# Patient Record
Sex: Female | Born: 1935
Health system: Southern US, Community
[De-identification: ages and names within clinical notes are randomized; demographics above are authoritative.]

## PROBLEM LIST (undated history)

## (undated) DIAGNOSIS — Z86010 Personal history of colonic polyps: Secondary | ICD-10-CM

## (undated) DIAGNOSIS — R7989 Other specified abnormal findings of blood chemistry: Secondary | ICD-10-CM

## (undated) DIAGNOSIS — B029 Zoster without complications: Secondary | ICD-10-CM

## (undated) DIAGNOSIS — Z972 Presence of dental prosthetic device (complete) (partial): Secondary | ICD-10-CM

## (undated) DIAGNOSIS — I2510 Atherosclerotic heart disease of native coronary artery without angina pectoris: Secondary | ICD-10-CM

## (undated) DIAGNOSIS — K219 Gastro-esophageal reflux disease without esophagitis: Secondary | ICD-10-CM

## (undated) DIAGNOSIS — K5790 Diverticulosis of intestine, part unspecified, without perforation or abscess without bleeding: Secondary | ICD-10-CM

## (undated) DIAGNOSIS — I219 Acute myocardial infarction, unspecified: Secondary | ICD-10-CM

## (undated) DIAGNOSIS — I4891 Unspecified atrial fibrillation: Secondary | ICD-10-CM

## (undated) DIAGNOSIS — I10 Essential (primary) hypertension: Secondary | ICD-10-CM

## (undated) DIAGNOSIS — I5032 Chronic diastolic (congestive) heart failure: Secondary | ICD-10-CM

## (undated) DIAGNOSIS — M1990 Unspecified osteoarthritis, unspecified site: Secondary | ICD-10-CM

## (undated) DIAGNOSIS — R945 Abnormal results of liver function studies: Secondary | ICD-10-CM

## (undated) DIAGNOSIS — E119 Type 2 diabetes mellitus without complications: Secondary | ICD-10-CM

## (undated) DIAGNOSIS — E785 Hyperlipidemia, unspecified: Secondary | ICD-10-CM

## (undated) HISTORY — DX: Zoster without complications: B02.9

## (undated) HISTORY — DX: Chronic diastolic (congestive) heart failure: I50.32

## (undated) HISTORY — DX: Personal history of colonic polyps: Z86.010

## (undated) HISTORY — DX: Type 2 diabetes mellitus without complications: E11.9

## (undated) HISTORY — DX: Diverticulosis of intestine, part unspecified, without perforation or abscess without bleeding: K57.90

## (undated) HISTORY — DX: Hypercalcemia: E83.52

## (undated) HISTORY — DX: Essential (primary) hypertension: I10

## (undated) HISTORY — DX: Hyperlipidemia, unspecified: E78.5

## (undated) HISTORY — DX: Abnormal results of liver function studies: R94.5

## (undated) HISTORY — PX: VAGINAL HYSTERECTOMY: SUR661

## (undated) HISTORY — DX: Other specified abnormal findings of blood chemistry: R79.89

## (undated) HISTORY — DX: Atherosclerotic heart disease of native coronary artery without angina pectoris: I25.10

---

## 1979-05-20 HISTORY — PX: SHOULDER OPEN ROTATOR CUFF REPAIR: SHX2407

## 1987-09-19 DIAGNOSIS — I219 Acute myocardial infarction, unspecified: Secondary | ICD-10-CM

## 1987-09-19 HISTORY — PX: CORONARY ANGIOPLASTY: SHX604

## 1987-09-19 HISTORY — DX: Acute myocardial infarction, unspecified: I21.9

## 2001-07-15 ENCOUNTER — Other Ambulatory Visit: Admission: RE | Admit: 2001-07-15 | Discharge: 2001-07-15 | Payer: Self-pay | Admitting: Family Medicine

## 2002-03-18 DIAGNOSIS — M810 Age-related osteoporosis without current pathological fracture: Secondary | ICD-10-CM

## 2004-04-06 ENCOUNTER — Ambulatory Visit (HOSPITAL_COMMUNITY): Admission: RE | Admit: 2004-04-06 | Discharge: 2004-04-06 | Payer: Self-pay | Admitting: *Deleted

## 2004-05-11 ENCOUNTER — Other Ambulatory Visit: Payer: Self-pay

## 2004-07-25 ENCOUNTER — Ambulatory Visit: Payer: Self-pay | Admitting: Family Medicine

## 2004-08-25 ENCOUNTER — Other Ambulatory Visit: Admission: RE | Admit: 2004-08-25 | Discharge: 2004-08-25 | Payer: Self-pay | Admitting: Family Medicine

## 2004-08-25 ENCOUNTER — Ambulatory Visit: Payer: Self-pay | Admitting: Family Medicine

## 2004-10-14 ENCOUNTER — Ambulatory Visit: Payer: Self-pay | Admitting: Family Medicine

## 2004-10-26 ENCOUNTER — Ambulatory Visit: Payer: Self-pay | Admitting: Family Medicine

## 2004-11-16 LAB — HM DEXA SCAN

## 2005-02-01 ENCOUNTER — Ambulatory Visit: Payer: Self-pay | Admitting: Family Medicine

## 2005-05-10 ENCOUNTER — Ambulatory Visit: Payer: Self-pay | Admitting: Family Medicine

## 2005-08-28 ENCOUNTER — Ambulatory Visit: Payer: Self-pay | Admitting: Family Medicine

## 2005-09-12 ENCOUNTER — Ambulatory Visit: Payer: Self-pay | Admitting: Family Medicine

## 2005-10-27 ENCOUNTER — Ambulatory Visit: Payer: Self-pay | Admitting: Family Medicine

## 2005-11-02 ENCOUNTER — Ambulatory Visit: Payer: Self-pay | Admitting: Family Medicine

## 2006-05-01 ENCOUNTER — Ambulatory Visit: Payer: Self-pay | Admitting: Family Medicine

## 2006-07-11 ENCOUNTER — Ambulatory Visit: Payer: Self-pay | Admitting: Family Medicine

## 2006-08-18 ENCOUNTER — Encounter: Payer: Self-pay | Admitting: Family Medicine

## 2006-08-18 LAB — CONVERTED CEMR LAB

## 2006-08-29 ENCOUNTER — Ambulatory Visit: Payer: Self-pay | Admitting: Family Medicine

## 2006-09-21 ENCOUNTER — Ambulatory Visit: Payer: Self-pay | Admitting: Family Medicine

## 2006-10-19 ENCOUNTER — Encounter: Payer: Self-pay | Admitting: Family Medicine

## 2006-10-19 LAB — CONVERTED CEMR LAB: Microalbumin U total vol: NORMAL mg/L

## 2006-11-02 ENCOUNTER — Ambulatory Visit: Payer: Self-pay | Admitting: Family Medicine

## 2006-11-02 LAB — CONVERTED CEMR LAB
ALT: 28 units/L (ref 0–40)
AST: 31 units/L (ref 0–37)
Basophils Absolute: 0.1 10*3/uL (ref 0.0–0.1)
CO2: 33 meq/L — ABNORMAL HIGH (ref 19–32)
Calcium: 9.9 mg/dL (ref 8.4–10.5)
Cholesterol: 156 mg/dL (ref 0–200)
Eosinophils Absolute: 0.2 10*3/uL (ref 0.0–0.6)
Eosinophils Relative: 3.9 % (ref 0.0–5.0)
GFR calc Af Amer: 153 mL/min
Glucose, Bld: 139 mg/dL — ABNORMAL HIGH (ref 70–99)
HCT: 42.1 % (ref 36.0–46.0)
Lymphocytes Relative: 33.3 % (ref 12.0–46.0)
MCHC: 34.6 g/dL (ref 30.0–36.0)
MCV: 89.8 fL (ref 78.0–100.0)
Neutro Abs: 2.8 10*3/uL (ref 1.4–7.7)
Neutrophils Relative %: 51 % (ref 43.0–77.0)
Phosphorus: 3.5 mg/dL (ref 2.3–4.6)
Platelets: 328 10*3/uL (ref 150–400)
Potassium: 4.1 meq/L (ref 3.5–5.1)
RBC: 4.69 M/uL (ref 3.87–5.11)
TSH: 3.43 microintl units/mL (ref 0.35–5.50)
Total CHOL/HDL Ratio: 3.6
Triglycerides: 192 mg/dL — ABNORMAL HIGH (ref 0–149)
Vit D, 1,25-Dihydroxy: 35 (ref 20–57)
WBC: 5.6 10*3/uL (ref 4.5–10.5)

## 2006-11-06 ENCOUNTER — Ambulatory Visit: Payer: Self-pay | Admitting: Family Medicine

## 2006-12-10 ENCOUNTER — Encounter: Payer: Self-pay | Admitting: Family Medicine

## 2006-12-10 DIAGNOSIS — I10 Essential (primary) hypertension: Secondary | ICD-10-CM

## 2006-12-10 DIAGNOSIS — E7800 Pure hypercholesterolemia, unspecified: Secondary | ICD-10-CM

## 2006-12-11 ENCOUNTER — Ambulatory Visit: Payer: Self-pay | Admitting: Family Medicine

## 2007-01-21 ENCOUNTER — Ambulatory Visit: Payer: Self-pay | Admitting: Family Medicine

## 2007-01-22 LAB — CONVERTED CEMR LAB: Hgb A1c MFr Bld: 6.5 % — ABNORMAL HIGH (ref 4.6–6.0)

## 2007-03-05 ENCOUNTER — Encounter: Payer: Self-pay | Admitting: Family Medicine

## 2007-06-10 ENCOUNTER — Encounter: Payer: Self-pay | Admitting: Family Medicine

## 2007-07-26 ENCOUNTER — Ambulatory Visit: Payer: Self-pay | Admitting: Family Medicine

## 2007-07-29 LAB — CONVERTED CEMR LAB
ALT: 14 units/L (ref 0–35)
AST: 18 units/L (ref 0–37)
Albumin: 4.3 g/dL (ref 3.5–5.2)
BUN: 12 mg/dL (ref 6–23)
Calcium: 10 mg/dL (ref 8.4–10.5)
Chloride: 100 meq/L (ref 96–112)
Creatinine, Ser: 0.5 mg/dL (ref 0.4–1.2)
GFR calc Af Amer: 153 mL/min
GFR calc non Af Amer: 127 mL/min
Hgb A1c MFr Bld: 6.3 % — ABNORMAL HIGH (ref 4.6–6.0)
Sodium: 142 meq/L (ref 135–145)
VLDL: 24 mg/dL (ref 0–40)

## 2007-09-02 ENCOUNTER — Ambulatory Visit: Payer: Self-pay | Admitting: Family Medicine

## 2007-09-04 ENCOUNTER — Telehealth: Payer: Self-pay | Admitting: Family Medicine

## 2007-09-20 ENCOUNTER — Telehealth: Payer: Self-pay | Admitting: Family Medicine

## 2007-10-03 ENCOUNTER — Encounter: Payer: Self-pay | Admitting: Family Medicine

## 2007-10-18 ENCOUNTER — Encounter (INDEPENDENT_AMBULATORY_CARE_PROVIDER_SITE_OTHER): Payer: Self-pay | Admitting: *Deleted

## 2007-10-18 ENCOUNTER — Encounter: Payer: Self-pay | Admitting: Family Medicine

## 2007-10-22 ENCOUNTER — Encounter (INDEPENDENT_AMBULATORY_CARE_PROVIDER_SITE_OTHER): Payer: Self-pay | Admitting: *Deleted

## 2007-10-23 ENCOUNTER — Encounter (INDEPENDENT_AMBULATORY_CARE_PROVIDER_SITE_OTHER): Payer: Self-pay | Admitting: *Deleted

## 2007-11-25 ENCOUNTER — Telehealth: Payer: Self-pay | Admitting: Family Medicine

## 2007-12-11 ENCOUNTER — Encounter: Payer: Self-pay | Admitting: Family Medicine

## 2008-03-02 ENCOUNTER — Ambulatory Visit: Payer: Self-pay | Admitting: Family Medicine

## 2008-03-03 LAB — CONVERTED CEMR LAB
AST: 19 units/L (ref 0–37)
Creatinine,U: 19.5 mg/dL
Microalb Creat Ratio: 56.4 mg/g — ABNORMAL HIGH (ref 0.0–30.0)
Microalb, Ur: 1.1 mg/dL (ref 0.0–1.9)
VLDL: 20 mg/dL (ref 0–40)

## 2008-04-15 ENCOUNTER — Emergency Department: Payer: Self-pay | Admitting: Emergency Medicine

## 2008-04-15 ENCOUNTER — Telehealth: Payer: Self-pay | Admitting: Family Medicine

## 2008-04-16 ENCOUNTER — Telehealth: Payer: Self-pay | Admitting: Family Medicine

## 2008-04-18 ENCOUNTER — Emergency Department: Payer: Self-pay | Admitting: Emergency Medicine

## 2008-04-22 ENCOUNTER — Emergency Department: Payer: Self-pay | Admitting: Emergency Medicine

## 2008-04-29 ENCOUNTER — Emergency Department: Payer: Self-pay | Admitting: Emergency Medicine

## 2008-05-13 ENCOUNTER — Emergency Department: Payer: Self-pay | Admitting: Emergency Medicine

## 2008-08-26 ENCOUNTER — Ambulatory Visit: Payer: Self-pay | Admitting: Family Medicine

## 2008-08-27 LAB — CONVERTED CEMR LAB
ALT: 15 units/L (ref 0–35)
Alkaline Phosphatase: 36 units/L — ABNORMAL LOW (ref 39–117)
BUN: 14 mg/dL (ref 6–23)
Bilirubin, Direct: 0.1 mg/dL (ref 0.0–0.3)
Calcium: 10.2 mg/dL (ref 8.4–10.5)
Creatinine, Ser: 0.5 mg/dL (ref 0.4–1.2)
Eosinophils Relative: 3.1 % (ref 0.0–5.0)
GFR calc Af Amer: 153 mL/min
HCT: 40.6 % (ref 36.0–46.0)
HDL: 43 mg/dL (ref >39.0)
Monocytes Relative: 10.1 % (ref 3.0–12.0)
Neutrophils Relative %: 50.9 % (ref 43.0–77.0)
Phosphorus: 4.2 mg/dL (ref 2.3–4.6)
Platelets: 289 10*3/uL (ref 150–400)
RBC: 4.49 M/uL (ref 3.87–5.11)
Total CHOL/HDL Ratio: 3.3
Total Protein: 8.2 g/dL (ref 6.0–8.3)
VLDL: 27 mg/dL (ref 0–40)
WBC: 5.6 10*3/uL (ref 4.5–10.5)

## 2008-09-02 ENCOUNTER — Ambulatory Visit: Payer: Self-pay | Admitting: Family Medicine

## 2008-09-02 DIAGNOSIS — Z860100 Personal history of colon polyps, unspecified: Secondary | ICD-10-CM | POA: Insufficient documentation

## 2008-09-18 DIAGNOSIS — Z860101 Personal history of adenomatous and serrated colon polyps: Secondary | ICD-10-CM

## 2008-09-18 DIAGNOSIS — Z86010 Personal history of colonic polyps: Secondary | ICD-10-CM

## 2008-09-18 HISTORY — DX: Personal history of adenomatous and serrated colon polyps: Z86.0101

## 2008-09-18 HISTORY — DX: Personal history of colonic polyps: Z86.010

## 2008-10-09 ENCOUNTER — Encounter: Payer: Self-pay | Admitting: Family Medicine

## 2008-10-15 ENCOUNTER — Encounter (INDEPENDENT_AMBULATORY_CARE_PROVIDER_SITE_OTHER): Payer: Self-pay | Admitting: *Deleted

## 2008-10-20 ENCOUNTER — Telehealth: Payer: Self-pay | Admitting: Family Medicine

## 2008-10-21 ENCOUNTER — Encounter: Payer: Self-pay | Admitting: Family Medicine

## 2008-10-25 ENCOUNTER — Encounter: Payer: Self-pay | Admitting: Family Medicine

## 2008-11-12 ENCOUNTER — Ambulatory Visit: Payer: Self-pay | Admitting: Family Medicine

## 2008-11-12 DIAGNOSIS — K5732 Diverticulitis of large intestine without perforation or abscess without bleeding: Secondary | ICD-10-CM | POA: Insufficient documentation

## 2008-11-12 LAB — CONVERTED CEMR LAB
Bilirubin Urine: NEGATIVE
Blood in Urine, dipstick: NEGATIVE
Glucose, Urine, Semiquant: NEGATIVE
Protein, U semiquant: 30
pH: 6

## 2008-11-16 ENCOUNTER — Ambulatory Visit: Payer: Self-pay | Admitting: Family Medicine

## 2008-11-17 LAB — CONVERTED CEMR LAB
AST: 31 units/L (ref 0–37)
Alkaline Phosphatase: 43 units/L (ref 39–117)
BUN: 10 mg/dL (ref 6–23)
Basophils Absolute: 0 10*3/uL (ref 0.0–0.1)
Bilirubin, Direct: 0.1 mg/dL (ref 0.0–0.3)
Chloride: 99 meq/L (ref 96–112)
Eosinophils Absolute: 0.1 10*3/uL (ref 0.0–0.7)
Eosinophils Relative: 0.6 % (ref 0.0–5.0)
GFR calc non Af Amer: 73 mL/min
MCV: 89.2 fL (ref 78.0–100.0)
Neutrophils Relative %: 77.6 % — ABNORMAL HIGH (ref 43.0–77.0)
Platelets: 342 10*3/uL (ref 150–400)
Potassium: 4.1 meq/L (ref 3.5–5.1)
Sodium: 140 meq/L (ref 135–145)
Total Bilirubin: 0.9 mg/dL (ref 0.3–1.2)
WBC: 9.9 10*3/uL (ref 4.5–10.5)

## 2008-11-18 ENCOUNTER — Ambulatory Visit: Payer: Self-pay | Admitting: Internal Medicine

## 2008-11-18 ENCOUNTER — Encounter (INDEPENDENT_AMBULATORY_CARE_PROVIDER_SITE_OTHER): Payer: Self-pay | Admitting: *Deleted

## 2008-11-19 ENCOUNTER — Telehealth: Payer: Self-pay | Admitting: Family Medicine

## 2008-11-20 ENCOUNTER — Telehealth: Payer: Self-pay | Admitting: Family Medicine

## 2008-11-24 ENCOUNTER — Encounter: Payer: Self-pay | Admitting: Family Medicine

## 2008-11-25 ENCOUNTER — Ambulatory Visit: Payer: Self-pay | Admitting: Family Medicine

## 2008-11-26 LAB — FECAL OCCULT BLOOD, GUAIAC: Fecal Occult Blood: NEGATIVE

## 2008-11-27 ENCOUNTER — Ambulatory Visit: Payer: Self-pay | Admitting: Family Medicine

## 2008-12-02 ENCOUNTER — Encounter: Payer: Self-pay | Admitting: Family Medicine

## 2009-02-26 ENCOUNTER — Ambulatory Visit: Payer: Self-pay | Admitting: Family Medicine

## 2009-03-01 LAB — CONVERTED CEMR LAB
ALT: 13 units/L (ref 0–35)
Alkaline Phosphatase: 32 units/L — ABNORMAL LOW (ref 39–117)
Bilirubin, Direct: 0.1 mg/dL (ref 0.0–0.3)
CO2: 30 meq/L (ref 19–32)
Calcium: 9.8 mg/dL (ref 8.4–10.5)
Chloride: 106 meq/L (ref 96–112)
Potassium: 3.7 meq/L (ref 3.5–5.1)
Sodium: 142 meq/L (ref 135–145)
Total Bilirubin: 0.8 mg/dL (ref 0.3–1.2)
Total Protein: 7.3 g/dL (ref 6.0–8.3)

## 2009-03-02 ENCOUNTER — Ambulatory Visit: Payer: Self-pay | Admitting: Family Medicine

## 2009-03-02 DIAGNOSIS — E559 Vitamin D deficiency, unspecified: Secondary | ICD-10-CM

## 2009-03-29 ENCOUNTER — Encounter: Payer: Self-pay | Admitting: Family Medicine

## 2009-04-01 ENCOUNTER — Telehealth: Payer: Self-pay | Admitting: Family Medicine

## 2009-05-19 ENCOUNTER — Ambulatory Visit: Payer: Self-pay | Admitting: Family Medicine

## 2009-05-20 ENCOUNTER — Encounter: Payer: Self-pay | Admitting: Family Medicine

## 2009-05-31 ENCOUNTER — Ambulatory Visit: Payer: Self-pay | Admitting: Unknown Physician Specialty

## 2009-05-31 ENCOUNTER — Encounter: Payer: Self-pay | Admitting: Family Medicine

## 2009-05-31 LAB — HM COLONOSCOPY

## 2009-07-28 ENCOUNTER — Encounter: Payer: Self-pay | Admitting: Family Medicine

## 2009-08-17 ENCOUNTER — Telehealth: Payer: Self-pay | Admitting: Family Medicine

## 2009-09-01 ENCOUNTER — Ambulatory Visit: Payer: Self-pay | Admitting: Family Medicine

## 2009-09-02 LAB — CONVERTED CEMR LAB
ALT: 11 units/L (ref 0–35)
AST: 19 units/L (ref 0–37)
Albumin: 4.2 g/dL (ref 3.5–5.2)
Basophils Absolute: 0 10*3/uL (ref 0.0–0.1)
Creatinine, Ser: 0.7 mg/dL (ref 0.4–1.2)
Eosinophils Absolute: 0.2 10*3/uL (ref 0.0–0.7)
Glucose, Bld: 112 mg/dL — ABNORMAL HIGH (ref 70–99)
HCT: 38.7 % (ref 36.0–46.0)
HDL: 47.6 mg/dL (ref >39.00)
LDL Cholesterol: 72 mg/dL (ref 0–99)
Lymphs Abs: 1.6 10*3/uL (ref 0.7–4.0)
MCHC: 33.2 g/dL (ref 30.0–36.0)
Monocytes Relative: 10.3 % (ref 3.0–12.0)
Neutro Abs: 3.9 10*3/uL (ref 1.4–7.7)
Phosphorus: 3.7 mg/dL (ref 2.3–4.6)
Platelets: 305 10*3/uL (ref 150.0–400.0)
Potassium: 4 meq/L (ref 3.5–5.1)
RDW: 12.9 % (ref 11.5–14.6)
Sodium: 139 meq/L (ref 135–145)
TSH: 3.25 microintl units/mL (ref 0.35–5.50)
VLDL: 22 mg/dL (ref 0.0–40.0)

## 2009-09-06 ENCOUNTER — Ambulatory Visit: Payer: Self-pay | Admitting: Family Medicine

## 2009-09-06 DIAGNOSIS — T6191XA Toxic effect of unspecified seafood, accidental (unintentional), initial encounter: Secondary | ICD-10-CM

## 2009-09-06 DIAGNOSIS — T61781A Other shellfish poisoning, accidental (unintentional), initial encounter: Secondary | ICD-10-CM | POA: Insufficient documentation

## 2009-09-13 LAB — CONVERTED CEMR LAB: Microalb, Ur: 0.4 mg/dL (ref 0.0–1.9)

## 2009-09-24 ENCOUNTER — Encounter: Payer: Self-pay | Admitting: Family Medicine

## 2009-10-06 ENCOUNTER — Encounter: Payer: Self-pay | Admitting: Family Medicine

## 2009-10-11 ENCOUNTER — Encounter: Payer: Self-pay | Admitting: Family Medicine

## 2009-10-15 ENCOUNTER — Telehealth: Payer: Self-pay | Admitting: Family Medicine

## 2009-10-18 ENCOUNTER — Encounter (INDEPENDENT_AMBULATORY_CARE_PROVIDER_SITE_OTHER): Payer: Self-pay | Admitting: *Deleted

## 2010-03-08 ENCOUNTER — Ambulatory Visit: Payer: Self-pay | Admitting: Family Medicine

## 2010-03-09 LAB — CONVERTED CEMR LAB
AST: 18 units/L (ref 0–37)
BUN: 10 mg/dL (ref 6–23)
Basophils Relative: 0.7 % (ref 0.0–3.0)
Cholesterol: 140 mg/dL (ref 0–200)
Creatinine, Ser: 0.4 mg/dL (ref 0.4–1.2)
Direct LDL: 77.2 mg/dL
Eosinophils Absolute: 0.2 10*3/uL (ref 0.0–0.7)
Eosinophils Relative: 3.3 % (ref 0.0–5.0)
GFR calc non Af Amer: 149.57 mL/min (ref >60)
Glucose, Bld: 141 mg/dL — ABNORMAL HIGH (ref 70–99)
HDL: 42.8 mg/dL (ref >39.00)
Hemoglobin: 12.5 g/dL (ref 12.0–15.0)
MCHC: 33.3 g/dL (ref 30.0–36.0)
MCV: 91 fL (ref 78.0–100.0)
Monocytes Absolute: 0.5 10*3/uL (ref 0.1–1.0)
Neutro Abs: 3.2 10*3/uL (ref 1.4–7.7)
Neutrophils Relative %: 55.9 % (ref 43.0–77.0)
Phosphorus: 4.2 mg/dL (ref 2.3–4.6)
RBC: 4.11 M/uL (ref 3.87–5.11)
Sodium: 143 meq/L (ref 135–145)
Total Bilirubin: 0.5 mg/dL (ref 0.3–1.2)
Total CHOL/HDL Ratio: 3
Triglycerides: 204 mg/dL — ABNORMAL HIGH (ref 0.0–149.0)
VLDL: 40.8 mg/dL — ABNORMAL HIGH (ref 0.0–40.0)
WBC: 5.7 10*3/uL (ref 4.5–10.5)

## 2010-03-16 ENCOUNTER — Telehealth: Payer: Self-pay | Admitting: Family Medicine

## 2010-03-16 ENCOUNTER — Ambulatory Visit: Payer: Self-pay | Admitting: Family Medicine

## 2010-03-16 DIAGNOSIS — R609 Edema, unspecified: Secondary | ICD-10-CM | POA: Insufficient documentation

## 2010-03-22 ENCOUNTER — Encounter: Payer: Self-pay | Admitting: Family Medicine

## 2010-04-05 ENCOUNTER — Ambulatory Visit: Payer: Self-pay | Admitting: Family Medicine

## 2010-06-06 ENCOUNTER — Encounter (INDEPENDENT_AMBULATORY_CARE_PROVIDER_SITE_OTHER): Payer: Self-pay | Admitting: *Deleted

## 2010-06-06 ENCOUNTER — Ambulatory Visit: Payer: Self-pay | Admitting: Family Medicine

## 2010-06-10 ENCOUNTER — Encounter (INDEPENDENT_AMBULATORY_CARE_PROVIDER_SITE_OTHER): Payer: Self-pay | Admitting: *Deleted

## 2010-06-10 ENCOUNTER — Ambulatory Visit: Payer: Self-pay | Admitting: Gastroenterology

## 2010-06-13 LAB — CONVERTED CEMR LAB
Alkaline Phosphatase: 41 units/L (ref 39–117)
CO2: 24 meq/L (ref 19–32)
Creatinine, Ser: 0.6 mg/dL (ref 0.4–1.2)
Eosinophils Relative: 3 % (ref 0.0–5.0)
GFR calc non Af Amer: 105.83 mL/min (ref >60)
Glucose, Bld: 179 mg/dL — ABNORMAL HIGH (ref 70–99)
HCT: 36.8 % (ref 36.0–46.0)
Hemoglobin: 12.4 g/dL (ref 12.0–15.0)
Lymphs Abs: 1.5 10*3/uL (ref 0.7–4.0)
MCV: 89.5 fL (ref 78.0–100.0)
Monocytes Absolute: 0.6 10*3/uL (ref 0.1–1.0)
Monocytes Relative: 9.2 % (ref 3.0–12.0)
Neutro Abs: 4.5 10*3/uL (ref 1.4–7.7)
RDW: 14.4 % (ref 11.5–14.6)
Sed Rate: 18 mm/hr (ref 0–22)
Total Bilirubin: 0.3 mg/dL (ref 0.3–1.2)
WBC: 7 10*3/uL (ref 4.5–10.5)

## 2010-06-22 ENCOUNTER — Ambulatory Visit: Payer: Self-pay | Admitting: Gastroenterology

## 2010-07-07 ENCOUNTER — Ambulatory Visit: Payer: Self-pay | Admitting: Family Medicine

## 2010-08-18 LAB — HM DIABETES EYE EXAM: HM Diabetic Eye Exam: NORMAL

## 2010-08-23 ENCOUNTER — Telehealth: Payer: Self-pay | Admitting: Family Medicine

## 2010-09-13 ENCOUNTER — Ambulatory Visit: Payer: Self-pay | Admitting: Family Medicine

## 2010-09-21 LAB — CONVERTED CEMR LAB
Creatinine,U: 20.6 mg/dL
Microalb Creat Ratio: 9.2 mg/g
Microalb, Ur: 1.9 mg/dL

## 2010-09-23 ENCOUNTER — Telehealth: Payer: Self-pay | Admitting: Family Medicine

## 2010-09-26 ENCOUNTER — Other Ambulatory Visit: Payer: Self-pay | Admitting: Family Medicine

## 2010-09-26 ENCOUNTER — Encounter: Payer: Self-pay | Admitting: Family Medicine

## 2010-09-26 ENCOUNTER — Ambulatory Visit
Admission: RE | Admit: 2010-09-26 | Discharge: 2010-09-26 | Payer: Self-pay | Source: Home / Self Care | Attending: Family Medicine | Admitting: Family Medicine

## 2010-09-26 DIAGNOSIS — D518 Other vitamin B12 deficiency anemias: Secondary | ICD-10-CM | POA: Insufficient documentation

## 2010-09-26 LAB — B12 AND FOLATE PANEL
Folate: 9.5 ng/mL
Vitamin B-12: 49 pg/mL — ABNORMAL LOW (ref 211–911)

## 2010-09-26 LAB — LIPID PANEL
Cholesterol: 139 mg/dL (ref 0–200)
HDL: 43.4 mg/dL (ref >39.00)
LDL Cholesterol: 75 mg/dL (ref 0–99)
Total CHOL/HDL Ratio: 3
Triglycerides: 101 mg/dL (ref 0.0–149.0)
VLDL: 20.2 mg/dL (ref 0.0–40.0)

## 2010-09-26 LAB — RENAL FUNCTION PANEL
Albumin: 4.2 g/dL (ref 3.5–5.2)
BUN: 11 mg/dL (ref 6–23)
CO2: 28 mEq/L (ref 19–32)
Calcium: 10 mg/dL (ref 8.4–10.5)
Chloride: 103 mEq/L (ref 96–112)
Creatinine, Ser: 0.7 mg/dL (ref 0.4–1.2)
GFR: 92.72 mL/min (ref >60.00)
Glucose, Bld: 116 mg/dL — ABNORMAL HIGH (ref 70–99)
Phosphorus: 3.5 mg/dL (ref 2.3–4.6)
Potassium: 4.3 mEq/L (ref 3.5–5.1)
Sodium: 142 mEq/L (ref 135–145)

## 2010-09-26 LAB — HEMOGLOBIN A1C: Hgb A1c MFr Bld: 7 % — ABNORMAL HIGH (ref 4.6–6.5)

## 2010-09-26 LAB — AST: AST: 18 U/L (ref 0–37)

## 2010-09-26 LAB — ALT: ALT: 12 U/L (ref 0–35)

## 2010-09-27 ENCOUNTER — Ambulatory Visit
Admission: RE | Admit: 2010-09-27 | Discharge: 2010-09-27 | Payer: Self-pay | Source: Home / Self Care | Attending: Family Medicine | Admitting: Family Medicine

## 2010-10-05 ENCOUNTER — Ambulatory Visit
Admission: RE | Admit: 2010-10-05 | Discharge: 2010-10-05 | Payer: Self-pay | Source: Home / Self Care | Attending: Family Medicine | Admitting: Family Medicine

## 2010-10-13 ENCOUNTER — Ambulatory Visit
Admission: RE | Admit: 2010-10-13 | Discharge: 2010-10-13 | Payer: Self-pay | Source: Home / Self Care | Attending: Family Medicine | Admitting: Family Medicine

## 2010-10-18 NOTE — Assessment & Plan Note (Signed)
Summary: 8:45 Lower abdominal pain /lsf   Vital Signs:  Patient Profile:   77 Years Old Female Height:     61.25 inches (157.48 cm) Weight:      105.13 pounds Temp:     98.6 degrees F oral Pulse rate:   76 / minute Pulse rhythm:   regular BP sitting:   102 / 82  (left arm) Cuff size:   regular  Vitals Entered By: Delilah Shan (November 12, 2008 8:28 AM)                 Chief Complaint:  Lower abdominal pain.  History of Present Illness: Lower abdominal pain 2 days ago, starts on left side and radiates to right. Feels like sharp cramping pain. No change with movement or eating. Pain improves at night.  Some diarrhea, but not much different than ususal. Occ nausea. No blood in stool seen.  No fever. No dysuria, no change infrequency.   No OTC meds. No new medications.  No similar past issues like this.     Current Allergies (reviewed today): ! * ISOSORBIDE ! PROCARDIA ! * NOVICAINE ! FOSAMAX  Past Medical History:    Reviewed history from 09/02/2008 and no changes required:       DM2        hypercalcemia       OP       hyperlipidemia       HTN       CAD        past- zoster  Past Surgical History:    Reviewed history from 12/10/2006 and no changes required:       Hysterectomy, ovaries intact       R shoulder shoulder       Exercise Stress Cardiolite NML EF 62% 5/00       Dexa - OP of spine 7/03, slight improvement 3/06       Renal Artery Stenossi, right 6/05       CT angiogram 6/05       colonoscopy - polyp 7/05, diverticulosis - recheck in 3 years       Angiogram - kidneys bilateral 40-50% stenosis 7/05       CP myoview stress NML EF 80% 8/05       Myoview - NML 2007   Social History:    Reviewed history from 09/02/2008 and no changes required:       Former Smoker       no alcohol at all       is very active     Review of Systems      See HPI   Physical Exam  General:     Well-developed,well-nourished,in no acute distress;  alert,appropriate and cooperative throughout examination Mouth:     Oral mucosa and oropharynx without lesions or exudates.  MMM. Lungs:     Normal respiratory effort, chest expands symmetrically. Lungs are clear to auscultation, no crackles or wheezes. Heart:     Normal rate and regular rhythm. S1 and S2 normal without gallop, murmur, click, rub or other extra sounds. Abdomen:     ttp central and left LLQ, mild guarding and rebound, no inguinal hernia, no hepatomegaly, and no splenomegaly.   No CVA tenderness    Impression & Recommendations:  Problem # 1:  DIVERTICULITIS, COLON (ICD-562.11) Doubt urinary tract infection because no urinary symptoms ... urine contaminated.  She was told she had diverticuli on colonoscopy  in 2005. Most consistent with mild diverticulitis. Treat  with cipro and flagyl. Close foollow up given age. If not improving will consider labs and possible CT of abdomen.  Orders: UA Dipstick W/ Micro (manual) ([id number])   Complete Medication List: 1)  Norvasc 5 Mg Tabs (Amlodipine besylate) .... One by mouth at bedtime 2)  Catapres-tts-2 0.2 Mg/24hr Ptwk (Clonidine hcl) .... One patch as directed once weekly 3)  Zocor 20 Mg Tabs (Simvastatin) .Marland Kitchen.. 1 by mouth once daily 4)  Normodyne  .Marland KitchenMarland Kitchen. 1 by mouth once daily 5)  Aspirin 325 Mg Tabs (Aspirin) .Marland Kitchen.. 1 by mouth once daily 6)  Calcium Carbonate 600 Mg Tabs (Calcium carbonate) .... 2 by mouth once daily 7)  Metformin Hcl 1000 Mg Tabs (Metformin hcl) .Marland Kitchen.. 1 by mouth two times a day 8)  Capozide 25-15 Mg Tabs (Captopril-hydrochlorothiazide) .Marland Kitchen.. 1 by mouth three times a day 9)  Miacalcin 200 Unit/ml Soln (Calcitonin (salmon)) .Marland Kitchen.. 1 spray in one nostril daily (switch nostrils every other day) 10)  Ciprofloxacin Hcl 750 Mg Tabs (Ciprofloxacin hcl) .Marland Kitchen.. 1 tab by mouth two times a day x 7 days 11)  Metronidazole 500 Mg Tabs (Metronidazole) .Marland Kitchen.. 1 tab by mouth every 6 hours x 7 days   Patient Instructions: 1)  Please  schedule a follow-up appointment next Monday.  2)  Call if fever, worsening pain.    Prescriptions: METRONIDAZOLE 500 MG TABS (METRONIDAZOLE) 1 tab by mouth every 6 hours x 7 days  #28 x 0   Entered and Authorized by:   Kerby Nora MD   Signed by:   Kerby Nora MD on 11/12/2008   Method used:   Electronically to        [facility] (retail)       [address]       [place], [place]  [address]       Ph: [phone]       Fax: [phone]   RxID:   [id number] CIPROFLOXACIN HCL 750 MG TABS (CIPROFLOXACIN HCL) 1 tab by mouth two times a day x 7 days  #14 x 0   Entered and Authorized by:   Kerby Nora MD   Signed by:   Kerby Nora MD on 11/12/2008   Method used:   Electronically to        [facility] (retail)       [address]       [place], [place]  [address]       Ph: [phone]       Fax: [phone]   RxID:   [id number]   Current Allergies (reviewed today): ! * ISOSORBIDE ! PROCARDIA ! * NOVICAINE ! FOSAMAX Current Medications (including changes made in today's visit):  NORVASC 5 MG TABS (AMLODIPINE BESYLATE) one by mouth at bedtime CATAPRES-TTS-2 0.2 MG/24HR PTWK (CLONIDINE HCL) one patch as directed once weekly ZOCOR 20 MG TABS (SIMVASTATIN) 1 by mouth once daily * NORMODYNE 1 by mouth once daily ASPIRIN 325 MG TABS (ASPIRIN) 1 by mouth once daily CALCIUM CARBONATE 600 MG TABS (CALCIUM CARBONATE) 2 by mouth once daily METFORMIN HCL 1000 MG TABS (METFORMIN HCL) 1 by mouth two times a day CAPOZIDE 25-15 MG  TABS (CAPTOPRIL-HYDROCHLOROTHIAZIDE) 1 by mouth three times a day MIACALCIN 200 UNIT/ML SOLN (CALCITONIN (SALMON)) 1 spray in one nostril daily (switch nostrils every other day) CIPROFLOXACIN HCL 750 MG TABS (CIPROFLOXACIN HCL) 1 tab by mouth two  times a day x 7 days METRONIDAZOLE 500 MG TABS (METRONIDAZOLE) 1 tab by mouth every 6 hours x 7 days   Laboratory Results   Urine Tests  Date/Time  Received: November 12, 2008 8:53 AM  Date/Time Reported: November 12, 2008 8:53 AM   Routine Urinalysis   Color: yellow Appearance: Hazy Glucose: negative   (Normal Range: Negative) Bilirubin: negative   (Normal Range: Negative) Ketone: negative   (Normal Range: Negative) Spec. Gravity: 1.015   (Normal Range: 1.003-1.035) Blood: negative   (Normal Range: Negative) pH: 6.0   (Normal Range: 5.0-8.0) Protein: 30   (Normal Range: Negative) Urobilinogen: 0.2   (Normal Range: 0-1) Nitrite: negative   (Normal Range: Negative) Leukocyte Esterace: large   (Normal Range: Negative)  Urine Microscopic WBC/HPF: tntc RBC/HPF: 6-10 Epithelial/HPF: many    Comments: Contaminated sample

## 2010-10-18 NOTE — Letter (Signed)
Summary: [facility]   Imported By: Lanelle Bal 10/16/2009 08:17:09  _____________________________________________________________________  External Attachment:    Type:   Image     Comment:   External Document  Appended Document: [facility]    Clinical Lists Changes  Observations: Added new observation of DMEYEEXAMNXT: 10/2010 (10/17/2009 21:02) Added new observation of DMEYEEXMRES: normal (10/06/2009 21:02) Added new observation of EYE EXAM BY: Dr Dellie Burns (10/06/2009 21:02) Added new observation of DIAB EYE EX: normal (10/06/2009 21:02)        Diabetes Management Exam:    Eye Exam:       Eye Exam done elsewhere          Date: 10/06/2009          Results: normal          Done by: Dr Dellie Burns

## 2010-10-18 NOTE — Progress Notes (Signed)
Summary: ? Pneumovax  Phone Note Call from Patient   Caller: Daughter, Lugene Call For: Judith Part MD Summary of Call: I know Mom was in for an OV this a.m.  I meant to ask if she needs a Pneumovax update.  If she does, I will give it to her and add it to this visit if it's okay. Initial call taken by: Delilah Shan CMA Duncan Dull),  March 16, 2010 11:06 AM  Follow-up for Phone Call        she had her last pneumovax at 77 years old -- so she does not need any more of them (after 58 you never need another )  I will update you if guidelines change Follow-up by: Judith Part MD,  March 16, 2010 11:16 AM  Additional Follow-up for Phone Call Additional follow up Details #1::        I'm so sorry, I thought it had been longer than that and I didn't know it was no longer needed after 65.  Lugene Fuquay CMA Duncan Dull)  March 16, 2010 11:31 AM   no problem-- MT  Additional Follow-up by: Judith Part MD,  March 16, 2010 11:35 AM

## 2010-10-18 NOTE — Letter (Signed)
Summary: New Patient letter  [facility]  [address] [place], [place] [address]   Phone: [phone]  Fax: [phone]       06/06/2010 MRN: [id number]  [facility] [address] [place], [place]  [address]  Dear Bianca Mueller,  Welcome to the Gastroenterology Division at [facility].    You are scheduled to see Dr.  Christella Hartigan on 06-10-10 at 2:30p.m. on the 3rd floor at [facility], [address]  We ask that you try to arrive at our office 15 minutes prior to your appointment time to allow for check-in.  We would like you to complete the enclosed self-administered evaluation form prior to your visit and bring it with you on the day of your appointment.  We will review it with you.  Also, please bring a complete list of all your medications or, if you prefer, bring the medication bottles and we will list them.  Please bring your insurance card so that we may make a copy of it.  If your insurance requires a referral to see a specialist, please bring your referral form from your primary care physician.  Co-payments are due at the time of your visit and may be paid by cash, check or credit card.     Your office visit will consist of a consult with your physician (includes a physical exam), any laboratory testing he/she may order, scheduling of any necessary diagnostic testing (e.g. x-ray, ultrasound, CT-scan), and scheduling of a procedure (e.g. Endoscopy, Colonoscopy) if required.  Please allow enough time on your schedule to allow for any/all of these possibilities.    If you cannot keep your appointment, please call [phone] to cancel or reschedule prior to your appointment date.  This allows [place] the opportunity to schedule an appointment for another patient in need of care.  If you do not cancel or reschedule by 5 p.m. the business day prior to your appointment date, you will be charged a $50.00 late cancellation/no-show fee.    Thank you for choosing  [facility] for your medical needs.  We appreciate the opportunity to care for you.  Please visit [place] at our website  to learn more about our practice.                     Sincerely,                                                             The Gastroenterology Division

## 2010-10-18 NOTE — Miscellaneous (Signed)
Summary: rx  Clinical Lists Changes  Medications: Changed medication from NEXIUM 40 MG CPDR (ESOMEPRAZOLE MAGNESIUM) Take 1 capsule by mouth once a day to NEXIUM 40 MG CPDR (ESOMEPRAZOLE MAGNESIUM) Take 1 capsule by mouth once a day - Signed Rx of NEXIUM 40 MG CPDR (ESOMEPRAZOLE MAGNESIUM) Take 1 capsule by mouth once a day;  #30 x 11;  Signed;  Entered by: Rachael Fee MD;  Authorized by: Rachael Fee MD;  Method used: Electronically to [facility] [address] [place], [place], [place]  [address], Ph: [phone], Fax: [phone]    Prescriptions: NEXIUM 40 MG CPDR (ESOMEPRAZOLE MAGNESIUM) Take 1 capsule by mouth once a day  #30 x 11   Entered and Authorized by:   Rachael Fee MD   Signed by:   Rachael Fee MD on 06/22/2010   Method used:   Electronically to        [facility] (retail)       [address]       [place], [place]  [address]       Ph: [phone]       Fax: [phone]   RxID:   [id number]

## 2010-10-18 NOTE — Progress Notes (Signed)
Summary: Nitrostat  Phone Note From Pharmacy   Caller: [facility] Call For: Dr. Milinda Antis  Summary of Call: (Lugene's Mom)  Patient and her husband have kept a prescription of Nitrostat available because both have had heart issues in the past and have been advised to keep it available.   They keep only one Rx. because neither of them have had to use it.  I suppose the patient has neglected to report this on her meds list.  It has been prescribed under the patient's husband's name in the past.  It is time for it to be renewed and the patient is asking that it be prescribed to her this time because her husband is in the "donut hole".  [facility] [address] Initial call taken by: Delilah Shan CMA Duncan Dull),  August 23, 2010 10:49 AM  Follow-up for Phone Call        px written on EMR for call in  Follow-up by: Judith Part MD,  August 23, 2010 11:05 AM    New/Updated Medications: NITROSTAT 0.4 MG SUBL (NITROGLYCERIN) 1 under tounge as needed chest pain  if not improved seek medical attention at ER Prescriptions: NITROSTAT 0.4 MG SUBL (NITROGLYCERIN) 1 under tounge as needed chest pain  if not improved seek medical attention at ER  #15 x 0   Entered by:   Delilah Shan CMA (AAMA)   Authorized by:   Judith Part MD   Signed by:   Delilah Shan CMA (AAMA) on 08/23/2010   Method used:   [facility]ronically to        [facility] [address])       [address]       [place], [place]  [address]       Ph: [phone]       Fax: [phone]   RxID:   [id number]

## 2010-10-18 NOTE — Assessment & Plan Note (Signed)
History of Present Illness Visit Type: consult  Primary GI MD: Rob Bunting MD Primary Provider: Roxy Manns, MD Requesting Provider: Roxy Manns, MD Chief Complaint: vomiting  History of Present Illness:     very pleasant 77 year old woman who is here with her daughter today.  she  has had vomiting for the past six months.  Occured about once a month, but now every 2 weeks.  No associated pains.  That days food will come out, no blood.  Feels better after one emesis.   Usually will be after meal, no particular foods.  She does not eat much meat. Has no taste for foods anymore.    She has may have lost a "little" weight  in past 11month.  She does not get pyrosis.  She takes zantac nightly for 6 months.  Never had an EGD.  She takes an ASA once a daily.  No other NSAIDs.           Current Medications (verified): 1)  Norvasc 5 Mg Tabs (Amlodipine Besylate) .... One By Mouth At Bedtime 2)  Zocor 20 Mg Tabs (Simvastatin) .Marland Kitchen.. 1 By Mouth Once Daily 3)  Aspirin 325 Mg Tabs (Aspirin) .Marland Kitchen.. 1 By Mouth Once Daily 4)  Calcium Carbonate 600 Mg Tabs (Calcium Carbonate) .... 2 By Mouth Once Daily 5)  Metformin Hcl 1000 Mg Tabs (Metformin Hcl) .Marland Kitchen.. 1 By Mouth Two Times A Day 6)  Capozide 25-15 Mg  Tabs (Captopril-Hydrochlorothiazide) .Marland Kitchen.. 1 By Mouth Three Times A Day 7)  Miacalcin 200 Unit/ml Soln (Calcitonin (Salmon)) .Marland Kitchen.. 1 Spray in One Nostril Daily (Switch Nostrils Every Other Day) 8)  Vitamin D 50,000 Iu .Marland Kitchen.. 1 By Mouth Q Week For 12 Weeks 9)  Labetalol Hcl 100 Mg Tabs (Labetalol Hcl) .... Take 1 By Mouth Once Daily 10)  Clonidine Hcl 0.2 Mg Tabs (Clonidine Hcl) .... 1/2 Tab By Mouth Every Am, 1/2 Tab By Mouth At Lunch and 1 Tab By Mouth Every Pm.  Allergies (verified): 1)  ! * Isosorbide 2)  ! Procardia 3)  ! * Novicaine 4)  ! Fosamax 5)  ! Flagyl 6)  ! * Shellfish  Past History:  Past Medical History: DM2  hypercalcemia OP hyperlipidemia HTN CAD  past-  zoster diverticulosis  adenomatous colon polyps, last colonoscopy 2010, Dr. Mechele Collin, no need for further colonoscopies given age  cardiol- Fath  Past Surgical History: Hysterectomy, ovaries intact R shoulder shoulder Exercise Stress Cardiolite NML EF 62% 5/00 Dexa - OP of spine 7/03, slight improvement 3/06 Renal Artery Stenosisi, right 6/05 CT angiogram 6/05 Angiogram - kidneys bilateral 40-50% stenosis 7/05 CP myoview stress NML EF 80% 8/05 Myoview - NML 2007 9/10 colonoscopy- polyps   Family History: parents and brother CAD  Social History: Former Smoker no alcohol at all is very active  does not drink caffeine  Review of Systems       Pertinent positive and negative review of systems were noted in the above HPI and GI specific review of systems.  All other review of systems was otherwise negative.   Vital Signs:  Patient profile:   77 year old female Height:      62 inches Weight:      100 pounds BMI:     18.36 Pulse rate:   64 / minute Pulse rhythm:   regular BP sitting:   110 / 68  (left arm) Cuff size:   regular  Vitals Entered By: Ok Anis CMA (June 10, 2010 2:15 PM)  Physical Exam  Additional Exam:  Constitutional: generally well appearing Psychiatric: alert and oriented times 3 Eyes: extraocular movements intact Mouth: oropharynx moist, no lesions Neck: supple, no lymphadenopathy Cardiovascular: heart regular rate and rythm Lungs: CTA bilaterally Abdomen: soft, non-tender, non-distended, no obvious ascites, no peritoneal signs, normal bowel sounds Extremities: no lower extremity edema bilaterally Skin: no lesions on visible extremities    Impression & Recommendations:  Problem # 1:  Intermittent vomiting she has intermittent vomiting without any other associated symptoms. She does have some nausea prior to her vomiting and no pain. Perhaps she has gastritis, significant esophagitis, peptic ulcer disease. We will proceed with EGD at her  soonest convenience. I have given her samples of a proton pump inhibitor, told her to substitute this for her H2 blocker for now. She will get basic set of labs including a CBC, complete metabolic profile, sedimentation rate.    Other Orders: TLB-CBC Platelet - w/Differential (85025-CBCD) TLB-CMP (Comprehensive Metabolic Pnl) (80053-COMP) TLB-Sedimentation Rate (ESR) (85652-ESR)  Patient Instructions: 1)  You will be scheduled to have an upper endoscopy. 2)  You will get lab test(s) done today (cbc, cmet, esr). 3)  Samples of PPI (nexium), take one pill, once daily.  This is in place of your zantac. 4)  Would proceed with [place] if the above workup is negative. 5)  A copy of this information will be sent to Dr. Milinda Antis. 6)  The medication list was reviewed and reconciled.  All changed / newly prescribed medications were explained.  A complete medication list was provided to the patient / caregiver.   Appended Document: Orders Update Clinical Lists Changes   Medications: Added new medication of NEXIUM 40 MG CPDR (ESOMEPRAZOLE MAGNESIUM) Take 1 capsule by mouth once a day Orders: Added new Test order of EGD (EGD) - Signed

## 2010-10-18 NOTE — Progress Notes (Signed)
Summary: form for diabetic supplies  Phone Note From Pharmacy   Caller: [facility] Summary of Call: Form for diabetic supplies is on your shelf Initial call taken by: Lowella Petties CMA,  October 15, 2009 11:43 AM  Follow-up for Phone Call        form done and in nurse in box  Follow-up by: Judith Part MD,  October 18, 2009 7:55 AM  Additional Follow-up for Phone Call Additional follow up Details #1::        Form faxed. Additional Follow-up by: Lowella Petties CMA,  October 18, 2009 8:17 AM

## 2010-10-18 NOTE — Consult Note (Signed)
Summary: [facility]  [facility]   Imported By: Lanelle Bal 05/02/2010 09:39:10  _____________________________________________________________________  External Attachment:    Type:   Image     Comment:   External Document

## 2010-10-18 NOTE — Consult Note (Signed)
Summary: [facility]  [facility]   Imported By: Lanelle Bal 10/14/2009 13:14:16  _____________________________________________________________________  External Attachment:    Type:   Image     Comment:   External Document

## 2010-10-18 NOTE — Assessment & Plan Note (Signed)
Summary: Zostavax Salley Scarlet  Nurse Visit   Allergies: 1)  ! * Isosorbide 2)  ! Procardia 3)  ! * Novicaine 4)  ! Fosamax 5)  ! Flagyl 6)  ! * Shellfish  Immunizations Administered:  Zostavax # 1:    Vaccine Type: Zostavax    Site: Left arm    Mfr: Merck    Dose: 0.5 ml    Route: [employer]    Given by: Delilah Shan CMA (AAMA)    Exp. Date: 04/13/2011    Lot #: [id number]    VIS given: 06/30/05 given April 05, 2010.  Orders Added: 1)  Zoster (Shingles) Vaccine Live [[id number]] 2)  Admin 1st Vaccine [phone]

## 2010-10-18 NOTE — Letter (Signed)
Summary: Diabetic Instructions  [facility]  [address] [place], [place] [address]   Phone: [phone]  Fax: [phone]    Bianca Mueller Nov 23, 1925 MRN: [id number]   _x_   ORAL DIABETIC MEDICATION INSTRUCTIONS  The day before your procedure:   Take your diabetic pill as you do normally  The day of your procedure:   Do not take your diabetic pill    We will check your blood sugar levels during the admission process and again in Recovery before discharging you home  ________________________________________________________________________

## 2010-10-18 NOTE — Letter (Signed)
Summary: EGD Instructions  [facility]  [address] [place], [place] [address]   Phone: [phone]  Fax: [phone]       Bianca Mueller    01-Feb-1926    MRN: [id number]       Procedure Day [name], 06/22/10     Arrival Time: 1:30 PM     Procedure Time: 2:30 PM     Location of Procedure:                    _X_ [facility] (4th Floor)  PREPARATION FOR ENDOSCOPY   On WEDNESDAY, 06/22/10 THE DAY OF THE PROCEDURE:  1.   No solid foods, milk or milk products are allowed after midnight the night before your procedure.  2.   Do not drink anything colored red or purple.  Avoid juices with pulp.  No orange juice.  3.  You may drink clear liquids until 12:30 PM, which is 2 hours before your procedure.                                                                                                CLEAR LIQUIDS INCLUDE: Water Jello Ice Popsicles Tea (sugar ok, no milk/cream) Powdered fruit flavored drinks Coffee (sugar ok, no milk/cream) Gatorade Juice: apple, white grape, white cranberry  Lemonade Clear bullion, consomm, broth Carbonated beverages (any kind) Strained chicken noodle soup Hard Candy   MEDICATION INSTRUCTIONS  Unless otherwise instructed, you should take regular prescription medications with a small sip of water as early as possible the morning of your procedure.  Diabetic patients - see separate instructions.                 OTHER INSTRUCTIONS  You will need a responsible adult at least 75 years of age to accompany you and drive you home.   This person must remain in the waiting room during your procedure.  Wear loose fitting clothing that is easily removed.  Leave jewelry and other valuables at home.  However, you may wish to bring a book to read or an iPod/MP3 player to listen to music as you wait for your procedure to start.  Remove all body piercing jewelry and leave at home.  Total time from sign-in  until discharge is approximately 2-3 hours.  You should go home directly after your procedure and rest.  You can resume normal activities the day after your procedure.  The day of your procedure you should not:   Drive   Make legal decisions   Operate machinery   Drink alcohol   Return to work  You will receive specific instructions about eating, activities and medications before you leave.    The above instructions have been reviewed and explained to me by   Francee Piccolo, CMA (AAMA)     I fully understand and can verbalize these instructions _____________________________ Date 06/10/10

## 2010-10-18 NOTE — Procedures (Signed)
Summary: Upper Endoscopy  Patient: Bianca Mueller Note: All result statuses are Final unless otherwise noted.  Tests: (1) Upper Endoscopy (EGD)   EGD Upper Endoscopy       DONE     [facility]     [address]     [place], [place]  [address]           ENDOSCOPY PROCEDURE REPORT           PATIENT:  Bianca Mueller, Bianca Mueller  MR#:  [id number]     BIRTHDATE:  01-14-1926, 83 yrs. old  GENDER:  female     ENDOSCOPIST:  Rachael Fee, MD     Referred by:  Marne A. Milinda Antis, M.D.     PROCEDURE DATE:  06/22/2010     PROCEDURE:  EGD with biopsy     ASA CLASS:  Class II     INDICATIONS:  intermittent nausea, vomiting     MEDICATIONS:   Fentanyl 25 mcg IV, Versed 4 mg IV     TOPICAL ANESTHETIC:  Exactacain Spray           DESCRIPTION OF PROCEDURE:   After the risks benefits and     alternatives of the procedure were thoroughly explained, informed     consent was obtained.  The [facility] [id number] endoscope was     introduced through the mouth and advanced to the second portion of     the duodenum, without limitations.  The instrument was slowly     withdrawn as the mucosa was fully examined.     <<PROCEDUREIMAGES>>           A hiatal hernia was found. This was 3-4cm (see image7 and image4).     There was mild, non-specific gastritis. Biopsies were taken to     check for H. pylori (see image6).  Otherwise the examination was     normal (see image2 and image3).    Retroflexed views revealed no     abnormalities.    The scope was then withdrawn from the patient     and the procedure completed.           COMPLICATIONS:  None           ENDOSCOPIC IMPRESSION:     1) Small hiatal hernia     2) Mild gastritis, biopsied to check for H. pylori     3) Otherwise normal examination           RECOMMENDATIONS:     Continue once daily PPI (nexium) since it seems to be helping, a     new prescription was called in.     If biopsies show H. pylori, she will be started on appropriate  antibiotics.           ______________________________     Rachael Fee, MD           n.     eSIGNED:   Rachael Fee at 06/22/2010 02:54 PM           Armond Hang, [id number]  Note: An exclamation mark (!) indicates a result that was not dispersed into the flowsheet. Document Creation Date: 06/22/2010 2:54 PM _______________________________________________________________________  (1) Order result status: Final Collection or observation date-time: 06/22/2010 14:45 Requested date-time:  Receipt date-time:  Reported date-time:  Referring Physician:   Ordering Physician: Rob Bunting [phone]) Specimen Source:  Source: Launa Grill Order Number: [id number] Lab site:

## 2010-10-18 NOTE — Miscellaneous (Signed)
Summary: mammogram results  Clinical Lists Changes  Observations: Added new observation of MAMMO DUE: 10/2010 (10/18/2009 9:18) Added new observation of MAMMOGRAM: normal (10/18/2009 9:18)      Preventive Care Screening  Mammogram:    Date:  10/18/2009    Next Due:  10/2010    Results:  normal

## 2010-10-18 NOTE — Assessment & Plan Note (Signed)
Summary: Vomiting episodes becoming more frequent.  2.  ? Sinus infx. ...   Vital Signs:  Patient profile:   77 year old female Height:      62 inches Weight:      101.25 pounds BMI:     18.59 Temp:     98.2 degrees F oral Pulse rate:   76 / minute Pulse rhythm:   regular BP sitting:   116 / 70  (left arm) Cuff size:   regular  Vitals Entered By: Lewanda Rife LPN (June 06, 2010 8:13 AM) CC: vomiting episodes about once a week for a month and ?sinus infection, sinus drainage and face hurts around eyes.   Chief Complaint:  vomiting episodes about once a week for a month and ?sinus infection and sinus drainage and face hurts around eyes.Marland Kitchen  History of Present Illness: having trouble with vomiting somewhat frequently - no certain foods  food does sometimes hit her stomach and comes right back up and she gets hot before it comes up  happening 1 time per week  in past gets nauseated a lot - ? gastroparesis  no abdominal pain or diarrhea  this has been happening for close to a year  has not seen GI about this   is having a lot of post nasal drip  pain in her face - worse at night  does spit out a bit of white mucous  face hurts more above her eyes  some congestion no fever no sore throat  no fever now   no no meds no chest pain   Allergies: 1)  ! * Isosorbide 2)  ! Procardia 3)  ! * Novicaine 4)  ! Fosamax 5)  ! Flagyl 6)  ! * Shellfish  Past History:  Past Medical History: Last updated: 06/09/2009 DM2  hypercalcemia OP hyperlipidemia HTN CAD  past- zoster diverticulosis   [facility]- [facility]  Past Surgical History: Last updated: 06/09/2009 Hysterectomy, ovaries intact R shoulder shoulder Exercise Stress Cardiolite NML EF 62% 5/00 Dexa - OP of spine 7/03, slight improvement 3/06 Renal Artery Stenosisi, right 6/05 CT angiogram 6/05 colonoscopy - polyp 7/05, diverticulosis - recheck in 3 years Angiogram - kidneys bilateral 40-50% stenosis  7/05 CP myoview stress NML EF 80% 8/05 Myoview - NML 2007 9/10 colonoscopy- polyps  Family History: Last updated: 09/02/2007 parents and brother CAD  Social History: Last updated: 09/02/2008 Former Smoker no alcohol at all is very active   Risk Factors: Smoking Status: quit (01/21/2007)  Review of Systems General:  Denies chills, fatigue, fever, and malaise. Eyes:  Denies blurring, discharge, and eye irritation. CV:  Denies chest pain or discomfort, lightheadness, and palpitations. Resp:  Denies cough and wheezing. GI:  Complains of vomiting; denies abdominal pain, change in bowel habits, indigestion, loss of appetite, and nausea. GU:  Denies discharge and nocturia. MS:  Denies joint pain, joint redness, and joint swelling. Derm:  Denies itching, lesion(s), poor wound healing, and rash. Neuro:  Denies numbness and tingling. Endo:  Denies cold intolerance, excessive thirst, excessive urination, and heat intolerance. Heme:  Denies abnormal bruising and bleeding.  Physical Exam  General:  Well-developed,well-nourished,in no acute distress; alert,appropriate and cooperative throughout examination Head:  tender R maxillary sinus  normocephalic, atraumatic, and no abnormalities observed.   Eyes:  vision grossly intact, pupils equal, pupils round, and pupils reactive to light.  no conjunctival pallor, injection or icterus  Ears:  R ear normal and L ear normal.   Nose:  nares  are injected and congested bilaterally  Mouth:  pharynx pink and moist, no erythema, and no exudates.   Neck:  No deformities, masses, or tenderness noted. Lungs:  Normal respiratory effort, chest expands symmetrically. Lungs are clear to auscultation, no crackles or wheezes. Heart:  Normal rate and regular rhythm. S1 and S2 normal without gallop, murmur, click, rub or other extra sounds. Abdomen:  Bowel sounds positive,abdomen soft and non-tender without masses, organomegaly or hernias noted. Msk:  No  deformity or scoliosis noted of thoracic or lumbar spine.   Pulses:  R and L posterior tibial pulses are full and equal bilaterally  Extremities:  trace pedal edema feet have spider veins/ some hyperemia  Skin:  Intact without suspicious lesions or rashes Inguinal Nodes:  No significant adenopathy Psych:  normal affect, talkative and pleasant   Diabetes Management Exam:    Foot Exam (with socks and/or shoes not present):       Sensory-Pinprick/Light touch:          Left medial foot (L-4): normal          Left dorsal foot (L-5): normal          Left lateral foot (S-1): normal          Right medial foot (L-4): normal          Right dorsal foot (L-5): normal          Right lateral foot (S-1): normal       Sensory-Monofilament:          Left foot: normal          Right foot: normal       Inspection:          Left foot: normal          Right foot: normal       Nails:          Left foot: normal          Right foot: normal   Impression & Recommendations:  Problem # 1:  PERSISTENT VOMITING (ICD-536.2) Assessment New intermittent (once weekly ) immediate vomiting after swallowing food occ persistant nausea  no pain pt is DM- ? gastroparesis or poss esoph stricture  wt is stable ref to GI Orders: Gastroenterology Referral (GI) Prescription Created Electronically [phone])  Problem # 2:  SINUSITIS - ACUTE-NOS (ICD-461.9) Assessment: New  facial pain with congestion and post nasal drip 2 wk (pt has no hx of allergies per her) augmentin as directed  antihistamine prn Her updated medication list for this problem includes:    Augmentin 875-125 Mg Tabs (Amoxicillin-pot clavulanate) .Marland Kitchen... 1 by mouth two times a day for 10 days for sinus infection  Orders: Prescription Created Electronically [phone])  Complete Medication List: 1)  Norvasc 5 Mg Tabs (Amlodipine besylate) .... One by mouth at bedtime 2)  Zocor 20 Mg Tabs (Simvastatin) .Marland Kitchen.. 1 by mouth once daily 3)  Aspirin 325 Mg  Tabs (Aspirin) .Marland Kitchen.. 1 by mouth once daily 4)  Calcium Carbonate 600 Mg Tabs (Calcium carbonate) .... 2 by mouth once daily 5)  Metformin Hcl 1000 Mg Tabs (Metformin hcl) .Marland Kitchen.. 1 by mouth two times a day 6)  Capozide 25-15 Mg Tabs (Captopril-hydrochlorothiazide) .Marland Kitchen.. 1 by mouth three times a day 7)  Miacalcin 200 Unit/ml Soln (Calcitonin (salmon)) .Marland Kitchen.. 1 spray in one nostril daily (switch nostrils every other day) 8)  Vitamin D 50,000 Iu  .Marland Kitchen.. 1 by mouth q week for 12 weeks 9)  Labetalol Hcl  100 Mg Tabs (Labetalol hcl) .... Take 1 by mouth once daily 10)  Clonidine Hcl 0.2 Mg Tabs (Clonidine hcl) .... 1/2 tab by mouth every am, 1/2 tab by mouth at lunch and 1 tab by mouth every pm. 11)  Augmentin 875-125 Mg Tabs (Amoxicillin-pot clavulanate) .Marland Kitchen.. 1 by mouth two times a day for 10 days for sinus infection  Patient Instructions: 1)  eat slowly in small amounts  2)  we will refer you to GI at check out  3)  take the augmentin for sinus infection 4)  you can try over the counter claritin or zyrtec for the post nasal drip as needed 5)  let me know if not improved next week  Prescriptions: AUGMENTIN 875-125 MG TABS (AMOXICILLIN-POT CLAVULANATE) 1 by mouth two times a day for 10 days for sinus infection  #20 x 0   Entered and Authorized by:   Judith Part MD   Signed by:   Judith Part MD on 06/06/2010   Method used:   Electronically to        [facility] (retail)       [address]       [place], [place]  [address]       Ph: [phone]       Fax: [phone]   RxID:   [id number]   Current Allergies (reviewed today): ! * ISOSORBIDE ! PROCARDIA ! * NOVICAINE ! FOSAMAX ! FLAGYL ! * SHELLFISH

## 2010-10-18 NOTE — Assessment & Plan Note (Signed)
Summary: F/U AFTER LABS / LFW R/S 03/10/10   Vital Signs:  Patient profile:   77 year old female Height:      62 inches Weight:      100.75 pounds BMI:     18.49 Temp:     98.1 degrees F oral Pulse rate:   76 / minute Pulse rhythm:   regular BP sitting:   126 / 72  (left arm) Cuff size:   regular  Vitals Entered By: Lewanda Rife LPN (March 16, 2010 8:55 AM) CC: six month f/u after labs   History of Present Illness: here for f/u of DM and lipidsand HTN   feet are swelling worse than usual this summer  better with norvasc at night  swelling does not hurt  worse at the end of the day  does not want another drug  does try to elevate feet when she can  does not have supp hose   wt is down 1 lb  bp great 126/72- has been well controlled   lipids good with LDL stable at 77   sugar 141 sugars are wonderful at home -- gets 109-127  opthy up to date  mam done in jan   colon polyp in09- not due yet for cs   Td 06 ptc 06 shingles - has never had vaccine and not interested   dexa 09  Allergies: 1)  ! * Isosorbide 2)  ! Procardia 3)  ! * Novicaine 4)  ! Fosamax 5)  ! Flagyl 6)  ! * Shellfish  Past History:  Past Medical History: Last updated: 06/09/2009 DM2  hypercalcemia OP hyperlipidemia HTN CAD  past- zoster diverticulosis   [facility]- [facility]  Past Surgical History: Last updated: 06/09/2009 Hysterectomy, ovaries intact R shoulder shoulder Exercise Stress Cardiolite NML EF 62% 5/00 Dexa - OP of spine 7/03, slight improvement 3/06 Renal Artery Stenosisi, right 6/05 CT angiogram 6/05 colonoscopy - polyp 7/05, diverticulosis - recheck in 3 years Angiogram - kidneys bilateral 40-50% stenosis 7/05 CP myoview stress NML EF 80% 8/05 Myoview - NML 2007 9/10 colonoscopy- polyps  Family History: Last updated: 09/02/2007 parents and brother CAD  Social History: Last updated: 09/02/2008 Former Smoker no alcohol at all is very active    Risk Factors: Smoking Status: quit (01/21/2007)  Review of Systems General:  Denies fatigue, loss of appetite, and malaise. Eyes:  Denies blurring and eye irritation. CV:  Complains of swelling of feet; denies chest pain or discomfort, palpitations, and shortness of breath with exertion. Resp:  Denies cough, shortness of breath, and wheezing. GI:  Denies abdominal pain, change in bowel habits, and indigestion. GU:  Denies abnormal vaginal bleeding, discharge, and dysuria. MS:  Complains of stiffness; denies muscle aches and muscle weakness. Derm:  Denies lesion(s), poor wound healing, and rash. Neuro:  Denies numbness and tingling. Psych:  Denies anxiety and depression. Endo:  Denies excessive thirst and excessive urination. Heme:  Denies abnormal bruising and bleeding.  Physical Exam  General:  Well-developed,well-nourished,in no acute distress; alert,appropriate and cooperative throughout examination Head:  normocephalic, atraumatic, and no abnormalities observed.   Eyes:  vision grossly intact, pupils equal, pupils round, and pupils reactive to light.  no conjunctival pallor, injection or icterus  Mouth:  pharynx pink and moist.   Neck:  supple with full rom and no masses or thyromegally, no JVD or carotid bruit  Lungs:  Normal respiratory effort, chest expands symmetrically. Lungs are clear to auscultation, no crackles or wheezes. Heart:  Normal rate and regular rhythm. S1 and S2 normal without gallop, murmur, click, rub or other extra sounds. Abdomen:  Bowel sounds positive,abdomen soft and non-tender without masses, organomegaly or hernias noted. no renal bruits  Msk:  No deformity or scoliosis noted of thoracic or lumbar spine.  petite frame Pulses:  R and L posterior tibial pulses are full and equal bilaterally  Extremities:  trace pedal edema feet have spider veins/ some hyperemia  Neurologic:  sensation intact to light touch, gait normal, and DTRs symmetrical and normal.    Skin:  Intact without suspicious lesions or rashes Cervical Nodes:  No lymphadenopathy noted Inguinal Nodes:  No significant adenopathy Psych:  normal affect, talkative and pleasant   Diabetes Management Exam:    Foot Exam (with socks and/or shoes not present):       Sensory-Pinprick/Light touch:          Left medial foot (L-4): normal          Left dorsal foot (L-5): normal          Left lateral foot (S-1): normal          Right medial foot (L-4): normal          Right dorsal foot (L-5): normal          Right lateral foot (S-1): normal       Sensory-Monofilament:          Left foot: normal          Right foot: normal       Inspection:          Left foot: normal          Right foot: normal       Nails:          Left foot: normal          Right foot: normal   Impression & Recommendations:  Problem # 1:  DIABETES MELLITUS, TYPE II, CONTROLLED WITH DIET (ICD-250.00) Assessment Unchanged  well controlled with stable home sugars/ wt and diet  plan AIc with next lab and f/u up to date opthy good foot care  Her updated medication list for this problem includes:    Aspirin 325 Mg Tabs (Aspirin) .Marland Kitchen... 1 by mouth once daily    Metformin Hcl 1000 Mg Tabs (Metformin hcl) .Marland Kitchen... 1 by mouth two times a day    Capozide 25-15 Mg Tabs (Captopril-hydrochlorothiazide) .Marland Kitchen... 1 by mouth three times a day  Labs Reviewed: Creat: 0.4 (03/08/2010)   Microalbumin: Normal (10/19/2006)  Last Eye Exam: normal (10/06/2009) Reviewed HgBA1c results: 6.4 (09/01/2009)  6.4 (02/26/2009)  Orders: Prescription Created Electronically [id number])  Problem # 2:  OSTEOPOROSIS (ICD-733.00) Assessment: Unchanged check dexa when able  continue ca and D check D level at next labs continue miacalcin not candidate for miacalcin Her updated medication list for this problem includes:    Calcium Carbonate 600 Mg Tabs (Calcium carbonate) .Marland Kitchen... 2 by mouth once daily    Miacalcin 200 Unit/ml Soln (Calcitonin  (salmon)) .Marland Kitchen... 1 spray in one nostril daily (switch nostrils every other day)  Orders: Radiology Referral (Radiology) Prescription Created Electronically [phone])  Problem # 3:  HYPERCHOLESTEROLEMIA (ICD-272.0) Assessment: Unchanged  continues to be well controlled rev low sat fat diet renewed zocor  lab and f/u in 6 mo  Her updated medication list for this problem includes:    Zocor 20 Mg Tabs (Simvastatin) .Marland Kitchen... 1 by mouth once daily  Labs Reviewed: SGOT: 18 (03/08/2010)  SGPT: 13 (03/08/2010)   HDL:42.80 (03/08/2010), 47.60 (09/01/2009)  LDL:72 (09/01/2009), 71 (08/26/2008)  Chol:140 (03/08/2010), 142 (09/01/2009)  Trig:204.0 (03/08/2010), 110.0 (09/01/2009)  Orders: Prescription Created Electronically [phone])  Problem # 4:  HYPERTENSION (ICD-401.9) Assessment: Improved  very well controlled no change in med  rev low sodium diet  The following medications were removed from the medication list:    Clonidine Hcl 0.1 Mg Tabs (Clonidine hcl) .Marland Kitchen... 1/2 by mouth qam , and 1/2 by mouth at lunch and 1 by mouth q pm Her updated medication list for this problem includes:    Norvasc 5 Mg Tabs (Amlodipine besylate) ..... One by mouth at bedtime    Capozide 25-15 Mg Tabs (Captopril-hydrochlorothiazide) .Marland Kitchen... 1 by mouth three times a day    Labetalol Hcl 100 Mg Tabs (Labetalol hcl) .Marland Kitchen... Take 1 by mouth once daily    Clonidine Hcl 0.2 Mg Tabs (Clonidine hcl) .Marland Kitchen... 1/2 tab by mouth every am, 1/2 tab by mouth at lunch and 1 tab by mouth every pm.  BP today: 126/72 Prior BP: 110/70 (09/06/2009)  Labs Reviewed: K+: 4.6 (03/08/2010) Creat: : 0.4 (03/08/2010)   Chol: 140 (03/08/2010)   HDL: 42.80 (03/08/2010)   LDL: 72 (09/01/2009)   TG: 204.0 (03/08/2010)  Orders: Prescription Created Electronically [phone])  Problem # 5:  PEDAL EDEMA (ICD-782.3) Assessment: New  suspect from combination of venous insuff and meds  no cardiac red flags  is not bothersome enough for hose or diuretic  at this time- but will keep me updated  adv to drink water/ elevate feet and avoid sodium Her updated medication list for this problem includes:    Capozide 25-15 Mg Tabs (Captopril-hydrochlorothiazide) .Marland Kitchen... 1 by mouth three times a day  Orders: Prescription Created Electronically [phone])  Complete Medication List: 1)  Norvasc 5 Mg Tabs (Amlodipine besylate) .... One by mouth at bedtime 2)  Zocor 20 Mg Tabs (Simvastatin) .Marland Kitchen.. 1 by mouth once daily 3)  Aspirin 325 Mg Tabs (Aspirin) .Marland Kitchen.. 1 by mouth once daily 4)  Calcium Carbonate 600 Mg Tabs (Calcium carbonate) .... 2 by mouth once daily 5)  Metformin Hcl 1000 Mg Tabs (Metformin hcl) .Marland Kitchen.. 1 by mouth two times a day 6)  Capozide 25-15 Mg Tabs (Captopril-hydrochlorothiazide) .Marland Kitchen.. 1 by mouth three times a day 7)  Miacalcin 200 Unit/ml Soln (Calcitonin (salmon)) .Marland Kitchen.. 1 spray in one nostril daily (switch nostrils every other day) 8)  Vitamin D 50,000 Iu  .Marland Kitchen.. 1 by mouth q week for 12 weeks 9)  Labetalol Hcl 100 Mg Tabs (Labetalol hcl) .... Take 1 by mouth once daily 10)  Clonidine Hcl 0.2 Mg Tabs (Clonidine hcl) .... 1/2 tab by mouth every am, 1/2 tab by mouth at lunch and 1 tab by mouth every pm.  Patient Instructions: 1)  if leg/ foot swelling worsens- let me know  2)  we could consider support hose or extra diuretic medication  3)  no change in medicines 4)  schedule fasting lab and f/u in 6 months -- lipid/ast/alt/ renal / AIC / micraoalb. vit D level 250.0, 272 , 733.0 5)  we will schedule bone density test at check out  Prescriptions: ZOCOR 20 MG TABS (SIMVASTATIN) 1 by mouth once daily  #30 x 11   Entered and Authorized by:   Judith Part MD   Signed by:   Judith Part MD on 03/16/2010   Method used:   Electronically to        [facility] (retail)       [address]       [place], [place]  [address]       Ph: [phone]       Fax: [phone]   RxID:   [id number]   Current Allergies  (reviewed today): ! * ISOSORBIDE ! PROCARDIA ! * NOVICAINE ! FOSAMAX ! FLAGYL ! * SHELLFISH

## 2010-10-18 NOTE — Assessment & Plan Note (Signed)
Summary: Flu shot Bianca Mueller  Nurse Visit   Allergies: 1)  ! * Isosorbide 2)  ! Procardia 3)  ! * Novicaine 4)  ! Fosamax 5)  ! Flagyl 6)  ! * Shellfish  Orders Added: 1)  Flu Vaccine 80yrs + MEDICARE PATIENTS [Q2039] 2)  Administration Flu vaccine - MCR [G0008]    Flu Vaccine Consent Questions     Do you have a history of severe allergic reactions to this vaccine? no    Any prior history of allergic reactions to egg and/or gelatin? no    Do you have a sensitivity to the preservative Thimersol? no    Do you have a past history of Guillan-Barre Syndrome? no    Do you currently have an acute febrile illness? no    Have you ever had a severe reaction to latex? no    Vaccine information given and explained to patient? yes    Are you currently pregnant? no    Lot Number:[id number]   Exp Date:03/18/2011   Site Given  Left Deltoid IM1

## 2010-10-19 ENCOUNTER — Ambulatory Visit: Payer: Self-pay

## 2010-10-20 ENCOUNTER — Ambulatory Visit (INDEPENDENT_AMBULATORY_CARE_PROVIDER_SITE_OTHER): Payer: PRIVATE HEALTH INSURANCE | Admitting: Family Medicine

## 2010-10-20 ENCOUNTER — Other Ambulatory Visit: Payer: Self-pay | Admitting: Family Medicine

## 2010-10-20 ENCOUNTER — Encounter: Payer: Self-pay | Admitting: Family Medicine

## 2010-10-20 ENCOUNTER — Ambulatory Visit: Admit: 2010-10-20 | Payer: Self-pay | Admitting: Family Medicine

## 2010-10-20 DIAGNOSIS — D518 Other vitamin B12 deficiency anemias: Secondary | ICD-10-CM

## 2010-10-20 DIAGNOSIS — S90129A Contusion of unspecified lesser toe(s) without damage to nail, initial encounter: Secondary | ICD-10-CM

## 2010-10-20 NOTE — Assessment & Plan Note (Signed)
Summary: B-12 Salley Scarlet  Nurse Visit   Allergies: 1)  ! * Isosorbide 2)  ! Procardia 3)  ! * Novicaine 4)  ! Fosamax 5)  ! Flagyl 6)  ! * Shellfish  Medication Administration  Injection # 1:    Medication: Vit B12 1000 mcg    Diagnosis: ANEMIA, VITAMIN B12 DEFICIENCY (ICD-281.1)    Route: IM    Site: L deltoid    Exp Date: 04/17/2012    Lot #: [id number]    Mfr: [employer] Regent    Patient tolerated injection without complications    Given by: Delilah Shan CMA (AAMA) (September 27, 2010 2:00 PM)  Orders Added: 1)  Admin of Therapeutic Inj  intramuscular or subcutaneous [96372] 2)  Vit B12 1000 mcg [J3420]   Medication Administration  Injection # 1:    Medication: Vit B12 1000 mcg    Diagnosis: ANEMIA, VITAMIN B12 DEFICIENCY (ICD-281.1)    Route: IM    Site: L deltoid    Exp Date: 04/17/2012    Lot #: [id number]    Mfr: [employer] Regent    Patient tolerated injection without complications    Given by: Delilah Shan CMA (AAMA) (September 27, 2010 2:00 PM)  Orders Added: 1)  Admin of Therapeutic Inj  intramuscular or subcutaneous [96372] 2)  Vit B12 1000 mcg [J3420]

## 2010-10-20 NOTE — Assessment & Plan Note (Signed)
Summary: Vitamin B-12 Salley Scarlet  Nurse Visit   Allergies: 1)  ! * Isosorbide 2)  ! Procardia 3)  ! * Novicaine 4)  ! Fosamax 5)  ! Flagyl 6)  ! * Shellfish  Medication Administration  Injection # 1:    Medication: Vit B12 1000 mcg    Diagnosis: ANEMIA, VITAMIN B12 DEFICIENCY (ICD-281.1)    Route: IM    Site: R deltoid    Exp Date: 04/17/2012    Lot #: [id number]    Mfr: [employer] Regent    Patient tolerated injection without complications    Given by: Delilah Shan CMA (AAMA) (October 06, 2010 10:33 AM)  Orders Added: 1)  Admin of Therapeutic Inj  intramuscular or subcutaneous [96372] 2)  Vit B12 1000 mcg [J3420]   Medication Administration  Injection # 1:    Medication: Vit B12 1000 mcg    Diagnosis: ANEMIA, VITAMIN B12 DEFICIENCY (ICD-281.1)    Route: IM    Site: R deltoid    Exp Date: 04/17/2012    Lot #: [id number]    Mfr: [employer] Regent    Patient tolerated injection without complications    Given by: Delilah Shan CMA (AAMA) (October 06, 2010 10:33 AM)  Orders Added: 1)  Admin of Therapeutic Inj  intramuscular or subcutaneous [96372] 2)  Vit B12 1000 mcg [J3420]

## 2010-10-20 NOTE — Assessment & Plan Note (Signed)
Summary: Vit. B-12 Bianca Mueller  Nurse Visit   Allergies: 1)  ! * Isosorbide 2)  ! Procardia 3)  ! * Novicaine 4)  ! Fosamax 5)  ! Flagyl 6)  ! * Shellfish  Medication Administration  Injection # 1:    Medication: Vit B12 1000 mcg    Diagnosis: ANEMIA, VITAMIN B12 DEFICIENCY (ICD-281.1)    Route: IM    Site: L deltoid    Exp Date: 04/17/2012    Lot #: [id number]    Mfr: American Regent    Patient tolerated injection without complications    Given by: Delilah Shan CMA (AAMA) (October 13, 2010 11:19 AM)  Orders Added: 1)  Admin of Therapeutic Inj  intramuscular or subcutaneous [96372] 2)  Vit B12 1000 mcg [J3420]   Medication Administration  Injection # 1:    Medication: Vit B12 1000 mcg    Diagnosis: ANEMIA, VITAMIN B12 DEFICIENCY (ICD-281.1)    Route: IM    Site: L deltoid    Exp Date: 04/17/2012    Lot #: [id number]    Mfr: American Regent    Patient tolerated injection without complications    Given by: Delilah Shan CMA (AAMA) (October 13, 2010 11:19 AM)  Orders Added: 1)  Admin of Therapeutic Inj  intramuscular or subcutaneous [96372] 2)  Vit B12 1000 mcg [J3420]

## 2010-10-20 NOTE — Progress Notes (Signed)
Summary: ? Vit B-12 level  Phone Note Call from Patient Call back at Home Phone [phone]   Caller: Daughter, Lugene Call For: Judith Part MD Summary of Call: I will be attempting to bring in Mom's blood on Monday for her routine labs.  (I tried last week with no luck, that's why you have a microalbumin and nothing else).  For some time now, Mom has complained with not being able to taste well and smelling a bad odor that no one else smells.  She says this takes her appetite sometimes.  At the risk of over-stepping my boundaries (and I apologize if I am) could we  check a Vit B-12 level?  Dr. Dayton Martes had a patient when I was working for her that said he had totally lost his sense of taste and appetite and was losing weight and they finally found out that his B-12 was extremely low and after some weekly injections and then monthly injections, he is cured. I would love to think it is this easy.   I understand if you want to see her first and she will be making a CPX appt soon.   Initial call taken by: Delilah Shan CMA Duncan Dull),  September 23, 2010 4:24 PM  Follow-up for Phone Call        no problem-- please add B12 and folate as well -- good idea  we will discuss further when I see her  Follow-up by: Judith Part MD,  September 23, 2010 5:10 PM  Additional Follow-up for Phone Call Additional follow up Details #1::        Thanks.  Added tests. Additional Follow-up by: Delilah Shan CMA (AAMA),  September 23, 2010 5:17 PM

## 2010-10-26 NOTE — Assessment & Plan Note (Signed)
Summary: 5 wk follow up   Vital Signs:  Patient profile:   77 year old female Height:      62 inches Weight:      104 pounds BMI:     19.09 Temp:     97.6 degrees F oral Pulse rate:   76 / minute Pulse rhythm:   regular BP sitting:   114 / 60  (left arm) Cuff size:   regular  Vitals Entered By: Lewanda Rife LPN (October 20, 2010 9:11 AM) CC: 5 wk f/u after labs   History of Present Illness: here for f/u of low B12 and also to look at toe  has a little hole in nail - not painful  no trauma   wt is up 4 lb  114/60  was feeling tired checked B12 was 49- extremely low D ok at 40  has had once B12 shot weekly for 4 weeks   returns for PE later in the month   diet is not restrictive  no alcohol  no major stomach problems except heartburn  is on nexium  unable to taste very well  does not like meat  eats some green vegetables      some fatigue- has to rest at times  not feeling horrible or sick  some cramps in her feet and toes   Allergies: 1)  ! * Isosorbide 2)  ! Procardia 3)  ! * Novicaine 4)  ! Fosamax 5)  ! Flagyl 6)  ! * Shellfish  Past History:  Past Medical History: Last updated: 06/10/2010 DM2  hypercalcemia OP hyperlipidemia HTN CAD  past- zoster diverticulosis  adenomatous colon polyps, last colonoscopy 2010, Dr. Mechele Collin, no need for further colonoscopies given age  cardiol- Fath  Past Surgical History: Last updated: 06/10/2010 Hysterectomy, ovaries intact R shoulder shoulder Exercise Stress Cardiolite NML EF 62% 5/00 Dexa - OP of spine 7/03, slight improvement 3/06 Renal Artery Stenosisi, right 6/05 CT angiogram 6/05 Angiogram - kidneys bilateral 40-50% stenosis 7/05 CP myoview stress NML EF 80% 8/05 Myoview - NML 2007 9/10 colonoscopy- polyps   Family History: Last updated: 06/10/2010 parents and brother CAD  Social History: Last updated: 06/10/2010 Former Smoker no alcohol at all is very active  does not drink  caffeine  Risk Factors: Smoking Status: quit (01/21/2007)  Review of Systems General:  Complains of fatigue; denies loss of appetite and malaise. Eyes:  Denies blurring and eye irritation. CV:  Denies chest pain or discomfort, lightheadness, and palpitations. Resp:  Denies cough, shortness of breath, and wheezing. GI:  Denies abdominal pain, change in bowel habits, indigestion, and nausea. GU:  Denies urinary frequency. MS:  Complains of cramps; denies muscle aches and muscle weakness. Derm:  Denies itching, lesion(s), poor wound healing, and rash. Neuro:  Denies numbness, tingling, and tremors. Psych:  Denies anxiety and depression. Endo:  Denies cold intolerance, excessive thirst, excessive urination, and heat intolerance. Heme:  Denies abnormal bruising and bleeding.  Physical Exam  General:  Well-developed,well-nourished,in no acute distress; alert,appropriate and cooperative throughout examination Head:  normocephalic, atraumatic, and no abnormalities observed.   Eyes:  vision grossly intact, pupils equal, pupils round, and pupils reactive to light.   Mouth:  pharynx pink and moist.   Neck:  supple with full rom and no masses or thyromegally, no JVD or carotid bruit  Lungs:  Normal respiratory effort, chest expands symmetrically. Lungs are clear to auscultation, no crackles or wheezes. Heart:  Normal rate and regular rhythm. S1 and  S2 normal without gallop, murmur, click, rub or other extra sounds. Abdomen:  Bowel sounds positive,abdomen soft and non-tender without masses, organomegaly or hernias noted. Msk:  No deformity or scoliosis noted of thoracic or lumbar spine.   Pulses:  R and L posterior tibial pulses are full and equal bilaterally  Extremities:  trace pedal edema feet have spider veins/ some hyperemia  Neurologic:  sensation intact to pinprick, gait normal, and DTRs symmetrical and normal.  no tremor  Skin:  L med first toenail has 2-3 mm area of nail loss/  erosion no nevus or discoloration seen  does app to be growing out  Cervical Nodes:  No lymphadenopathy noted Psych:  normal affect, talkative and pleasant   Diabetes Management Exam:    Foot Exam (with socks and/or shoes not present):       Sensory-Pinprick/Light touch:          Left medial foot (L-4): normal          Left dorsal foot (L-5): normal          Left lateral foot (S-1): normal          Right medial foot (L-4): normal          Right dorsal foot (L-5): normal          Right lateral foot (S-1): normal       Sensory-Monofilament:          Left foot: normal          Right foot: normal       Inspection:          Left foot: normal          Right foot: normal       Nails:          Left foot: normal          Right foot: normal   Impression & Recommendations:  Problem # 1:  ANEMIA, VITAMIN B12 DEFICIENCY (ICD-281.1) Assessment Improved here for f/u - not feeling much clinical change after 4 shots but overall was not feeling too bad to begin with  I explained imp of B12 to muscle and nerve function and need to get level up lab today- and then adv re: shots and oral replacement  suspect there is a degree of malabsorbtion due to PPI use and heartburn issues in past update if new symptoms f/u end of month as planned Orders: Venipuncture (16109) TLB-B12, Serum-Total ONLY (60454-U98) Admin of Therapeutic Inj  intramuscular or subcutaneous (11914) Vit B12 1000 mcg (J3420)  Problem # 2:  CONTUSION OF TOE (ICD-924.3) Assessment: New pt has an area of eroded nail focally on med L foot  this looks more traumatic than anything - but pt does not remember for sure  onchomycosis is possible also - will look at it again at end of month appt  Complete Medication List: 1)  Norvasc 5 Mg Tabs (Amlodipine besylate) .... One by mouth at bedtime 2)  Zocor 20 Mg Tabs (Simvastatin) .Marland Kitchen.. 1 by mouth once daily 3)  Aspirin 325 Mg Tabs (Aspirin) .Marland Kitchen.. 1 by mouth once daily 4)  Calcium  Carbonate 600 Mg Tabs (Calcium carbonate) .... 2 by mouth once daily 5)  Metformin Hcl 1000 Mg Tabs (Metformin hcl) .Marland Kitchen.. 1 by mouth two times a day 6)  Capozide 25-15 Mg Tabs (Captopril-hydrochlorothiazide) .Marland Kitchen.. 1 by mouth three times a day 7)  Miacalcin 200 Unit/ml Soln (Calcitonin (salmon)) .Marland Kitchen.. 1 spray in one nostril daily (switch  nostrils every other day) 8)  Vitamin D 50,000 Iu  .Marland Kitchen.. 1 by mouth q week for 12 weeks 9)  Labetalol Hcl 100 Mg Tabs (Labetalol hcl) .... Take 1 by mouth once daily 10)  Clonidine Hcl 0.2 Mg Tabs (Clonidine hcl) .... 1/2 tab by mouth every am, 1/2 tab by mouth at lunch and 1 tab by mouth every pm. 11)  Nexium 40 Mg Cpdr (Esomeprazole magnesium) .... Take 1 capsule by mouth once a day 12)  Nitrostat 0.4 Mg Subl (Nitroglycerin) .Marland Kitchen.. 1 under tounge as needed chest pain  if not improved seek medical attention at er  Patient Instructions: 1)  checking B12 today 2)  we will keep an eye on the toenail - check it next time 3)  if it hurts- let me know  4)  wear supportive shoes    Medication Administration  Injection # 1:    Medication: Vit B12 1000 mcg    Diagnosis: ANEMIA, VITAMIN B12 DEFICIENCY (ICD-281.1)    Route: IM    Site: R deltoid    Exp Date: 04/17/2012    Lot #: [id number]    Mfr: American Regent    Patient tolerated injection without complications    Given by: Delilah Shan CMA (AAMA) (January 31,2012)  Orders Added: 1)  Venipuncture [16109] 2)  TLB-B12, Serum-Total ONLY [82607-B12] 3)  Admin of Therapeutic Inj  intramuscular or subcutaneous [96372] 4)  Vit B12 1000 mcg [J3420] 5)  Est. Patient Level III [60454]    Current Allergies (reviewed today): ! * ISOSORBIDE ! PROCARDIA ! * NOVICAINE ! FOSAMAX ! FLAGYL ! * SHELLFISH   Medication Administration  Injection # 1:    Medication: Vit B12 1000 mcg    Diagnosis: ANEMIA, VITAMIN B12 DEFICIENCY (ICD-281.1)    Route: IM    Site: R deltoid    Exp Date: 04/17/2012    Lot #: [id number]    Mfr:  American Regent    Patient tolerated injection without complications    Given by: Delilah Shan CMA (AAMA) (January 31,2012)  Orders Added: 1)  Venipuncture [09811] 2)  TLB-B12, Serum-Total ONLY [82607-B12] 3)  Admin of Therapeutic Inj  intramuscular or subcutaneous [96372] 4)  Vit B12 1000 mcg [J3420] 5)  Est. Patient Level III [91478]

## 2010-11-10 ENCOUNTER — Encounter: Payer: Self-pay | Admitting: Family Medicine

## 2010-11-14 ENCOUNTER — Encounter: Payer: Self-pay | Admitting: Family Medicine

## 2010-11-14 ENCOUNTER — Encounter: Payer: PRIVATE HEALTH INSURANCE | Admitting: Family Medicine

## 2010-11-14 ENCOUNTER — Encounter (INDEPENDENT_AMBULATORY_CARE_PROVIDER_SITE_OTHER): Payer: PRIVATE HEALTH INSURANCE | Admitting: Family Medicine

## 2010-11-14 DIAGNOSIS — D518 Other vitamin B12 deficiency anemias: Secondary | ICD-10-CM

## 2010-11-14 DIAGNOSIS — I10 Essential (primary) hypertension: Secondary | ICD-10-CM

## 2010-11-14 DIAGNOSIS — E7800 Pure hypercholesterolemia, unspecified: Secondary | ICD-10-CM

## 2010-11-14 DIAGNOSIS — E119 Type 2 diabetes mellitus without complications: Secondary | ICD-10-CM

## 2010-11-14 DIAGNOSIS — Z1231 Encounter for screening mammogram for malignant neoplasm of breast: Secondary | ICD-10-CM

## 2010-11-14 DIAGNOSIS — M810 Age-related osteoporosis without current pathological fracture: Secondary | ICD-10-CM

## 2010-11-14 DIAGNOSIS — E559 Vitamin D deficiency, unspecified: Secondary | ICD-10-CM

## 2010-11-24 NOTE — Assessment & Plan Note (Signed)
Summary: MEDICARE CPE-GAVE FORM TO DAUGHTER TO FOR TO PT   Vital Signs:  Patient profile:   77 year old female Height:      62 inches Weight:      107.25 pounds BMI:     19.69 Temp:     98 degrees F oral Pulse rate:   80 / minute Pulse rhythm:   regular BP sitting:   110 / 68  (left arm) Cuff size:   regular  Vitals Entered By: Lewanda Rife LPN (November 14, 2010 11:05 AM) CC: checkup   History of Present Illness: here for check up of chronic med problems incl DM2 and lipids and OP and B12 def   and also to review health mt list  is feeling very good in general   wt is up 3 lb with bmi of 19  110/68- great HTN control   DM a bit worse withAIC of 7.2 -- too much bread and sweets  could not eat well for a while  doing much better now - is getting back to normal now  is walking for exercise -- and can do this most days opthy-- up to date form dec  lately better -- in ams is usually one teens   lipids very well controlled Last Lipid ProfileCholesterol: 139 (09/26/2010 9:51:53 AM)HDL:  43.40 (09/26/2010 9:51:53 AM)LDL:  75 (09/26/2010 9:51:53 AM)Triglycerides:  Last Liver profileSGOT:  18 (09/26/2010 9:51:53 AM)SPGT:  12 (09/26/2010 9:51:53 AM)T. Bili:  0.3 (06/10/2010 2:32:24 PM)Alk Phos:  41 (06/10/2010 2:32:24 PM)  sees Dr Lady Gary  B12 level much better with 4 shots  now on otc  shots monthly-- Lugene does that at home   colon polyo 2010- told she did not need f/u no problems   partial hyst in past - no cancer pap 07- no symptoms or problems   mam nl 1/11-- wants [place] to set that up self exam- no lumps   dexa 09 ca and D D level 40 and stable  no broken bones   imms up to date Td06 flu 10/11 ptx 06 zoster 2011    Allergies: 1)  ! * Isosorbide 2)  ! Procardia 3)  ! * Novicaine 4)  ! Fosamax 5)  ! Flagyl 6)  ! * Shellfish  Past History:  Past Medical History: Last updated: 06/10/2010 DM2  hypercalcemia OP hyperlipidemia HTN CAD  past-  zoster diverticulosis  adenomatous colon polyps, last colonoscopy 2010, Dr. Mechele Collin, no need for further colonoscopies given age  cardiol- Fath  Past Surgical History: Last updated: 06/10/2010 Hysterectomy, ovaries intact R shoulder shoulder Exercise Stress Cardiolite NML EF 62% 5/00 Dexa - OP of spine 7/03, slight improvement 3/06 Renal Artery Stenosisi, right 6/05 CT angiogram 6/05 Angiogram - kidneys bilateral 40-50% stenosis 7/05 CP myoview stress NML EF 80% 8/05 Myoview - NML 2007 9/10 colonoscopy- polyps   Family History: Last updated: 06/10/2010 parents and brother CAD  Social History: Last updated: 06/10/2010 Former Smoker no alcohol at all is very active  does not drink caffeine  Risk Factors: Smoking Status: quit (01/21/2007)  Review of Systems General:  Denies fatigue, fever, loss of appetite, and malaise. Eyes:  Denies blurring and eye irritation. CV:  Denies chest pain or discomfort, lightheadness, palpitations, and shortness of breath with exertion. Resp:  Denies cough, shortness of breath, and wheezing. GI:  Denies abdominal pain, change in bowel habits, indigestion, and nausea. GU:  Denies dysuria and urinary frequency. MS:  Complains of stiffness; denies muscle aches and cramps.  Derm:  Denies itching, lesion(s), poor wound healing, and rash. Neuro:  Denies numbness and tingling. Psych:  mood is good . Endo:  Denies cold intolerance, excessive thirst, excessive urination, and heat intolerance. Heme:  Denies abnormal bruising and bleeding.  Physical Exam  General:  slim and well appearing elderly female  Head:  normocephalic, atraumatic, and no abnormalities observed.   Eyes:  vision grossly intact, pupils equal, pupils round, and pupils reactive to light.  no conjunctival pallor, injection or icterus  Ears:  R ear normal and L ear normal.   Nose:  no nasal discharge.   Mouth:  pharynx pink and moist.   Neck:  supple with full rom and no masses or  thyromegally, no JVD or carotid bruit  Chest Wall:  No deformities, masses, or tenderness noted. Breasts:  No mass, nodules, thickening, tenderness, bulging, retraction, inflamation, nipple discharge or skin changes noted.   Lungs:  Normal respiratory effort, chest expands symmetrically. Lungs are clear to auscultation, no crackles or wheezes. Heart:  Normal rate and regular rhythm. S1 and S2 normal without gallop, murmur, click, rub or other extra sounds. Abdomen:  Bowel sounds positive,abdomen soft and non-tender without masses, organomegaly or hernias noted. no renal bruits  Msk:  No deformity or scoliosis noted of thoracic or lumbar spine.  some changes of OA Pulses:  R and L posterior tibial pulses are full and equal bilaterally  Extremities:  trace pedal edema feet have spider veins/ some hyperemia  Neurologic:  sensation intact to pinprick, gait normal, and DTRs symmetrical and normal.  no tremor  Skin:  area of evulsion on L first tonail has grown out enough to see a difference no rash no other nail changes  Cervical Nodes:  No lymphadenopathy noted Axillary Nodes:  No palpable lymphadenopathy Inguinal Nodes:  No significant adenopathy Psych:  normal affect, talkative and pleasant   Diabetes Management Exam:    Foot Exam (with socks and/or shoes not present):       Sensory-Pinprick/Light touch:          Left medial foot (L-4): normal          Left dorsal foot (L-5): normal          Left lateral foot (S-1): normal          Right medial foot (L-4): normal          Right dorsal foot (L-5): normal          Right lateral foot (S-1): normal       Sensory-Monofilament:          Left foot: normal          Right foot: normal       Inspection:          Left foot: normal          Right foot: normal       Nails:          Left foot: normal          Right foot: normal    Eye Exam:       Eye Exam done elsewhere          Date: 08/18/2010          Results: normal          Done by: Dr  Dingeldein   Impression & Recommendations:  Problem # 1:  ANEMIA, VITAMIN B12 DEFICIENCY (ICD-281.1) Assessment Improved much improved- rev labs with pt  will continue shots  every month for now  The following medications were removed from the medication list:    Vitamin B-12 250 Mcg Tabs (Cyanocobalamin) .Marland Kitchen... 1 by mouth once daily Her updated medication list for this problem includes:    Vitamin B-12 1000 Mcg Tabs (Cyanocobalamin) .Marland Kitchen... Take 1 tablet by mouth once a day    Cyanocobalamin 1000 Mcg/ml Soln (Cyanocobalamin) .Marland Kitchen... 1 shot monthly  Problem # 2:  UNSPECIFIED VITAMIN D DEFICIENCY (ICD-268.9) Assessment: Unchanged is stable on current otc vit D  continue that and ca   Problem # 3:  DIABETES MELLITUS, TYPE II, CONTROLLED WITH DIET (ICD-250.00) Assessment: Deteriorated  now on metformin  for brief period had to eat bland starchy foods and sugar went up  now back to nl good am sugars utd opthy adv to check some pm sugars 2 h pp  lab andf/u in 3 months  Her updated medication list for this problem includes:    Aspirin 325 Mg Tabs (Aspirin) .Marland Kitchen... 1 by mouth once daily    Metformin Hcl 1000 Mg Tabs (Metformin hcl) .Marland Kitchen... 1 by mouth two times a day    Capozide 25-15 Mg Tabs (Captopril-hydrochlorothiazide) .Marland Kitchen... 1 by mouth three times a day  Labs Reviewed: Creat: 0.7 (09/26/2010)   Microalbumin: Normal (10/19/2006)  Last Eye Exam: normal (08/18/2010) Reviewed HgBA1c results: 7.0 (09/26/2010)  6.4 (09/01/2009)  Problem # 4:  OSTEOPOROSIS (ICD-733.00) Assessment: Unchanged do not have dexa report from last year - sent for  disc exercise and ca andD D level ok  no fractures petite frame The following medications were removed from the medication list:    Calcium Carbonate 600 Mg Tabs (Calcium carbonate) .Marland Kitchen... 2 by mouth once daily Her updated medication list for this problem includes:    Miacalcin 200 Unit/ml Soln (Calcitonin (salmon)) .Marland Kitchen... 1 spray in one nostril  daily (switch nostrils every other day)    Calcium Carbonate-vitamin D 600-400 Mg-unit Tabs (Calcium carbonate-vitamin d) .Marland Kitchen... Take 1 tablet by mouth once a day    Vitamin D 1000 Unit Tabs (Cholecalciferol) .Marland Kitchen... Take 1 tablet by mouth once a day  Problem # 5:  HYPERCHOLESTEROLEMIA (ICD-272.0) Assessment: Unchanged  in very good control with simvastatin and diet rev low sat fat diet rev last labs  Her updated medication list for this problem includes:    Zocor 20 Mg Tabs (Simvastatin) .Marland Kitchen... 1 by mouth once daily  Labs Reviewed: SGOT: 18 (09/26/2010)   SGPT: 12 (09/26/2010)   HDL:43.40 (09/26/2010), 42.80 (03/08/2010)  LDL:75 (09/26/2010), 72 (09/01/2009)  Chol:139 (09/26/2010), 140 (03/08/2010)  Trig:101.0 (09/26/2010), 204.0 (03/08/2010)  Problem # 6:  HYPERTENSION (ICD-401.9) Assessment: Unchanged  good control - no change in meds comanaged by cardiol Her updated medication list for this problem includes:    Norvasc 5 Mg Tabs (Amlodipine besylate) ..... One by mouth at bedtime    Capozide 25-15 Mg Tabs (Captopril-hydrochlorothiazide) .Marland Kitchen... 1 by mouth three times a day    Labetalol Hcl 100 Mg Tabs (Labetalol hcl) .Marland Kitchen... Take 1 by mouth once daily    Clonidine Hcl 0.2 Mg Tabs (Clonidine hcl) .Marland Kitchen... 1/2 tab by mouth every am, 1/2 tab by mouth at lunch and 1 tab by mouth every pm.  BP today: 110/68 Prior BP: 114/60 (10/20/2010)  Labs Reviewed: K+: 4.3 (09/26/2010) Creat: : 0.7 (09/26/2010)   Chol: 139 (09/26/2010)   HDL: 43.40 (09/26/2010)   LDL: 75 (09/26/2010)   TG: 101.0 (09/26/2010)  Problem # 7:  OTHER SCREENING MAMMOGRAM (ICD-V76.12) Assessment: Comment Only annual mammogram scheduled  adv pt to continue regular self breast exams non remarkable breast exam today  Orders: Radiology Referral (Radiology)  Complete Medication List: 1)  Norvasc 5 Mg Tabs (Amlodipine besylate) .... One by mouth at bedtime 2)  Zocor 20 Mg Tabs (Simvastatin) .Marland Kitchen.. 1 by mouth once daily 3)   Aspirin 325 Mg Tabs (Aspirin) .Marland Kitchen.. 1 by mouth once daily 4)  Metformin Hcl 1000 Mg Tabs (Metformin hcl) .Marland Kitchen.. 1 by mouth two times a day 5)  Capozide 25-15 Mg Tabs (Captopril-hydrochlorothiazide) .Marland Kitchen.. 1 by mouth three times a day 6)  Miacalcin 200 Unit/ml Soln (Calcitonin (salmon)) .Marland Kitchen.. 1 spray in one nostril daily (switch nostrils every other day) 7)  Labetalol Hcl 100 Mg Tabs (Labetalol hcl) .... Take 1 by mouth once daily 8)  Clonidine Hcl 0.2 Mg Tabs (Clonidine hcl) .... 1/2 tab by mouth every am, 1/2 tab by mouth at lunch and 1 tab by mouth every pm. 9)  Nexium 40 Mg Cpdr (Esomeprazole magnesium) .... Take 1 capsule by mouth once a day 10)  Nitrostat 0.4 Mg Subl (Nitroglycerin) .Marland Kitchen.. 1 under tounge as needed chest pain  if not improved seek medical attention at er 11)  Calcium Carbonate-vitamin D 600-400 Mg-unit Tabs (Calcium carbonate-vitamin d) .... Take 1 tablet by mouth once a day 12)  Vitamin B-12 1000 Mcg Tabs (Cyanocobalamin) .... Take 1 tablet by mouth once a day 13)  Vitamin D 1000 Unit Tabs (Cholecalciferol) .... Take 1 tablet by mouth once a day 14)  Cyanocobalamin 1000 Mcg/ml Soln (Cyanocobalamin) .Marland Kitchen.. 1 shot monthly  Patient Instructions: 1)  please send for last Dexa from  imaging- should have been last year  2)  check sugar 2-3 days in afternoon just to keep track (optimally 2 hours after meal )- goal is 140 or below  3)  we will schedule mammogram at check out  4)  continue calcium and vitamin D 5)  keep working on diabetic diet  6)  schedule labs for [facility] and glucose (non fasting) in 3 months and then follow up    Orders Added: 1)  Radiology Referral [Radiology] 2)  Est. Patient Level IV [16109]    Current Allergies (reviewed today): ! * ISOSORBIDE ! PROCARDIA ! * NOVICAINE ! FOSAMAX ! FLAGYL ! * SHELLFISH

## 2010-12-01 LAB — GLUCOSE, CAPILLARY
Glucose-Capillary: 113 mg/dL — ABNORMAL HIGH (ref 70–99)
Glucose-Capillary: 116 mg/dL — ABNORMAL HIGH (ref 70–99)

## 2010-12-05 ENCOUNTER — Encounter: Payer: Self-pay | Admitting: Family Medicine

## 2010-12-05 DIAGNOSIS — D518 Other vitamin B12 deficiency anemias: Secondary | ICD-10-CM

## 2010-12-09 ENCOUNTER — Ambulatory Visit: Payer: Self-pay | Admitting: Family Medicine

## 2010-12-09 LAB — HM MAMMOGRAPHY

## 2010-12-14 ENCOUNTER — Ambulatory Visit: Payer: Self-pay | Admitting: Family Medicine

## 2010-12-15 NOTE — Miscellaneous (Signed)
Summary: Vitamin B-12  Clinical Lists Changes  Orders: Added new Service order of Vit B12 1000 mcg (J3420) - Signed        Medication Administration  Injection # 1:    Medication: Vit B12 1000 mcg    Diagnosis: ANEMIA, VITAMIN B12 DEFICIENCY (ICD-281.1)    Route: IM    Site: L deltoid    Exp Date: 08/17/2012    Lot #: [id number]    Mfr: American Regent    Patient tolerated injection without complications    Given by: Delilah Shan CMA (AAMA) (November 27, 2010 8:53 AM)  Orders Added: 1)  Vit B12 1000 mcg [J3420]

## 2010-12-29 ENCOUNTER — Telehealth: Payer: Self-pay

## 2010-12-29 NOTE — Telephone Encounter (Signed)
Patient notified as instructed by telephone. Add'l views of mammogram shows faint calcifications which is reassuring but radiologist wants reck right breast mammogram in 6 months. Pt would like for Dr Milinda Antis to scheduled the 6 month f/u. [facility] is closed but will call in AM for appt.

## 2010-12-30 ENCOUNTER — Telehealth: Payer: Self-pay

## 2010-12-30 NOTE — Telephone Encounter (Signed)
Scheduled 6 mth f/u mammo of rt breast at [facility] spoke with Olegario Messier. Appt is scheduled for 06/19/11 at 11:00am. Pt notified as instructed. Report sent for scanning.

## 2010-12-30 NOTE — Telephone Encounter (Signed)
Pt already had add'l views of rt breast on 12/14/10. Pt notified by phone 12/14/10 results showed faint calcifications and radiologist request  6 mth f/u mammo of rt breast. I scheduled for 06/19/11 at 11:00am at [facility] and pt notified of appt by phone.

## 2011-01-02 ENCOUNTER — Other Ambulatory Visit: Payer: Self-pay | Admitting: Family Medicine

## 2011-01-02 DIAGNOSIS — R928 Other abnormal and inconclusive findings on diagnostic imaging of breast: Secondary | ICD-10-CM | POA: Insufficient documentation

## 2011-01-05 ENCOUNTER — Other Ambulatory Visit: Payer: Self-pay | Admitting: *Deleted

## 2011-01-05 ENCOUNTER — Encounter: Payer: Self-pay | Admitting: Family Medicine

## 2011-01-05 MED ORDER — ONETOUCH ULTRASOFT LANCETS MISC: Status: DC

## 2011-01-05 MED ORDER — GLUCOSE BLOOD VI STRP: ORAL_STRIP | Status: DC

## 2011-01-06 NOTE — Telephone Encounter (Signed)
Error in electronic transmission so I called [facility] [phone] spoke with Elnita Maxwell and gave rx.verbally.

## 2011-01-09 ENCOUNTER — Encounter: Payer: Self-pay | Admitting: Family Medicine

## 2011-01-11 NOTE — Telephone Encounter (Signed)
Lugene, Yes I called in both rx. Thank you,Rena

## 2011-01-26 ENCOUNTER — Other Ambulatory Visit: Payer: Self-pay | Admitting: *Deleted

## 2011-01-26 MED ORDER — CALCITONIN (SALMON) 200 UNIT/ACT NA SOLN: NASAL | Status: DC

## 2011-02-02 ENCOUNTER — Other Ambulatory Visit: Payer: Self-pay | Admitting: Family Medicine

## 2011-02-03 NOTE — Op Note (Signed)
NAME:  Bianca, Mueller                         ACCOUNT NO.:  [id number]   MEDICAL RECORD NO.:  [id number]                   PATIENT TYPE:  OIB   LOCATION:  2899                                 FACILITY:  [facility]   PHYSICIAN:  Balinda Quails, M.D.                 DATE OF BIRTH:  30-Mar-1926   DATE OF PROCEDURE:  04/06/2004  DATE OF DISCHARGE:                                 OPERATIVE REPORT   DIAGNOSIS:  Possible renovascular hypertension.   PROCEDURES:  1. Abdominal aortogram.  2. Bilateral selective renal arteriogram.  3. Bilateral renal artery aorta pressure gradient measurement.   PHYSICIAN:  Balinda Quails, M.D.   ACCESS:  Right femoral artery 5 French sheath.   CONTRAST:  50 mL Visipaque.   COMPLICATIONS:  None apparent.   CLINICAL NOTE:  Bianca Mueller is a 77 year old female referred for  evaluation of poorly-controlled hypertension.  She recently underwent a  workup for hypercalcemia and underwent Captopril renal study, which revealed  evidence of possible renal artery stenosis.  CT angiogram revealed evidence  of left renal artery stenosis and ostial calcification of the right renal  artery origin.  She was brought to the catheterization lab at this time for  diagnostic arteriography and possible percutaneous intervention.   PROCEDURE NOTE:  The patient was brought to the catheterization lab in  stable condition.  Placed in the supine position.  Administered 25 mcg of  fentanyl and 1 mg of Versed intravenously.  Skin and subcutaneous tissue in  the right groin instilled with 1% Xylocaine.  A needle was used to engage  the right common femoral artery.  A 0.035 J-wire passed through the needle  into the midabdominal aorta.  The needle removed, a 5 French sheath advanced  over the dilator.  The dilator removed and the sheath flushed with heparin-  saline solution.   A standard pigtail catheter was advanced over the guidewire to the  midabdominal aorta.  Standard AP  midabdominal aortogram obtained.  There  were single renal arteries bilaterally.  The proximal right renal artery did  reveal ostial calcification.  The left renal artery origin was patent, just  beyond its origin there was an irregular plaque estimated to be 40-50%  stenotic.  The infrarenal aorta revealed mild to moderate plaque disease but  was widely patent.  The common iliac arteries bilaterally revealed normal  flow.  The hypogastric and external iliac arteries were also normal in  caliber.   A catheter exchange then made over a J-wire for a Cobra 2 catheter.  This  was engaged easily into the origin of the left renal artery.  With hand  injection, left renal arteriography was obtained.  This again verified  widely patent proximal left renal artery.  There was an irregular stenosis  just beyond the origin of the left renal artery estimated to be 40-50%.  A  0.014 stabilizer guidewire was  then advanced across the stenosis.  A 4  French straight end-hole catheter was then advanced over the guidewire.  A  pullback pressure gradient was then measured across the left renal artery  lesion.  This revealed a maximum gradient of 10 mmHg.  This was not felt to  be significant.  Therefore, an intervention was not undertaken.   Attention was then placed on the right renal artery origin.  There was  calcified plaque at the ostium of the right renal artery.  The C2 catheter  was advanced across the origin of the right renal artery.  Right renal  artery carried out with hand injection.  This did reveal a plaque at the  origin of the right renal artery with what appeared to be approximately a  50% stenosis.  A 0.014 stabilizer guidewire was advanced through the  catheter and advanced across the right renal artery origin.  A 4 French  sheath, end-hole catheter advanced over the guidewire and a pressure  gradient measured across the right renal artery origin.  This revealed a  pressure gradient of  10-15 mmHg.  This was not felt to be significant.   This completed the diagnostic procedure.  The renal artery lesions which  were identified were not felt to be significant enough to require  percutaneous intervention.  The guidewire was reinserted, the catheter  removed.  Right femoral sheath removed.  No apparent complication.  Total  contrast 50 mL Visipaque.   FINAL IMPRESSION:  1. Bilateral 40-50% renal artery stenosis.  Pressure gradient measurements     not felt to be significant to account for renovascular hypertension.  No     percutaneous intervention undertaken.                                               Balinda Quails, M.D.    PGH/MEDQ  D:  04/06/2004  T:  04/06/2004  Job:  [id number]   cc:   [facility]   [facility] A. Milinda Antis, M.D. [facility]   Alan Mulder, M.D.  [address]  [place]  [place] [address]  Fax: [phone]

## 2011-02-04 ENCOUNTER — Encounter: Payer: Self-pay | Admitting: Family Medicine

## 2011-02-06 ENCOUNTER — Other Ambulatory Visit: Payer: PRIVATE HEALTH INSURANCE

## 2011-02-07 ENCOUNTER — Other Ambulatory Visit (INDEPENDENT_AMBULATORY_CARE_PROVIDER_SITE_OTHER): Payer: Medicare Other | Admitting: Family Medicine

## 2011-02-07 DIAGNOSIS — R7309 Other abnormal glucose: Secondary | ICD-10-CM

## 2011-02-07 LAB — GLUCOSE, RANDOM: Glucose, Bld: 123 mg/dL — ABNORMAL HIGH (ref 70–99)

## 2011-02-14 ENCOUNTER — Ambulatory Visit (INDEPENDENT_AMBULATORY_CARE_PROVIDER_SITE_OTHER): Payer: Medicare Other | Admitting: Family Medicine

## 2011-02-14 ENCOUNTER — Encounter: Payer: Self-pay | Admitting: Family Medicine

## 2011-02-14 DIAGNOSIS — E119 Type 2 diabetes mellitus without complications: Secondary | ICD-10-CM | POA: Insufficient documentation

## 2011-02-14 DIAGNOSIS — R002 Palpitations: Secondary | ICD-10-CM

## 2011-02-14 DIAGNOSIS — E7800 Pure hypercholesterolemia, unspecified: Secondary | ICD-10-CM

## 2011-02-14 DIAGNOSIS — I10 Essential (primary) hypertension: Secondary | ICD-10-CM

## 2011-02-14 NOTE — Progress Notes (Signed)
Subjective:    Patient ID: Bianca Mueller, female    DOB: 1926/07/17, 77 y.o.   MRN: [id number]  HPI Here for f/u of DM  Last visit a1c up to 7.0-- had been eating too many starchy foods for a while Promised to change her diet  Wt is up 4 lb today Improved a1c of 6.9 today On metformin Blood sugars are labile -- up and down a bit  115-120 in the am average  140s in the afternoons  Feels like she is getting back to normal with diet and trying to stay active  Feeling good overall  opthy ok in December  Next follow up in June   Last week had quick episode of palpitations   Had just picked some peas and sat down to shell them  No chest pain or dizziness or other symptoms  It was so brief- did not tell her heart doctor (Dr Lady Gary )   Past Medical History  Diagnosis Date  . Diabetes mellitus     type II  . Osteoporosis   . Hyperlipidemia   . Hypertension   . Hypercalcemia   . CAD (coronary artery disease)   . Zoster     past  . Diverticulosis   . Hx of adenomatous colonic polyps 2010    last colonscopy 2010,Dr Mechele Collin - no need for further  colonoscopy given age    History   Social History  . Marital Status: Married    Spouse Name: N/A    Number of Children: N/A  . Years of Education: N/A   Occupational History  . Not on file.   Social History Main Topics  . Smoking status: Former Games developer  . Smokeless tobacco: Not on file  . Alcohol Use: No  . Drug Use:   . Sexually Active:    Other Topics Concern  . Not on file   Social History Narrative  . No narrative on file      Review of Systems Review of Systems  Constitutional: Negative for fever, appetite change, fatigue and unexpected weight change.  Eyes: Negative for pain and visual disturbance.  Respiratory: Negative for cough and shortness of breath.   Cardiovascular: Negative for cp or sob - no further palpitations Gastrointestinal: Negative for nausea, diarrhea and constipation.  Genitourinary:  Negative for urgency and frequency.  Skin: Negative for pallor or rash .  Neurological: Negative for weakness, light-headedness, numbness and headaches.  Hematological: Negative for adenopathy. Does not bruise/bleed easily.  Psychiatric/Behavioral: Negative for dysphoric mood. The patient is not nervous/anxious.          Objective:   Physical Exam  Constitutional: She appears well-developed and well-nourished. No distress.       Slim and well appearing   HENT:  Head: Normocephalic and atraumatic.  Mouth/Throat: Oropharynx is clear and moist.  Eyes: Conjunctivae and EOM are normal. Pupils are equal, round, and reactive to light.  Neck: Normal range of motion. Neck supple. No JVD present. No thyromegaly present.  Cardiovascular: Normal rate, regular rhythm, normal heart sounds and intact distal pulses.   Pulmonary/Chest: Effort normal and breath sounds normal. No respiratory distress. She has no wheezes.  Abdominal: Soft. Bowel sounds are normal. There is no tenderness.  Musculoskeletal: She exhibits no edema and no tenderness.       Some hyperemia of feet and ankles without edema Warm to the touch  Lymphadenopathy:    She has no cervical adenopathy.  Skin: Skin is warm and dry. No  rash noted. No pallor.  Psychiatric: She has a normal mood and affect.          Assessment & Plan:

## 2011-02-14 NOTE — Patient Instructions (Addendum)
Sugar control is better  Keep working on diet No change in medicines  Schedule labs and then follow up in about 3 months  Make sure to let Dr Lady Gary know about your episode of palpitations - especially if it occurs again

## 2011-02-14 NOTE — Assessment & Plan Note (Signed)
a1c is now down to 6.9- is at goal Rev low glycemic diet opthy is up to date and will have another exam in June  Adv to stay active- which she does Will check labs in 3 months and then f/u (incl lipid and renal as well)

## 2011-02-16 DIAGNOSIS — R002 Palpitations: Secondary | ICD-10-CM | POA: Insufficient documentation

## 2011-02-16 NOTE — Assessment & Plan Note (Signed)
Very brief episode after quick change in position without other symptoms Adv pt to let her cardiologist know Also to call/ seek attention if it happens again

## 2011-03-06 ENCOUNTER — Other Ambulatory Visit: Payer: Self-pay | Admitting: Family Medicine

## 2011-03-31 ENCOUNTER — Other Ambulatory Visit: Payer: Self-pay | Admitting: Family Medicine

## 2011-03-31 NOTE — Telephone Encounter (Signed)
[facility] [address] electronically request refill Simvastatin 20mg  #30 with 3 refills. Pt already has appt with Dr Milinda Antis 05/17/11.

## 2011-04-27 ENCOUNTER — Other Ambulatory Visit: Payer: Self-pay | Admitting: Family Medicine

## 2011-04-27 NOTE — Telephone Encounter (Signed)
[facility] [address] electronically request refill for Zocor 20mg . Refilled electronically on 03/31/11 #30 x3 refills to [facility] [address].

## 2011-05-12 ENCOUNTER — Other Ambulatory Visit (INDEPENDENT_AMBULATORY_CARE_PROVIDER_SITE_OTHER): Payer: Medicare Other | Admitting: Family Medicine

## 2011-05-12 DIAGNOSIS — E785 Hyperlipidemia, unspecified: Secondary | ICD-10-CM

## 2011-05-12 DIAGNOSIS — I10 Essential (primary) hypertension: Secondary | ICD-10-CM

## 2011-05-12 DIAGNOSIS — E119 Type 2 diabetes mellitus without complications: Secondary | ICD-10-CM

## 2011-05-12 DIAGNOSIS — E7800 Pure hypercholesterolemia, unspecified: Secondary | ICD-10-CM

## 2011-05-12 LAB — RENAL FUNCTION PANEL
Albumin: 4.1 g/dL (ref 3.5–5.2)
BUN: 17 mg/dL (ref 6–23)
Calcium: 9.3 mg/dL (ref 8.4–10.5)
Chloride: 101 mEq/L (ref 96–112)
Phosphorus: 3.8 mg/dL (ref 2.3–4.6)

## 2011-05-12 LAB — LIPID PANEL
Cholesterol: 125 mg/dL (ref 0–200)
Triglycerides: 140 mg/dL (ref 0.0–149.0)

## 2011-05-12 LAB — ALT: ALT: 12 U/L (ref 0–35)

## 2011-05-12 LAB — HEMOGLOBIN A1C: Hgb A1c MFr Bld: 7.2 % — ABNORMAL HIGH (ref 4.6–6.5)

## 2011-05-17 ENCOUNTER — Ambulatory Visit (INDEPENDENT_AMBULATORY_CARE_PROVIDER_SITE_OTHER): Payer: Medicare Other | Admitting: Family Medicine

## 2011-05-17 ENCOUNTER — Encounter: Payer: Self-pay | Admitting: Family Medicine

## 2011-05-17 ENCOUNTER — Telehealth: Payer: Self-pay | Admitting: *Deleted

## 2011-05-17 DIAGNOSIS — R627 Adult failure to thrive: Secondary | ICD-10-CM

## 2011-05-17 DIAGNOSIS — E119 Type 2 diabetes mellitus without complications: Secondary | ICD-10-CM

## 2011-05-17 NOTE — Assessment & Plan Note (Signed)
a1c is up because pt is eating fruit and mostly carbs  Disc protien intake - small freq meals Trial off metformin due to - ? Side eff of appetite problems  If this is the cause will need to investigate other DM treatments

## 2011-05-17 NOTE — Progress Notes (Signed)
Subjective:    Patient ID: Bianca Mueller, female    DOB: [birth year], 77 y.o.   MRN: [id number]  HPI Here for decrease in appetite and wt loss  Wt is down 10 lb bmi is 18  Started loosing appetite - when she started getting her B12 shots - over a year ago  Used to have vomiting and no longer does that  Does not really get hungry at all  Meat and some other foods turn her off - the smell  Eats a lot of fruit , pb crackers , not as much vegetables In terms of protien - PB and pinto beans , some cheese and crackers   She really thinks metformin is making her loose her appetite  Gets diarrhea about once per week  Stomach rolls a lot   Gets full after a few bites  No pain  No blood in stool   Does not think she is depressed Sleeps ok  Overall mood is good and she is not a worrier at all  Patient Active Problem List  Diagnoses  . UNSPECIFIED VITAMIN D DEFICIENCY  . HYPERCHOLESTEROLEMIA  . HYPERTENSION  . DIVERTICULITIS, COLON  . OSTEOPOROSIS  . PEDAL EDEMA  . SHELLFISH ALLERGY  . COLONIC POLYPS, HX OF  . ANEMIA, VITAMIN B12 DEFICIENCY  . Abnormal mammogram  . DM2 (diabetes mellitus, type 2)  . Palpitations  . Adult failure to thrive   Past Medical History  Diagnosis Date  . Diabetes mellitus     type II  . Osteoporosis   . Hyperlipidemia   . Hypertension   . Hypercalcemia   . CAD (coronary artery disease)   . Zoster     past  . Diverticulosis   . Hx of adenomatous colonic polyps 2010    last colonscopy 2010,Dr Mechele Collin - no need for further  colonoscopy given age   Past Surgical History  Procedure Date  . Abdominal hysterectomy     ovaries intact  . Shoulder surgery     right   History  Substance Use Topics  . Smoking status: Former Games developer  . Smokeless tobacco: Not on file  . Alcohol Use: No   Family History  Problem Relation Age of Onset  . Heart disease Mother     CAD  . Heart disease Father     CAD  . Heart disease Brother     CAD    Allergies  Allergen Reactions  . Alendronate Sodium   . Isosorbide   . Metronidazole     REACTION: sick/ GI  . Nifedipine    Current Outpatient Prescriptions on File Prior to Visit  Medication Sig Dispense Refill  . amLODipine (NORVASC) 5 MG tablet Take 5 mg by mouth at bedtime.        Marland Kitchen aspirin 325 MG tablet Take 325 mg by mouth daily.        . calcitonin, salmon, (MIACALCIN) 200 UNIT/ACT nasal spray Use one spray in one nostril daily (switch nostrils every other day)  3.7 mL  3  . Calcium Carbonate-Vit D-Min 600-400 MG-UNIT TABS Take 1 tablet by mouth daily.        . captopril-hydrochlorothiazide (CAPOZIDE) 25-15 MG per tablet Take 1 tablet by mouth 3 (three) times daily.        . cholecalciferol (VITAMIN D) 1000 UNITS tablet Take 1,000 Units by mouth daily.        . cloNIDine (CATAPRES) 0.2 MG tablet 1/2 tablet by mouth every AM, 1/2 tablet  by mouth at lunch and 1 tablet by mouth every PM.       . esomeprazole (NEXIUM) 40 MG capsule Take 40 mg by mouth daily before breakfast.        . glucose blood (ONE TOUCH ULTRA TEST) test strip Use as instructed.  Test blood sugar once daily or as needed.  250.00  100 each  4  . labetalol (NORMODYNE) 100 MG tablet TAKE ONE TABLET BY MOUTH EVERY DAY  30 tablet  2  . Lancets (ONETOUCH ULTRASOFT) lancets Use as instructed.  Check blood sugar once daily or as needed.  250.00  100 each  3  . metFORMIN (GLUCOPHAGE) 1000 MG tablet Take 1,000 mg by mouth 2 (two) times daily with a meal.        . simvastatin (ZOCOR) 20 MG tablet TAKE ONE TABLET BY MOUTH EVERY DAY  30 tablet  3  . vitamin B-12 (CYANOCOBALAMIN) 1000 MCG tablet Take 1,000 mcg by mouth daily.        . cyanocobalamin (,VITAMIN B-12,) 1000 MCG/ML injection Inject 1,000 mcg into the muscle every 30 (thirty) days.        . nitroGLYCERIN (NITROSTAT) 0.4 MG SL tablet Place 0.4 mg under the tongue every 5 (five) minutes as needed. For chest pain and if not improved seek medical attention in ER.               a1c up to 7.2 this check    Review of Systems Review of Systems  Constitutional: Negative for fever, appetite change, fatigue and unexpected weight change.  Eyes: Negative for pain and visual disturbance.  Respiratory: Negative for cough and shortness of breath.   Cardiovascular: Negative.for cp or palpitations    Gastrointestinal: Negative for nausea, diarrhea and constipation.  Genitourinary: Negative for urgency and frequency.  Skin: Negative for pallor. or rash  Neurological: Negative for weakness, light-headedness, numbness and headaches.  Hematological: Negative for adenopathy. Does not bruise/bleed easily.  Psychiatric/Behavioral: Negative for dysphoric mood. The patient is not nervous/anxious.          Objective:   Physical Exam  Constitutional: She appears well-developed and well-nourished.       Slim and well appearing elderly female   HENT:  Head: Normocephalic and atraumatic.  Mouth/Throat: Oropharynx is clear and moist.  Eyes: Conjunctivae and EOM are normal. Pupils are equal, round, and reactive to light. No scleral icterus.  Neck: Normal range of motion. Neck supple. No JVD present. Carotid bruit is not present. No thyromegaly present.  Cardiovascular: Normal rate, regular rhythm, normal heart sounds and intact distal pulses.   Pulmonary/Chest: Effort normal and breath sounds normal. No respiratory distress. She has no wheezes.  Abdominal: Soft. Bowel sounds are normal. She exhibits no distension, no abdominal bruit and no mass. There is no tenderness. There is no guarding.  Musculoskeletal: Normal range of motion. She exhibits no edema and no tenderness.  Lymphadenopathy:    She has no cervical adenopathy.  Neurological: She is alert. She has normal reflexes. No cranial nerve deficit. Coordination normal.       No tremor   Skin: Skin is warm and dry. No rash noted. No erythema. No pallor.  Psychiatric: She has a normal mood and affect.           Assessment & Plan:

## 2011-05-17 NOTE — Patient Instructions (Signed)
Stop the metformin for 2-3 weeks - watch sugars - they will be high  I want you to get more protien -- with every meal  Dairy/ peanut butter / eggs / meat if you want it / nuts / tofu or soy based products/ and beans  Aim  For 5-6 small meals per day  glucerna (over the counter) is a good diabetic meal supplement if you are not hungry  Follow up with me in 3 weeks

## 2011-05-18 NOTE — Telephone Encounter (Signed)
Mom has signed for me to have access to her medical records.  We are concerned about her weight loss and her A1C.  She seemed to be doing better when she got the B12 injections but then when she finished them and went onto oral B12, she seems not to be doing as well with eating.  Any advice on that?  I know she is convinced it is her Metformin but she has been on Metformin for a long time.

## 2011-05-18 NOTE — Telephone Encounter (Signed)
She told me she thought the B12 (shots or oral supplementation ) took away her appetite in general- so talk to her about that -- and I am happy to go back to shots if she wants to  She was also quite certain that her metformin took away her appetite (which I have never seen happen before - but wanted to test it out) She 100% denied the idea of depression or anxiety - which is re assuring It may also just be age related  If the metformin experiment does not work out we may need to consider some medication to inc her appetite-  Keep me posted

## 2011-05-26 ENCOUNTER — Other Ambulatory Visit: Payer: Self-pay | Admitting: Family Medicine

## 2011-05-29 NOTE — Telephone Encounter (Signed)
[facility] [address] electronically request refill Labetalol 100 mg. #30 x 3.

## 2011-06-07 ENCOUNTER — Ambulatory Visit (INDEPENDENT_AMBULATORY_CARE_PROVIDER_SITE_OTHER): Payer: Medicare Other | Admitting: Family Medicine

## 2011-06-07 ENCOUNTER — Encounter: Payer: Self-pay | Admitting: Family Medicine

## 2011-06-07 DIAGNOSIS — R627 Adult failure to thrive: Secondary | ICD-10-CM

## 2011-06-07 DIAGNOSIS — D518 Other vitamin B12 deficiency anemias: Secondary | ICD-10-CM

## 2011-06-07 DIAGNOSIS — Z23 Encounter for immunization: Secondary | ICD-10-CM

## 2011-06-07 DIAGNOSIS — E119 Type 2 diabetes mellitus without complications: Secondary | ICD-10-CM

## 2011-06-07 MED ORDER — NITROGLYCERIN 0.4 MG SL SUBL: 0.4000 mg | SUBLINGUAL_TABLET | SUBLINGUAL | Status: DC | PRN

## 2011-06-07 MED ORDER — GLIPIZIDE ER 5 MG PO TB24: 5.0000 mg | ORAL_TABLET | Freq: Every day | ORAL | Status: DC

## 2011-06-07 NOTE — Patient Instructions (Signed)
Stay off metformin since you feel better off of it  I'm glad your appetite is back  Flu shot today For diabetes start glipizide xl 5 mg in am with breakfast Do not skip meals as this medicine can cause low sugar Check sugar twice daily -- in am fasting and then again 2 hours after afternoon meal  Schedule labs in 2 months and then follow up   Continue oral B12 supplement  Continue better diet with protein

## 2011-06-07 NOTE — Progress Notes (Signed)
Subjective:    Patient ID: Bianca Mueller, female    DOB: 1925-11-28, 77 y.o.   MRN: [id number]  HPI Here for f/u of DM and adult failure to thrive (wt loss) and B12 def Feels good   pts wt is stable today- that is re assuring  Last visit thought a1c was up (7.2) due to eating mostly carbs Disc diet with small frequent meals with protien (she is doing that well with nuts and dried beans , some dairy and occasional meats - a few hamburgers and hot dogs) , also drinks boost  Pt was susp that metformin decreased her appetite so held it  Since stopping metformin -- appetite is much better   Sugars are high -- 188 highest , this am 160s  In am usually 150s- 160s  About the same when she checks it in the afternoons   Has to have a tooth pulled Monday  May have infx - she is on abx   Needs update on her nitroglycerin   Wants flu shot today  Patient Active Problem List  Diagnoses  . UNSPECIFIED VITAMIN D DEFICIENCY  . HYPERCHOLESTEROLEMIA  . HYPERTENSION  . DIVERTICULITIS, COLON  . OSTEOPOROSIS  . PEDAL EDEMA  . SHELLFISH ALLERGY  . COLONIC POLYPS, HX OF  . ANEMIA, VITAMIN B12 DEFICIENCY  . Abnormal mammogram  . DM2 (diabetes mellitus, type 2)  . Palpitations  . Adult failure to thrive   Past Medical History  Diagnosis Date  . Diabetes mellitus     type II  . Osteoporosis   . Hyperlipidemia   . Hypertension   . Hypercalcemia   . CAD (coronary artery disease)   . Zoster     past  . Diverticulosis   . Hx of adenomatous colonic polyps 2010    last colonscopy 2010,Dr Mechele Collin - no need for further  colonoscopy given age   Past Surgical History  Procedure Date  . Abdominal hysterectomy     ovaries intact  . Shoulder surgery     right   History  Substance Use Topics  . Smoking status: Former Games developer  . Smokeless tobacco: Not on file  . Alcohol Use: No   Family History  Problem Relation Age of Onset  . Heart disease Mother     CAD  . Heart disease Father    CAD  . Heart disease Brother     CAD   Allergies  Allergen Reactions  . Alendronate Sodium   . Isosorbide   . Metformin And Related     Decreases appetite resulting in weight loss  . Metronidazole     REACTION: sick/ GI  . Nifedipine    Current Outpatient Prescriptions on File Prior to Visit  Medication Sig Dispense Refill  . amLODipine (NORVASC) 5 MG tablet Take 5 mg by mouth at bedtime.        Marland Kitchen aspirin 325 MG tablet Take 325 mg by mouth daily.        . calcitonin, salmon, (MIACALCIN) 200 UNIT/ACT nasal spray Use one spray in one nostril daily (switch nostrils every other day)  3.7 mL  3  . Calcium Carbonate-Vit D-Min 600-400 MG-UNIT TABS Take 1 tablet by mouth daily.        . captopril-hydrochlorothiazide (CAPOZIDE) 25-15 MG per tablet Take 1 tablet by mouth 3 (three) times daily.        . cholecalciferol (VITAMIN D) 1000 UNITS tablet Take 1,000 Units by mouth daily.        Marland Kitchen  cloNIDine (CATAPRES) 0.2 MG tablet 1/2 tablet by mouth every AM, 1/2 tablet by mouth at lunch and 1 tablet by mouth every PM.       . esomeprazole (NEXIUM) 40 MG capsule Take 40 mg by mouth daily before breakfast.        . glucose blood (ONE TOUCH ULTRA TEST) test strip Use as instructed.  Test blood sugar once daily or as needed.  250.00  100 each  4  . labetalol (NORMODYNE) 100 MG tablet TAKE ONE TABLET BY MOUTH EVERY DAY  30 tablet  3  . Lancets (ONETOUCH ULTRASOFT) lancets Use as instructed.  Check blood sugar once daily or as needed.  250.00  100 each  3  . simvastatin (ZOCOR) 20 MG tablet TAKE ONE TABLET BY MOUTH EVERY DAY  30 tablet  3  . vitamin B-12 (CYANOCOBALAMIN) 1000 MCG tablet Take 1,000 mcg by mouth daily.        . cyanocobalamin (,VITAMIN B-12,) 1000 MCG/ML injection Inject 1,000 mcg into the muscle every 30 (thirty) days.               Also daughter is concerned that she may do better with B12 shots than oral supplementation Pt does not feel any different  She does not want to return to  shots at the current time       Review of Systems Review of Systems  Constitutional: Negative for fever,, fatigue and unexpected weight change. pos for improved appetite  Eyes: Negative for pain and visual disturbance.  Respiratory: Negative for cough and shortness of breath.   Cardiovascular: Negative for cp or palpitations    Gastrointestinal: Negative for nausea, diarrhea and constipation.  Genitourinary: Negative for urgency and frequency.  Skin: Negative for pallor or rash   Neurological: Negative for weakness, light-headedness, numbness and headaches.  Hematological: Negative for adenopathy. Does not bruise/bleed easily.  Psychiatric/Behavioral: Negative for dysphoric mood. The patient is not nervous/anxious.          Objective:   Physical Exam  Constitutional: She appears well-developed and well-nourished. No distress.  HENT:  Head: Normocephalic and atraumatic.  Mouth/Throat: Oropharynx is clear and moist.  Eyes: Conjunctivae and EOM are normal. Pupils are equal, round, and reactive to light. No scleral icterus.  Neck: Normal range of motion. Neck supple. No JVD present. No thyromegaly present.  Cardiovascular: Normal rate, regular rhythm, normal heart sounds and intact distal pulses.   Pulmonary/Chest: Effort normal and breath sounds normal. No respiratory distress. She has no wheezes.  Abdominal: Soft. Bowel sounds are normal. She exhibits no distension and no mass. There is no tenderness.  Musculoskeletal: She exhibits no edema.  Lymphadenopathy:    She has no cervical adenopathy.  Neurological: She is alert. She has normal reflexes. No cranial nerve deficit. Coordination normal.       No tremor   Skin: Skin is warm and dry. No rash noted. No erythema. No pallor.  Psychiatric: She has a normal mood and affect.       Cheerful today          Assessment & Plan:

## 2011-06-07 NOTE — Assessment & Plan Note (Signed)
Per pt appetite is much improved and eating much better off metformin  She thinks this was the cause of reduced appetite Will follow closely Expect some wt gain- is stable now F/u 2 mo

## 2011-06-07 NOTE — Assessment & Plan Note (Signed)
Pt states she feels good with oral suppl and does not want to go back to shots unless needed Will check b12 level with lab in 2 mo and disc at f/u

## 2011-06-07 NOTE — Assessment & Plan Note (Signed)
Sugars up off metformin as expected Made change to glucotrol xl 5 since she is intol of metformin Diet is better with some protein now  Check sugar bid a1c and f/u 2 mo  Disc s/s hypoglycemia

## 2011-06-19 ENCOUNTER — Encounter: Payer: Self-pay | Admitting: Family Medicine

## 2011-06-19 ENCOUNTER — Ambulatory Visit: Payer: Self-pay | Admitting: Family Medicine

## 2011-06-30 ENCOUNTER — Other Ambulatory Visit: Payer: Self-pay | Admitting: Gastroenterology

## 2011-07-11 ENCOUNTER — Encounter: Payer: Self-pay | Admitting: *Deleted

## 2011-07-25 ENCOUNTER — Other Ambulatory Visit: Payer: Self-pay | Admitting: Family Medicine

## 2011-07-30 ENCOUNTER — Other Ambulatory Visit: Payer: Self-pay | Admitting: Family Medicine

## 2011-07-31 ENCOUNTER — Other Ambulatory Visit: Payer: Medicare Other

## 2011-07-31 NOTE — Telephone Encounter (Signed)
[facility] [address] request refill Zocor 20 mg #30 x1. Pt already scheduled for appt 08/07/11.

## 2011-08-01 ENCOUNTER — Other Ambulatory Visit: Payer: Medicare Other

## 2011-08-02 ENCOUNTER — Other Ambulatory Visit (INDEPENDENT_AMBULATORY_CARE_PROVIDER_SITE_OTHER): Payer: Medicare Other

## 2011-08-02 DIAGNOSIS — D518 Other vitamin B12 deficiency anemias: Secondary | ICD-10-CM

## 2011-08-02 DIAGNOSIS — E119 Type 2 diabetes mellitus without complications: Secondary | ICD-10-CM

## 2011-08-02 LAB — VITAMIN B12: Vitamin B-12: >1500 pg/mL — ABNORMAL HIGH (ref 211–911)

## 2011-08-07 ENCOUNTER — Ambulatory Visit (INDEPENDENT_AMBULATORY_CARE_PROVIDER_SITE_OTHER): Payer: Medicare Other | Admitting: Family Medicine

## 2011-08-07 ENCOUNTER — Encounter: Payer: Self-pay | Admitting: Family Medicine

## 2011-08-07 VITALS — BP 120/70 | HR 80 | Temp 97.8°F | Ht 62.0 in | Wt 105.2 lb

## 2011-08-07 DIAGNOSIS — D518 Other vitamin B12 deficiency anemias: Secondary | ICD-10-CM

## 2011-08-07 DIAGNOSIS — E119 Type 2 diabetes mellitus without complications: Secondary | ICD-10-CM

## 2011-08-07 DIAGNOSIS — E559 Vitamin D deficiency, unspecified: Secondary | ICD-10-CM

## 2011-08-07 DIAGNOSIS — J209 Acute bronchitis, unspecified: Secondary | ICD-10-CM | POA: Insufficient documentation

## 2011-08-07 DIAGNOSIS — I10 Essential (primary) hypertension: Secondary | ICD-10-CM

## 2011-08-07 DIAGNOSIS — R627 Adult failure to thrive: Secondary | ICD-10-CM

## 2011-08-07 MED ORDER — AZITHROMYCIN 250 MG PO TABS: ORAL_TABLET | ORAL | Status: AC

## 2011-08-07 NOTE — Assessment & Plan Note (Signed)
bp in fair control at this time  No changes needed  Disc lifstyle change with low sodium diet and exercise  F/u 6 mo 

## 2011-08-07 NOTE — Assessment & Plan Note (Signed)
Stable with glucotrol xl low dose -  Lab Results  Component Value Date   HGBA1C 7.1* 08/02/2011    Pt checks bid Will continue healthy diet and no change in med Lab and f/u 6 mo

## 2011-08-07 NOTE — Progress Notes (Signed)
Subjective:    Patient ID: Bianca Mueller, female    DOB: January 11, 1926, 77 y.o.   MRN: [id number]  HPI Here for f/u of failure to thrive/ DM an dB12 def  Also has uri symptoms for 3 weeks  [place]- has had symptoms for 3 weeks  Is having a wet sounding cough- occ phlegm-- is colored green at times  No wheeze or fever  Nose is congested No ear or throat pain  No facial pain   DM - now on glucotrol xl  Metformin caused wt loss Wt is up 4 lb-- great! a1c is 7.1- fairly stable No problems with glucotrol - no low sugars  Checks sugar am around 111-113 and then after lunch is 89 or above ( checks bid)  Is happy she gained some wt -- but does not to get "too big" Is eating a healthy diet and feels good  Drinks glucerna and snack bar if needed   Vit B 12 level is fine - over 1500 Does not need shots - has not had any   Patient Active Problem List  Diagnoses  . UNSPECIFIED VITAMIN D DEFICIENCY  . HYPERCHOLESTEROLEMIA  . HYPERTENSION  . DIVERTICULITIS, COLON  . OSTEOPOROSIS  . PEDAL EDEMA  . SHELLFISH ALLERGY  . COLONIC POLYPS, HX OF  . ANEMIA, VITAMIN B12 DEFICIENCY  . Abnormal mammogram  . DM2 (diabetes mellitus, type 2)  . Palpitations  . Adult failure to thrive  . Bronchitis, acute   Past Medical History  Diagnosis Date  . Diabetes mellitus     type II  . Osteoporosis   . Hyperlipidemia   . Hypertension   . Hypercalcemia   . CAD (coronary artery disease)   . Zoster     past  . Diverticulosis   . Hx of adenomatous colonic polyps 2010    last colonscopy 2010,Dr Mechele Collin - no need for further  colonoscopy given age   Past Surgical History  Procedure Date  . Abdominal hysterectomy     ovaries intact  . Shoulder surgery     right   History  Substance Use Topics  . Smoking status: Former Games developer  . Smokeless tobacco: Not on file  . Alcohol Use: No   Family History  Problem Relation Age of Onset  . Heart disease Mother     CAD  . Heart disease Father     CAD   . Heart disease Brother     CAD   Allergies  Allergen Reactions  . Alendronate Sodium   . Isosorbide   . Metformin And Related     Decreases appetite resulting in weight loss  . Metronidazole     REACTION: sick/ GI  . Nifedipine    Current Outpatient Prescriptions on File Prior to Visit  Medication Sig Dispense Refill  . amLODipine (NORVASC) 5 MG tablet Take 5 mg by mouth at bedtime.        Marland Kitchen aspirin 325 MG tablet Take 325 mg by mouth daily.        . calcitonin, salmon, (MIACALCIN) 200 UNIT/ACT nasal spray Use one spray in one nostril daily (switch nostrils every other day)  3.7 mL  3  . Calcium Carbonate-Vit D-Min 600-400 MG-UNIT TABS Take 1 tablet by mouth daily.        . captopril-hydrochlorothiazide (CAPOZIDE) 25-15 MG per tablet TAKE ONE TABLET BY MOUTH THREE TIMES DAILY  90 tablet  3  . cholecalciferol (VITAMIN D) 1000 UNITS tablet Take 1,000 Units by mouth  daily.        . cloNIDine (CATAPRES) 0.2 MG tablet 1/2 tablet by mouth every AM, 1/2 tablet by mouth at lunch and 1 tablet by mouth every PM.       . glipiZIDE (GLUCOTROL XL) 5 MG 24 hr tablet Take 1 tablet (5 mg total) by mouth daily.  30 tablet  11  . glucose blood (ONE TOUCH ULTRA TEST) test strip Use as instructed.  Test blood sugar once daily or as needed.  250.00  100 each  4  . labetalol (NORMODYNE) 100 MG tablet TAKE ONE TABLET BY MOUTH EVERY DAY  30 tablet  3  . Lancets (ONETOUCH ULTRASOFT) lancets Use as instructed.  Check blood sugar once daily or as needed.  250.00  100 each  3  . NEXIUM 40 MG capsule TAKE ONE CAPSULE BY MOUTH EVERY DAY  30 each  11  . simvastatin (ZOCOR) 20 MG tablet TAKE ONE TABLET BY MOUTH EVERY DAY  30 tablet  1  . vitamin B-12 (CYANOCOBALAMIN) 1000 MCG tablet Take 1,000 mcg by mouth daily.        . cyanocobalamin (,VITAMIN B-12,) 1000 MCG/ML injection Inject 1,000 mcg into the muscle every 30 (thirty) days.        . nitroGLYCERIN (NITROSTAT) 0.4 MG SL tablet Place 1 tablet (0.4 mg total)  under the tongue every 5 (five) minutes as needed. For chest pain and if not improved seek medical attention in ER.  15 tablet  0         Review of Systems     Objective:   Physical Exam  Constitutional: She appears well-developed and well-nourished. No distress.       Slim elderly female  HENT:  Head: Normocephalic and atraumatic.  Right Ear: External ear normal.  Left Ear: External ear normal.  Mouth/Throat: Oropharynx is clear and moist.       Nares are injected and congested  Some clear post nasal drip  No sinus tenderness  Eyes: Conjunctivae and EOM are normal. Pupils are equal, round, and reactive to light. No scleral icterus.  Neck: Normal range of motion. Neck supple. Normal carotid pulses and no JVD present. No thyromegaly present.  Cardiovascular: Normal rate, regular rhythm, normal heart sounds and intact distal pulses.  Exam reveals no gallop.   Pulmonary/Chest: Effort normal and breath sounds normal. No respiratory distress. She has no wheezes. She exhibits no tenderness.       Generally harsh bs- a few scattered rhonchi without rales or wheezes  Cough sounds junky    Abdominal: Soft. Bowel sounds are normal. She exhibits no distension and no abdominal bruit. There is no tenderness.  Musculoskeletal: Normal range of motion. She exhibits no edema and no tenderness.  Lymphadenopathy:    She has no cervical adenopathy.  Neurological: She is alert. She has normal reflexes. No cranial nerve deficit. She exhibits normal muscle tone. Coordination normal.  Skin: Skin is warm and dry. No rash noted. No erythema. No pallor.  Psychiatric: She has a normal mood and affect.          Assessment & Plan:

## 2011-08-07 NOTE — Assessment & Plan Note (Signed)
Much improved after stopping metformin Gained another 4 lb Eating well and supplements when needed Will continue to follow

## 2011-08-07 NOTE — Assessment & Plan Note (Signed)
For 3 weeks  Given duration of symptoms- cover with zithromax sympt care disc- see inst

## 2011-08-07 NOTE — Patient Instructions (Addendum)
Take zithromax pack for bronchitis and let me know if not improving in a week or so  Drink lots of fluids  mucinex is good for congestion over the counter Keep up healthy diet and good exercise  No change in medicines  Schedule fasting labs then follow up in 6 months

## 2011-08-07 NOTE — Assessment & Plan Note (Signed)
On otc tx Will check with 6 mo labs

## 2011-08-07 NOTE — Assessment & Plan Note (Signed)
Level is fine with oral suppl Will continue to follow No symptoms

## 2011-08-11 ENCOUNTER — Telehealth: Payer: Self-pay | Admitting: *Deleted

## 2011-08-11 NOTE — Telephone Encounter (Signed)
I realize that Mom is on the Z-Pak and it stays in her system for several more days but her cough is not getting any better and it is wearing her out.  I have some Tessalon Perles that I was given when I last had bronchitis.  Can she take those or can she have something for the cough?  She says she doesn't feel that bad but the cough is zapping her energy.  Karin Golden, [place].

## 2011-08-11 NOTE — Telephone Encounter (Signed)
The tessalon pearls are fine - remind her not to bite down on them, (swallow them whole)  Let me know if that is not helpful

## 2011-08-11 NOTE — Telephone Encounter (Signed)
Patient advised.

## 2011-09-20 LAB — HM DIABETES EYE EXAM

## 2011-09-24 ENCOUNTER — Other Ambulatory Visit: Payer: Self-pay | Admitting: Family Medicine

## 2011-09-26 NOTE — Telephone Encounter (Signed)
[facility] [address] request refill labetalol 100 mg #30 x 11 and Norvasc 5 mg #30 x 11.

## 2011-10-21 ENCOUNTER — Other Ambulatory Visit: Payer: Self-pay | Admitting: Family Medicine

## 2011-11-02 ENCOUNTER — Other Ambulatory Visit: Payer: Self-pay | Admitting: *Deleted

## 2011-11-02 MED ORDER — GLUCOSE BLOOD VI STRP: ORAL_STRIP | Status: DC

## 2011-11-23 ENCOUNTER — Other Ambulatory Visit: Payer: Self-pay | Admitting: Family Medicine

## 2012-02-02 ENCOUNTER — Other Ambulatory Visit (INDEPENDENT_AMBULATORY_CARE_PROVIDER_SITE_OTHER): Payer: Medicare Other

## 2012-02-02 DIAGNOSIS — E119 Type 2 diabetes mellitus without complications: Secondary | ICD-10-CM

## 2012-02-02 DIAGNOSIS — D518 Other vitamin B12 deficiency anemias: Secondary | ICD-10-CM

## 2012-02-02 DIAGNOSIS — I10 Essential (primary) hypertension: Secondary | ICD-10-CM

## 2012-02-02 DIAGNOSIS — E559 Vitamin D deficiency, unspecified: Secondary | ICD-10-CM

## 2012-02-02 LAB — COMPREHENSIVE METABOLIC PANEL
Albumin: 4 g/dL (ref 3.5–5.2)
Alkaline Phosphatase: 63 U/L (ref 39–117)
Calcium: 9.7 mg/dL (ref 8.4–10.5)
Chloride: 107 mEq/L (ref 96–112)
Glucose, Bld: 124 mg/dL — ABNORMAL HIGH (ref 70–99)
Potassium: 3.8 mEq/L (ref 3.5–5.1)
Sodium: 142 mEq/L (ref 135–145)
Total Protein: 7.4 g/dL (ref 6.0–8.3)

## 2012-02-02 LAB — LIPID PANEL
Cholesterol: 162 mg/dL (ref 0–200)
LDL Cholesterol: 80 mg/dL (ref 0–99)
Total CHOL/HDL Ratio: 3

## 2012-02-09 ENCOUNTER — Ambulatory Visit (INDEPENDENT_AMBULATORY_CARE_PROVIDER_SITE_OTHER): Payer: Medicare Other | Admitting: Family Medicine

## 2012-02-09 ENCOUNTER — Encounter: Payer: Self-pay | Admitting: Family Medicine

## 2012-02-09 VITALS — BP 120/64 | HR 68 | Temp 98.0°F | Ht 62.0 in | Wt 114.0 lb

## 2012-02-09 DIAGNOSIS — R627 Adult failure to thrive: Secondary | ICD-10-CM

## 2012-02-09 DIAGNOSIS — I10 Essential (primary) hypertension: Secondary | ICD-10-CM

## 2012-02-09 DIAGNOSIS — D518 Other vitamin B12 deficiency anemias: Secondary | ICD-10-CM

## 2012-02-09 DIAGNOSIS — E119 Type 2 diabetes mellitus without complications: Secondary | ICD-10-CM

## 2012-02-09 DIAGNOSIS — E7800 Pure hypercholesterolemia, unspecified: Secondary | ICD-10-CM

## 2012-02-09 NOTE — Assessment & Plan Note (Signed)
bp in fair control at this time  No changes needed  Disc lifstyle change with low sodium diet and exercise  Labs reviewed  

## 2012-02-09 NOTE — Progress Notes (Signed)
Subjective:    Patient ID: Bianca Mueller, female    DOB: Jun 29, 1926, 78 y.o.   MRN: [id number]  HPI Here for f/u of chronic conditions  Feels fine  Is glad to see weight is up  Is eating well   bp is 120/64 No cp or palpitations or headaches or edema  No side effects to medicines   BP Readings from Last 3 Encounters:  02/09/12 120/64  08/07/11 120/70  06/07/11 146/74     Wt is up 9 lb with bmi 20- no longer underweight That is great  Failure to thrive - imp off metformin   Diabetes Home sugar results- are just up a bit - usually runs about 140 - no low sugars but did have one day when she felt shaky  DM diet - overall very good  Exercise - getting more - working outdoors  [employer] at all  A1C last 7.3 - up a bit  No problems with medications on glipizide (off metformin) Renal protection-on ace  Last eye exam -last one was in the winter - and was ok , no retinop  Hyperlipidemia  On zocor and diet  Lab Results  Component Value Date   CHOL 162 02/02/2012   HDL 47.70 02/02/2012   LDLCALC 80 02/02/2012   LDLDIRECT 77.2 03/08/2010   TRIG 173.0* 02/02/2012   CHOLHDL 3 02/02/2012     B12 def Fine with oral supp Lab Results  Component Value Date   VITAMINB12 >1500* 02/02/2012      Patient Active Problem List  Diagnoses  . UNSPECIFIED VITAMIN D DEFICIENCY  . HYPERCHOLESTEROLEMIA  . HYPERTENSION  . DIVERTICULITIS, COLON  . OSTEOPOROSIS  . PEDAL EDEMA  . SHELLFISH ALLERGY  . COLONIC POLYPS, HX OF  . ANEMIA, VITAMIN B12 DEFICIENCY  . Abnormal mammogram  . DM2 (diabetes mellitus, type 2)  . Palpitations  . Adult failure to thrive  . Bronchitis, acute   Past Medical History  Diagnosis Date  . Diabetes mellitus     type II  . Osteoporosis   . Hyperlipidemia   . Hypertension   . Hypercalcemia   . CAD (coronary artery disease)   . Zoster     past  . Diverticulosis   . Hx of adenomatous colonic polyps 2010    last colonscopy 2010,Dr Mechele Collin - no need  for further  colonoscopy given age   Past Surgical History  Procedure Date  . Abdominal hysterectomy     ovaries intact  . Shoulder surgery     right   History  Substance Use Topics  . Smoking status: Former Games developer  . Smokeless tobacco: Not on file  . Alcohol Use: No   Family History  Problem Relation Age of Onset  . Heart disease Mother     CAD  . Heart disease Father     CAD  . Heart disease Brother     CAD   Allergies  Allergen Reactions  . Alendronate Sodium   . Isosorbide   . Metformin And Related     Decreases appetite resulting in weight loss  . Metronidazole     REACTION: sick/ GI  . Nifedipine    Current Outpatient Prescriptions on File Prior to Visit  Medication Sig Dispense Refill  . amLODipine (NORVASC) 5 MG tablet TAKE ONE TABLET BY MOUTH AT BEDTIME  30 tablet  11  . aspirin 325 MG tablet Take 325 mg by mouth daily.        . calcitonin, salmon, (  MIACALCIN) 200 UNIT/ACT nasal spray Use one spray in one nostril daily (switch nostrils every other day)  3.7 mL  3  . Calcium Carbonate-Vit D-Min 600-400 MG-UNIT TABS Take 1 tablet by mouth daily.        . captopril-hydrochlorothiazide (CAPOZIDE) 25-15 MG per tablet TAKE ONE TABLET BY MOUTH THREE TIMES DAILY  90 tablet  3  . cholecalciferol (VITAMIN D) 1000 UNITS tablet Take 1,000 Units by mouth daily.        . cloNIDine (CATAPRES) 0.2 MG tablet 1/2 tablet by mouth every AM, 1/2 tablet by mouth at lunch and 1 tablet by mouth every PM.       . cyanocobalamin (,VITAMIN B-12,) 1000 MCG/ML injection Inject 1,000 mcg into the muscle every 30 (thirty) days.        Marland Kitchen glipiZIDE (GLUCOTROL XL) 5 MG 24 hr tablet Take 1 tablet (5 mg total) by mouth daily.  30 tablet  11  . glucose blood (ONE TOUCH ULTRA TEST) test strip Test blood sugar once or twice daily.   Dx: 250.00  100 each  12  . labetalol (NORMODYNE) 100 MG tablet TAKE ONE TABLET BY MOUTH EVERY DAY  30 tablet  11  . Lancets (ONETOUCH ULTRASOFT) lancets Use as  instructed.  Check blood sugar once daily or as needed.  250.00  100 each  3  . NEXIUM 40 MG capsule TAKE ONE CAPSULE BY MOUTH EVERY DAY  30 each  11  . nitroGLYCERIN (NITROSTAT) 0.4 MG SL tablet Place 1 tablet (0.4 mg total) under the tongue every 5 (five) minutes as needed. For chest pain and if not improved seek medical attention in ER.  15 tablet  0  . simvastatin (ZOCOR) 20 MG tablet TAKE ONE TABLET BY MOUTH EVERY DAY  30 tablet  3  . vitamin B-12 (CYANOCOBALAMIN) 1000 MCG tablet Take 1,000 mcg by mouth daily.           Review of Systems Review of Systems  Constitutional: Negative for fever, appetite change, fatigue and unexpected weight change.  Eyes: Negative for pain and visual disturbance.  Respiratory: Negative for cough and shortness of breath.   Cardiovascular: Negative for cp or palpitations   pos for edema in ankles/ worse in L - in the summer/mild  Gastrointestinal: Negative for nausea, diarrhea and constipation.  Genitourinary: Negative for urgency and frequency.  Skin: Negative for pallor or rash   Neurological: Negative for weakness, light-headedness, numbness and headaches.  Hematological: Negative for adenopathy. Does not bruise/bleed easily.  Psychiatric/Behavioral: Negative for dysphoric mood. The patient is not nervous/anxious.         Objective:   Physical Exam  Constitutional: She appears well-developed and well-nourished. No distress.       Slim and well appearing elderly female   HENT:  Head: Normocephalic and atraumatic.  Mouth/Throat: Oropharynx is clear and moist.  Eyes: Conjunctivae and EOM are normal. Pupils are equal, round, and reactive to light. No scleral icterus.  Neck: Normal range of motion. Neck supple. No JVD present. Carotid bruit is not present. No thyromegaly present.  Cardiovascular: Normal rate, regular rhythm and normal heart sounds.  Exam reveals no gallop.   Pulmonary/Chest: Effort normal and breath sounds normal. No respiratory  distress. She has no wheezes.  Abdominal: Soft. Bowel sounds are normal. She exhibits no distension and no mass. There is no tenderness.  Musculoskeletal: She exhibits no edema.  Lymphadenopathy:    She has no cervical adenopathy.  Neurological: She is alert.  She has normal reflexes. No cranial nerve deficit. She exhibits normal muscle tone. Coordination normal.  Skin: Skin is warm and dry. No rash noted. No erythema. No pallor.  Psychiatric: She has a normal mood and affect.          Assessment & Plan:

## 2012-02-09 NOTE — Patient Instructions (Signed)
Your medical problems are stable  Keep active  Don't skip meals  Weight is better  Follow up in 6 months for annual exam with labs prior

## 2012-02-09 NOTE — Assessment & Plan Note (Signed)
Lipids well controlled with zocor and diet Disc goals for lipids and reasons to control them Rev labs with pt Rev low sat fat diet in detail  F/u 6 mo

## 2012-02-09 NOTE — Assessment & Plan Note (Signed)
No longer an issue with oral supplementation Rev levels Feels good

## 2012-02-09 NOTE — Assessment & Plan Note (Signed)
This is in fair control with glipizide alone  Disc s/s low sugar to watch for  a1c 7.3-will allow it to stay there to avoid low sugar Feels much better off metformin Disc diet  utd opthy

## 2012-02-11 ENCOUNTER — Other Ambulatory Visit: Payer: Self-pay | Admitting: Family Medicine

## 2012-03-19 ENCOUNTER — Other Ambulatory Visit: Payer: Self-pay | Admitting: Family Medicine

## 2012-03-19 NOTE — Telephone Encounter (Signed)
[facility] [address] request captopril HCTZ # 90 x 6.

## 2012-05-16 ENCOUNTER — Other Ambulatory Visit: Payer: Self-pay | Admitting: Family Medicine

## 2012-05-17 ENCOUNTER — Telehealth: Payer: Self-pay | Admitting: *Deleted

## 2012-05-17 DIAGNOSIS — Z1231 Encounter for screening mammogram for malignant neoplasm of breast: Secondary | ICD-10-CM | POA: Insufficient documentation

## 2012-05-17 MED ORDER — GLIPIZIDE ER 5 MG PO TB24: 5.0000 mg | ORAL_TABLET | Freq: Every day | ORAL | Status: DC

## 2012-05-17 NOTE — Telephone Encounter (Signed)
I did mam ref  Will refill electronically

## 2012-05-17 NOTE — Addendum Note (Signed)
Addended by: Roxy Manns A on: 05/17/2012 01:48 PM   Modules accepted: Orders

## 2012-05-17 NOTE — Telephone Encounter (Signed)
Request for Glipizide 5 mg 24 hr #30 11R. Sent to [facility]

## 2012-05-17 NOTE — Telephone Encounter (Signed)
Patient says she has received a letter from the [facility] in [place] saying that it is time to schedule her mammogram.  Please send an order to [facility].

## 2012-06-14 ENCOUNTER — Ambulatory Visit (INDEPENDENT_AMBULATORY_CARE_PROVIDER_SITE_OTHER): Payer: Medicare Other | Admitting: *Deleted

## 2012-06-14 DIAGNOSIS — Z23 Encounter for immunization: Secondary | ICD-10-CM

## 2012-06-24 ENCOUNTER — Ambulatory Visit: Payer: Self-pay | Admitting: Family Medicine

## 2012-06-27 ENCOUNTER — Encounter: Payer: Self-pay | Admitting: Family Medicine

## 2012-07-19 ENCOUNTER — Other Ambulatory Visit: Payer: Self-pay | Admitting: Gastroenterology

## 2012-07-19 ENCOUNTER — Other Ambulatory Visit: Payer: Self-pay | Admitting: Family Medicine

## 2012-08-01 ENCOUNTER — Other Ambulatory Visit: Payer: Self-pay | Admitting: *Deleted

## 2012-08-01 NOTE — Telephone Encounter (Signed)
Opened in error

## 2012-08-04 ENCOUNTER — Telehealth: Payer: Self-pay | Admitting: Family Medicine

## 2012-08-04 DIAGNOSIS — M810 Age-related osteoporosis without current pathological fracture: Secondary | ICD-10-CM

## 2012-08-04 DIAGNOSIS — E119 Type 2 diabetes mellitus without complications: Secondary | ICD-10-CM

## 2012-08-04 DIAGNOSIS — E559 Vitamin D deficiency, unspecified: Secondary | ICD-10-CM

## 2012-08-04 DIAGNOSIS — I10 Essential (primary) hypertension: Secondary | ICD-10-CM

## 2012-08-04 DIAGNOSIS — E7800 Pure hypercholesterolemia, unspecified: Secondary | ICD-10-CM

## 2012-08-04 DIAGNOSIS — D518 Other vitamin B12 deficiency anemias: Secondary | ICD-10-CM

## 2012-08-04 NOTE — Telephone Encounter (Signed)
Message copied by Judy Pimple on Sun Aug 04, 2012  5:15 PM ------      Message from: Baldomero Lamy      Created: Thu Jul 25, 2012  1:56 PM      Regarding: Cpx labsMon 08/05/12       Please order  future cpx labs for pt's upcomming lab appt.      Thanks      Rodney Booze

## 2012-08-05 ENCOUNTER — Other Ambulatory Visit (INDEPENDENT_AMBULATORY_CARE_PROVIDER_SITE_OTHER): Payer: Medicare Other

## 2012-08-05 DIAGNOSIS — M810 Age-related osteoporosis without current pathological fracture: Secondary | ICD-10-CM

## 2012-08-05 DIAGNOSIS — E559 Vitamin D deficiency, unspecified: Secondary | ICD-10-CM

## 2012-08-05 DIAGNOSIS — I10 Essential (primary) hypertension: Secondary | ICD-10-CM

## 2012-08-05 DIAGNOSIS — E119 Type 2 diabetes mellitus without complications: Secondary | ICD-10-CM

## 2012-08-05 DIAGNOSIS — D518 Other vitamin B12 deficiency anemias: Secondary | ICD-10-CM

## 2012-08-05 DIAGNOSIS — E7800 Pure hypercholesterolemia, unspecified: Secondary | ICD-10-CM

## 2012-08-05 LAB — LIPID PANEL
Total CHOL/HDL Ratio: 4
Triglycerides: 282 mg/dL — ABNORMAL HIGH (ref 0.0–149.0)

## 2012-08-05 LAB — COMPREHENSIVE METABOLIC PANEL
Albumin: 4.3 g/dL (ref 3.5–5.2)
Alkaline Phosphatase: 63 U/L (ref 39–117)
CO2: 27 mEq/L (ref 19–32)
GFR: 82.02 mL/min (ref >60.00)
Glucose, Bld: 166 mg/dL — ABNORMAL HIGH (ref 70–99)
Potassium: 4.2 mEq/L (ref 3.5–5.1)
Sodium: 139 mEq/L (ref 135–145)
Total Protein: 7.7 g/dL (ref 6.0–8.3)

## 2012-08-05 LAB — CBC WITH DIFFERENTIAL/PLATELET
Eosinophils Relative: 3.1 % (ref 0.0–5.0)
Lymphocytes Relative: 31 % (ref 12.0–46.0)
Monocytes Relative: 10.2 % (ref 3.0–12.0)
Neutrophils Relative %: 54.8 % (ref 43.0–77.0)
Platelets: 296 10*3/uL (ref 150.0–400.0)
WBC: 5.7 10*3/uL (ref 4.5–10.5)

## 2012-08-05 LAB — VITAMIN B12: Vitamin B-12: 691 pg/mL (ref 211–911)

## 2012-08-05 LAB — LDL CHOLESTEROL, DIRECT: Direct LDL: 92.3 mg/dL

## 2012-08-05 LAB — TSH: TSH: 3.22 u[IU]/mL (ref 0.35–5.50)

## 2012-08-05 LAB — HEMOGLOBIN A1C: Hgb A1c MFr Bld: 7.8 % — ABNORMAL HIGH (ref 4.6–6.5)

## 2012-08-12 ENCOUNTER — Encounter: Payer: Self-pay | Admitting: Family Medicine

## 2012-08-12 ENCOUNTER — Ambulatory Visit (INDEPENDENT_AMBULATORY_CARE_PROVIDER_SITE_OTHER): Payer: Medicare Other | Admitting: Family Medicine

## 2012-08-12 VITALS — BP 160/90 | HR 74 | Temp 98.3°F | Ht 61.75 in | Wt 113.5 lb

## 2012-08-12 DIAGNOSIS — E7800 Pure hypercholesterolemia, unspecified: Secondary | ICD-10-CM

## 2012-08-12 DIAGNOSIS — E119 Type 2 diabetes mellitus without complications: Secondary | ICD-10-CM

## 2012-08-12 DIAGNOSIS — E559 Vitamin D deficiency, unspecified: Secondary | ICD-10-CM

## 2012-08-12 DIAGNOSIS — M810 Age-related osteoporosis without current pathological fracture: Secondary | ICD-10-CM

## 2012-08-12 DIAGNOSIS — I10 Essential (primary) hypertension: Secondary | ICD-10-CM

## 2012-08-12 DIAGNOSIS — D518 Other vitamin B12 deficiency anemias: Secondary | ICD-10-CM

## 2012-08-12 MED ORDER — GLIPIZIDE ER 10 MG PO TB24: 10.0000 mg | ORAL_TABLET | Freq: Every day | ORAL | Status: DC

## 2012-08-12 NOTE — Progress Notes (Signed)
Subjective:    Patient ID: Bianca Mueller, female    DOB: 1926/06/16, 78 y.o.   MRN: [id number]  HPI Here for check up of chronic medical conditions and to review health mt list   Feels good  Nothing new going on    Wt is stable with bmi of 20  No longer loosing wt -- appetite is very good  Had colonosc 9/10- with 10 year recall (likely none due to age)-had hyperplastic polyps  mammo 10/13- was ok  Self exam -no lumps   dexa 6/11- was last one  She declines further dexas OP- on miacalcin  Ca and D  D level is 31 Falls-none whatsoever and she says her balance is good  fx- no fragility fractures   DM Lab Results  Component Value Date   HGBA1C 7.8* 08/05/2012   sugar was better on metformin - but it made her sick and loose weight  Is open to inc her glipizide to 10mg  No low sugars -and have never had any low sugars    bp is up today -on first check No cp or palpitations or headaches or edema  No side effects to medicines  BP Readings from Last 3 Encounters:  08/12/12 160/90  02/09/12 120/64  08/07/11 120/70     Hyperlipidemia -on simvastatin and diet Lab Results  Component Value Date   CHOL 177 08/05/2012   CHOL 162 02/02/2012   CHOL 125 05/12/2011   Lab Results  Component Value Date   HDL 39.40 08/05/2012   HDL 47.70 02/02/2012   HDL 40.98 05/12/2011   Lab Results  Component Value Date   LDLCALC 80 02/02/2012   LDLCALC 50 05/12/2011   LDLCALC 75 09/26/2010   Lab Results  Component Value Date   TRIG 282.0* 08/05/2012   TRIG 173.0* 02/02/2012   TRIG 140.0 05/12/2011   Lab Results  Component Value Date   CHOLHDL 4 08/05/2012   CHOLHDL 3 02/02/2012   CHOLHDL 3 05/12/2011   Lab Results  Component Value Date   LDLDIRECT 92.3 08/05/2012   LDLDIRECT 77.2 03/08/2010   overall pretty stable Does not eat fatty foods   Hx of B12 def Lab Results  Component Value Date   VITAMINB12 691 08/05/2012   good   Patient Active Problem List  Diagnosis  .  UNSPECIFIED VITAMIN D DEFICIENCY  . HYPERCHOLESTEROLEMIA  . HYPERTENSION  . DIVERTICULITIS, COLON  . OSTEOPOROSIS  . PEDAL EDEMA  . SHELLFISH ALLERGY  . COLONIC POLYPS, HX OF  . ANEMIA, VITAMIN B12 DEFICIENCY  . Abnormal mammogram  . DM2 (diabetes mellitus, type 2)  . Palpitations  . Adult failure to thrive  . Bronchitis, acute  . Other screening mammogram   Past Medical History  Diagnosis Date  . Diabetes mellitus     type II  . Osteoporosis   . Hyperlipidemia   . Hypertension   . Hypercalcemia   . CAD (coronary artery disease)   . Zoster     past  . Diverticulosis   . Hx of adenomatous colonic polyps 2010    last colonscopy 2010,Dr Mechele Collin - no need for further  colonoscopy given age   Past Surgical History  Procedure Date  . Abdominal hysterectomy     ovaries intact  . Shoulder surgery     right   History  Substance Use Topics  . Smoking status: Former Games developer  . Smokeless tobacco: Not on file  . Alcohol Use: No   Family History  Problem  Relation Age of Onset  . Heart disease Mother     CAD  . Heart disease Father     CAD  . Heart disease Brother     CAD   Allergies  Allergen Reactions  . Alendronate Sodium   . Isosorbide   . Metformin And Related     Decreases appetite resulting in weight loss  . Metronidazole     REACTION: sick/ GI  . Nifedipine    Current Outpatient Prescriptions on File Prior to Visit  Medication Sig Dispense Refill  . amLODipine (NORVASC) 5 MG tablet TAKE ONE TABLET BY MOUTH AT BEDTIME  30 tablet  11  . aspirin 325 MG tablet Take 325 mg by mouth daily.        . calcitonin, salmon, (MIACALCIN/FORTICAL) 200 UNIT/ACT nasal spray USE ONE SPRAY IN ONE NOSTRIL EVERY DAY *SWITCH NOSTRILS EVERY OTHER DAY*  4 mL  0  . Calcium Carbonate-Vit D-Min 600-400 MG-UNIT TABS Take 1 tablet by mouth daily.        . captopril-hydrochlorothiazide (CAPOZIDE) 25-15 MG per tablet TAKE ONE TABLET BY MOUTH THREE TIMES DAILY  90 tablet  6  .  cholecalciferol (VITAMIN D) 1000 UNITS tablet Take 1,000 Units by mouth daily.        . cloNIDine (CATAPRES) 0.2 MG tablet 1/2 tablet by mouth every AM, 1/2 tablet by mouth at lunch and 1 tablet by mouth every PM.       . glipiZIDE (GLUCOTROL XL) 5 MG 24 hr tablet Take 1 tablet (5 mg total) by mouth daily.  30 tablet  11  . glucose blood (ONE TOUCH ULTRA TEST) test strip Test blood sugar once or twice daily.   Dx: 250.00  100 each  12  . labetalol (NORMODYNE) 100 MG tablet TAKE ONE TABLET BY MOUTH EVERY DAY  30 tablet  11  . Lancets (ONETOUCH ULTRASOFT) lancets Use as instructed.  Check blood sugar once daily or as needed.  250.00  100 each  3  . NEXIUM 40 MG capsule TAKE ONE CAPSULE BY MOUTH EVERY DAY  30 capsule  11  . nitroGLYCERIN (NITROSTAT) 0.4 MG SL tablet Place 1 tablet (0.4 mg total) under the tongue every 5 (five) minutes as needed. For chest pain and if not improved seek medical attention in ER.  15 tablet  0  . simvastatin (ZOCOR) 20 MG tablet TAKE ONE TABLET BY MOUTH EVERY DAY  30 tablet  11  . vitamin B-12 (CYANOCOBALAMIN) 1000 MCG tablet Take 1,000 mcg by mouth daily.             Review of Systems Review of Systems  Constitutional: Negative for fever, appetite change, fatigue and unexpected weight change.  Eyes: Negative for pain and visual disturbance.  Respiratory: Negative for cough and shortness of breath.   Cardiovascular: Negative for cp or palpitations    Gastrointestinal: Negative for nausea, diarrhea and constipation.  Genitourinary: Negative for urgency and frequency.  Skin: Negative for pallor or rash   Neurological: Negative for weakness, light-headedness, numbness and headaches.  Hematological: Negative for adenopathy. Does not bruise/bleed easily.  Psychiatric/Behavioral: Negative for dysphoric mood. The patient is not nervous/anxious.         Objective:   Physical Exam  Constitutional: She appears well-developed and well-nourished. No distress.  HENT:    Head: Normocephalic and atraumatic.  Right Ear: External ear normal.  Left Ear: External ear normal.  Nose: Nose normal.  Mouth/Throat: Oropharynx is clear and moist.  Eyes:  Conjunctivae normal and EOM are normal. Pupils are equal, round, and reactive to light. Right eye exhibits no discharge. Left eye exhibits no discharge. No scleral icterus.  Neck: Normal range of motion. Neck supple. No JVD present. Carotid bruit is not present. No thyromegaly present.  Cardiovascular: Normal rate, regular rhythm, normal heart sounds and intact distal pulses.  Exam reveals no gallop.   Pulmonary/Chest: Effort normal and breath sounds normal. No respiratory distress. She has no wheezes.  Abdominal: Soft. Bowel sounds are normal. She exhibits no distension, no abdominal bruit and no mass. There is no tenderness.  Genitourinary: No breast swelling, tenderness, discharge or bleeding.       Breast exam: No mass, nodules, thickening, tenderness, bulging, retraction, inflamation, nipple discharge or skin changes noted.  No axillary or clavicular LA.  Chaperoned exam.    Musculoskeletal: She exhibits no edema and no tenderness.  Lymphadenopathy:    She has no cervical adenopathy.  Neurological: She is alert. She has normal reflexes. No cranial nerve deficit. She exhibits normal muscle tone. Coordination normal.  Skin: Skin is warm and dry. No rash noted. No erythema. No pallor.  Psychiatric: She has a normal mood and affect.          Assessment & Plan:

## 2012-08-12 NOTE — Assessment & Plan Note (Signed)
Better on 2nd check today bp in fair control at this time  No changes needed  Disc lifstyle change with low sodium diet and exercise   

## 2012-08-12 NOTE — Assessment & Plan Note (Signed)
Level is tx with current oral supplementation

## 2012-08-12 NOTE — Assessment & Plan Note (Signed)
Since pt cannot take metformin - sugars continue to rise Will inc glipizide cautiously to 10 mg XL daily- watching closely for hyperglycemia Diet is good Wt is stable

## 2012-08-12 NOTE — Assessment & Plan Note (Signed)
Pt is on miacalcin No falls or fragility fx D level is stable  She declines further dexa  Last one 6/11

## 2012-08-12 NOTE — Assessment & Plan Note (Signed)
This is stable on zocor and diet Disc goals for lipids and reasons to control them Rev labs with pt Rev low sat fat diet in detail

## 2012-08-12 NOTE — Assessment & Plan Note (Signed)
D level is in 30s on current supplementation Disc imp to bone and overall health

## 2012-08-12 NOTE — Patient Instructions (Signed)
Keep taking good care of youself Increase your glipizide 5 mg XL to 10 mg once daily  I sent new px to your pharmacy  Watch glucose readings carefully- if low (below 70)- let me know  Do not skip meals  Don't forget your calcium and vitamin D  No other medicine changes  Follow up in 3 months for diabetes - bring your sugar readings with you

## 2012-08-29 LAB — HM DIABETES EYE EXAM

## 2012-09-06 ENCOUNTER — Encounter: Payer: Self-pay | Admitting: Family Medicine

## 2012-09-17 ENCOUNTER — Other Ambulatory Visit: Payer: Self-pay | Admitting: Family Medicine

## 2012-10-09 ENCOUNTER — Other Ambulatory Visit: Payer: Self-pay | Admitting: Family Medicine

## 2012-10-10 ENCOUNTER — Other Ambulatory Visit: Payer: Self-pay | Admitting: Family Medicine

## 2012-11-12 ENCOUNTER — Encounter: Payer: Self-pay | Admitting: Family Medicine

## 2012-11-12 ENCOUNTER — Ambulatory Visit (INDEPENDENT_AMBULATORY_CARE_PROVIDER_SITE_OTHER): Payer: Medicare Other | Admitting: Family Medicine

## 2012-11-12 VITALS — BP 140/82 | HR 80 | Temp 98.9°F | Ht 61.75 in | Wt 114.5 lb

## 2012-11-12 DIAGNOSIS — I10 Essential (primary) hypertension: Secondary | ICD-10-CM

## 2012-11-12 DIAGNOSIS — E119 Type 2 diabetes mellitus without complications: Secondary | ICD-10-CM

## 2012-11-12 LAB — HEMOGLOBIN A1C: Hgb A1c MFr Bld: 8 % — ABNORMAL HIGH (ref 4.6–6.5)

## 2012-11-12 NOTE — Assessment & Plan Note (Signed)
Lab Results  Component Value Date   HGBA1C 7.8* 08/05/2012   went up on glipizide xl to 10  (intol of metformin) and no hypglycemia  Home sugars better  a1c today and advise- would like it closer to or below 7 Rev low glycemic diet

## 2012-11-12 NOTE — Progress Notes (Signed)
Subjective:    Patient ID: Bianca Mueller, female    DOB: 1925-11-23, 79 y.o.   MRN: [id number]  HPI Here for f/u chronic health problems  Diabetes Home sugar results - improved some with inc glipizide xl ams 130s avg, and before lunch 120s-150, after lunch 130s , before dinner sometimes higher- a few in 200s Wt is stable  DM diet - is very good about that  Exercise -not as much in the winter , tries to be active in the house  Symptoms-no hypoglycemia  A1C last  Lab Results  Component Value Date   HGBA1C 7.8* 08/05/2012  did inc her glipizide to 10 mg last time (intol of metformin)  No problems with medications  Renal protection- ace Last eye exam - is up to date   bp is stable today  No cp or palpitations or headaches or edema  No side effects to medicines  BP Readings from Last 3 Encounters:  11/12/12 140/82  08/12/12 160/90  02/09/12 120/64     Patient Active Problem List  Diagnosis  . UNSPECIFIED VITAMIN D DEFICIENCY  . HYPERCHOLESTEROLEMIA  . HYPERTENSION  . DIVERTICULITIS, COLON  . OSTEOPOROSIS  . PEDAL EDEMA  . SHELLFISH ALLERGY  . COLONIC POLYPS, HX OF  . ANEMIA, VITAMIN B12 DEFICIENCY  . DM2 (diabetes mellitus, type 2)  . Palpitations  . Adult failure to thrive  . Other screening mammogram   Past Medical History  Diagnosis Date  . Diabetes mellitus     type II  . Osteoporosis   . Hyperlipidemia   . Hypertension   . Hypercalcemia   . CAD (coronary artery disease)   . Zoster     past  . Diverticulosis   . Hx of adenomatous colonic polyps 2010    last colonscopy 2010,Dr Mechele Collin - no need for further  colonoscopy given age   Past Surgical History  Procedure Laterality Date  . Abdominal hysterectomy      ovaries intact  . Shoulder surgery      right   History  Substance Use Topics  . Smoking status: Former Games developer  . Smokeless tobacco: Not on file  . Alcohol Use: No   Family History  Problem Relation Age of Onset  . Heart disease  Mother     CAD  . Heart disease Father     CAD  . Heart disease Brother     CAD   Allergies  Allergen Reactions  . Alendronate Sodium   . Isosorbide   . Metformin And Related     Decreases appetite resulting in weight loss  . Metronidazole     REACTION: sick/ GI  . Nifedipine    Current Outpatient Prescriptions on File Prior to Visit  Medication Sig Dispense Refill  . amLODipine (NORVASC) 5 MG tablet TAKE ONE TABLET BY MOUTH AT BEDTIME  30 tablet  11  . aspirin 325 MG tablet Take 325 mg by mouth daily.        . calcitonin, salmon, (MIACALCIN/FORTICAL) 200 UNIT/ACT nasal spray USE ONE SPRAY IN ONE NOSTRIL EVERY DAY *SWITCH NOSTRILS EVERY OTHER DAY*  4 mL  0  . Calcium Carbonate-Vit D-Min 600-400 MG-UNIT TABS Take 1 tablet by mouth daily.        . captopril-hydrochlorothiazide (CAPOZIDE) 25-15 MG per tablet TAKE ONE TABLET BY MOUTH THREE TIMES DAILY  90 tablet  11  . cholecalciferol (VITAMIN D) 1000 UNITS tablet Take 1,000 Units by mouth daily.        Marland Kitchen  cloNIDine (CATAPRES) 0.2 MG tablet 1/2 tablet by mouth every AM, 1/2 tablet by mouth at lunch and 1 tablet by mouth every PM.       . glipiZIDE (GLUCOTROL XL) 10 MG 24 hr tablet Take 1 tablet (10 mg total) by mouth daily.  30 tablet  11  . glucose blood (ONE TOUCH ULTRA TEST) test strip Test blood sugar once or twice daily.   Dx: 250.00  100 each  12  . labetalol (NORMODYNE) 100 MG tablet TAKE ONE TABLET BY MOUTH EVERY DAY  30 tablet  11  . Lancets (ONETOUCH ULTRASOFT) lancets Use as instructed.  Check blood sugar once daily or as needed.  250.00  100 each  3  . NEXIUM 40 MG capsule TAKE ONE CAPSULE BY MOUTH EVERY DAY  30 capsule  11  . nitroGLYCERIN (NITROSTAT) 0.4 MG SL tablet Place 1 tablet (0.4 mg total) under the tongue every 5 (five) minutes as needed. For chest pain and if not improved seek medical attention in ER.  15 tablet  0  . simvastatin (ZOCOR) 20 MG tablet TAKE ONE TABLET BY MOUTH EVERY DAY  30 tablet  11  . vitamin B-12  (CYANOCOBALAMIN) 1000 MCG tablet Take 1,000 mcg by mouth daily.         No current facility-administered medications on file prior to visit.        Review of Systems Review of Systems  Constitutional: Negative for fever, appetite change, fatigue and unexpected weight change.  Eyes: Negative for pain and visual disturbance.  Respiratory: Negative for cough and shortness of breath.   Cardiovascular: Negative for cp or palpitations    Gastrointestinal: Negative for nausea, diarrhea and constipation.  Genitourinary: Negative for urgency and frequency.  Skin: Negative for pallor or rash   Neurological: Negative for weakness, light-headedness, numbness and headaches.  Hematological: Negative for adenopathy. Does not bruise/bleed easily.  Psychiatric/Behavioral: Negative for dysphoric mood. The patient is not nervous/anxious.         Objective:   Physical Exam  Constitutional: She appears well-developed and well-nourished. No distress.  HENT:  Head: Normocephalic and atraumatic.  Mouth/Throat: Oropharynx is clear and moist.  Eyes: Conjunctivae and EOM are normal. Pupils are equal, round, and reactive to light. No scleral icterus.  Neck: Normal range of motion. Neck supple. No JVD present. Carotid bruit is not present. No thyromegaly present.  Cardiovascular: Normal rate, normal heart sounds and intact distal pulses.  Exam reveals no gallop.   Pulmonary/Chest: Effort normal and breath sounds normal. No respiratory distress. She has no wheezes.  Abdominal: Soft. Bowel sounds are normal. She exhibits no distension, no abdominal bruit and no mass. There is no tenderness.  Musculoskeletal: She exhibits no edema.  Lymphadenopathy:    She has no cervical adenopathy.  Neurological: She is alert. She has normal reflexes. No cranial nerve deficit. She exhibits normal muscle tone. Coordination normal.  Skin: Skin is warm and dry. No rash noted.  Psychiatric: She has a normal mood and affect.           Assessment & Plan:

## 2012-11-12 NOTE — Assessment & Plan Note (Signed)
bp in fair control at this time  No changes needed  Disc lifstyle change with low sodium diet and exercise   

## 2012-11-12 NOTE — Patient Instructions (Addendum)
Checking a1c today- and I hope it is improved  It sounds like home sugars are improved  Blood pressure is good No change in medicines  Will update you with a result and plan

## 2012-11-14 ENCOUNTER — Telehealth: Payer: Self-pay | Admitting: Family Medicine

## 2012-11-14 MED ORDER — SITAGLIPTIN PHOSPHATE 100 MG PO TABS: 100.0000 mg | ORAL_TABLET | Freq: Every day | ORAL | Status: DC

## 2012-11-14 NOTE — Telephone Encounter (Signed)
If she can afford that - then please call/ send in the [place]  If not -please let me know  Update if any side effects and keep watching sugar levels  Follow up in 3 mo with labs prior for a1c

## 2012-11-14 NOTE — Telephone Encounter (Signed)
Message copied by Judy Pimple on Thu Nov 14, 2012  3:59 PM ------      Message from: Patience Musca      Created: Thu Nov 14, 2012  1:41 PM       Pt called back insurance will cover Januvia and Lantus at $ 30 copay for 30 day supply. ------

## 2012-11-15 ENCOUNTER — Other Ambulatory Visit: Payer: Self-pay | Admitting: Family Medicine

## 2012-11-15 MED ORDER — SITAGLIPTIN PHOSPHATE 100 MG PO TABS: 100.0000 mg | ORAL_TABLET | Freq: Every day | ORAL | Status: DC

## 2012-11-15 NOTE — Telephone Encounter (Signed)
Pt ok with taking [place], Rx sent to pharmacy advise to update [place] if has any side eff, pt is getting her daughter to scheduled f/u with labs prior in 3 months

## 2012-11-18 ENCOUNTER — Telehealth: Payer: Self-pay | Admitting: *Deleted

## 2012-11-18 MED ORDER — AZITHROMYCIN 250 MG PO TABS: ORAL_TABLET | ORAL | Status: DC

## 2012-11-18 NOTE — Telephone Encounter (Signed)
Pt notified of Dr. Royden Purl comments and Rx sent into pharmacy

## 2012-11-18 NOTE — Telephone Encounter (Signed)
1.   Patient states she mentioned the trouble she was having with coughing when she was in for her follow up visit last week.  It was felt that it was probably allergy symptoms.  She says she wasn't given anything for it and it now has progressed to green discharge from her nose and coughing up green phlegm.  The cough is bothersome at night as well as during the day.  Can she at least take some OTC Allegra or Claritin?  She is concerned about infection with the green phlegm.  2.  Does she take the Januvia along with the Glipizide or in place of Glipizide?

## 2012-11-18 NOTE — Telephone Encounter (Signed)
Take [place] along with the glipizide  Either claritin or allegra are fine mucinex is ok for congestion  Please call in zpak px and f/u if no improvement

## 2012-11-19 ENCOUNTER — Other Ambulatory Visit: Payer: Self-pay | Admitting: *Deleted

## 2012-11-19 MED ORDER — GLUCOSE BLOOD VI STRP: ORAL_STRIP | Status: DC

## 2013-01-11 ENCOUNTER — Other Ambulatory Visit: Payer: Self-pay | Admitting: Family Medicine

## 2013-02-03 ENCOUNTER — Telehealth: Payer: Self-pay | Admitting: Family Medicine

## 2013-02-03 DIAGNOSIS — I10 Essential (primary) hypertension: Secondary | ICD-10-CM

## 2013-02-03 DIAGNOSIS — E7800 Pure hypercholesterolemia, unspecified: Secondary | ICD-10-CM

## 2013-02-03 DIAGNOSIS — E119 Type 2 diabetes mellitus without complications: Secondary | ICD-10-CM

## 2013-02-03 NOTE — Telephone Encounter (Signed)
Message copied by Judy Pimple on Mon Feb 03, 2013  9:24 PM ------      Message from: Alvina Chou      Created: Fri Jan 24, 2013 11:04 AM      Regarding: Lab orders for Tuesday, 5.20.14       3 month F/U ------

## 2013-02-07 ENCOUNTER — Other Ambulatory Visit (INDEPENDENT_AMBULATORY_CARE_PROVIDER_SITE_OTHER): Payer: Medicare Other

## 2013-02-07 DIAGNOSIS — E7800 Pure hypercholesterolemia, unspecified: Secondary | ICD-10-CM

## 2013-02-07 DIAGNOSIS — E119 Type 2 diabetes mellitus without complications: Secondary | ICD-10-CM

## 2013-02-07 LAB — LIPID PANEL
LDL Cholesterol: 80 mg/dL (ref 0–99)
Total CHOL/HDL Ratio: 4

## 2013-02-11 ENCOUNTER — Telehealth: Payer: Self-pay | Admitting: Radiology

## 2013-02-11 NOTE — Telephone Encounter (Signed)
Not able to add CMP per [facility] Lab.

## 2013-02-12 ENCOUNTER — Ambulatory Visit: Payer: Medicare Other | Admitting: Family Medicine

## 2013-02-14 ENCOUNTER — Other Ambulatory Visit: Payer: Self-pay | Admitting: Family Medicine

## 2013-02-17 ENCOUNTER — Encounter: Payer: Self-pay | Admitting: Family Medicine

## 2013-02-17 ENCOUNTER — Ambulatory Visit (INDEPENDENT_AMBULATORY_CARE_PROVIDER_SITE_OTHER): Payer: Medicare Other | Admitting: Family Medicine

## 2013-02-17 VITALS — BP 132/68 | HR 74 | Temp 98.2°F | Ht 61.75 in | Wt 113.5 lb

## 2013-02-17 DIAGNOSIS — I10 Essential (primary) hypertension: Secondary | ICD-10-CM

## 2013-02-17 DIAGNOSIS — E7800 Pure hypercholesterolemia, unspecified: Secondary | ICD-10-CM

## 2013-02-17 DIAGNOSIS — E119 Type 2 diabetes mellitus without complications: Secondary | ICD-10-CM

## 2013-02-17 NOTE — Assessment & Plan Note (Signed)
Much imp with addn of januvia-tol well Lab Results  Component Value Date   HGBA1C 6.7* 02/07/2013   diet good  utd on imms/ opthy and foot exam F/u 6 mo

## 2013-02-17 NOTE — Assessment & Plan Note (Signed)
bp is stable today  No cp or palpitations or headaches or edema  No side effects to medicines  BP Readings from Last 3 Encounters:  02/17/13 132/68  11/12/12 140/82  08/12/12 160/90     No change of medicines

## 2013-02-17 NOTE — Patient Instructions (Addendum)
Keep up the good work  No change in medicines  Follow up in 6 months for annual exam with labs prior

## 2013-02-17 NOTE — Assessment & Plan Note (Signed)
Lipids stable Disc goals for lipids and reasons to control them Rev labs with pt Rev low sat fat diet in detail

## 2013-02-17 NOTE — Progress Notes (Signed)
Subjective:    Patient ID: Bianca Mueller, female    DOB: Dec 03, 1925, 79 y.o.   MRN: [id number]  HPI Here for f/u of chronic medical problems   Wt is down 1 lb with bmi of 20  Diabetes Home sugar results - they are good/ only one low sugar - thinks she had not eaten enough that day- is better now  DM diet -is eating well overall and wt is stable  Exercise - stays active  Symptoms A1C last  Lab Results  Component Value Date   HGBA1C 6.7* 02/07/2013  this is down from over 8!  No problems with medications -now on januvia and no side effects  Renal protection on ace Last eye exam was 12/13 ok   bp is stable today  No cp or palpitations or headaches or edema  No side effects to medicines  BP Readings from Last 3 Encounters:  02/17/13 132/68  11/12/12 140/82  08/12/12 160/90      Hyperlipidemia Lab Results  Component Value Date   CHOL 157 02/07/2013   HDL 42.70 02/07/2013   LDLCALC 80 02/07/2013   LDLDIRECT 92.3 08/05/2012   TRIG 173.0* 02/07/2013   CHOLHDL 4 02/07/2013   this is overall stable Pt watches diet most of the time- but does cheat occas with fatty foods No side eff from medicines   Patient Active Problem List   Diagnosis Date Noted  . Other screening mammogram 05/17/2012  . Adult failure to thrive 05/17/2011  . Palpitations 02/16/2011  . DM2 (diabetes mellitus, type 2) 02/14/2011  . ANEMIA, VITAMIN B12 DEFICIENCY 09/26/2010  . PEDAL EDEMA 03/16/2010  . SHELLFISH ALLERGY 09/06/2009  . UNSPECIFIED VITAMIN D DEFICIENCY 03/02/2009  . DIVERTICULITIS, COLON 11/12/2008  . COLONIC POLYPS, HX OF 09/02/2008  . HYPERCHOLESTEROLEMIA 12/10/2006  . HYPERTENSION 12/10/2006  . OSTEOPOROSIS 03/18/2002   Past Medical History  Diagnosis Date  . Diabetes mellitus     type II  . Osteoporosis   . Hyperlipidemia   . Hypertension   . Hypercalcemia   . CAD (coronary artery disease)   . Zoster     past  . Diverticulosis   . Hx of adenomatous colonic polyps 2010   last colonscopy 2010,Dr Mechele Collin - no need for further  colonoscopy given age   Past Surgical History  Procedure Laterality Date  . Abdominal hysterectomy      ovaries intact  . Shoulder surgery      right   History  Substance Use Topics  . Smoking status: Former Games developer  . Smokeless tobacco: Not on file  . Alcohol Use: No   Family History  Problem Relation Age of Onset  . Heart disease Mother     CAD  . Heart disease Father     CAD  . Heart disease Brother     CAD   Allergies  Allergen Reactions  . Alendronate Sodium   . Isosorbide   . Metformin And Related     Decreases appetite resulting in weight loss  . Metronidazole     REACTION: sick/ GI  . Nifedipine    Current Outpatient Prescriptions on File Prior to Visit  Medication Sig Dispense Refill  . amLODipine (NORVASC) 5 MG tablet TAKE ONE TABLET BY MOUTH AT BEDTIME  30 tablet  11  . aspirin 325 MG tablet Take 325 mg by mouth daily.        . calcitonin, salmon, (MIACALCIN/FORTICAL) 200 UNIT/ACT nasal spray USE ONE SPRAY IN ALTERNATING NOSTRILS EVERY DAY  14.8 mL  0  . Calcium Carbonate-Vit D-Min 600-400 MG-UNIT TABS Take 1 tablet by mouth daily.        . captopril-hydrochlorothiazide (CAPOZIDE) 25-15 MG per tablet TAKE ONE TABLET BY MOUTH THREE TIMES DAILY  90 tablet  11  . cholecalciferol (VITAMIN D) 1000 UNITS tablet Take 1,000 Units by mouth daily.        . cloNIDine (CATAPRES) 0.2 MG tablet 1/2 tablet by mouth every AM, 1/2 tablet by mouth at lunch and 1 tablet by mouth every PM.       . glipiZIDE (GLUCOTROL XL) 10 MG 24 hr tablet Take 1 tablet (10 mg total) by mouth daily.  30 tablet  11  . glucose blood (ONE TOUCH ULTRA TEST) test strip USE ONE STRIP TO CHECK BLOOD SUGAR ONCE DAILY AND PRN FOR DM 250.0  100 each  1  . labetalol (NORMODYNE) 100 MG tablet TAKE ONE TABLET BY MOUTH EVERY DAY  30 tablet  11  . Lancets (ONETOUCH ULTRASOFT) lancets Use as instructed.  Check blood sugar once daily or as needed.  250.00   100 each  3  . NEXIUM 40 MG capsule TAKE ONE CAPSULE BY MOUTH EVERY DAY  30 capsule  11  . nitroGLYCERIN (NITROSTAT) 0.4 MG SL tablet Place 1 tablet (0.4 mg total) under the tongue every 5 (five) minutes as needed. For chest pain and if not improved seek medical attention in ER.  15 tablet  0  . simvastatin (ZOCOR) 20 MG tablet TAKE ONE TABLET BY MOUTH EVERY DAY  30 tablet  11  . sitaGLIPtin (JANUVIA) 100 MG tablet Take 1 tablet (100 mg total) by mouth daily.  30 tablet  11  . vitamin B-12 (CYANOCOBALAMIN) 1000 MCG tablet Take 1,000 mcg by mouth daily.         No current facility-administered medications on file prior to visit.     Review of Systems Review of Systems  Constitutional: Negative for fever, appetite change, fatigue and unexpected weight change.  Eyes: Negative for pain and visual disturbance.  Respiratory: Negative for cough and shortness of breath.   Cardiovascular: Negative for cp or palpitations    Gastrointestinal: Negative for nausea, diarrhea and constipation.  Genitourinary: Negative for urgency and frequency.  Skin: Negative for pallor or rash   Neurological: Negative for weakness, light-headedness, numbness and headaches.  Hematological: Negative for adenopathy. Does not bruise/bleed easily.  Psychiatric/Behavioral: Negative for dysphoric mood. The patient is not nervous/anxious.         Objective:   Physical Exam  Constitutional: She appears well-developed and well-nourished. No distress.  Slim and well appearing   HENT:  Head: Normocephalic and atraumatic.  Right Ear: External ear normal.  Left Ear: External ear normal.  Nose: Nose normal.  Mouth/Throat: Oropharynx is clear and moist. No oropharyngeal exudate.  Eyes: Conjunctivae and EOM are normal. Pupils are equal, round, and reactive to light. Right eye exhibits no discharge. Left eye exhibits no discharge. No scleral icterus.  Neck: Normal range of motion. Neck supple. No JVD present. Carotid bruit is  not present. No thyromegaly present.  Cardiovascular: Normal rate, regular rhythm, normal heart sounds and intact distal pulses.  Exam reveals no gallop.   Pulmonary/Chest: Effort normal and breath sounds normal. No respiratory distress. She has no wheezes.  Abdominal: Soft. Bowel sounds are normal. She exhibits no distension and no abdominal bruit. There is no tenderness.  Musculoskeletal: She exhibits no edema.  Lymphadenopathy:    She has no  cervical adenopathy.  Neurological: She is alert. She has normal reflexes. No cranial nerve deficit. She exhibits normal muscle tone. Coordination normal.  Skin: Skin is warm and dry. No rash noted. No erythema. No pallor.  Psychiatric: She has a normal mood and affect.          Assessment & Plan:

## 2013-03-14 ENCOUNTER — Ambulatory Visit: Payer: Medicare Other | Admitting: Internal Medicine

## 2013-03-14 ENCOUNTER — Encounter: Payer: Self-pay | Admitting: Family Medicine

## 2013-03-14 ENCOUNTER — Ambulatory Visit (INDEPENDENT_AMBULATORY_CARE_PROVIDER_SITE_OTHER): Payer: Medicare Other | Admitting: Family Medicine

## 2013-03-14 ENCOUNTER — Ambulatory Visit: Payer: Medicare Other | Admitting: Family Medicine

## 2013-03-14 VITALS — BP 142/76 | HR 84 | Temp 98.0°F | Wt 111.0 lb

## 2013-03-14 DIAGNOSIS — L989 Disorder of the skin and subcutaneous tissue, unspecified: Secondary | ICD-10-CM

## 2013-03-14 MED ORDER — CEPHALEXIN 500 MG PO CAPS: 500.0000 mg | ORAL_CAPSULE | Freq: Three times a day (TID) | ORAL | Status: DC

## 2013-03-14 NOTE — Patient Instructions (Addendum)
Keep it covered with neosporin and use the antibiotics if it gets redder.  Take care.

## 2013-03-14 NOTE — Progress Notes (Signed)
Sore on L leg. Recent onset- macule had peeled off, became itchy, scratched it, skin broken with scratching.  Since then it got redder. Using neosporin and peroxide.  Glucose controlled. No fevers.    Meds, vitals, and allergies reviewed.   ROS: See HPI.  Otherwise, noncontributory.  nad ncat 1 cm area with thin scab adherent on L shin, not ttp, no fluctuance and no drainage.  Minimal surround pink tissue and this appears to be from healing and not redness seen with a typical skin infection

## 2013-03-16 DIAGNOSIS — S81819A Laceration without foreign body, unspecified lower leg, initial encounter: Secondary | ICD-10-CM | POA: Insufficient documentation

## 2013-03-16 NOTE — Assessment & Plan Note (Signed)
Should resolve with routine topical care.  If spreading erythema then start keflex.  She'll update [place] prn.  She agrees.  D/w pt's daughter, who also agrees.  Okay for outpatient fu.

## 2013-03-27 ENCOUNTER — Other Ambulatory Visit: Payer: Self-pay

## 2013-05-12 ENCOUNTER — Other Ambulatory Visit: Payer: Self-pay | Admitting: Family Medicine

## 2013-06-11 ENCOUNTER — Other Ambulatory Visit: Payer: Self-pay | Admitting: *Deleted

## 2013-06-11 MED ORDER — GLUCOSE BLOOD VI STRP: ORAL_STRIP | Status: DC

## 2013-06-18 ENCOUNTER — Ambulatory Visit (INDEPENDENT_AMBULATORY_CARE_PROVIDER_SITE_OTHER): Payer: Medicare Other

## 2013-06-18 DIAGNOSIS — Z23 Encounter for immunization: Secondary | ICD-10-CM

## 2013-06-19 DIAGNOSIS — Z23 Encounter for immunization: Secondary | ICD-10-CM

## 2013-06-25 ENCOUNTER — Ambulatory Visit: Payer: Self-pay | Admitting: Family Medicine

## 2013-06-25 ENCOUNTER — Encounter: Payer: Self-pay | Admitting: Family Medicine

## 2013-07-03 ENCOUNTER — Other Ambulatory Visit: Payer: Self-pay | Admitting: *Deleted

## 2013-07-03 MED ORDER — NITROGLYCERIN 0.4 MG SL SUBL: 0.4000 mg | SUBLINGUAL_TABLET | SUBLINGUAL | Status: DC | PRN

## 2013-07-03 NOTE — Telephone Encounter (Signed)
Patient's Nitrostat is out of date.  Wants refill to keep it up to date in case needed.  Rx printed and placed in your in box for signature, then fax to [place], [employer], [employer].

## 2013-07-04 NOTE — Telephone Encounter (Signed)
Rx faxed

## 2013-07-04 NOTE — Telephone Encounter (Signed)
Done and in IN box, thanks  

## 2013-07-11 ENCOUNTER — Ambulatory Visit: Payer: Self-pay | Admitting: Family Medicine

## 2013-07-14 ENCOUNTER — Encounter: Payer: Self-pay | Admitting: Family Medicine

## 2013-07-15 ENCOUNTER — Encounter: Payer: Self-pay | Admitting: *Deleted

## 2013-08-03 ENCOUNTER — Other Ambulatory Visit: Payer: Self-pay | Admitting: Family Medicine

## 2013-08-11 ENCOUNTER — Telehealth: Payer: Self-pay | Admitting: Family Medicine

## 2013-08-11 DIAGNOSIS — M810 Age-related osteoporosis without current pathological fracture: Secondary | ICD-10-CM

## 2013-08-11 DIAGNOSIS — I10 Essential (primary) hypertension: Secondary | ICD-10-CM

## 2013-08-11 DIAGNOSIS — D518 Other vitamin B12 deficiency anemias: Secondary | ICD-10-CM

## 2013-08-11 DIAGNOSIS — E119 Type 2 diabetes mellitus without complications: Secondary | ICD-10-CM

## 2013-08-11 DIAGNOSIS — E7800 Pure hypercholesterolemia, unspecified: Secondary | ICD-10-CM

## 2013-08-11 DIAGNOSIS — E559 Vitamin D deficiency, unspecified: Secondary | ICD-10-CM

## 2013-08-11 NOTE — Telephone Encounter (Signed)
Message copied by Judy Pimple on Mon Aug 11, 2013  9:38 PM ------      Message from: Alvina Chou      Created: Fri Aug 08, 2013  9:42 AM      Regarding: Lab orders for Tuesday, 11.25.14       Patient is scheduled for CPX labs, please order future labs, Thanks , Terri       ------

## 2013-08-12 ENCOUNTER — Other Ambulatory Visit: Payer: Medicare Other

## 2013-08-18 ENCOUNTER — Other Ambulatory Visit (INDEPENDENT_AMBULATORY_CARE_PROVIDER_SITE_OTHER): Payer: Medicare Other

## 2013-08-18 DIAGNOSIS — M810 Age-related osteoporosis without current pathological fracture: Secondary | ICD-10-CM

## 2013-08-18 DIAGNOSIS — E559 Vitamin D deficiency, unspecified: Secondary | ICD-10-CM

## 2013-08-18 DIAGNOSIS — D518 Other vitamin B12 deficiency anemias: Secondary | ICD-10-CM

## 2013-08-18 DIAGNOSIS — E7800 Pure hypercholesterolemia, unspecified: Secondary | ICD-10-CM

## 2013-08-18 DIAGNOSIS — I10 Essential (primary) hypertension: Secondary | ICD-10-CM

## 2013-08-18 DIAGNOSIS — E119 Type 2 diabetes mellitus without complications: Secondary | ICD-10-CM

## 2013-08-18 LAB — CBC WITH DIFFERENTIAL/PLATELET
Basophils Absolute: 0.1 10*3/uL (ref 0.0–0.1)
Eosinophils Absolute: 0.2 10*3/uL (ref 0.0–0.7)
Eosinophils Relative: 3.8 % (ref 0.0–5.0)
HCT: 40.8 % (ref 36.0–46.0)
Hemoglobin: 13.4 g/dL (ref 12.0–15.0)
Lymphs Abs: 1.8 10*3/uL (ref 0.7–4.0)
MCHC: 32.9 g/dL (ref 30.0–36.0)
Monocytes Relative: 10.5 % (ref 3.0–12.0)
Neutro Abs: 3.3 10*3/uL (ref 1.4–7.7)
Platelets: 258 10*3/uL (ref 150.0–400.0)
RDW: 15.2 % — ABNORMAL HIGH (ref 11.5–14.6)

## 2013-08-18 LAB — LIPID PANEL
Cholesterol: 178 mg/dL (ref 0–200)
Total CHOL/HDL Ratio: 5
Triglycerides: 301 mg/dL — ABNORMAL HIGH (ref 0.0–149.0)
VLDL: 60.2 mg/dL — ABNORMAL HIGH (ref 0.0–40.0)

## 2013-08-18 LAB — COMPREHENSIVE METABOLIC PANEL
AST: 16 U/L (ref 0–37)
Albumin: 4.1 g/dL (ref 3.5–5.2)
Alkaline Phosphatase: 49 U/L (ref 39–117)
BUN: 20 mg/dL (ref 6–23)
Potassium: 4.3 mEq/L (ref 3.5–5.1)
Sodium: 139 mEq/L (ref 135–145)
Total Protein: 7.7 g/dL (ref 6.0–8.3)

## 2013-08-18 LAB — VITAMIN B12: Vitamin B-12: 262 pg/mL (ref 211–911)

## 2013-08-19 ENCOUNTER — Ambulatory Visit (INDEPENDENT_AMBULATORY_CARE_PROVIDER_SITE_OTHER): Payer: Medicare Other | Admitting: Family Medicine

## 2013-08-19 ENCOUNTER — Encounter: Payer: Self-pay | Admitting: Family Medicine

## 2013-08-19 VITALS — BP 130/68 | HR 67 | Temp 97.6°F | Ht 61.25 in | Wt 113.0 lb

## 2013-08-19 DIAGNOSIS — E7800 Pure hypercholesterolemia, unspecified: Secondary | ICD-10-CM

## 2013-08-19 DIAGNOSIS — D518 Other vitamin B12 deficiency anemias: Secondary | ICD-10-CM

## 2013-08-19 DIAGNOSIS — M810 Age-related osteoporosis without current pathological fracture: Secondary | ICD-10-CM

## 2013-08-19 DIAGNOSIS — E559 Vitamin D deficiency, unspecified: Secondary | ICD-10-CM

## 2013-08-19 DIAGNOSIS — E119 Type 2 diabetes mellitus without complications: Secondary | ICD-10-CM

## 2013-08-19 DIAGNOSIS — Z0000 Encounter for general adult medical examination without abnormal findings: Secondary | ICD-10-CM | POA: Insufficient documentation

## 2013-08-19 DIAGNOSIS — I10 Essential (primary) hypertension: Secondary | ICD-10-CM

## 2013-08-19 LAB — VITAMIN D 25 HYDROXY (VIT D DEFICIENCY, FRACTURES): Vit D, 25-Hydroxy: 31 ng/mL (ref 30–89)

## 2013-08-19 MED ORDER — CYANOCOBALAMIN 1000 MCG/ML IJ SOLN
1000.0000 ug | Freq: Once | INTRAMUSCULAR | Status: AC
  Administered 2013-08-19: 1000 ug via INTRAMUSCULAR

## 2013-08-19 NOTE — Patient Instructions (Signed)
B12 level is on the low side  Keep taking your daily B12  B12 shot today Get another one in 3 months Follow up with me in 6 months with labs prior Add another 1000 iu of vitamin D daily  Try fish oil 1000 mg once daily if tolerated to raise good cholesterol

## 2013-08-19 NOTE — Progress Notes (Signed)
Pre-visit discussion using our clinic review tool. No additional management support is needed unless otherwise documented below in the visit note.  

## 2013-08-19 NOTE — Assessment & Plan Note (Signed)
dexa 6/11 Declines further dexa Disc need for calcium/ vitamin D/ wt bearing exercise and bone density test every 2 y to monitor Disc safety/ fracture risk in detail   Will add another 1000 iu D daily No falls or fx

## 2013-08-19 NOTE — Assessment & Plan Note (Signed)
BP: 130/68 mmHg  bp is stable today  No cp or palpitations or headaches or edema  No side effects to medicines  BP Readings from Last 3 Encounters:  08/19/13 130/68  03/14/13 142/76  02/17/13 132/68   bp in fair control at this time  No changes needed Disc lifstyle change with low sodium diet and exercise

## 2013-08-19 NOTE — Assessment & Plan Note (Signed)
Reviewed health habits including diet and exercise and skin cancer prevention Reviewed appropriate screening tests for age  Also reviewed health mt list, fam hx and immunization status , as well as social and family history   See HPI Wellness labs rev  

## 2013-08-19 NOTE — Assessment & Plan Note (Signed)
Lab Results  Component Value Date   VITAMINB12 262 08/18/2013   inst to continue daily B12 oral  B12 inj today Another in 3 mo F/u 6 mo and check level then

## 2013-08-19 NOTE — Progress Notes (Signed)
Subjective:    Patient ID: Bianca Mueller, female    DOB: [birth year], 79 y.o.   MRN: [id number]  HPI I have personally reviewed the Medicare Annual Wellness questionnaire and have noted 1. The patient's medical and social history 2. Their use of alcohol, tobacco or illicit drugs 3. Their current medications and supplements 4. The patient's functional ability including ADL's, fall risks, home safety risks and hearing or visual             impairment. 5. Diet and physical activities 6. Evidence for depression or mood disorders  The patients weight, height, BMI have been recorded in the chart and visual acuity is per eye clinic.  I have made referrals, counseling and provided education to the patient based review of the above and I have provided the pt with a written personalized care plan for preventive services.  Feels good  Wt is up 2 lb - doing very well     See scanned forms.  Routine anticipatory guidance given to patient.  See health maintenance. Flu 10/14 Shingles 7/11 vaccine  PNA  12/06 vaccine  Tetanus 12/06  Colonoscopy 9/10 -- told she did not need anymore screening at her age  Breast cancer screening- mammo was 10/14 was normal Self exam -no lumps or changes  Advance directive- has a living will set up  Cognitive function addressed- see scanned forms- and if abnormal then additional documentation follows.  No problems at all with memory   PMH and SH reviewed  Meds, vitals, and allergies reviewed.   ROS: See HPI.  Otherwise negative.    Declines further dexa - [facility]  No fractures  D level is 31 - takes ca plus D and then 1000 iu D extra   B12 level fallen 262 -she takes orally    DM Lab Results  Component Value Date   HGBA1C 6.6* 08/18/2013   she does watch diet and stays very active utd on her eye exams     Chemistry      Component Value Date/Time   NA 139 08/18/2013 0823   K 4.3 08/18/2013 0823   CL 103 08/18/2013 0823   CO2 28 08/18/2013 0823   BUN 20 08/18/2013 0823   CREATININE 0.7 08/18/2013 0823      Component Value Date/Time   CALCIUM 9.7 08/18/2013 0823   ALKPHOS 49 08/18/2013 0823   AST 16 08/18/2013 0823   ALT 18 08/18/2013 0823   BILITOT 0.4 08/18/2013 0823     glucose 131 Lab Results  Component Value Date   WBC 6.0 08/18/2013   HGB 13.4 08/18/2013   HCT 40.8 08/18/2013   MCV 85.7 08/18/2013   PLT 258.0 08/18/2013    Lab Results  Component Value Date   TSH 3.76 08/18/2013     Hyperlipidemia  Simvastatin and diet Lab Results  Component Value Date   CHOL 178 08/18/2013   CHOL 157 02/07/2013   CHOL 177 08/05/2012   Lab Results  Component Value Date   HDL 37.90* 08/18/2013   HDL 42.70 02/07/2013   HDL 40.98 08/05/2012   Lab Results  Component Value Date   LDLCALC 80 02/07/2013   LDLCALC 80 02/02/2012   LDLCALC 50 05/12/2011   Lab Results  Component Value Date   TRIG 301.0* 08/18/2013   TRIG 173.0* 02/07/2013   TRIG 282.0* 08/05/2012   Lab Results  Component Value Date   CHOLHDL 5 08/18/2013   CHOLHDL 4 02/07/2013   CHOLHDL 4 08/05/2012  Lab Results  Component Value Date   LDLDIRECT 95.1 08/18/2013   LDLDIRECT 92.3 08/05/2012   LDLDIRECT 77.2 03/08/2010   HDL went down -overall stable otherwise  Trig tend to fluctuate   bp is stable today  No cp or palpitations or headaches or edema  No side effects to medicines  BP Readings from Last 3 Encounters:  08/19/13 130/68  03/14/13 142/76  02/17/13 132/68     Patient Active Problem List   Diagnosis Date Noted  . Encounter for Medicare annual wellness exam 08/19/2013  . Skin lesion 03/16/2013  . Other screening mammogram 05/17/2012  . Adult failure to thrive 05/17/2011  . Palpitations 02/16/2011  . DM2 (diabetes mellitus, type 2) 02/14/2011  . ANEMIA, VITAMIN B12 DEFICIENCY 09/26/2010  . PEDAL EDEMA 03/16/2010  . SHELLFISH ALLERGY 09/06/2009  . UNSPECIFIED VITAMIN D DEFICIENCY 03/02/2009  . DIVERTICULITIS, COLON 11/12/2008  . COLONIC POLYPS, HX OF  09/02/2008  . HYPERCHOLESTEROLEMIA 12/10/2006  . HYPERTENSION 12/10/2006  . OSTEOPOROSIS 03/18/2002   Past Medical History  Diagnosis Date  . Diabetes mellitus     type II  . Osteoporosis   . Hyperlipidemia   . Hypertension   . Hypercalcemia   . CAD (coronary artery disease)   . Zoster     past  . Diverticulosis   . Hx of adenomatous colonic polyps 2010    last colonscopy 2010,Dr Mechele Collin - no need for further  colonoscopy given age   Past Surgical History  Procedure Laterality Date  . Abdominal hysterectomy      ovaries intact  . Shoulder surgery      right   History  Substance Use Topics  . Smoking status: Former Games developer  . Smokeless tobacco: Not on file  . Alcohol Use: No   Family History  Problem Relation Age of Onset  . Heart disease Mother     CAD  . Heart disease Father     CAD  . Heart disease Brother     CAD   Allergies  Allergen Reactions  . Alendronate Sodium   . Isosorbide   . Metformin And Related     Decreases appetite resulting in weight loss  . Metronidazole     REACTION: sick/ GI  . Nifedipine    Current Outpatient Prescriptions on File Prior to Visit  Medication Sig Dispense Refill  . amLODipine (NORVASC) 5 MG tablet TAKE ONE TABLET BY MOUTH AT BEDTIME  30 tablet  11  . aspirin 325 MG tablet Take 325 mg by mouth daily.        . calcitonin, salmon, (MIACALCIN/FORTICAL) 200 UNIT/ACT nasal spray USE ONE SPRAY ONCE DAILY IN ONE NOSTRIL, ALTERNATING NOSTRILS EACH DAY  14.8 mL  0  . Calcium Carbonate-Vit D-Min 600-400 MG-UNIT TABS Take 1 tablet by mouth daily.        . captopril-hydrochlorothiazide (CAPOZIDE) 25-15 MG per tablet TAKE ONE TABLET BY MOUTH THREE TIMES DAILY  90 tablet  11  . cholecalciferol (VITAMIN D) 1000 UNITS tablet Take 1,000 Units by mouth daily.        . cloNIDine (CATAPRES) 0.2 MG tablet 1/2 tablet by mouth every AM, 1/2 tablet by mouth at lunch and 1 tablet by mouth every PM.       . glipiZIDE (GLUCOTROL XL) 10 MG 24 hr  tablet TAKE ONE TABLET BY MOUTH EVERY DAY  30 tablet  5  . glucose blood (ONE TOUCH ULTRA TEST) test strip USE ONE STRIP TO CHECK BLOOD SUGAR ONCE DAILY  AND PRN FOR DM 250.0  100 each  3  . labetalol (NORMODYNE) 100 MG tablet TAKE ONE TABLET BY MOUTH EVERY DAY  30 tablet  11  . Lancets (ONETOUCH ULTRASOFT) lancets Use as instructed.  Check blood sugar once daily or as needed.  250.00  100 each  3  . NEXIUM 40 MG capsule TAKE ONE CAPSULE BY MOUTH EVERY DAY  30 capsule  11  . nitroGLYCERIN (NITROSTAT) 0.4 MG SL tablet Place 1 tablet (0.4 mg total) under the tongue every 5 (five) minutes as needed. For chest pain and if not improved seek medical attention in ER.  15 tablet  0  . simvastatin (ZOCOR) 20 MG tablet TAKE ONE TABLET BY MOUTH ONCE DAILY  30 tablet  5  . sitaGLIPtin (JANUVIA) 100 MG tablet Take 1 tablet (100 mg total) by mouth daily.  30 tablet  11  . vitamin B-12 (CYANOCOBALAMIN) 1000 MCG tablet Take 1,000 mcg by mouth daily.         No current facility-administered medications on file prior to visit.     Review of Systems Review of Systems  Constitutional: Negative for fever, appetite change, fatigue and unexpected weight change.  Eyes: Negative for pain and visual disturbance.  Respiratory: Negative for cough and shortness of breath.   Cardiovascular: Negative for cp or palpitations    Gastrointestinal: Negative for nausea, diarrhea and constipation.  Genitourinary: Negative for urgency and frequency.  Skin: Negative for pallor or rash   Neurological: Negative for weakness, light-headedness, numbness and headaches.  Hematological: Negative for adenopathy. Does not bruise/bleed easily.  Psychiatric/Behavioral: Negative for dysphoric mood. The patient is not nervous/anxious.         Objective:   Physical Exam  Constitutional: She appears well-developed and well-nourished. No distress.  HENT:  Head: Normocephalic and atraumatic.  Right Ear: External ear normal.  Left Ear:  External ear normal.  Mouth/Throat: Oropharynx is clear and moist.  Eyes: Conjunctivae and EOM are normal. Pupils are equal, round, and reactive to light. No scleral icterus.  Neck: Normal range of motion. Neck supple. No JVD present. Carotid bruit is not present. No thyromegaly present.  Cardiovascular: Normal rate, regular rhythm, normal heart sounds and intact distal pulses.  Exam reveals no gallop.   Pulmonary/Chest: Effort normal and breath sounds normal. No respiratory distress. She has no wheezes. She exhibits no tenderness.  Abdominal: Soft. Bowel sounds are normal. She exhibits no distension, no abdominal bruit and no mass. There is no tenderness.  Genitourinary: No breast swelling, tenderness, discharge or bleeding.  Breast exam: No mass, nodules, thickening, tenderness, bulging, retraction, inflamation, nipple discharge or skin changes noted.  No axillary or clavicular LA.  Chaperoned exam.    Musculoskeletal: Normal range of motion. She exhibits no edema and no tenderness.  Lymphadenopathy:    She has no cervical adenopathy.  Neurological: She is alert. She has normal reflexes. No cranial nerve deficit. She exhibits normal muscle tone. Coordination normal.  Skin: Skin is warm and dry. No rash noted. No erythema. No pallor.  Solar aging noted  Psychiatric: She has a normal mood and affect.  Mentally sharp          Assessment & Plan:

## 2013-08-19 NOTE — Assessment & Plan Note (Signed)
Lab Results  Component Value Date   HGBA1C 6.6* 08/18/2013   In good control Disc low glycemic diet -doing great  Is very active Weight is stable  F/u 6 mo

## 2013-08-19 NOTE — Assessment & Plan Note (Signed)
Pt will add more D  Disc imp for bone and general health  Take ca plus D bid  addnl 2000 iu in addn to that Continue to monitor

## 2013-08-19 NOTE — Assessment & Plan Note (Signed)
Disc goals for lipids and reasons to control them Rev labs with pt Rev low sat fat diet in detail  HDL is lower  Disc activity and fish oil (will start 1000 mg daily) Re check in 6 mo with visit

## 2013-10-02 ENCOUNTER — Other Ambulatory Visit: Payer: Self-pay | Admitting: *Deleted

## 2013-10-02 MED ORDER — CAPTOPRIL-HYDROCHLOROTHIAZIDE 25-15 MG PO TABS: ORAL_TABLET | ORAL | Status: DC

## 2013-10-02 MED ORDER — LABETALOL HCL 100 MG PO TABS: ORAL_TABLET | ORAL | Status: DC

## 2013-10-02 MED ORDER — GLIPIZIDE ER 10 MG PO TB24: ORAL_TABLET | ORAL | Status: DC

## 2013-10-02 MED ORDER — SIMVASTATIN 20 MG PO TABS: ORAL_TABLET | ORAL | Status: DC

## 2013-10-02 MED ORDER — ONETOUCH ULTRASOFT LANCETS MISC: Status: DC

## 2013-10-02 MED ORDER — SITAGLIPTIN PHOSPHATE 100 MG PO TABS: 100.0000 mg | ORAL_TABLET | Freq: Every day | ORAL | Status: DC

## 2013-10-02 MED ORDER — CALCITONIN (SALMON) 200 UNIT/ACT NA SOLN: NASAL | Status: DC

## 2013-10-02 MED ORDER — AMLODIPINE BESYLATE 5 MG PO TABS: ORAL_TABLET | ORAL | Status: DC

## 2013-10-02 MED ORDER — GLUCOSE BLOOD VI STRP: ORAL_STRIP | Status: DC

## 2013-10-02 MED ORDER — CLONIDINE HCL 0.2 MG PO TABS: ORAL_TABLET | ORAL | Status: DC

## 2013-10-23 ENCOUNTER — Other Ambulatory Visit: Payer: Self-pay | Admitting: *Deleted

## 2013-10-23 MED ORDER — SITAGLIPTIN PHOSPHATE 100 MG PO TABS: 100.0000 mg | ORAL_TABLET | Freq: Every day | ORAL | Status: DC

## 2013-11-27 ENCOUNTER — Ambulatory Visit (INDEPENDENT_AMBULATORY_CARE_PROVIDER_SITE_OTHER): Payer: Medicare HMO | Admitting: *Deleted

## 2013-11-27 DIAGNOSIS — E538 Deficiency of other specified B group vitamins: Secondary | ICD-10-CM

## 2013-11-27 MED ORDER — CYANOCOBALAMIN 1000 MCG/ML IJ SOLN
1000.0000 ug | Freq: Once | INTRAMUSCULAR | Status: AC
Start: 2013-11-27 — End: 2013-11-27
  Administered 2013-11-27: 1000 ug via INTRAMUSCULAR

## 2014-02-10 ENCOUNTER — Other Ambulatory Visit (INDEPENDENT_AMBULATORY_CARE_PROVIDER_SITE_OTHER): Payer: Medicare HMO

## 2014-02-10 DIAGNOSIS — D518 Other vitamin B12 deficiency anemias: Secondary | ICD-10-CM

## 2014-02-10 DIAGNOSIS — E119 Type 2 diabetes mellitus without complications: Secondary | ICD-10-CM

## 2014-02-10 DIAGNOSIS — I10 Essential (primary) hypertension: Secondary | ICD-10-CM

## 2014-02-10 DIAGNOSIS — E7800 Pure hypercholesterolemia, unspecified: Secondary | ICD-10-CM

## 2014-02-10 LAB — COMPREHENSIVE METABOLIC PANEL
ALK PHOS: 46 U/L (ref 39–117)
ALT: 18 U/L (ref 0–35)
AST: 21 U/L (ref 0–37)
Albumin: 4 g/dL (ref 3.5–5.2)
BUN: 21 mg/dL (ref 6–23)
CO2: 26 mEq/L (ref 19–32)
CREATININE: 0.8 mg/dL (ref 0.4–1.2)
Calcium: 9.4 mg/dL (ref 8.4–10.5)
Chloride: 105 mEq/L (ref 96–112)
GFR: 76.78 mL/min (ref >60.00)
GLUCOSE: 116 mg/dL — AB (ref 70–99)
Potassium: 3.8 mEq/L (ref 3.5–5.1)
Sodium: 140 mEq/L (ref 135–145)
Total Bilirubin: 0.7 mg/dL (ref 0.2–1.2)
Total Protein: 7.2 g/dL (ref 6.0–8.3)

## 2014-02-10 LAB — LIPID PANEL
Cholesterol: 166 mg/dL (ref 0–200)
HDL: 38 mg/dL — ABNORMAL LOW (ref >39.00)
LDL Cholesterol: 79 mg/dL (ref 0–99)
Total CHOL/HDL Ratio: 4
Triglycerides: 243 mg/dL — ABNORMAL HIGH (ref 0.0–149.0)
VLDL: 48.6 mg/dL — ABNORMAL HIGH (ref 0.0–40.0)

## 2014-02-10 LAB — VITAMIN B12: VITAMIN B 12: 628 pg/mL (ref 211–911)

## 2014-02-10 LAB — HEMOGLOBIN A1C: HEMOGLOBIN A1C: 6.7 % — AB (ref 4.6–6.5)

## 2014-02-17 ENCOUNTER — Ambulatory Visit (INDEPENDENT_AMBULATORY_CARE_PROVIDER_SITE_OTHER): Payer: Commercial Managed Care - HMO | Admitting: Family Medicine

## 2014-02-17 ENCOUNTER — Encounter: Payer: Self-pay | Admitting: Family Medicine

## 2014-02-17 ENCOUNTER — Encounter (INDEPENDENT_AMBULATORY_CARE_PROVIDER_SITE_OTHER): Payer: Self-pay

## 2014-02-17 VITALS — BP 124/78 | HR 68 | Temp 97.8°F | Wt 113.0 lb

## 2014-02-17 DIAGNOSIS — R252 Cramp and spasm: Secondary | ICD-10-CM

## 2014-02-17 DIAGNOSIS — I10 Essential (primary) hypertension: Secondary | ICD-10-CM

## 2014-02-17 DIAGNOSIS — D518 Other vitamin B12 deficiency anemias: Secondary | ICD-10-CM

## 2014-02-17 DIAGNOSIS — E119 Type 2 diabetes mellitus without complications: Secondary | ICD-10-CM

## 2014-02-17 NOTE — Progress Notes (Signed)
Pre visit review using our clinic review tool, if applicable. No additional management support is needed unless otherwise documented below in the visit note. 

## 2014-02-17 NOTE — Progress Notes (Signed)
Subjective:    Patient ID: Bianca Mueller, female    DOB: 03-04-1926, 80 y.o.   MRN: [id number]  HPI Here for f/u of chronic medical problems and also for cramping   Having more leg and foot cramps-mostly at night  Used to be once in a while -not more frequent  Is more active and on her feet more  Draws up her toes-very painful - last 5 min or less - with soreness afterwards   Wt is stable  bmi of 21  bp is stable today  No cp or palpitations or headaches or edema  No side effects to medicines  BP Readings from Last 3 Encounters:  02/17/14 124/78  08/19/13 130/68  03/14/13 142/76       Chemistry      Component Value Date/Time   NA 140 02/10/2014 0846   K 3.8 02/10/2014 0846   CL 105 02/10/2014 0846   CO2 26 02/10/2014 0846   BUN 21 02/10/2014 0846   CREATININE 0.8 02/10/2014 0846      Component Value Date/Time   CALCIUM 9.4 02/10/2014 0846   ALKPHOS 46 02/10/2014 0846   AST 21 02/10/2014 0846   ALT 18 02/10/2014 0846   BILITOT 0.7 02/10/2014 0846      Hyperlipidemia  Lab Results  Component Value Date   CHOL 166 02/10/2014   HDL 38.00* 02/10/2014   LDLCALC 79 02/10/2014   LDLDIRECT 95.1 08/18/2013   TRIG 243.0* 02/10/2014   CHOLHDL 4 02/10/2014   zocor and diet  Overall her eating is very good   Vit B12 def  Supplemented Lab Results  Component Value Date   VITAMINB12 628 02/10/2014   Diabetes Home sugar results - no higher than 149 and no low sugar  DM diet -very good  Exercise -very good  Symptoms A1C last  Lab Results  Component Value Date   HGBA1C 6.7* 02/10/2014  was 6.6  Overall stable   No problems with medications  Renal protection ace  Last eye exam due in July (has it scheduled)    Patient Active Problem List   Diagnosis Date Noted  . Encounter for Medicare annual wellness exam 08/19/2013  . Skin lesion 03/16/2013  . Other screening mammogram 05/17/2012  . Adult failure to thrive 05/17/2011  . Palpitations 02/16/2011  . DM2 (diabetes  mellitus, type 2) 02/14/2011  . ANEMIA, VITAMIN B12 DEFICIENCY 09/26/2010  . PEDAL EDEMA 03/16/2010  . Power ALLERGY 09/06/2009  . UNSPECIFIED VITAMIN D DEFICIENCY 03/02/2009  . DIVERTICULITIS, COLON 11/12/2008  . COLONIC POLYPS, HX OF 09/02/2008  . HYPERCHOLESTEROLEMIA 12/10/2006  . HYPERTENSION 12/10/2006  . OSTEOPOROSIS 03/18/2002   Past Medical History  Diagnosis Date  . Diabetes mellitus     type II  . Osteoporosis   . Hyperlipidemia   . Hypertension   . Hypercalcemia   . CAD (coronary artery disease)   . Zoster     past  . Diverticulosis   . Hx of adenomatous colonic polyps 2010    last colonscopy 2010,Dr Vira Agar - no need for further  colonoscopy given age   Past Surgical History  Procedure Laterality Date  . Abdominal hysterectomy      ovaries intact  . Shoulder surgery      right   History  Substance Use Topics  . Smoking status: Former Research scientist (life sciences)  . Smokeless tobacco: Not on file  . Alcohol Use: No   Family History  Problem Relation Age of Onset  . Heart disease  Mother     CAD  . Heart disease Father     CAD  . Heart disease Brother     CAD   Allergies  Allergen Reactions  . Alendronate Sodium   . Isosorbide   . Metformin And Related     Decreases appetite resulting in weight loss  . Metronidazole     REACTION: sick/ GI  . Nifedipine    Current Outpatient Prescriptions on File Prior to Visit  Medication Sig Dispense Refill  . amLODipine (NORVASC) 5 MG tablet TAKE ONE TABLET BY MOUTH AT BEDTIME  90 tablet  3  . aspirin 325 MG tablet Take 325 mg by mouth daily.        . calcitonin, salmon, (MIACALCIN/FORTICAL) 200 UNIT/ACT nasal spray USE ONE SPRAY ONCE DAILY IN ONE NOSTRIL, ALTERNATING NOSTRILS EACH DAY  14.8 mL  3  . Calcium Carbonate-Vit D-Min 600-400 MG-UNIT TABS Take 1 tablet by mouth daily.        . captopril-hydrochlorothiazide (CAPOZIDE) 25-15 MG per tablet TAKE ONE TABLET BY MOUTH THREE TIMES DAILY  270 tablet  3  . cholecalciferol  (VITAMIN D) 1000 UNITS tablet Take 1,000 Units by mouth daily.        . cloNIDine (CATAPRES) 0.2 MG tablet 1/2 tablet by mouth every AM, 1/2 tablet by mouth at lunch and 1 tablet by mouth every PM.  180 tablet  3  . glipiZIDE (GLUCOTROL XL) 10 MG 24 hr tablet TAKE ONE TABLET BY MOUTH EVERY DAY  90 tablet  3  . glucose blood (ONE TOUCH ULTRA TEST) test strip USE ONE STRIP TO CHECK BLOOD SUGAR ONCE DAILY AND PRN FOR DM 250.0  100 each  3  . labetalol (NORMODYNE) 100 MG tablet TAKE ONE TABLET BY MOUTH EVERY DAY  90 tablet  3  . Lancets (ONETOUCH ULTRASOFT) lancets Use as instructed.  Check blood sugar once daily or as needed.  250.00  100 each  3  . NEXIUM 40 MG capsule TAKE ONE CAPSULE BY MOUTH EVERY DAY  30 capsule  11  . nitroGLYCERIN (NITROSTAT) 0.4 MG SL tablet Place 1 tablet (0.4 mg total) under the tongue every 5 (five) minutes as needed. For chest pain and if not improved seek medical attention in ER.  15 tablet  0  . simvastatin (ZOCOR) 20 MG tablet TAKE ONE TABLET BY MOUTH ONCE DAILY  90 tablet  3  . sitaGLIPtin (JANUVIA) 100 MG tablet Take 1 tablet (100 mg total) by mouth daily.  90 tablet  3  . vitamin B-12 (CYANOCOBALAMIN) 1000 MCG tablet Take 1,000 mcg by mouth daily.         No current facility-administered medications on file prior to visit.     Review of Systems Review of Systems  Constitutional: Negative for fever, appetite change, fatigue and unexpected weight change.  Eyes: Negative for pain and visual disturbance.  Respiratory: Negative for cough and shortness of breath.   Cardiovascular: Negative for cp or palpitations    Gastrointestinal: Negative for nausea, diarrhea and constipation.  Genitourinary: Negative for urgency and frequency. MSK pos for night time foot and calf cramps   Skin: Negative for pallor or rash   Neurological: Negative for weakness, light-headedness, numbness and headaches.  Hematological: Negative for adenopathy. Does not bruise/bleed easily.    Psychiatric/Behavioral: Negative for dysphoric mood. The patient is not nervous/anxious.         Objective:   Physical Exam  Constitutional: She appears well-developed and well-nourished. No distress.  Slim and well appearing   HENT:  Head: Normocephalic and atraumatic.  Mouth/Throat: Oropharynx is clear and moist.  Eyes: Conjunctivae and EOM are normal. Pupils are equal, round, and reactive to light. No scleral icterus.  Neck: Normal range of motion. Neck supple. No JVD present. No thyromegaly present.  Cardiovascular: Normal rate, regular rhythm, normal heart sounds and intact distal pulses.  Exam reveals no gallop.   Pulmonary/Chest: Effort normal and breath sounds normal. No respiratory distress. She has no wheezes. She has no rales.  Abdominal: Soft. Bowel sounds are normal. She exhibits no distension and no mass. There is no tenderness.  Musculoskeletal: She exhibits no edema and no tenderness.  Spider veins diffusely  Nl pedal pulses   Lymphadenopathy:    She has no cervical adenopathy.  Neurological: She is alert. She has normal reflexes. No cranial nerve deficit. She exhibits normal muscle tone. Coordination normal.  Skin: Skin is warm and dry. No rash noted. No pallor.  Psychiatric: She has a normal mood and affect.          Assessment & Plan:

## 2014-02-17 NOTE — Assessment & Plan Note (Signed)
Lab Results  Component Value Date   [id number] 707 02/10/2014   Better on current supplementation

## 2014-02-17 NOTE — Assessment & Plan Note (Signed)
Nl chem labs  Nl pedal pulses and no claudication  Disc some home remedies for cramps as well as stretches to try  Will update

## 2014-02-17 NOTE — Assessment & Plan Note (Signed)
Lab Results  Component Value Date   HGBA1C 6.7* 02/10/2014   overall stable  Great diet and self care No hypoglycemia  F/u 6 mo

## 2014-02-17 NOTE — Assessment & Plan Note (Signed)
bp in fair control at this time  BP Readings from Last 1 Encounters:  02/17/14 124/78   No changes needed Disc lifstyle change with low sodium diet and exercise  Labs reviewed

## 2014-02-17 NOTE — Patient Instructions (Signed)
Follow up after 12/2 for annual exam with labs prior  Take care of yourself  For leg and foot cramps - try to stretch several times per day  Keep up good fluid intake  Diet tonic water 4-6 oz a day may help  Mustard and vinegar have been known to help  Labs are stable

## 2014-02-18 ENCOUNTER — Telehealth: Payer: Self-pay | Admitting: Family Medicine

## 2014-02-18 NOTE — Telephone Encounter (Signed)
Relevant patient education mailed to patient.  

## 2014-02-24 ENCOUNTER — Telehealth: Payer: Self-pay | Admitting: *Deleted

## 2014-02-24 NOTE — Telephone Encounter (Signed)
My Mom saw Dr. Sandra Cockayne (Ophthalmologist) this morning and was told to contact us for a referral, hopefully through December.  I am sorry for not getting this before the visit.  Mom is not accustomed to this [employer].  I told her she needs to let me know whenever she or Dad has an appt with anyone other than this practice so maybe they'll do better in the future.  This happened recently when they went to their routine Dermatology appts too.  Again, sorry.

## 2014-03-17 ENCOUNTER — Other Ambulatory Visit: Payer: Self-pay | Admitting: Family Medicine

## 2014-03-18 ENCOUNTER — Other Ambulatory Visit: Payer: Self-pay | Admitting: *Deleted

## 2014-03-18 MED ORDER — CAPTOPRIL-HYDROCHLOROTHIAZIDE 25-15 MG PO TABS: ORAL_TABLET | ORAL | Status: DC

## 2014-03-18 MED ORDER — SITAGLIPTIN PHOSPHATE 100 MG PO TABS: 100.0000 mg | ORAL_TABLET | Freq: Every day | ORAL | Status: DC

## 2014-03-23 ENCOUNTER — Telehealth: Payer: Self-pay | Admitting: *Deleted

## 2014-03-23 NOTE — Telephone Encounter (Signed)
We could try changing her to lisinopril hct - have her check with ins to see if cheaper and let me know  thanks

## 2014-03-23 NOTE — Telephone Encounter (Signed)
Patient has hit the "doughnut hole" with her medications and two of them are extremely expensive.  Patient is applying for patient assistance with the Januvia from the manufacturer [employer]) but the Capozide is also very expensive.  Pharmacist has looked into the cost for the separate medications vs the combination and there is not much difference in cost.  We don't think there is patient assistance for this because it is a generic medication.  Patient is asking if there is something that she could use that might be more cost effective to replace the Capozide?

## 2014-03-24 MED ORDER — LISINOPRIL-HYDROCHLOROTHIAZIDE 20-12.5 MG PO TABS: 1.0000 | ORAL_TABLET | Freq: Every day | ORAL | Status: DC

## 2014-03-24 NOTE — Telephone Encounter (Signed)
That would be great.  Lisinopril/HCT is on the [facility] $4 list.  If you would let me know what strength, I will send it in or if it's just as easy for you to do it, that's fine too.  Thank you so much.

## 2014-03-24 NOTE — Addendum Note (Signed)
Addended by: Loura Pardon A on: 03/24/2014 10:16 AM   Modules accepted: Orders, Medications

## 2014-03-24 NOTE — Telephone Encounter (Signed)
I sent it to [facility] me know if any problems

## 2014-04-17 LAB — HM DIABETES EYE EXAM

## 2014-04-20 ENCOUNTER — Telehealth: Payer: Self-pay | Admitting: *Deleted

## 2014-04-20 NOTE — Telephone Encounter (Signed)
Patient is seeing her Cardiologist (Dr. Ubaldo Glassing) at [facility] on 04/23/14 and has [employer] so she will need a referral.

## 2014-04-20 NOTE — Telephone Encounter (Signed)
[employer] Auth completed

## 2014-06-04 ENCOUNTER — Other Ambulatory Visit: Payer: Self-pay | Admitting: *Deleted

## 2014-06-04 MED ORDER — ONETOUCH DELICA LANCETS 33G MISC: Status: DC

## 2014-06-30 ENCOUNTER — Ambulatory Visit (INDEPENDENT_AMBULATORY_CARE_PROVIDER_SITE_OTHER): Payer: Commercial Managed Care - HMO

## 2014-06-30 DIAGNOSIS — Z23 Encounter for immunization: Secondary | ICD-10-CM

## 2014-07-06 ENCOUNTER — Ambulatory Visit: Payer: Self-pay | Admitting: Family Medicine

## 2014-07-06 ENCOUNTER — Encounter: Payer: Self-pay | Admitting: Family Medicine

## 2014-07-07 ENCOUNTER — Encounter: Payer: Self-pay | Admitting: *Deleted

## 2014-07-15 ENCOUNTER — Ambulatory Visit (INDEPENDENT_AMBULATORY_CARE_PROVIDER_SITE_OTHER): Payer: Commercial Managed Care - HMO | Admitting: Family Medicine

## 2014-07-15 ENCOUNTER — Encounter: Payer: Self-pay | Admitting: Family Medicine

## 2014-07-15 ENCOUNTER — Ambulatory Visit: Payer: Commercial Managed Care - HMO | Admitting: Family Medicine

## 2014-07-15 VITALS — BP 114/62 | HR 78 | Temp 98.2°F | Ht 61.25 in | Wt 111.8 lb

## 2014-07-15 DIAGNOSIS — J209 Acute bronchitis, unspecified: Secondary | ICD-10-CM

## 2014-07-15 MED ORDER — DOXYCYCLINE HYCLATE 100 MG PO TABS: 100.0000 mg | ORAL_TABLET | Freq: Two times a day (BID) | ORAL | Status: DC

## 2014-07-15 NOTE — Progress Notes (Signed)
Subjective:    Patient ID: Bianca Mueller, female    DOB: 1925/11/04, 80 y.o.   MRN: [id number]  HPI Here with uri symptoms  Started last Wednesday  Only improving a little   Bad congestion in head and chest  Phlegm is white/green -thick   (and a little blood from nose) She had some facial pain (eyes and head)- that is improved now  No fever   (has had some chills in the afternoons)   No sob or wheezing   Is dizzy as well , and a little nauseated No vomiting   Patient Active Problem List   Diagnosis Date Noted  . NSTEMI (non-ST elevated myocardial infarction) 07/18/2014  . Dyslipidemia 07/18/2014  . Acute bronchitis 07/15/2014  . Leg cramps 02/17/2014  . Encounter for Medicare annual wellness exam 08/19/2013  . Skin lesion 03/16/2013  . Other screening mammogram 05/17/2012  . Adult failure to thrive 05/17/2011  . Palpitations 02/16/2011  . DM2 (diabetes mellitus, type 2) 02/14/2011  . ANEMIA, VITAMIN B12 DEFICIENCY 09/26/2010  . PEDAL EDEMA 03/16/2010  . [place] ALLERGY 09/06/2009  . UNSPECIFIED VITAMIN D DEFICIENCY 03/02/2009  . DIVERTICULITIS, COLON 11/12/2008  . COLONIC POLYPS, HX OF 09/02/2008  . HYPERCHOLESTEROLEMIA 12/10/2006  . Essential hypertension 12/10/2006  . OSTEOPOROSIS 03/18/2002   Past Medical History  Diagnosis Date  . Diabetes mellitus     type II  . Osteoporosis   . Hyperlipidemia   . Hypertension   . Hypercalcemia   . CAD (coronary artery disease)   . Zoster     past  . Diverticulosis   . Hx of adenomatous colonic polyps 2010    last colonscopy 2010,Dr Vira Agar - no need for further  colonoscopy given age  . Myocardial infarct     1998   Past Surgical History  Procedure Laterality Date  . Abdominal hysterectomy      ovaries intact  . Shoulder surgery      right   History  Substance Use Topics  . Smoking status: Former Research scientist (life sciences)  . Smokeless tobacco: Not on file  . Alcohol Use: No   Family History  Problem Relation Age of  Onset  . Heart disease Mother     CAD  . Heart disease Father     CAD  . Heart disease Brother     CAD   Allergies  Allergen Reactions  . Alendronate Sodium   . Isosorbide   . Metformin And Related     Decreases appetite resulting in weight loss  . Metronidazole     REACTION: sick/ GI  . Nifedipine    No current facility-administered medications on file prior to visit.   Current Outpatient Prescriptions on File Prior to Visit  Medication Sig Dispense Refill  . cloNIDine (CATAPRES) 0.2 MG tablet 1/2 tablet by mouth every AM, 1/2 tablet by mouth at lunch and 1 tablet by mouth every PM. 180 tablet 3  . glucose blood (ONE TOUCH ULTRA TEST) test strip USE ONE STRIP TO CHECK BLOOD SUGAR ONCE DAILY AND PRN FOR DM 250.0 100 each 3  . lisinopril-hydrochlorothiazide (ZESTORETIC) 20-12.5 MG per tablet Take 1 tablet by mouth daily. 90 tablet 3  . nitroGLYCERIN (NITROSTAT) 0.4 MG SL tablet Place 1 tablet (0.4 mg total) under the tongue every 5 (five) minutes as needed. For chest pain and if not improved seek medical attention in ER. 15 tablet 0  . ONETOUCH DELICA LANCETS 94M MISC Use as instructed to check blood sugar once  daily or as needed.  250.00 100 each 3    Review of Systems Review of Systems  Constitutional: Negative for fever, appetite change,  and unexpected weight change.  ENT pos for post nasal drip / neg for ST Eyes: Negative for pain and visual disturbance.  Respiratory: Negative for wheeze and shortness of breath.   Cardiovascular: Negative for cp or palpitations    Gastrointestinal: Negative for nausea, diarrhea and constipation.  Genitourinary: Negative for urgency and frequency.  Skin: Negative for pallor or rash   Neurological: Negative for weakness, light-headedness, numbness and headaches.  Hematological: Negative for adenopathy. Does not bruise/bleed easily.  Psychiatric/Behavioral: Negative for dysphoric mood. The patient is not nervous/anxious.           Objective:   Physical Exam  Constitutional: She appears well-developed and well-nourished. No distress.  HENT:  Head: Normocephalic and atraumatic.  Right Ear: External ear normal.  Left Ear: External ear normal.  Mouth/Throat: Oropharynx is clear and moist. No oropharyngeal exudate.  Boggy nares Clear rhinorrhea No sinus tenderness   Eyes: Conjunctivae are normal. Pupils are equal, round, and reactive to light.  Neck: Normal range of motion. Neck supple.  Cardiovascular: Normal rate, regular rhythm, normal heart sounds and intact distal pulses.  Exam reveals no gallop.   Pulmonary/Chest: Effort normal and breath sounds normal. No respiratory distress. She has no wheezes. She has no rales.  Few scattered rhonchi  No rales No wheeze   Good air exch   Lymphadenopathy:    She has no cervical adenopathy.  Neurological: She is alert.  Skin: Skin is warm and dry. No rash noted.  Psychiatric: She has a normal mood and affect.          Assessment & Plan:   Problem List Items Addressed This Visit     Respiratory   Acute bronchitis - Primary     In conj with viral uri for a week Cover with doxycyline Robitussin otc for cough -per pt is helping  Disc symptomatic care - see instructions on AVS  Update if not starting to improve in a week or if worsening

## 2014-07-15 NOTE — Progress Notes (Signed)
Pre visit review using our clinic review tool, if applicable. No additional management support is needed unless otherwise documented below in the visit note. 

## 2014-07-15 NOTE — Assessment & Plan Note (Signed)
In conj with viral uri for a week Cover with doxycyline Robitussin otc for cough -per pt is helping  Disc symptomatic care - see instructions on AVS  Update if not starting to improve in a week or if worsening

## 2014-07-15 NOTE — Patient Instructions (Signed)
I think you have an upper respiratory infection that started as a cold and now you have some bronchitis Take the doxycycline as directed  Drink lots of fluids and rest  Use robitussin as needed for cough Update if not starting to improve in a week or if worsening

## 2014-07-18 ENCOUNTER — Emergency Department (HOSPITAL_COMMUNITY): Payer: Medicare HMO

## 2014-07-18 ENCOUNTER — Encounter (HOSPITAL_COMMUNITY): Payer: Self-pay | Admitting: Emergency Medicine

## 2014-07-18 ENCOUNTER — Inpatient Hospital Stay (HOSPITAL_COMMUNITY)
Admission: EM | Admit: 2014-07-18 | Discharge: 2014-07-29 | DRG: 280 | Disposition: A | Discharge status: Home / Self Care | Payer: Medicare HMO | Attending: Cardiology | Admitting: Cardiology

## 2014-07-18 DIAGNOSIS — I252 Old myocardial infarction: Secondary | ICD-10-CM | POA: Diagnosis not present

## 2014-07-18 DIAGNOSIS — I5033 Acute on chronic diastolic (congestive) heart failure: Secondary | ICD-10-CM | POA: Diagnosis not present

## 2014-07-18 DIAGNOSIS — Z87891 Personal history of nicotine dependence: Secondary | ICD-10-CM

## 2014-07-18 DIAGNOSIS — M810 Age-related osteoporosis without current pathological fracture: Secondary | ICD-10-CM | POA: Diagnosis present

## 2014-07-18 DIAGNOSIS — I4892 Unspecified atrial flutter: Secondary | ICD-10-CM | POA: Diagnosis not present

## 2014-07-18 DIAGNOSIS — I214 Non-ST elevation (NSTEMI) myocardial infarction: Principal | ICD-10-CM | POA: Diagnosis present

## 2014-07-18 DIAGNOSIS — E876 Hypokalemia: Secondary | ICD-10-CM | POA: Diagnosis not present

## 2014-07-18 DIAGNOSIS — I10 Essential (primary) hypertension: Secondary | ICD-10-CM | POA: Diagnosis present

## 2014-07-18 DIAGNOSIS — R740 Nonspecific elevation of levels of transaminase and lactic acid dehydrogenase [LDH]: Secondary | ICD-10-CM | POA: Diagnosis present

## 2014-07-18 DIAGNOSIS — E785 Hyperlipidemia, unspecified: Secondary | ICD-10-CM | POA: Diagnosis present

## 2014-07-18 DIAGNOSIS — I2510 Atherosclerotic heart disease of native coronary artery without angina pectoris: Secondary | ICD-10-CM | POA: Diagnosis present

## 2014-07-18 DIAGNOSIS — D649 Anemia, unspecified: Secondary | ICD-10-CM | POA: Diagnosis present

## 2014-07-18 DIAGNOSIS — E1165 Type 2 diabetes mellitus with hyperglycemia: Secondary | ICD-10-CM | POA: Diagnosis present

## 2014-07-18 DIAGNOSIS — Z79899 Other long term (current) drug therapy: Secondary | ICD-10-CM | POA: Diagnosis not present

## 2014-07-18 DIAGNOSIS — I480 Paroxysmal atrial fibrillation: Secondary | ICD-10-CM | POA: Diagnosis not present

## 2014-07-18 DIAGNOSIS — I2584 Coronary atherosclerosis due to calcified coronary lesion: Secondary | ICD-10-CM | POA: Diagnosis present

## 2014-07-18 DIAGNOSIS — J019 Acute sinusitis, unspecified: Secondary | ICD-10-CM | POA: Diagnosis present

## 2014-07-18 DIAGNOSIS — R932 Abnormal findings on diagnostic imaging of liver and biliary tract: Secondary | ICD-10-CM

## 2014-07-18 DIAGNOSIS — Z9861 Coronary angioplasty status: Secondary | ICD-10-CM

## 2014-07-18 DIAGNOSIS — R7401 Elevation of levels of liver transaminase levels: Secondary | ICD-10-CM | POA: Clinically undetermined

## 2014-07-18 DIAGNOSIS — Z8249 Family history of ischemic heart disease and other diseases of the circulatory system: Secondary | ICD-10-CM | POA: Diagnosis not present

## 2014-07-18 DIAGNOSIS — Z7982 Long term (current) use of aspirin: Secondary | ICD-10-CM | POA: Diagnosis not present

## 2014-07-18 DIAGNOSIS — R0602 Shortness of breath: Secondary | ICD-10-CM | POA: Diagnosis present

## 2014-07-18 DIAGNOSIS — R52 Pain, unspecified: Secondary | ICD-10-CM

## 2014-07-18 DIAGNOSIS — I4891 Unspecified atrial fibrillation: Secondary | ICD-10-CM | POA: Diagnosis present

## 2014-07-18 DIAGNOSIS — M549 Dorsalgia, unspecified: Secondary | ICD-10-CM

## 2014-07-18 DIAGNOSIS — R110 Nausea: Secondary | ICD-10-CM | POA: Insufficient documentation

## 2014-07-18 DIAGNOSIS — E119 Type 2 diabetes mellitus without complications: Secondary | ICD-10-CM

## 2014-07-18 HISTORY — DX: Unspecified atrial fibrillation: I48.91

## 2014-07-18 LAB — CBC
HCT: 34.4 % — ABNORMAL LOW (ref 36.0–46.0)
HEMOGLOBIN: 11.3 g/dL — AB (ref 12.0–15.0)
MCH: 29.1 pg (ref 26.0–34.0)
MCHC: 32.8 g/dL (ref 30.0–36.0)
MCV: 88.7 fL (ref 78.0–100.0)
Platelets: 453 10*3/uL — ABNORMAL HIGH (ref 150–400)
RBC: 3.88 MIL/uL (ref 3.87–5.11)
RDW: 13.3 % (ref 11.5–15.5)
WBC: 19.1 10*3/uL — ABNORMAL HIGH (ref 4.0–10.5)

## 2014-07-18 LAB — BASIC METABOLIC PANEL
Anion gap: 23 — ABNORMAL HIGH (ref 5–15)
BUN: 18 mg/dL (ref 6–23)
CALCIUM: 8.4 mg/dL (ref 8.4–10.5)
CO2: 15 mEq/L — ABNORMAL LOW (ref 19–32)
CREATININE: 0.78 mg/dL (ref 0.50–1.10)
Chloride: 103 mEq/L (ref 96–112)
GFR calc Af Amer: 86 mL/min — ABNORMAL LOW (ref >90)
GFR calc non Af Amer: 74 mL/min — ABNORMAL LOW (ref >90)
GLUCOSE: 366 mg/dL — AB (ref 70–99)
Potassium: 3.9 mEq/L (ref 3.7–5.3)
SODIUM: 141 meq/L (ref 137–147)

## 2014-07-18 LAB — TROPONIN I: TROPONIN I: 3.29 ng/mL — AB (ref <0.30)

## 2014-07-18 LAB — I-STAT TROPONIN, ED: TROPONIN I, POC: 4.37 ng/mL — AB (ref 0.00–0.08)

## 2014-07-18 LAB — PRO B NATRIURETIC PEPTIDE: PRO B NATRI PEPTIDE: 2238 pg/mL — AB (ref 0–450)

## 2014-07-18 LAB — MAGNESIUM: Magnesium: 2.2 mg/dL (ref 1.5–2.5)

## 2014-07-18 LAB — CBG MONITORING, ED: Glucose-Capillary: 304 mg/dL — ABNORMAL HIGH (ref 70–99)

## 2014-07-18 MED ORDER — IOHEXOL 350 MG/ML SOLN
100.0000 mL | Freq: Once | INTRAVENOUS | Status: AC | PRN
  Administered 2014-07-18: 100 mL via INTRAVENOUS

## 2014-07-18 MED ORDER — FENTANYL CITRATE 0.05 MG/ML IJ SOLN
25.0000 ug | INTRAMUSCULAR | Status: DC | PRN
  Administered 2014-07-19 – 2014-07-20 (×3): 25 ug via INTRAVENOUS
  Filled 2014-07-18 (×3): qty 2

## 2014-07-18 MED ORDER — ASPIRIN 81 MG PO CHEW
324.0000 mg | CHEWABLE_TABLET | Freq: Once | ORAL | Status: AC
  Administered 2014-07-18: 324 mg via ORAL
  Filled 2014-07-18: qty 4

## 2014-07-18 MED ORDER — NITROGLYCERIN 0.4 MG SL SUBL
0.4000 mg | SUBLINGUAL_TABLET | SUBLINGUAL | Status: DC | PRN
Start: 2014-07-18 — End: 2014-07-29

## 2014-07-18 MED ORDER — HEPARIN (PORCINE) IN NACL 100-0.45 UNIT/ML-% IJ SOLN
1100.0000 [IU]/h | INTRAMUSCULAR | Status: DC
  Administered 2014-07-18: 600 [IU]/h via INTRAVENOUS
  Administered 2014-07-20: 800 [IU]/h via INTRAVENOUS
  Administered 2014-07-22: 1100 [IU]/h via INTRAVENOUS
  Filled 2014-07-18 (×9): qty 250

## 2014-07-18 MED ORDER — HEPARIN BOLUS VIA INFUSION
3000.0000 [IU] | Freq: Once | INTRAVENOUS | Status: DC
Start: 2014-07-18 — End: 2014-07-18
  Filled 2014-07-18: qty 3000

## 2014-07-18 MED ORDER — PROCHLORPERAZINE EDISYLATE 5 MG/ML IJ SOLN
10.0000 mg | Freq: Four times a day (QID) | INTRAMUSCULAR | Status: DC | PRN
  Administered 2014-07-19 (×2): 10 mg via INTRAVENOUS
  Filled 2014-07-18 (×3): qty 2

## 2014-07-18 MED ORDER — ONDANSETRON HCL 4 MG/2ML IJ SOLN
4.0000 mg | Freq: Four times a day (QID) | INTRAMUSCULAR | Status: DC | PRN
  Administered 2014-07-19 – 2014-07-20 (×3): 4 mg via INTRAVENOUS
  Filled 2014-07-18 (×3): qty 2

## 2014-07-18 MED ORDER — ATORVASTATIN CALCIUM 80 MG PO TABS
80.0000 mg | ORAL_TABLET | Freq: Every day | ORAL | Status: DC
  Administered 2014-07-19: 80 mg via ORAL
  Filled 2014-07-18 (×2): qty 1

## 2014-07-18 MED ORDER — METOCLOPRAMIDE HCL 5 MG/ML IJ SOLN
5.0000 mg | Freq: Once | INTRAMUSCULAR | Status: DC
  Filled 2014-07-18: qty 2

## 2014-07-18 MED ORDER — FENTANYL CITRATE 0.05 MG/ML IJ SOLN
100.0000 ug | Freq: Once | INTRAMUSCULAR | Status: AC
  Administered 2014-07-18: 100 ug via INTRAVENOUS
  Filled 2014-07-18: qty 2

## 2014-07-18 MED ORDER — ONDANSETRON HCL 4 MG/2ML IJ SOLN
4.0000 mg | Freq: Once | INTRAMUSCULAR | Status: AC
  Administered 2014-07-18: 4 mg via INTRAVENOUS
  Filled 2014-07-18: qty 2

## 2014-07-18 MED ORDER — ACETAMINOPHEN 325 MG PO TABS
650.0000 mg | ORAL_TABLET | ORAL | Status: DC | PRN
  Administered 2014-07-19 – 2014-07-24 (×5): 650 mg via ORAL
  Filled 2014-07-18 (×5): qty 2

## 2014-07-18 MED ORDER — PANTOPRAZOLE SODIUM 40 MG PO TBEC
40.0000 mg | DELAYED_RELEASE_TABLET | Freq: Every day | ORAL | Status: DC
  Administered 2014-07-19 – 2014-07-29 (×11): 40 mg via ORAL
  Filled 2014-07-18 (×10): qty 1

## 2014-07-18 MED ORDER — METOCLOPRAMIDE HCL 5 MG/ML IJ SOLN
5.0000 mg | Freq: Once | INTRAMUSCULAR | Status: AC
  Administered 2014-07-18: 5 mg via INTRAVENOUS

## 2014-07-18 MED ORDER — METOPROLOL TARTRATE 12.5 MG HALF TABLET
12.5000 mg | ORAL_TABLET | Freq: Two times a day (BID) | ORAL | Status: DC
  Administered 2014-07-19 – 2014-07-23 (×10): 12.5 mg via ORAL
  Filled 2014-07-18 (×12): qty 1

## 2014-07-18 MED ORDER — METOCLOPRAMIDE HCL 5 MG/ML IJ SOLN
5.0000 mg | Freq: Once | INTRAMUSCULAR | Status: AC
  Administered 2014-07-18: 5 mg via INTRAVENOUS
  Filled 2014-07-18: qty 2

## 2014-07-18 MED ORDER — ASPIRIN EC 81 MG PO TBEC
81.0000 mg | DELAYED_RELEASE_TABLET | Freq: Every day | ORAL | Status: DC
Start: 2014-07-19 — End: 2014-07-29
  Administered 2014-07-19 – 2014-07-29 (×10): 81 mg via ORAL
  Filled 2014-07-18 (×11): qty 1

## 2014-07-18 NOTE — ED Notes (Signed)
Bed: RESA Expected date: 07/18/14 Expected time: 5:42 PM Means of arrival:  Comments: Chest  Pain

## 2014-07-18 NOTE — ED Notes (Addendum)
Per ems pt c/o nausea/ vomiting, en route, pt started complaining of intermittent upper back pain 10/10. Hx MI. EKG showed tachycardia and PAC.  Reports she was put on doxy abx on Wednesday. Lung sounds clear.  20 L AC. 456ml NS infused. Normally pt able to ambulate, today pt was too weak.

## 2014-07-18 NOTE — ED Provider Notes (Signed)
CSN: [id number]     Arrival date & time 07/18/14  1747 History   First MD Initiated Contact with Patient 07/18/14 1837     Chief Complaint  Patient presents with  . Back Pain     (Consider location/radiation/quality/duration/timing/severity/associated sxs/prior Treatment) HPI Complains of back pain from upper back to lower back midline nonradiating onset approximately 4:30 PM today pain is moderate to severe. Nothing makes pain better or worse. Associated symptoms include mild shortness of breath which has since resolved, sweatiness. Denies nausea. No treatment prior to coming here. Pain is nonradiating. No other associated symptoms no Past Medical History  Diagnosis Date  . Diabetes mellitus     type II  . Osteoporosis   . Hyperlipidemia   . Hypertension   . Hypercalcemia   . CAD (coronary artery disease)   . Zoster     past  . Diverticulosis   . Hx of adenomatous colonic polyps 2010    last colonscopy 2010,Dr Vira Agar - no need for further  colonoscopy given age  . Myocardial infarct     1998   history of present illness continued no chest pain no abdominal pain Past Surgical History  Procedure Laterality Date  . Abdominal hysterectomy      ovaries intact  . Shoulder surgery      right   Family History  Problem Relation Age of Onset  . Heart disease Mother     CAD  . Heart disease Father     CAD  . Heart disease Brother     CAD   History  Substance Use Topics  . Smoking status: Former Research scientist (life sciences)  . Smokeless tobacco: Not on file  . Alcohol Use: No   OB History   Grav Para Term Preterm Abortions TAB SAB Ect Mult Living                 Review of Systems  Constitutional: Negative.   HENT: Negative.   Respiratory: Positive for shortness of breath.   Cardiovascular: Negative.   Gastrointestinal: Negative.   Musculoskeletal: Positive for back pain.  Skin: Negative.   Neurological: Negative.   Psychiatric/Behavioral: Negative.   All other systems reviewed and  are negative.     Allergies  Alendronate sodium; Isosorbide; Metformin and related; Metronidazole; and Nifedipine  Home Medications   Prior to Admission medications   Medication Sig Start Date End Date Taking? Authorizing Provider  amLODipine (NORVASC) 5 MG tablet Take 5 mg by mouth at bedtime.   Yes Historical Provider, MD  aspirin EC 325 MG tablet Take 325 mg by mouth daily.   Yes Historical Provider, MD  cloNIDine (CATAPRES) 0.2 MG tablet 1/2 tablet by mouth every AM, 1/2 tablet by mouth at lunch and 1 tablet by mouth every PM. 10/02/13  Yes Abner Greenspan, MD  esomeprazole (NEXIUM) 40 MG capsule Take 40 mg by mouth daily at 12 noon.   Yes Historical Provider, MD  glipiZIDE (GLUCOTROL XL) 10 MG 24 hr tablet Take 10 mg by mouth daily with breakfast.   Yes Historical Provider, MD  labetalol (NORMODYNE) 100 MG tablet Take 100 mg by mouth daily.   Yes Historical Provider, MD  lisinopril-hydrochlorothiazide (ZESTORETIC) 20-12.5 MG per tablet Take 1 tablet by mouth daily. 03/24/14  Yes Abner Greenspan, MD  simvastatin (ZOCOR) 20 MG tablet Take 20 mg by mouth daily at 6 PM.   Yes Historical Provider, MD  sitaGLIPtin (JANUVIA) 100 MG tablet Take 100 mg by mouth daily.  Yes Historical Provider, MD  doxycycline (VIBRA-TABS) 100 MG tablet Take 1 tablet (100 mg total) by mouth 2 (two) times daily. 07/15/14   [place], MD  glucose blood (ONE TOUCH ULTRA TEST) test strip USE ONE STRIP TO CHECK BLOOD SUGAR ONCE DAILY AND PRN FOR DM 250.0 10/02/13   Abner Greenspan, MD  nitroGLYCERIN (NITROSTAT) 0.4 MG SL tablet Place 1 tablet (0.4 mg total) under the tongue every 5 (five) minutes as needed. For chest pain and if not improved seek medical attention in ER. 07/03/13   Abner Greenspan, MD  [facility] LANCETS 57D MISC Use as instructed to check blood sugar once daily or as needed.  250.00 06/04/14   Marne A Tower, MD   BP 107/60  Pulse 108  Temp(Src) 98.1 F (36.7 C) (Rectal)  Resp 22  SpO2  98% Physical Exam  Nursing note and vitals reviewed. Constitutional: She appears well-developed and well-nourished.  HENT:  Head: Normocephalic and atraumatic.  Eyes: Conjunctivae are normal. Pupils are equal, round, and reactive to light.  Neck: Neck supple. No tracheal deviation present. No thyromegaly present.  Cardiovascular: Regular rhythm.   No murmur heard. Tachycardic  Pulmonary/Chest: Effort normal and breath sounds normal.  Abdominal: Soft. Bowel sounds are normal. She exhibits no distension. There is no tenderness.  Musculoskeletal: Normal range of motion. She exhibits no edema and no tenderness.  Neurological: She is alert. Coordination normal.  Skin: Skin is warm and dry. No rash noted.  Psychiatric: She has a normal mood and affect.    ED Course  Procedures (including critical care time) Labs Review Labs Reviewed  I-STAT TROPOININ, ED - Abnormal; Notable for the following:    Troponin i, poc 4.37 (*)    All other components within normal limits  CBC  BASIC METABOLIC PANEL    Imaging Review No results found.   EKG Interpretation   Date/Time:  Saturday July 18 2014 17:53:38 EDT Ventricular Rate:  104 PR Interval:  135 QRS Duration: 67 QT Interval:  357 QTC Calculation: 470 R Axis:   74 Text Interpretation:  Sinus tachycardia Posterior infarct, acute (LCx)  SINCE LAST TRACING HEART RATE HAS INCREASED Confirmed by Winfred Leeds  MD,  Bawi Lakins [phone]) on 07/18/2014 5:57:36 PM     8 PM patient is pain-free after treatment with intravenous fentanyl. She complains of nausea. She is alert Glasgow Coma Score 15. Zofran ordered.  9 PM nausea not improved after treatment with Zofran. Reglan ordered. CT angiogram reviewed. No aortic dissection. Heparin ordered per pharmacy protocol  1010pm remiasns pain -free. Nausea improved but not gone. Additional reglan ordered  1105 pm nausea continues to inmprove Results for orders placed during the hospital encounter of  07/18/14  CBC      Result Value Ref Range   WBC 19.1 (*) 4.0 - 10.5 K/uL   RBC 3.88  3.87 - 5.11 MIL/uL   Hemoglobin 11.3 (*) 12.0 - 15.0 g/dL   HCT 34.4 (*) 36.0 - 46.0 %   MCV 88.7  78.0 - 100.0 fL   MCH 29.1  26.0 - 34.0 pg   MCHC 32.8  30.0 - 36.0 g/dL   RDW 13.3  11.5 - 15.5 %   Platelets 453 (*) 150 - 400 K/uL  BASIC METABOLIC PANEL      Result Value Ref Range   Sodium 141  137 - 147 mEq/L   Potassium 3.9  3.7 - 5.3 mEq/L   Chloride 103  96 - 112 mEq/L  CO2 15 (*) 19 - 32 mEq/L   Glucose, Bld 366 (*) 70 - 99 mg/dL   BUN 18  6 - 23 mg/dL   Creatinine, Ser 0.78  0.50 - 1.10 mg/dL   Calcium 8.4  8.4 - 10.5 mg/dL   GFR calc non Af Amer 74 (*) >90 mL/min   GFR calc Af Amer 86 (*) >90 mL/min   Anion gap 23 (*) 5 - 15  I-STAT TROPOININ, ED      Result Value Ref Range   Troponin i, poc 4.37 (*) 0.00 - 0.08 ng/mL   Comment 3            Dg Chest Port 1 View  07/18/2014   CLINICAL DATA:  Nausea vomiting and decreased oxygen saturation. Former smoker.  EXAM: PORTABLE CHEST - 1 VIEW  COMPARISON:  04/04/2014.  FINDINGS: Mild cardiac enlargement. Atherosclerotic disease is noted within the aortic arch. There are 2 nodular densities in the left base that are indeterminate. No pleural effusion identified. Right lung is clear.  IMPRESSION: 1. Two nodular opacities are identified in the left base. In this patient who has a history of smoking a initial further workup with a dedicated PA and lateral upright radiographs are recommended.   Electronically Signed   By: Kerby Moors M.D.   On: 07/18/2014 19:25   Ct Angio Chest Aorta W/cm &/or Wo/cm  07/18/2014   CLINICAL DATA:  Nausea and vomiting.  Intermittent upper back pain.  EXAM: CT ANGIOGRAPHY CHEST, ABDOMEN AND PELVIS  TECHNIQUE: Multidetector CT imaging through the chest, abdomen and pelvis was performed using the standard protocol during bolus administration of intravenous contrast. Multiplanar reconstructed images and MIPs were obtained  and reviewed to evaluate the vascular anatomy.  CONTRAST:  131mL OMNIPAQUE IOHEXOL 350 MG/ML SOLN  COMPARISON:  None.  FINDINGS: CTA CHEST FINDINGS  Mediastinum: The heart size is normal. There is a small to moderate pericardial effusion identified. There is calcified atherosclerotic disease involving the thoracic aorta. No evidence for 8 dissection. The main pulmonary artery appears patent there is no abnormal filling defects within the main pulmonary artery or its branches.  Lungs/Pleura: There is atelectasis/ scarring identified within the lung bases. A calcified granuloma is noted in the left base. No airspace consolidation.  Musculoskeletal: There is mild spondylosis identified within the thoracic spine.  Review of the MIP images confirms the above findings.  CTA ABDOMEN AND PELVIS FINDINGS  Hepatobiliary: Normal appearance of the liver. There is diffuse gallbladder wall thickening measuring up to 8 mm. There is no biliary dilatation.  Pancreas: Normal appearance of the pancreas.  Spleen: The spleen is unremarkable.  Adrenals/Urinary Tract: The adrenal glands are both normal. There is mild bilateral renal cortical thinning. The urinary bladder is within normal limits.  Stomach/Bowel: Small hiatal hernia. The stomach is otherwise unremarkable. The small bowel loops have a normal course and caliber without obstruction. The appendix is visualized and appears normal. Multiple distal colonic diverticula identified without acute inflammation.  Vascular/Lymphatic: There is calcified atherosclerotic disease involving the normal caliber abdominal aorta. There is no dissection. No retroperitoneal adenopathy.  Reproductive: Previous hysterectomy. The adnexal structures appear normal.  Other: There is no free fluid or fluid collections within the abdomen or pelvis.  Musculoskeletal: Review of the visualized osseous structures is significant for degenerative disc disease within the L4-5 level.  Review of the MIP images  confirms the above findings.  IMPRESSION: 1. No evidence for aortic dissection. 2. Atherosclerotic disease. 3. Gallbladder wall  thickening. If there are clinical signs or symptoms of cholecystitis consider further investigation with abdominal sonogram. 4. Hiatal hernia 5. Thoracic and lumbar degenerative disc disease.   Electronically Signed   By: Kerby Moors M.D.   On: 07/18/2014 20:35   Ct Cta Abd/pel W/cm &/or W/o Cm  07/18/2014   CLINICAL DATA:  Nausea and vomiting.  Intermittent upper back pain.  EXAM: CT ANGIOGRAPHY CHEST, ABDOMEN AND PELVIS  TECHNIQUE: Multidetector CT imaging through the chest, abdomen and pelvis was performed using the standard protocol during bolus administration of intravenous contrast. Multiplanar reconstructed images and MIPs were obtained and reviewed to evaluate the vascular anatomy.  CONTRAST:  121mL OMNIPAQUE IOHEXOL 350 MG/ML SOLN  COMPARISON:  None.  FINDINGS: CTA CHEST FINDINGS  Mediastinum: The heart size is normal. There is a small to moderate pericardial effusion identified. There is calcified atherosclerotic disease involving the thoracic aorta. No evidence for 8 dissection. The main pulmonary artery appears patent there is no abnormal filling defects within the main pulmonary artery or its branches.  Lungs/Pleura: There is atelectasis/ scarring identified within the lung bases. A calcified granuloma is noted in the left base. No airspace consolidation.  Musculoskeletal: There is mild spondylosis identified within the thoracic spine.  Review of the MIP images confirms the above findings.  CTA ABDOMEN AND PELVIS FINDINGS  Hepatobiliary: Normal appearance of the liver. There is diffuse gallbladder wall thickening measuring up to 8 mm. There is no biliary dilatation.  Pancreas: Normal appearance of the pancreas.  Spleen: The spleen is unremarkable.  Adrenals/Urinary Tract: The adrenal glands are both normal. There is mild bilateral renal cortical thinning. The urinary  bladder is within normal limits.  Stomach/Bowel: Small hiatal hernia. The stomach is otherwise unremarkable. The small bowel loops have a normal course and caliber without obstruction. The appendix is visualized and appears normal. Multiple distal colonic diverticula identified without acute inflammation.  Vascular/Lymphatic: There is calcified atherosclerotic disease involving the normal caliber abdominal aorta. There is no dissection. No retroperitoneal adenopathy.  Reproductive: Previous hysterectomy. The adnexal structures appear normal.  Other: There is no free fluid or fluid collections within the abdomen or pelvis.  Musculoskeletal: Review of the visualized osseous structures is significant for degenerative disc disease within the L4-5 level.  Review of the MIP images confirms the above findings.  IMPRESSION: 1. No evidence for aortic dissection. 2. Atherosclerotic disease. 3. Gallbladder wall thickening. If there are clinical signs or symptoms of cholecystitis consider further investigation with abdominal sonogram. 4. Hiatal hernia 5. Thoracic and lumbar degenerative disc disease.   Electronically Signed   By: Kerby Moors M.D.   On: 07/18/2014 20:35    MDM  Spoke with Dr. Claiborne Billings who will come to the emergency department and arrange for admission Diagnosis NTSEMI #2 Hyperglycemia Final diagnoses:  None  CRITICAL CARE Performed by: Orlie Dakin Total critical care time: 30 minute Critical care time was exclusive of separately billable procedures and treating other patients. Critical care was necessary to treat or prevent imminent or life-threatening deterioration. Critical care was time spent personally by me on the following activities: development of treatment plan with patient and/or surrogate as well as nursing, discussions with consultants, evaluation of patient's response to treatment, examination of patient, obtaining history from patient or surrogate, ordering and performing  treatments and interventions, ordering and review of laboratory studies, ordering and review of radiographic studies, pulse oximetry and re-evaluation of patient's condition.      Orlie Dakin, MD 07/18/14 2312

## 2014-07-18 NOTE — Progress Notes (Signed)
ANTICOAGULATION CONSULT NOTE - Initial Consult  Pharmacy Consult for Heparin  Indication: chest pain/ACS  Allergies  Allergen Reactions  . Alendronate Sodium   . Isosorbide   . Metformin And Related     Decreases appetite resulting in weight loss  . Metronidazole     REACTION: sick/ GI  . Nifedipine     Patient Measurements:   Heparin Dosing Weight: 51 kg   Vital Signs: Temp: 98.1 F (36.7 C) (10/31 1753) Temp Source: Rectal (10/31 1753) BP: 98/52 mmHg (10/31 2106) Pulse Rate: 104 (10/31 2106)  Labs:  Recent Labs  07/18/14 1820  HGB 11.3*  HCT 34.4*  PLT 453*  CREATININE 0.78    The CrCl is unknown because both a height and weight (above a minimum accepted value) are required for this calculation.   Medical History: Past Medical History  Diagnosis Date  . Diabetes mellitus     type II  . Osteoporosis   . Hyperlipidemia   . Hypertension   . Hypercalcemia   . CAD (coronary artery disease)   . Zoster     past  . Diverticulosis   . Hx of adenomatous colonic polyps 2010    last colonscopy 2010,Dr Vira Agar - no need for further  colonoscopy given age  . Myocardial infarct     1998    Medications:   (Not in a hospital admission)  Assessment: 36 YOF with complains of back pain with mild shortness of breath which has since resolved. Initial troponin elevated at 4.37. Pharmacy consulted to start heparin infusion for ACS/NSTEMI. Hgb 11.3, Plt 453. Pt is not on any anticoagulants prior to admission.   Goal of Therapy:  Heparin level 0.3-0.7 units/ml Monitor platelets by anticoagulation protocol: Yes   Plan:  -Heparin 3000 units bolus followed by 600 units/hr  -F/u HL at 0600 tomorrow  -Monitor daily HL, CBC and s/s of bleeding   Albertina Parr, PharmD.  Clinical Pharmacist Pager [phone]

## 2014-07-18 NOTE — H&P (Addendum)
Bianca Mueller is an 80 y.o. female.    Chief Complaint: back pain, found to have NSTEMI Primary Cardiologist: Dr. Ubaldo Glassing HPI: Bianca Mueller 80 yo woman with PMH of T2DM, Hypertension, Dyslipidemia, family history in mother and father with CAD/MI in their 74s, CAD wiith anterior MI treated with TPA in 1989 and subsequent angioplasty with EF of 38% at that time and 95% at [facility]. She's had a subsequent myoview in '04 revealing no ischemia and EF of 58%. She presents tonight with upper back pain characterized as sharp with some radiation to her shoulders leading to presentation. In the ER she had some associated shortness of breath as well. CTA obtained to evaluate for dissection. Troponin resulted at 4.4 leading to cardiology consultation. I spoke at lengths with Bianca Mueller and her daughters. She has no recent Chest pain but she did back in '89 her with MI. She can walk as far as she likes without limitations. She frequently walks 6-7 stairs in her home and walks at the grocery store. No recent syncope. Pain improved/resolved with fentanyl. I discussed findings and likely need for LHC. Heparin gtt started in the ER and transfer arranged to [facility] given cath lab ability when needed (likely Monday unless symptoms or clinical picture changes).     Past Medical History  Diagnosis Date  . Diabetes mellitus     type II  . Osteoporosis   . Hyperlipidemia   . Hypertension   . Hypercalcemia   . CAD (coronary artery disease)   . Zoster     past  . Diverticulosis   . Hx of adenomatous colonic polyps 2010    last colonscopy 2010,Dr Vira Agar - no need for further  colonoscopy given age  . Myocardial infarct     1998    Past Surgical History  Procedure Laterality Date  . Abdominal hysterectomy      ovaries intact  . Shoulder surgery      right    Family History  Problem Relation Age of Onset  . Heart disease Mother     CAD  . Heart disease Father     CAD  . Heart disease Brother     CAD    Social History:  reports that she has quit smoking. She does not have any smokeless tobacco history on file. She reports that she does not drink alcohol or use illicit drugs.  Allergies:  Allergies  Allergen Reactions  . Alendronate Sodium   . Isosorbide   . Metformin And Related     Decreases appetite resulting in weight loss  . Metronidazole     REACTION: sick/ GI  . Nifedipine      (Not in a hospital admission)  Results for orders placed during the hospital encounter of 07/18/14 (from the past 48 hour(s))  CBC     Status: Abnormal   Collection Time    07/18/14  6:20 PM      Result Value Ref Range   WBC 19.1 (*) 4.0 - 10.5 K/uL   RBC 3.88  3.87 - 5.11 MIL/uL   Hemoglobin 11.3 (*) 12.0 - 15.0 g/dL   HCT 34.4 (*) 36.0 - 46.0 %   MCV 88.7  78.0 - 100.0 fL   MCH 29.1  26.0 - 34.0 pg   MCHC 32.8  30.0 - 36.0 g/dL   RDW 13.3  11.5 - 15.5 %   Platelets 453 (*) 150 - 400 K/uL  BASIC METABOLIC PANEL     Status:  Abnormal   Collection Time    07/18/14  6:20 PM      Result Value Ref Range   Sodium 141  137 - 147 mEq/L   Potassium 3.9  3.7 - 5.3 mEq/L   Chloride 103  96 - 112 mEq/L   CO2 15 (*) 19 - 32 mEq/L   Glucose, Bld 366 (*) 70 - 99 mg/dL   BUN 18  6 - 23 mg/dL   Creatinine, Ser 0.78  0.50 - 1.10 mg/dL   Calcium 8.4  8.4 - 10.5 mg/dL   GFR calc non Af Amer 74 (*) >90 mL/min   GFR calc Af Amer 86 (*) >90 mL/min   Comment: (NOTE)     The eGFR has been calculated using the CKD EPI equation.     This calculation has not been validated in all clinical situations.     eGFR's persistently <90 mL/min signify possible Chronic Kidney     Disease.   Anion gap 23 (*) 5 - 15  I-STAT TROPOININ, ED     Status: Abnormal   Collection Time    07/18/14  6:23 PM      Result Value Ref Range   Troponin i, poc 4.37 (*) 0.00 - 0.08 ng/mL   Comment 3            Comment: Due to the release kinetics of cTnI,     a negative result within the first hours     of the onset of symptoms does  not rule out     myocardial infarction with certainty.     If myocardial infarction is still suspected,     repeat the test at appropriate intervals.  CBG MONITORING, ED     Status: Abnormal   Collection Time    07/18/14  9:13 PM      Result Value Ref Range   Glucose-Capillary 304 (*) 70 - 99 mg/dL   Comment 1 Notify RN     Comment 2 Documented in Chart     Dg Chest Port 1 View  07/18/2014   CLINICAL DATA:  Nausea vomiting and decreased oxygen saturation. Former smoker.  EXAM: PORTABLE CHEST - 1 VIEW  COMPARISON:  04/04/2014.  FINDINGS: Mild cardiac enlargement. Atherosclerotic disease is noted within the aortic arch. There are 2 nodular densities in the left base that are indeterminate. No pleural effusion identified. Right lung is clear.  IMPRESSION: 1. Two nodular opacities are identified in the left base. In this patient who has a history of smoking a initial further workup with a dedicated PA and lateral upright radiographs are recommended.   Electronically Signed   By: Kerby Moors M.D.   On: 07/18/2014 19:25   Ct Angio Chest Aorta W/cm &/or Wo/cm  07/18/2014   CLINICAL DATA:  Nausea and vomiting.  Intermittent upper back pain.  EXAM: CT ANGIOGRAPHY CHEST, ABDOMEN AND PELVIS  TECHNIQUE: Multidetector CT imaging through the chest, abdomen and pelvis was performed using the standard protocol during bolus administration of intravenous contrast. Multiplanar reconstructed images and MIPs were obtained and reviewed to evaluate the vascular anatomy.  CONTRAST:  149m OMNIPAQUE IOHEXOL 350 MG/ML SOLN  COMPARISON:  None.  FINDINGS: CTA CHEST FINDINGS  Mediastinum: The heart size is normal. There is a small to moderate pericardial effusion identified. There is calcified atherosclerotic disease involving the thoracic aorta. No evidence for 8 dissection. The main pulmonary artery appears patent there is no abnormal filling defects within the main pulmonary artery or its  branches.  Lungs/Pleura: There  is atelectasis/ scarring identified within the lung bases. A calcified granuloma is noted in the left base. No airspace consolidation.  Musculoskeletal: There is mild spondylosis identified within the thoracic spine.  Review of the MIP images confirms the above findings.  CTA ABDOMEN AND PELVIS FINDINGS  Hepatobiliary: Normal appearance of the liver. There is diffuse gallbladder wall thickening measuring up to 8 mm. There is no biliary dilatation.  Pancreas: Normal appearance of the pancreas.  Spleen: The spleen is unremarkable.  Adrenals/Urinary Tract: The adrenal glands are both normal. There is mild bilateral renal cortical thinning. The urinary bladder is within normal limits.  Stomach/Bowel: Small hiatal hernia. The stomach is otherwise unremarkable. The small bowel loops have a normal course and caliber without obstruction. The appendix is visualized and appears normal. Multiple distal colonic diverticula identified without acute inflammation.  Vascular/Lymphatic: There is calcified atherosclerotic disease involving the normal caliber abdominal aorta. There is no dissection. No retroperitoneal adenopathy.  Reproductive: Previous hysterectomy. The adnexal structures appear normal.  Other: There is no free fluid or fluid collections within the abdomen or pelvis.  Musculoskeletal: Review of the visualized osseous structures is significant for degenerative disc disease within the L4-5 level.  Review of the MIP images confirms the above findings.  IMPRESSION: 1. No evidence for aortic dissection. 2. Atherosclerotic disease. 3. Gallbladder wall thickening. If there are clinical signs or symptoms of cholecystitis consider further investigation with abdominal sonogram. 4. Hiatal hernia 5. Thoracic and lumbar degenerative disc disease.   Electronically Signed   By: Kerby Moors M.D.   On: 07/18/2014 20:35   Ct Cta Abd/pel W/cm &/or W/o Cm  07/18/2014   CLINICAL DATA:  Nausea and vomiting.  Intermittent upper back  pain.  EXAM: CT ANGIOGRAPHY CHEST, ABDOMEN AND PELVIS  TECHNIQUE: Multidetector CT imaging through the chest, abdomen and pelvis was performed using the standard protocol during bolus administration of intravenous contrast. Multiplanar reconstructed images and MIPs were obtained and reviewed to evaluate the vascular anatomy.  CONTRAST:  16m OMNIPAQUE IOHEXOL 350 MG/ML SOLN  COMPARISON:  None.  FINDINGS: CTA CHEST FINDINGS  Mediastinum: The heart size is normal. There is a small to moderate pericardial effusion identified. There is calcified atherosclerotic disease involving the thoracic aorta. No evidence for 8 dissection. The main pulmonary artery appears patent there is no abnormal filling defects within the main pulmonary artery or its branches.  Lungs/Pleura: There is atelectasis/ scarring identified within the lung bases. A calcified granuloma is noted in the left base. No airspace consolidation.  Musculoskeletal: There is mild spondylosis identified within the thoracic spine.  Review of the MIP images confirms the above findings.  CTA ABDOMEN AND PELVIS FINDINGS  Hepatobiliary: Normal appearance of the liver. There is diffuse gallbladder wall thickening measuring up to 8 mm. There is no biliary dilatation.  Pancreas: Normal appearance of the pancreas.  Spleen: The spleen is unremarkable.  Adrenals/Urinary Tract: The adrenal glands are both normal. There is mild bilateral renal cortical thinning. The urinary bladder is within normal limits.  Stomach/Bowel: Small hiatal hernia. The stomach is otherwise unremarkable. The small bowel loops have a normal course and caliber without obstruction. The appendix is visualized and appears normal. Multiple distal colonic diverticula identified without acute inflammation.  Vascular/Lymphatic: There is calcified atherosclerotic disease involving the normal caliber abdominal aorta. There is no dissection. No retroperitoneal adenopathy.  Reproductive: Previous hysterectomy.  The adnexal structures appear normal.  Other: There is no free fluid or fluid collections  within the abdomen or pelvis.  Musculoskeletal: Review of the visualized osseous structures is significant for degenerative disc disease within the L4-5 level.  Review of the MIP images confirms the above findings.  IMPRESSION: 1. No evidence for aortic dissection. 2. Atherosclerotic disease. 3. Gallbladder wall thickening. If there are clinical signs or symptoms of cholecystitis consider further investigation with abdominal sonogram. 4. Hiatal hernia 5. Thoracic and lumbar degenerative disc disease.   Electronically Signed   By: Kerby Moors M.D.   On: 07/18/2014 20:35    Review of Systems  Constitutional: Positive for malaise/fatigue and diaphoresis. Negative for fever and chills.  HENT: Negative for nosebleeds.   Eyes: Negative for double vision and discharge.  Respiratory: Positive for shortness of breath. Negative for cough and hemoptysis.   Cardiovascular: Positive for chest pain. Negative for palpitations and orthopnea.  Gastrointestinal: Positive for nausea and vomiting. Negative for diarrhea, constipation and blood in stool.  Genitourinary: Negative for dysuria and hematuria.  Musculoskeletal: Positive for back pain and neck pain. Negative for falls.  Skin: Negative for rash.  Neurological: Negative for tingling, tremors, sensory change and headaches.  Endo/Heme/Allergies: Negative for polydipsia.  Psychiatric/Behavioral: Negative for depression, suicidal ideas and substance abuse.    Blood pressure 98/52, pulse 104, temperature 98.1 F (36.7 C), temperature source Rectal, resp. rate 18, SpO2 100.00%. Physical Exam  Nursing note and vitals reviewed. Constitutional: She is oriented to person, place, and time. She appears well-developed and well-nourished. No distress.  HENT:  Head: Normocephalic and atraumatic.  Nose: Nose normal.  Mouth/Throat: Oropharynx is clear and moist. No oropharyngeal  exudate.  Eyes: Conjunctivae and EOM are normal. Pupils are equal, round, and reactive to light. No scleral icterus.  Neck: Normal range of motion. Neck supple. No JVD present. No tracheal deviation present.  Cardiovascular: Regular rhythm, normal heart sounds and intact distal pulses.  Exam reveals no gallop.   No murmur heard. Sinus tachycardia  Respiratory: Effort normal and breath sounds normal. No respiratory distress. She has no wheezes. She has no rales.  GI: Soft. Bowel sounds are normal. She exhibits no distension. There is no tenderness. There is no rebound.  Musculoskeletal: Normal range of motion. She exhibits no edema and no tenderness.  Neurological: She is alert and oriented to person, place, and time. No cranial nerve deficit. Coordination normal.  Skin: Skin is warm. No rash noted. She is diaphoretic. No erythema.  Psychiatric: She has a normal mood and affect. Her behavior is normal. Thought content normal.   labs reviewed; Troponin 4.4, Cr 0.8, glucose 366, h/h 11.3/34.4, plt 453 ECG: sinus tach, diffuse ST depression CTA negative for dissection  Assessment/Plan Ms. Krzywicki 80 yo woman with PMH of T2DM, Hypertension, Dyslipidemia, family history in mother and father with CAD/MI in their 66s, CAD wiith anterior MI treated with TPA in 1989 and subsequent angioplasty with EF of 38% at that time and 95% at [facility]. She's had a subsequent myoview in '04 revealing no ischemia and EF of 58%. She presents tonight with upper back pain characterized as sharp with some radiation to her shoulders leading to presentation. Problem List 1. Back Pain/NSTEMI 2. Coronary Artery Disease 3. Borderline hypotension 4. Dyslipidemia 5. History of Hypertension 6. T2DM with hyperglycemia Given symptoms and elevated troponin with ECG findings, will treat with aspirin, heparin gtt, and likely pursue LHC on Monday unless symptoms progress during the interim. Discussed findings with Bianca Mueller and her  daughters regarding medical therapy for NSTEMI and pursue LHC likely  on Monday. - Update echocardiogram in AM - high dose statin, BB written for with holding parameters, no ace- given soft bp - lipid panel, Mg, BNP, hba1c, tsh - insulin SS - holding sitagliptan and glipizide - holding amlodipine, hctz, lisinopril, labetalol - also holding clonidine - 0.1 mg tid but she may rebound so may need to dose tomorrow   Bianca Mueller 07/18/2014, 10:17 PM

## 2014-07-18 NOTE — ED Notes (Signed)
Patient returned from CT and complaining of nausea-Dr. Winfred Leeds in to re-evaluate patient and order noted for Zofran.  Patient denies any pain/only complaining of nausea.

## 2014-07-18 NOTE — ED Notes (Signed)
Pt. I-stat troponin results were shown to Dr. Dione Housekeeper

## 2014-07-18 NOTE — ED Notes (Signed)
rn attempted to draw blood off IV, unable, rn started second line, unable to draw blood off 2nd IV. Butterfly needle attempted in left hand, still unable to draw blood. Charge nurse attempting

## 2014-07-19 DIAGNOSIS — I214 Non-ST elevation (NSTEMI) myocardial infarction: Principal | ICD-10-CM

## 2014-07-19 DIAGNOSIS — I369 Nonrheumatic tricuspid valve disorder, unspecified: Secondary | ICD-10-CM

## 2014-07-19 LAB — TROPONIN I: TROPONIN I: 2.73 ng/mL — AB (ref <0.30)

## 2014-07-19 LAB — HEMOGLOBIN A1C
Hgb A1c MFr Bld: 7.1 % — ABNORMAL HIGH (ref <5.7)
MEAN PLASMA GLUCOSE: 157 mg/dL — AB (ref <117)

## 2014-07-19 LAB — BASIC METABOLIC PANEL
Anion gap: 19 — ABNORMAL HIGH (ref 5–15)
BUN: 25 mg/dL — AB (ref 6–23)
CO2: 18 mEq/L — ABNORMAL LOW (ref 19–32)
CREATININE: 1.11 mg/dL — AB (ref 0.50–1.10)
Calcium: 8.6 mg/dL (ref 8.4–10.5)
Chloride: 103 mEq/L (ref 96–112)
GFR, EST AFRICAN AMERICAN: 51 mL/min — AB (ref >90)
GFR, EST NON AFRICAN AMERICAN: 44 mL/min — AB (ref >90)
Glucose, Bld: 197 mg/dL — ABNORMAL HIGH (ref 70–99)
Potassium: 4 mEq/L (ref 3.7–5.3)
Sodium: 140 mEq/L (ref 137–147)

## 2014-07-19 LAB — GLUCOSE, CAPILLARY
GLUCOSE-CAPILLARY: 114 mg/dL — AB (ref 70–99)
Glucose-Capillary: 188 mg/dL — ABNORMAL HIGH (ref 70–99)
Glucose-Capillary: 200 mg/dL — ABNORMAL HIGH (ref 70–99)
Glucose-Capillary: 140 mg/dL — ABNORMAL HIGH (ref 70–99)

## 2014-07-19 LAB — URINE MICROSCOPIC-ADD ON

## 2014-07-19 LAB — CBC
HCT: 36.7 % (ref 36.0–46.0)
Hemoglobin: 12.2 g/dL (ref 12.0–15.0)
MCH: 29.4 pg (ref 26.0–34.0)
MCHC: 33.2 g/dL (ref 30.0–36.0)
MCV: 88.4 fL (ref 78.0–100.0)
PLATELETS: 326 10*3/uL (ref 150–400)
RBC: 4.15 MIL/uL (ref 3.87–5.11)
RDW: 13.9 % (ref 11.5–15.5)
WBC: 13.9 10*3/uL — ABNORMAL HIGH (ref 4.0–10.5)

## 2014-07-19 LAB — HEPARIN LEVEL (UNFRACTIONATED)
HEPARIN UNFRACTIONATED: 0.26 [IU]/mL — AB (ref 0.30–0.70)
Heparin Unfractionated: 0.34 IU/mL (ref 0.30–0.70)

## 2014-07-19 LAB — URINALYSIS, ROUTINE W REFLEX MICROSCOPIC
BILIRUBIN URINE: NEGATIVE
Glucose, UA: NEGATIVE mg/dL
Hgb urine dipstick: NEGATIVE
Ketones, ur: NEGATIVE mg/dL
LEUKOCYTES UA: NEGATIVE
Nitrite: NEGATIVE
PROTEIN: 30 mg/dL — AB
Specific Gravity, Urine: >1.046 — ABNORMAL HIGH (ref 1.005–1.030)
Urobilinogen, UA: 1 mg/dL (ref 0.0–1.0)
pH: 5.5 (ref 5.0–8.0)

## 2014-07-19 LAB — LIPID PANEL
CHOLESTEROL: 84 mg/dL (ref 0–200)
HDL: 24 mg/dL — ABNORMAL LOW (ref >39)
LDL Cholesterol: 41 mg/dL (ref 0–99)
Total CHOL/HDL Ratio: 3.5 RATIO
Triglycerides: 94 mg/dL (ref <150)
VLDL: 19 mg/dL (ref 0–40)

## 2014-07-19 LAB — PROTIME-INR
INR: 1.68 — ABNORMAL HIGH (ref 0.00–1.49)
Prothrombin Time: 19.9 seconds — ABNORMAL HIGH (ref 11.6–15.2)

## 2014-07-19 LAB — MRSA PCR SCREENING: MRSA by PCR: NEGATIVE

## 2014-07-19 LAB — TSH: TSH: 2.08 u[IU]/mL (ref 0.350–4.500)

## 2014-07-19 MED ORDER — FENTANYL CITRATE 0.05 MG/ML IJ SOLN
INTRAMUSCULAR | Status: AC
  Filled 2014-07-19: qty 2

## 2014-07-19 MED ORDER — INSULIN ASPART 100 UNIT/ML ~~LOC~~ SOLN
0.0000 [IU] | Freq: Three times a day (TID) | SUBCUTANEOUS | Status: DC
  Administered 2014-07-19 (×2): 4 [IU] via SUBCUTANEOUS
  Administered 2014-07-19 – 2014-07-20 (×2): 2 [IU] via SUBCUTANEOUS
  Administered 2014-07-21: 6 [IU] via SUBCUTANEOUS
  Administered 2014-07-21 – 2014-07-22 (×2): 2 [IU] via SUBCUTANEOUS
  Administered 2014-07-22: 4 [IU] via SUBCUTANEOUS
  Administered 2014-07-23: 6 [IU] via SUBCUTANEOUS
  Administered 2014-07-23 (×2): 2 [IU] via SUBCUTANEOUS
  Administered 2014-07-24: 4 [IU] via SUBCUTANEOUS
  Administered 2014-07-24: 2 [IU] via SUBCUTANEOUS
  Administered 2014-07-24 – 2014-07-25 (×2): 6 [IU] via SUBCUTANEOUS
  Administered 2014-07-25 (×2): 4 [IU] via SUBCUTANEOUS
  Administered 2014-07-26: 2 [IU] via SUBCUTANEOUS
  Administered 2014-07-26: 8 [IU] via SUBCUTANEOUS
  Administered 2014-07-27: 4 [IU] via SUBCUTANEOUS
  Administered 2014-07-27: 8 [IU] via SUBCUTANEOUS
  Administered 2014-07-27: 2 [IU] via SUBCUTANEOUS
  Administered 2014-07-28: 6 [IU] via SUBCUTANEOUS
  Administered 2014-07-28 – 2014-07-29 (×2): 4 [IU] via SUBCUTANEOUS

## 2014-07-19 MED ORDER — LORAZEPAM 2 MG/ML IJ SOLN
INTRAMUSCULAR | Status: AC
Start: 2014-07-19 — End: 2014-07-19
  Filled 2014-07-19: qty 1

## 2014-07-19 MED ORDER — CETYLPYRIDINIUM CHLORIDE 0.05 % MT LIQD
7.0000 mL | Freq: Two times a day (BID) | OROMUCOSAL | Status: DC
  Administered 2014-07-19 – 2014-07-29 (×15): 7 mL via OROMUCOSAL

## 2014-07-19 MED ORDER — SODIUM CHLORIDE 0.9 % IV BOLUS (SEPSIS): 500.0000 mL | Freq: Once | INTRAVENOUS | Status: DC

## 2014-07-19 MED ORDER — METOPROLOL TARTRATE 1 MG/ML IV SOLN
5.0000 mg | Freq: Once | INTRAVENOUS | Status: AC
  Administered 2014-07-19: 5 mg via INTRAVENOUS
  Filled 2014-07-19 (×2): qty 5

## 2014-07-19 NOTE — Progress Notes (Addendum)
ANTICOAGULATION CONSULT NOTE - Follow Up Consult  Pharmacy Consult for Heparin  Indication: chest pain/ACS  Allergies  Allergen Reactions  . Alendronate Sodium   . Isosorbide   . Metformin And Related     Decreases appetite resulting in weight loss  . Metronidazole     REACTION: sick/ GI  . Nifedipine     Patient Measurements: Height: 5\' 2"  (157.5 cm) Weight: 114 lb 10.2 oz (52 kg) IBW/kg (Calculated) : 50.1 Heparin Dosing Weight: 51 kg   Vital Signs: Temp: 98 F (36.7 C) (11/01 1200) Temp Source: Oral (11/01 1200) BP: 138/77 mmHg (11/01 1100) Pulse Rate: 94 (11/01 1014)  Labs:  Recent Labs  07/18/14 1820 07/18/14 2317 07/19/14 0937  HGB 11.3*  --  12.2  HCT 34.4*  --  36.7  PLT 453*  --  326  LABPROT  --   --  19.9*  INR  --   --  1.68*  HEPARINUNFRC  --   --  0.26*  CREATININE 0.78  --  1.11*  TROPONINI  --  3.29* 2.73*    Estimated Creatinine Clearance: 29.3 mL/min (by C-G formula based on Cr of 1.11).   Medical History: Past Medical History  Diagnosis Date  . Diabetes mellitus     type II  . Osteoporosis   . Hyperlipidemia   . Hypertension   . Hypercalcemia   . CAD (coronary artery disease)   . Zoster     past  . Diverticulosis   . Hx of adenomatous colonic polyps 2010    last colonscopy 2010,Dr Vira Agar - no need for further  colonoscopy given age  . Myocardial infarct     1998    Medications:  Prescriptions prior to admission  Medication Sig Dispense Refill Last Dose  . amLODipine (NORVASC) 5 MG tablet Take 5 mg by mouth at bedtime.   07/17/2014 at Unknown time  . aspirin EC 325 MG tablet Take 325 mg by mouth daily.   07/18/2014 at Unknown time  . cloNIDine (CATAPRES) 0.2 MG tablet 1/2 tablet by mouth every AM, 1/2 tablet by mouth at lunch and 1 tablet by mouth every PM. 180 tablet 3 07/18/2014 at Unknown time  . esomeprazole (NEXIUM) 40 MG capsule Take 40 mg by mouth daily at 12 noon.   07/18/2014 at Unknown time  . glipiZIDE (GLUCOTROL  XL) 10 MG 24 hr tablet Take 10 mg by mouth daily with breakfast.   07/18/2014 at Unknown time  . labetalol (NORMODYNE) 100 MG tablet Take 100 mg by mouth daily.   07/18/2014 at Unknown time  . lisinopril-hydrochlorothiazide (ZESTORETIC) 20-12.5 MG per tablet Take 1 tablet by mouth daily. 90 tablet 3 07/18/2014 at Unknown time  . simvastatin (ZOCOR) 20 MG tablet Take 20 mg by mouth daily at 6 PM.   07/17/2014 at Unknown time  . sitaGLIPtin (JANUVIA) 100 MG tablet Take 100 mg by mouth daily.   07/18/2014 at Unknown time  . doxycycline (VIBRA-TABS) 100 MG tablet Take 1 tablet (100 mg total) by mouth 2 (two) times daily. 14 tablet 0 07/15/2014  . glucose blood (ONE TOUCH ULTRA TEST) test strip USE ONE STRIP TO CHECK BLOOD SUGAR ONCE DAILY AND PRN FOR DM 250.0 100 each 3 Taking  . nitroGLYCERIN (NITROSTAT) 0.4 MG SL tablet Place 1 tablet (0.4 mg total) under the tongue every 5 (five) minutes as needed. For chest pain and if not improved seek medical attention in ER. 15 tablet 0 unknown  . ONETOUCH DELICA LANCETS  33G MISC Use as instructed to check blood sugar once daily or as needed.  250.00 100 each 3 Taking    Assessment: 57 YOF with complains of back pain with mild shortness of breath which has since resolved. Initial troponin elevated at 4.37. Pharmacy consulted to start heparin infusion for ACS/NSTEMI. Hgb 11.3, Plt 453. Pt is not on any anticoagulants prior to admission. Heparin drip rate 600 uts/hr HL 0.26, CBC stable, no bleeding noted.    Goal of Therapy:  Heparin level 0.3-0.7 units/ml Monitor platelets by anticoagulation protocol: Yes   Plan:  Increase heparin drip 800  Uts/hr  HL in 6hr Daily HL, CBC  Bonnita Nasuti Pharm.D. CPP, BCPS Clinical Pharmacist [phone] 07/19/2014 1:15 PM   Addendum: Follow up heparin level is now therapeutic at 0.34 after rate increase. Continue heparin at 800 units/hr and follow up AM labs.  Nena Jordan, PharmD, BCPS 07/19/2014, 8:16 PM

## 2014-07-19 NOTE — ED Notes (Addendum)
Critical troponin 3.29 reported to Dr. Winfred Leeds and Primary RN, Leone Haven

## 2014-07-19 NOTE — Progress Notes (Signed)
Per pt's family member, pt was instructed to take 7 days of antibiotic for sinus infection. Pt only able to complete 4 days prior to coming into hospital.  MD aware.

## 2014-07-19 NOTE — Progress Notes (Signed)
Dr. Claiborne Billings notified of pt's low BP 87/53 (61). 558mL bolus ordered. Will continue to monitor.

## 2014-07-19 NOTE — Progress Notes (Signed)
  Echocardiogram 2D Echocardiogram has been performed.  Virlee Stroschein FRANCES 07/19/2014, 11:55 AM

## 2014-07-19 NOTE — Progress Notes (Signed)
Patient Name: Bianca Mueller Date of Encounter: 07/19/2014     Principal Problem:   NSTEMI (non-ST elevated myocardial infarction) Active Problems:   Essential hypertension   DM2 (diabetes mellitus, type 2)   Dyslipidemia    SUBJECTIVE  The patient denies any interscapular pain or chest pain today.  Rhythm is stable normal sinus rhythm.  She denies any dyspnea.  Echocardiogram is pending.  EKG is reviewed from admission showed no ischemic changes.  Follow-up EKG today is pending.  CURRENT MEDS . antiseptic oral rinse  7 mL Mouth Rinse BID  . aspirin EC  81 mg Oral Daily  . atorvastatin  80 mg Oral q1800  . fentaNYL      . insulin aspart  0-24 Units Subcutaneous TID WC  . LORazepam      . metoprolol  5 mg Intravenous Once  . metoprolol tartrate  12.5 mg Oral BID  . pantoprazole  40 mg Oral Daily  . sodium chloride  500 mL Intravenous Once    OBJECTIVE  Filed Vitals:   07/19/14 0500 07/19/14 0600 07/19/14 0700 07/19/14 0726  BP: 95/65 110/69 157/76   Pulse: 77 82 88 93  Temp:    98.3 F (36.8 C)  TempSrc:    Oral  Resp:      Height:      Weight:      SpO2: 99% 100% 100% 100%    Intake/Output Summary (Last 24 hours) at 07/19/14 0819 Last data filed at 07/19/14 0700  Gross per 24 hour  Intake    550 ml  Output     50 ml  Net    500 ml   Filed Weights   07/19/14 0225  Weight: 114 lb 10.2 oz (52 kg)    PHYSICAL EXAM  General: Pleasant, NAD. Neuro: Alert and oriented X 3. Moves all extremities spontaneously. Psych: Normal affect. HEENT:  Normal  Neck: Supple without bruits or JVD. Lungs:  Resp regular and unlabored, CTA. Heart: RRR no s3, s4, or murmurs. Abdomen: Soft, non-tender, non-distended, BS + x 4.  Extremities: No clubbing, cyanosis or edema. DP/PT/Radials 2+ and equal bilaterally.  Accessory Clinical Findings  CBC  Recent Labs  07/18/14 1820  WBC 19.1*  HGB 11.3*  HCT 34.4*  MCV 88.7  PLT 741*   Basic Metabolic Panel  Recent  Labs  07/18/14 1820 07/18/14 2317  NA 141  --   K 3.9  --   CL 103  --   CO2 15*  --   GLUCOSE 366*  --   BUN 18  --   CREATININE 0.78  --   CALCIUM 8.4  --   MG  --  2.2   Liver Function Tests No results for input(s): AST, ALT, ALKPHOS, BILITOT, PROT, ALBUMIN in the last 72 hours. No results for input(s): LIPASE, AMYLASE in the last 72 hours. Cardiac Enzymes  Recent Labs  07/18/14 2317  TROPONINI 3.29*   BNP Invalid input(s): POCBNP D-Dimer No results for input(s): DDIMER in the last 72 hours. Hemoglobin A1C No results for input(s): HGBA1C in the last 72 hours. Fasting Lipid Panel No results for input(s): CHOL, HDL, LDLCALC, TRIG, CHOLHDL, LDLDIRECT in the last 72 hours. Thyroid Function Tests  Recent Labs  07/18/14 2317  TSH 2.080    TELE  Normal sinus rhythm  ECG  Repeat pending  Radiology/Studies  Dg Chest Port 1 View  07/18/2014   CLINICAL DATA:  Nausea vomiting and decreased oxygen saturation. Former smoker.  EXAM: PORTABLE CHEST - 1 VIEW  COMPARISON:  04/04/2014.  FINDINGS: Mild cardiac enlargement. Atherosclerotic disease is noted within the aortic arch. There are 2 nodular densities in the left base that are indeterminate. No pleural effusion identified. Right lung is clear.  IMPRESSION: 1. Two nodular opacities are identified in the left base. In this patient who has a history of smoking a initial further workup with a dedicated PA and lateral upright radiographs are recommended.   Electronically Signed   By: Kerby Moors M.D.   On: 07/18/2014 19:25   Ct Angio Chest Aorta W/cm &/or Wo/cm  07/18/2014   CLINICAL DATA:  Nausea and vomiting.  Intermittent upper back pain.  EXAM: CT ANGIOGRAPHY CHEST, ABDOMEN AND PELVIS  TECHNIQUE: Multidetector CT imaging through the chest, abdomen and pelvis was performed using the standard protocol during bolus administration of intravenous contrast. Multiplanar reconstructed images and MIPs were obtained and reviewed  to evaluate the vascular anatomy.  CONTRAST:  157mL OMNIPAQUE IOHEXOL 350 MG/ML SOLN  COMPARISON:  None.  FINDINGS: CTA CHEST FINDINGS  Mediastinum: The heart size is normal. There is a small to moderate pericardial effusion identified. There is calcified atherosclerotic disease involving the thoracic aorta. No evidence for 8 dissection. The main pulmonary artery appears patent there is no abnormal filling defects within the main pulmonary artery or its branches.  Lungs/Pleura: There is atelectasis/ scarring identified within the lung bases. A calcified granuloma is noted in the left base. No airspace consolidation.  Musculoskeletal: There is mild spondylosis identified within the thoracic spine.  Review of the MIP images confirms the above findings.  CTA ABDOMEN AND PELVIS FINDINGS  Hepatobiliary: Normal appearance of the liver. There is diffuse gallbladder wall thickening measuring up to 8 mm. There is no biliary dilatation.  Pancreas: Normal appearance of the pancreas.  Spleen: The spleen is unremarkable.  Adrenals/Urinary Tract: The adrenal glands are both normal. There is mild bilateral renal cortical thinning. The urinary bladder is within normal limits.  Stomach/Bowel: Small hiatal hernia. The stomach is otherwise unremarkable. The small bowel loops have a normal course and caliber without obstruction. The appendix is visualized and appears normal. Multiple distal colonic diverticula identified without acute inflammation.  Vascular/Lymphatic: There is calcified atherosclerotic disease involving the normal caliber abdominal aorta. There is no dissection. No retroperitoneal adenopathy.  Reproductive: Previous hysterectomy. The adnexal structures appear normal.  Other: There is no free fluid or fluid collections within the abdomen or pelvis.  Musculoskeletal: Review of the visualized osseous structures is significant for degenerative disc disease within the L4-5 level.  Review of the MIP images confirms the  above findings.  IMPRESSION: 1. No evidence for aortic dissection. 2. Atherosclerotic disease. 3. Gallbladder wall thickening. If there are clinical signs or symptoms of cholecystitis consider further investigation with abdominal sonogram. 4. Hiatal hernia 5. Thoracic and lumbar degenerative disc disease.   Electronically Signed   By: Kerby Moors M.D.   On: 07/18/2014 20:35   Ct Cta Abd/pel W/cm &/or W/o Cm  07/18/2014   CLINICAL DATA:  Nausea and vomiting.  Intermittent upper back pain.  EXAM: CT ANGIOGRAPHY CHEST, ABDOMEN AND PELVIS  TECHNIQUE: Multidetector CT imaging through the chest, abdomen and pelvis was performed using the standard protocol during bolus administration of intravenous contrast. Multiplanar reconstructed images and MIPs were obtained and reviewed to evaluate the vascular anatomy.  CONTRAST:  128mL OMNIPAQUE IOHEXOL 350 MG/ML SOLN  COMPARISON:  None.  FINDINGS: CTA CHEST FINDINGS  Mediastinum: The heart size is  normal. There is a small to moderate pericardial effusion identified. There is calcified atherosclerotic disease involving the thoracic aorta. No evidence for 8 dissection. The main pulmonary artery appears patent there is no abnormal filling defects within the main pulmonary artery or its branches.  Lungs/Pleura: There is atelectasis/ scarring identified within the lung bases. A calcified granuloma is noted in the left base. No airspace consolidation.  Musculoskeletal: There is mild spondylosis identified within the thoracic spine.  Review of the MIP images confirms the above findings.  CTA ABDOMEN AND PELVIS FINDINGS  Hepatobiliary: Normal appearance of the liver. There is diffuse gallbladder wall thickening measuring up to 8 mm. There is no biliary dilatation.  Pancreas: Normal appearance of the pancreas.  Spleen: The spleen is unremarkable.  Adrenals/Urinary Tract: The adrenal glands are both normal. There is mild bilateral renal cortical thinning. The urinary bladder is  within normal limits.  Stomach/Bowel: Small hiatal hernia. The stomach is otherwise unremarkable. The small bowel loops have a normal course and caliber without obstruction. The appendix is visualized and appears normal. Multiple distal colonic diverticula identified without acute inflammation.  Vascular/Lymphatic: There is calcified atherosclerotic disease involving the normal caliber abdominal aorta. There is no dissection. No retroperitoneal adenopathy.  Reproductive: Previous hysterectomy. The adnexal structures appear normal.  Other: There is no free fluid or fluid collections within the abdomen or pelvis.  Musculoskeletal: Review of the visualized osseous structures is significant for degenerative disc disease within the L4-5 level.  Review of the MIP images confirms the above findings.  IMPRESSION: 1. No evidence for aortic dissection. 2. Atherosclerotic disease. 3. Gallbladder wall thickening. If there are clinical signs or symptoms of cholecystitis consider further investigation with abdominal sonogram. 4. Hiatal hernia 5. Thoracic and lumbar degenerative disc disease.   Electronically Signed   By: Kerby Moors M.D.   On: 07/18/2014 20:35    ASSESSMENT AND PLAN 1. Back Pain/NSTEMI--continue heparin, aspirin, beta blocker, high-dose statin 2. Coronary Artery Disease 3. Borderline hypotension,improved 4. Dyslipidemia 5. History of Hypertension 6. T2DM with hyperglycemia--on sliding scale 7. Leukocytosis history of recent sinus infection  Plan: 2-D echo today.  Plan for left heart cardiac catheterization Monday. Follow-up CBC and obtain urinalysis Obtain follow-up EKG.  Cycle enzymes  Signed, Darlin Coco MD

## 2014-07-19 NOTE — Plan of Care (Signed)
Problem: Consults Goal: Tobacco Cessation referral if indicated Outcome: Not Applicable Date Met:  33/35/45 Goal: Nutrition Consult-if indicated Outcome: Not Applicable Date Met:  62/56/38 Goal: Diabetes Guidelines if Diabetic/Glucose > 140 If diabetic or lab glucose is > 140 mg/dl - Initiate Diabetes/Hyperglycemia Guidelines & Document Interventions  Outcome: Completed/Met Date Met:  07/19/14  Problem: Phase I Progression Outcomes Goal: Aspirin unless contraindicated Outcome: Completed/Met Date Met:  07/19/14 Goal: MD aware of Cardiac Marker results Outcome: Completed/Met Date Met:  07/19/14 Goal: Voiding-avoid urinary catheter unless indicated Outcome: Completed/Met Date Met:  07/19/14 Goal: Other Phase I Outcomes/Goals Outcome: Not Applicable Date Met:  93/73/42

## 2014-07-20 ENCOUNTER — Inpatient Hospital Stay (HOSPITAL_COMMUNITY): Payer: Medicare HMO

## 2014-07-20 DIAGNOSIS — I4892 Unspecified atrial flutter: Secondary | ICD-10-CM

## 2014-07-20 DIAGNOSIS — R7401 Elevation of levels of liver transaminase levels: Secondary | ICD-10-CM | POA: Clinically undetermined

## 2014-07-20 DIAGNOSIS — R740 Nonspecific elevation of levels of transaminase and lactic acid dehydrogenase [LDH]: Secondary | ICD-10-CM

## 2014-07-20 DIAGNOSIS — R932 Abnormal findings on diagnostic imaging of liver and biliary tract: Secondary | ICD-10-CM

## 2014-07-20 DIAGNOSIS — R1013 Epigastric pain: Secondary | ICD-10-CM

## 2014-07-20 DIAGNOSIS — E785 Hyperlipidemia, unspecified: Secondary | ICD-10-CM

## 2014-07-20 DIAGNOSIS — I483 Typical atrial flutter: Secondary | ICD-10-CM

## 2014-07-20 DIAGNOSIS — J019 Acute sinusitis, unspecified: Secondary | ICD-10-CM

## 2014-07-20 DIAGNOSIS — J0110 Acute frontal sinusitis, unspecified: Secondary | ICD-10-CM

## 2014-07-20 DIAGNOSIS — I10 Essential (primary) hypertension: Secondary | ICD-10-CM

## 2014-07-20 LAB — GLUCOSE, CAPILLARY
GLUCOSE-CAPILLARY: 130 mg/dL — AB (ref 70–99)
GLUCOSE-CAPILLARY: 86 mg/dL (ref 70–99)
GLUCOSE-CAPILLARY: 107 mg/dL — AB (ref 70–99)
Glucose-Capillary: 106 mg/dL — ABNORMAL HIGH (ref 70–99)

## 2014-07-20 LAB — HEPARIN LEVEL (UNFRACTIONATED)
Heparin Unfractionated: 0.24 IU/mL — ABNORMAL LOW (ref 0.30–0.70)
Heparin Unfractionated: <0.1 IU/mL — ABNORMAL LOW (ref 0.30–0.70)

## 2014-07-20 LAB — CBC
HCT: 33.1 % — ABNORMAL LOW (ref 36.0–46.0)
HEMOGLOBIN: 10.7 g/dL — AB (ref 12.0–15.0)
MCH: 28.3 pg (ref 26.0–34.0)
MCHC: 32.3 g/dL (ref 30.0–36.0)
MCV: 87.6 fL (ref 78.0–100.0)
Platelets: 267 10*3/uL (ref 150–400)
RBC: 3.78 MIL/uL — ABNORMAL LOW (ref 3.87–5.11)
RDW: 14.2 % (ref 11.5–15.5)
WBC: 12.6 10*3/uL — AB (ref 4.0–10.5)

## 2014-07-20 LAB — HEPATIC FUNCTION PANEL
ALBUMIN: 2.6 g/dL — AB (ref 3.5–5.2)
ALT: 608 U/L — AB (ref 0–35)
AST: 727 U/L — ABNORMAL HIGH (ref 0–37)
Alkaline Phosphatase: 89 U/L (ref 39–117)
Bilirubin, Direct: <0.2 mg/dL (ref 0.0–0.3)
TOTAL PROTEIN: 6.1 g/dL (ref 6.0–8.3)
Total Bilirubin: 0.5 mg/dL (ref 0.3–1.2)

## 2014-07-20 LAB — PROTIME-INR
INR: 1.81 — ABNORMAL HIGH (ref 0.00–1.49)
INR: 1.78 — ABNORMAL HIGH (ref 0.00–1.49)
PROTHROMBIN TIME: 21.1 s — AB (ref 11.6–15.2)
Prothrombin Time: 20.9 seconds — ABNORMAL HIGH (ref 11.6–15.2)

## 2014-07-20 LAB — HEPATITIS PANEL, ACUTE
HCV AB: NEGATIVE
HEP B S AG: NEGATIVE
Hep A IgM: NONREACTIVE
Hep B C IgM: NONREACTIVE

## 2014-07-20 MED ORDER — PHYTONADIONE 5 MG PO TABS
2.5000 mg | ORAL_TABLET | Freq: Once | ORAL | Status: AC
  Administered 2014-07-20: 2.5 mg via ORAL
  Filled 2014-07-20: qty 1

## 2014-07-20 MED ORDER — DILTIAZEM HCL 100 MG IV SOLR
5.0000 mg/h | INTRAVENOUS | Status: DC
  Administered 2014-07-20: 10 mg/h via INTRAVENOUS
  Administered 2014-07-20: 5 mg/h via INTRAVENOUS
  Administered 2014-07-21 (×2): 10 mg/h via INTRAVENOUS
  Administered 2014-07-22 (×2): 15 mg/h via INTRAVENOUS
  Administered 2014-07-23: 10 mg/h via INTRAVENOUS
  Filled 2014-07-20: qty 100

## 2014-07-20 MED ORDER — SODIUM CHLORIDE 0.9 % IV SOLN
250.0000 mL | INTRAVENOUS | Status: DC | PRN
  Administered 2014-07-20: 500 mL via INTRAVENOUS

## 2014-07-20 MED ORDER — ASPIRIN 81 MG PO CHEW
81.0000 mg | CHEWABLE_TABLET | ORAL | Status: AC
  Administered 2014-07-20: 81 mg via ORAL
  Filled 2014-07-20: qty 1

## 2014-07-20 MED ORDER — SODIUM CHLORIDE 0.9 % IJ SOLN
3.0000 mL | Freq: Two times a day (BID) | INTRAMUSCULAR | Status: DC
  Administered 2014-07-20 – 2014-07-22 (×5): 3 mL via INTRAVENOUS

## 2014-07-20 MED ORDER — DOXYCYCLINE HYCLATE 100 MG PO TABS
100.0000 mg | ORAL_TABLET | Freq: Two times a day (BID) | ORAL | Status: DC
  Administered 2014-07-20 (×2): 100 mg via ORAL
  Filled 2014-07-20 (×4): qty 1

## 2014-07-20 MED ORDER — DILTIAZEM LOAD VIA INFUSION
5.0000 mg | Freq: Once | INTRAVENOUS | Status: AC
  Administered 2014-07-20: 5 mg via INTRAVENOUS
  Filled 2014-07-20: qty 5

## 2014-07-20 MED ORDER — SODIUM CHLORIDE 0.9 % IJ SOLN: 3.0000 mL | INTRAMUSCULAR | Status: DC | PRN

## 2014-07-20 MED ORDER — METOPROLOL TARTRATE 1 MG/ML IV SOLN
INTRAVENOUS | Status: AC
  Administered 2014-07-20: 5 mg
  Filled 2014-07-20: qty 5

## 2014-07-20 MED ORDER — METOPROLOL TARTRATE 1 MG/ML IV SOLN: 5.0000 mg | Freq: Once | INTRAVENOUS | Status: AC

## 2014-07-20 NOTE — Progress Notes (Signed)
Paged Dr. Elias Else. Patients heart rate remains in the 150's. Orders given to give IV lopressor and repeat in 10 minutes if results not significant. Gave first dose at midnight and had to repeat dose 10 minutes later. Patient remained stable and continued to complain of nausea.                                      Khloi Rawl RN

## 2014-07-20 NOTE — Progress Notes (Signed)
SUBJECTIVE:  Reports feeling nauseous today but improved from when she came in.  Still having some sinus tenderness but also improved.  She denies any current chest or back pain.  OBJECTIVE:   Vitals:   Filed Vitals:   07/20/14 0500 07/20/14 0600 07/20/14 0629 07/20/14 0700  BP: 111/58 126/87  103/55  Pulse: 129 133  94  Temp:    98.2 F (36.8 C)  TempSrc:    Oral  Resp:    20  Height:      Weight: 113 lb 5.1 oz (51.4 kg)  113 lb 5.1 oz (51.4 kg)   SpO2: 96% 93%  98%   I&O's:   Intake/Output Summary (Last 24 hours) at 07/20/14 9373 Last data filed at 07/20/14 0700  Gross per 24 hour  Intake 414.88 ml  Output    450 ml  Net -35.12 ml   TELEMETRY: Reviewed telemetry pt in atrial flutter:     PHYSICAL EXAM General: Well developed, well nourished, in no acute distress Head:   Normal cephalic and atraumatic, Maxillary sinus tenderness  Lungs:  Clear bilaterally to auscultation. Heart:  Tachycardic with rate ~140, No JVD.   Abdomen: abdomen soft and non-tender Msk:  Back normal,  Normal strength and tone for age. Extremities:  Trace edema.   Neuro: Alert and oriented. Psych:  Normal affect, responds appropriately   LABS: Basic Metabolic Panel:  Recent Labs  07/18/14 1820 07/18/14 2317 07/19/14 0937  NA 141  --  140  K 3.9  --  4.0  CL 103  --  103  CO2 15*  --  18*  GLUCOSE 366*  --  197*  BUN 18  --  25*  CREATININE 0.78  --  1.11*  CALCIUM 8.4  --  8.6  MG  --  2.2  --    Liver Function Tests:  Recent Labs  07/20/14 0228  AST 727*  ALT 608*  ALKPHOS 89  BILITOT 0.5  PROT 6.1  ALBUMIN 2.6*   No results for input(s): LIPASE, AMYLASE in the last 72 hours. CBC:  Recent Labs  07/19/14 0937 07/20/14 0228  WBC 13.9* 12.6*  HGB 12.2 10.7*  HCT 36.7 33.1*  MCV 88.4 87.6  PLT 326 267   Cardiac Enzymes:  Recent Labs  07/18/14 2317 07/19/14 0937  TROPONINI 3.29* 2.73*   BNP: Invalid input(s): POCBNP D-Dimer: No results for input(s):  DDIMER in the last 72 hours. Hemoglobin A1C:  Recent Labs  07/18/14 2317  HGBA1C 7.1*   Fasting Lipid Panel:  Recent Labs  07/19/14 0937  CHOL 84  HDL 24*  LDLCALC 41  TRIG 94  CHOLHDL 3.5   Thyroid Function Tests:  Recent Labs  07/18/14 2317  TSH 2.080   Anemia Panel: No results for input(s): VITAMINB12, FOLATE, FERRITIN, TIBC, IRON, RETICCTPCT in the last 72 hours. Coag Panel:   Lab Results  Component Value Date   INR 1.81* 07/20/2014   INR 1.68* 07/19/2014    RADIOLOGY: Dg Chest Port 1 View  07/18/2014   CLINICAL DATA:  Nausea vomiting and decreased oxygen saturation. Former smoker.  EXAM: PORTABLE CHEST - 1 VIEW  COMPARISON:  04/04/2014.  FINDINGS: Mild cardiac enlargement. Atherosclerotic disease is noted within the aortic arch. There are 2 nodular densities in the left base that are indeterminate. No pleural effusion identified. Right lung is clear.  IMPRESSION: 1. Two nodular opacities are identified in the left base. In this patient who has a history of smoking a  initial further workup with a dedicated PA and lateral upright radiographs are recommended.   Electronically Signed   By: Kerby Moors M.D.   On: 07/18/2014 19:25   Ct Angio Chest Aorta W/cm &/or Wo/cm  07/18/2014   CLINICAL DATA:  Nausea and vomiting.  Intermittent upper back pain.  EXAM: CT ANGIOGRAPHY CHEST, ABDOMEN AND PELVIS  TECHNIQUE: Multidetector CT imaging through the chest, abdomen and pelvis was performed using the standard protocol during bolus administration of intravenous contrast. Multiplanar reconstructed images and MIPs were obtained and reviewed to evaluate the vascular anatomy.  CONTRAST:  117m OMNIPAQUE IOHEXOL 350 MG/ML SOLN  COMPARISON:  None.  FINDINGS: CTA CHEST FINDINGS  Mediastinum: The heart size is normal. There is a small to moderate pericardial effusion identified. There is calcified atherosclerotic disease involving the thoracic aorta. No evidence for 8 dissection. The main  pulmonary artery appears patent there is no abnormal filling defects within the main pulmonary artery or its branches.  Lungs/Pleura: There is atelectasis/ scarring identified within the lung bases. A calcified granuloma is noted in the left base. No airspace consolidation.  Musculoskeletal: There is mild spondylosis identified within the thoracic spine.  Review of the MIP images confirms the above findings.  CTA ABDOMEN AND PELVIS FINDINGS  Hepatobiliary: Normal appearance of the liver. There is diffuse gallbladder wall thickening measuring up to 8 mm. There is no biliary dilatation.  Pancreas: Normal appearance of the pancreas.  Spleen: The spleen is unremarkable.  Adrenals/Urinary Tract: The adrenal glands are both normal. There is mild bilateral renal cortical thinning. The urinary bladder is within normal limits.  Stomach/Bowel: Small hiatal hernia. The stomach is otherwise unremarkable. The small bowel loops have a normal course and caliber without obstruction. The appendix is visualized and appears normal. Multiple distal colonic diverticula identified without acute inflammation.  Vascular/Lymphatic: There is calcified atherosclerotic disease involving the normal caliber abdominal aorta. There is no dissection. No retroperitoneal adenopathy.  Reproductive: Previous hysterectomy. The adnexal structures appear normal.  Other: There is no free fluid or fluid collections within the abdomen or pelvis.  Musculoskeletal: Review of the visualized osseous structures is significant for degenerative disc disease within the L4-5 level.  Review of the MIP images confirms the above findings.  IMPRESSION: 1. No evidence for aortic dissection. 2. Atherosclerotic disease. 3. Gallbladder wall thickening. If there are clinical signs or symptoms of cholecystitis consider further investigation with abdominal sonogram. 4. Hiatal hernia 5. Thoracic and lumbar degenerative disc disease.   Electronically Signed   By: TKerby Moors M.D.   On: 07/18/2014 20:35   Ct Cta Abd/pel W/cm &/or W/o Cm  07/18/2014   CLINICAL DATA:  Nausea and vomiting.  Intermittent upper back pain.  EXAM: CT ANGIOGRAPHY CHEST, ABDOMEN AND PELVIS  TECHNIQUE: Multidetector CT imaging through the chest, abdomen and pelvis was performed using the standard protocol during bolus administration of intravenous contrast. Multiplanar reconstructed images and MIPs were obtained and reviewed to evaluate the vascular anatomy.  CONTRAST:  1010mOMNIPAQUE IOHEXOL 350 MG/ML SOLN  COMPARISON:  None.  FINDINGS: CTA CHEST FINDINGS  Mediastinum: The heart size is normal. There is a small to moderate pericardial effusion identified. There is calcified atherosclerotic disease involving the thoracic aorta. No evidence for 8 dissection. The main pulmonary artery appears patent there is no abnormal filling defects within the main pulmonary artery or its branches.  Lungs/Pleura: There is atelectasis/ scarring identified within the lung bases. A calcified granuloma is noted in the left base. No  airspace consolidation.  Musculoskeletal: There is mild spondylosis identified within the thoracic spine.  Review of the MIP images confirms the above findings.  CTA ABDOMEN AND PELVIS FINDINGS  Hepatobiliary: Normal appearance of the liver. There is diffuse gallbladder wall thickening measuring up to 8 mm. There is no biliary dilatation.  Pancreas: Normal appearance of the pancreas.  Spleen: The spleen is unremarkable.  Adrenals/Urinary Tract: The adrenal glands are both normal. There is mild bilateral renal cortical thinning. The urinary bladder is within normal limits.  Stomach/Bowel: Small hiatal hernia. The stomach is otherwise unremarkable. The small bowel loops have a normal course and caliber without obstruction. The appendix is visualized and appears normal. Multiple distal colonic diverticula identified without acute inflammation.  Vascular/Lymphatic: There is calcified atherosclerotic  disease involving the normal caliber abdominal aorta. There is no dissection. No retroperitoneal adenopathy.  Reproductive: Previous hysterectomy. The adnexal structures appear normal.  Other: There is no free fluid or fluid collections within the abdomen or pelvis.  Musculoskeletal: Review of the visualized osseous structures is significant for degenerative disc disease within the L4-5 level.  Review of the MIP images confirms the above findings.  IMPRESSION: 1. No evidence for aortic dissection. 2. Atherosclerotic disease. 3. Gallbladder wall thickening. If there are clinical signs or symptoms of cholecystitis consider further investigation with abdominal sonogram. 4. Hiatal hernia 5. Thoracic and lumbar degenerative disc disease.   Electronically Signed   By: Kerby Moors M.D.   On: 07/18/2014 20:35      ASSESSMENT:  NSTEMI Atrial Flutter Transaminitis (normal alk phos) HTN T2DM Leukocytosis (Trending down)/ Acute Sinusitus Nausea   PLAN:   -NSTEMI>>> hold off on cath while working up LFT abnormalities - Atrial Flutter >>> diltiazem bolus with drip -Transaminitis >> hold statin, check acute hepatitis panel -HTN >> borderline hypotension, hold home BP meds for now - T2DM >>  SSI - Sinusitus>>> restart doxycycline -Nausea>> Zofran prn  Annamary Rummage  [facility] PGY2 Pager: [phone] 07/20/2014  8:14 AM   I have examined the patient and reviewed assessment and plan and discussed with patient.  Agree with above as stated.  INR too high for cath.  She does not have brisk radial pulses either so femoral approach may need to be pursued. GI consult pending.  Would have to know risk of longterm bleeding- if liver issue could cause bleeding in the future; before deciding whether BMS or DES should be used.  Brevin Mcfadden S.

## 2014-07-20 NOTE — Progress Notes (Signed)
Utilization review complete. Darly Fails RN CCM Case Mgmt phone [phone] 

## 2014-07-20 NOTE — Progress Notes (Signed)
Patient went into Afib after "coughing spell". Heart rate 140's to 150's. Scheduled lopressor 12.5mg  given along with 55mcg of fentanyl . BP normal and patient only complaining of nausea for which  antiemetics given earlier.   Marin Milley RN

## 2014-07-20 NOTE — Consult Note (Signed)
[facility] Consult: 11:42 AM 07/20/2014  LOS: 2 days    Referring Provider: Dr Rudene Anda  Primary Care Physician:  Loura Pardon, MD Primary Gastroenterologist:  Dr. Oretha Caprice.      Reason for Consultation:  Abnormal LFTs   HPI: Bianca Mueller is a 80 y.o. female.  Type 2 DM, Hypertension, Dyslipidemia.  Anterior MI, treated with TPA 1989, subsequent Angioplasty. Non-ischemic myoview and EF 58% 2004.  On the 11/11 she will celebrate her 67th wedding anniversary.   Developed an URI with clear, productive cough, sinus congestion.  Dr Glori Bickers RXd 5 days Doxycycline starting 10/28.  Still having cough and feeling weak.  On 10/31 acute onset of 10/10 pain in upper back, not favoring right or left. No nausea until she was admitted.  Husband says pt was "out of it".  Had no precedent GI sxs: no dyspepsia, anorexia, fatty food intolerance.   Admitted with SOB and upper back pain.  Ruled in for non-STEMI (max Toponin I is 3.29). pBNP is 2238. Her WBCs were 19.1 but she was/is afebrile.  LFTs previously normal 01/2014 but now elevated with AST/ALT 700s/600s, normal alk phos and T bili. PT and INR are rising. Blood sugar in mid 300s at arrival.  CT angio of chest does show thickened GB, HH, degenerative thoracolumbar disease but no aortic dissection. She has not had ultrasound.  No ETOH, no NSAIDs.  Some chills, no fever.  Since resolving in ED, back pain has not recurred.      ENDOSCOPIC STUDIES: 06/2010  EGD  Dr Ardis Hughs INDICATIONS: intermittent nausea, vomiting ENDOSCOPIC IMPRESSION:  1) Small hiatal hernia  2) Mild gastritis, biopsied to check for H. Pylori:  Path reactive gastropathy, no H Pylori.  3) Otherwise normal examination RECOMMENDATIONS:  Continue once daily PPI (nexium) since it seems to be  helping.   2010  Colonoscopy  By Keith Rake MD in [place].  For hx of adenomatous polyps 2 small, sessile, adenomatous colon polyps, diverticulosis throughout > in sigmoid.  Int hemorrhoids.  Given advanced age, no plans to repeat study.     Past Medical History  Diagnosis Date  . Diabetes mellitus     type II  . Osteoporosis   . Hyperlipidemia   . Hypertension   . Hypercalcemia   . CAD (coronary artery disease)   . Zoster     past  . Diverticulosis   . Hx of adenomatous colonic polyps 2010    last colonscopy 2010,Dr Vira Agar - no need for further  colonoscopy given age  . Myocardial infarct     1998    Past Surgical History  Procedure Laterality Date  . Abdominal hysterectomy      ovaries intact  . Shoulder surgery      right    Prior to Admission medications   Medication Sig Start Date End Date Taking? Authorizing Provider  amLODipine (NORVASC) 5 MG tablet Take 5 mg by mouth at bedtime.   Yes Historical Provider, MD  aspirin EC 325 MG tablet Take 325 mg by mouth daily.  Yes Historical Provider, MD  cloNIDine (CATAPRES) 0.2 MG tablet 1/2 tablet by mouth every AM, 1/2 tablet by mouth at lunch and 1 tablet by mouth every PM. 10/02/13  Yes Abner Greenspan, MD  esomeprazole (NEXIUM) 40 MG capsule Take 40 mg by mouth daily at 12 noon.   Yes Historical Provider, MD  glipiZIDE (GLUCOTROL XL) 10 MG 24 hr tablet Take 10 mg by mouth daily with breakfast.   Yes Historical Provider, MD  labetalol (NORMODYNE) 100 MG tablet Take 100 mg by mouth daily.   Yes Historical Provider, MD  lisinopril-hydrochlorothiazide (ZESTORETIC) 20-12.5 MG per tablet Take 1 tablet by mouth daily. 03/24/14  Yes Abner Greenspan, MD  simvastatin (ZOCOR) 20 MG tablet Take 20 mg by mouth daily at 6 PM.   Yes Historical Provider, MD  sitaGLIPtin (JANUVIA) 100 MG tablet Take 100 mg by mouth daily.   Yes Historical Provider, MD  doxycycline (VIBRA-TABS) 100 MG tablet Take 1 tablet (100 mg total) by mouth 2 (two)  times daily. 07/15/14   [place], MD  glucose blood (ONE TOUCH ULTRA TEST) test strip USE ONE STRIP TO CHECK BLOOD SUGAR ONCE DAILY AND PRN FOR DM 250.0 10/02/13   Abner Greenspan, MD  nitroGLYCERIN (NITROSTAT) 0.4 MG SL tablet Place 1 tablet (0.4 mg total) under the tongue every 5 (five) minutes as needed. For chest pain and if not improved seek medical attention in ER. 07/03/13   Abner Greenspan, MD  [facility] LANCETS 36O MISC Use as instructed to check blood sugar once daily or as needed.  250.00 06/04/14   Abner Greenspan, MD    Scheduled Meds: . antiseptic oral rinse  7 mL Mouth Rinse BID  . aspirin EC  81 mg Oral Daily  . doxycycline  100 mg Oral BID  . insulin aspart  0-24 Units Subcutaneous TID WC  . metoprolol tartrate  12.5 mg Oral BID  . pantoprazole  40 mg Oral Daily  . phytonadione  2.5 mg Oral Once  . sodium chloride  500 mL Intravenous Once  . sodium chloride  3 mL Intravenous Q12H   Infusions: . diltiazem (CARDIZEM) infusion 10 mg/hr (07/20/14 0918)  . heparin 850 Units/hr (07/20/14 0700)   PRN Meds: sodium chloride, acetaminophen, fentaNYL, nitroGLYCERIN, ondansetron (ZOFRAN) IV, prochlorperazine, sodium chloride   Allergies as of 07/18/2014 - Review Complete 07/18/2014  Allergen Reaction Noted  . Alendronate sodium  01/21/2007  . Isosorbide  01/21/2007  . Metformin and related  06/07/2011  . Metronidazole  11/27/2008  . Nifedipine  01/21/2007    Family History  Problem Relation Age of Onset  . Heart disease Mother     CAD  . Heart disease Father     CAD  . Heart disease Brother     CAD    History   Social History  . Marital Status: Married    Spouse Name: N/A    Number of Children: N/A  . Years of Education: N/A   Occupational History  . Not on file.   Social History Main Topics  . Smoking status: Former Research scientist (life sciences)  . Smokeless tobacco: Not on file  . Alcohol Use: No  . Drug Use: No  . Sexual Activity: Not on file   Other Topics Concern    . Not on file   Social History Narrative    REVIEW OF SYSTEMS: Constitutional:  Generally strong and highly functional: mows with push mower, cleans and cooks for her husband, drives  ENT:  No nose bleeds,  No congestion.  Pulm:  Per HPI CV:  No palpitations, no LE edema.  GU:  No hematuria, no frequency GI:  Per HPI Heme:  No hx anemia   Transfusions:  none Neuro:  No headaches, no peripheral tingling or numbness Derm:  No itching, no rash or sores.  Endocrine:  No sweats or chills.  No polyuria or dysuria Immunization:  Flu shot recently given.  Travel:  None beyond local counties in last few months.    PHYSICAL EXAM: Vital signs in last 24 hours: Filed Vitals:   07/20/14 1126  BP: 109/87  Pulse: 101  Temp: 97.8 F (36.6 C)  Resp: 16   Wt Readings from Last 3 Encounters:  07/20/14 113 lb 5.1 oz (51.4 kg)  07/15/14 111 lb 12 oz (50.689 kg)  02/17/14 113 lb (51.256 kg)    General: elderly, well appearing WF.  comfortable Head:  No asymmetry or swelling  Eyes:  No icterus or pallor.  EOMI Ears:  Slightly HOH  Nose:  No congestion or discharge.  Mouth:  Clear but slightly dry oral MM.  Neck:  No mass, no bruit, no JVD Lungs:  Reduced BS on right lung, clear sputum with cough.  No dyspnea.  No costal tenderness.  Heart: RRR.  No mrg. Abdomen:  Soft, NT, ND.  No CMV tenderness.   Rectal: deferred   Musc/Skeltl: no joint redness or deformity Extremities:  No CCE  Neurologic:  Pleasant, tired but easily aroused.  Oriented x 3.  No limb weakness or gross deficits.  Skin:  No telangectasia or bruising Tattoos:  none Nodes:  No cervical adenopathy.    Psych:  Pleasant, relaxed, cooperative   Intake/Output from previous day: 11/01 0701 - 11/02 0700 In: 420.9 [P.O.:240; I.V.:180.9] Out: 450 [Urine:450] Intake/Output this shift: Total I/O In: 3 [I.V.:3] Out: -   LAB RESULTS:  Recent Labs  07/18/14 1820 07/19/14 0937 07/20/14 0228  WBC 19.1* 13.9* 12.6*   HGB 11.3* 12.2 10.7*  HCT 34.4* 36.7 33.1*  PLT 453* 326 267   BMET Lab Results  Component Value Date   NA 140 07/19/2014   NA 141 07/18/2014   NA 140 02/10/2014   K 4.0 07/19/2014   K 3.9 07/18/2014   K 3.8 02/10/2014   CL 103 07/19/2014   CL 103 07/18/2014   CL 105 02/10/2014   CO2 18* 07/19/2014   CO2 15* 07/18/2014   CO2 26 02/10/2014   GLUCOSE 197* 07/19/2014   GLUCOSE 366* 07/18/2014   GLUCOSE 116* 02/10/2014   BUN 25* 07/19/2014   BUN 18 07/18/2014   BUN 21 02/10/2014   CREATININE 1.11* 07/19/2014   CREATININE 0.78 07/18/2014   CREATININE 0.8 02/10/2014   CALCIUM 8.6 07/19/2014   CALCIUM 8.4 07/18/2014   CALCIUM 9.4 02/10/2014   LFT  Recent Labs  07/20/14 0228  PROT 6.1  ALBUMIN 2.6*  AST 727*  ALT 608*  ALKPHOS 89  BILITOT 0.5  BILIDIR <0.2  IBILI NOT CALCULATED     Ref. Range 07/19/2014 09:37 07/19/2014 19:23 07/20/2014 02:28 07/20/2014 09:22  Heparin Unfractionated Latest Range: 0.30-0.70 IU/mL 0.26 (L) 0.34 0.24 (L)   Prothrombin Time Latest Range: 11.6-15.2 seconds 19.9 (H)  21.1 (H) 20.9 (H)  INR Latest Range: 0.00-1.49  1.68 (H)  1.81 (H) 1.78 (H)   Hepatitis Panel Acute hepatitis panel ordered.   Lipase  No results found for: LIPASE  Drugs of Abuse  No results found for:  LABOPIA, COCAINSCRNUR, LABBENZ, AMPHETMU, THCU, LABBARB   RADIOLOGY STUDIES: Dg Chest Port 1 View 07/18/2014   CLINICAL DATA:  Nausea vomiting and decreased oxygen saturation. Former smoker.  EXAM: PORTABLE CHEST - 1 VIEW  COMPARISON:  04/04/2014.  FINDINGS: Mild cardiac enlargement. Atherosclerotic disease is noted within the aortic arch. There are 2 nodular densities in the left base that are indeterminate. No pleural effusion identified. Right lung is clear.  IMPRESSION: 1. Two nodular opacities are identified in the left base. In this patient who has a history of smoking a initial further workup with a dedicated PA and lateral upright radiographs are recommended.    Electronically Signed   By: Kerby Moors M.D.   On: 07/18/2014 19:25   Ct Angio Chest Aorta W/cm &/or Wo/cm Ct Cta Abd/pel W/cm &/or W/o Cm 07/18/2014   CLINICAL DATA:  Nausea and vomiting.  Intermittent upper back pain.  EXAM: CT ANGIOGRAPHY CHEST, ABDOMEN AND PELVIS  TECHNIQUE: Multidetector CT imaging through the chest, abdomen and pelvis was performed using the standard protocol during bolus administration of intravenous contrast. Multiplanar reconstructed images and MIPs were obtained and reviewed to evaluate the vascular anatomy.  CONTRAST:  151m OMNIPAQUE IOHEXOL 350 MG/ML SOLN  COMPARISON:  None.  FINDINGS: CTA CHEST FINDINGS  Mediastinum: The heart size is normal. There is a small to moderate pericardial effusion identified. There is calcified atherosclerotic disease involving the thoracic aorta. No evidence for 8 dissection. The main pulmonary artery appears patent there is no abnormal filling defects within the main pulmonary artery or its branches.  Lungs/Pleura: There is atelectasis/ scarring identified within the lung bases. A calcified granuloma is noted in the left base. No airspace consolidation.  Musculoskeletal: There is mild spondylosis identified within the thoracic spine.  Review of the MIP images confirms the above findings.  CTA ABDOMEN AND PELVIS FINDINGS  Hepatobiliary: Normal appearance of the liver. There is diffuse gallbladder wall thickening measuring up to 8 mm. There is no biliary dilatation.  Pancreas: Normal appearance of the pancreas.  Spleen: The spleen is unremarkable.  Adrenals/Urinary Tract: The adrenal glands are both normal. There is mild bilateral renal cortical thinning. The urinary bladder is within normal limits.  Stomach/Bowel: Small hiatal hernia. The stomach is otherwise unremarkable. The small bowel loops have a normal course and caliber without obstruction. The appendix is visualized and appears normal. Multiple distal colonic diverticula identified without  acute inflammation.  Vascular/Lymphatic: There is calcified atherosclerotic disease involving the normal caliber abdominal aorta. There is no dissection. No retroperitoneal adenopathy.  Reproductive: Previous hysterectomy. The adnexal structures appear normal.  Other: There is no free fluid or fluid collections within the abdomen or pelvis.  Musculoskeletal: Review of the visualized osseous structures is significant for degenerative disc disease within the L4-5 level.  Review of the MIP images confirms the above findings.  IMPRESSION: 1. No evidence for aortic dissection. 2. Atherosclerotic disease. 3. Gallbladder wall thickening. If there are clinical signs or symptoms of cholecystitis consider further investigation with abdominal sonogram. 4. Hiatal hernia 5. Thoracic and lumbar degenerative disc disease.   Electronically Signed   By: TKerby MoorsM.D.   On: 07/18/2014 20:35     IMPRESSION:   *  Transaminitis with pt c/o scapular and back pain, GB wall thick on CT angio of chest.  Also with recent course of Doxycycline, could be drug induced hepatitis.   *  SOB and upper back pain.  Ruled in for non STEMI.  Previous MI 1989 treated  with TPA. S/p Cardiac stenting.  Cardiac cath on hold due to abnormal LFTs.   *  URI.  Started Doxy 10/28 for 5 days.  Course was interrupted since admission but restarted today. Marland Kitchen     PLAN:     *  Ultrasound  Abdomen. Keep NPO til after   *  Follow LFTs, check lipase.    Azucena Freed  07/20/2014, 11:42 AM Pager: [phone]

## 2014-07-20 NOTE — Progress Notes (Signed)
ANTICOAGULATION CONSULT NOTE - Follow Up Consult  Pharmacy Consult for heparin Indication: chest pain/ACS  Allergies  Allergen Reactions  . Alendronate Sodium   . Isosorbide   . Metformin And Related     Decreases appetite resulting in weight loss  . Metronidazole     REACTION: sick/ GI  . Nifedipine     Patient Measurements: Height: 5\' 2"  (157.5 cm) Weight: 113 lb 5.1 oz (51.4 kg) IBW/kg (Calculated) : 50.1 Heparin Dosing Weight: 51 kg  Vital Signs: Temp: 97.7 F (36.5 C) (11/02 1600) Temp Source: Oral (11/02 1600) BP: 128/80 mmHg (11/02 1600) Pulse Rate: 112 (11/02 1600)  Labs:  Recent Labs  07/18/14 1820 07/18/14 2317  07/19/14 0937 07/19/14 1923 07/20/14 0228 07/20/14 0922 07/20/14 1450  HGB 11.3*  --   --  12.2  --  10.7*  --   --   HCT 34.4*  --   --  36.7  --  33.1*  --   --   PLT 453*  --   --  326  --  267  --   --   LABPROT  --   --   --  19.9*  --  21.1* 20.9*  --   INR  --   --   --  1.68*  --  1.81* 1.78*  --   HEPARINUNFRC  --   --   < > 0.26* 0.34 0.24*  --  <0.10*  CREATININE 0.78  --   --  1.11*  --   --   --   --   TROPONINI  --  3.29*  --  2.73*  --   --   --   --   < > = values in this interval not displayed.  Estimated Creatinine Clearance: 29.3 mL/min (by C-G formula based on Cr of 1.11).   Assessment: Patient is an 80 y/o F on heparin for ACS with cath procedure postponed today d/t elevated LFTs.  Heparin level now back undetectable despite rate increased to 850 units/hr this morning.  No issues with IV line per RN.  Goal of Therapy:  Heparin level 0.3-0.7 units/ml Monitor platelets by anticoagulation protocol: Yes   Plan:  1) increase heparin drip to 950 units/hr 1) check 8 hour heparin level 2) watch LFTs, f/u GI recom 3) f/u plan for cath   Arayah Krouse P 07/20/2014,4:15 PM

## 2014-07-20 NOTE — Progress Notes (Signed)
At 1am patient went back in to NSR and blood pressure dropped to 85/62. Normal Saline started at 50cc/hr. Compazine given earlier and minimal relief noted.      Karlton Maya RN

## 2014-07-20 NOTE — Progress Notes (Signed)
ANTICOAGULATION CONSULT NOTE - Follow Up Consult  Pharmacy Consult for Heparin  Indication: chest pain/ACS  Allergies  Allergen Reactions  . Alendronate Sodium   . Isosorbide   . Metformin And Related     Decreases appetite resulting in weight loss  . Metronidazole     REACTION: sick/ GI  . Nifedipine     Patient Measurements: Height: 5\' 2"  (157.5 cm) Weight: 114 lb 10.2 oz (52 kg) IBW/kg (Calculated) : 50.1 Heparin Dosing Weight: 51 kg   Vital Signs: Temp: 98.2 F (36.8 C) (11/02 0000) Temp Source: Oral (11/02 0000) BP: 101/79 mmHg (11/02 0300)  Labs:  Recent Labs  07/18/14 1820 07/18/14 2317 07/19/14 0937 07/19/14 1923 07/20/14 0228  HGB 11.3*  --  12.2  --  10.7*  HCT 34.4*  --  36.7  --  33.1*  PLT 453*  --  326  --  267  LABPROT  --   --  19.9*  --  21.1*  INR  --   --  1.68*  --  1.81*  HEPARINUNFRC  --   --  0.26* 0.34 0.24*  CREATININE 0.78  --  1.11*  --   --   TROPONINI  --  3.29* 2.73*  --   --     Estimated Creatinine Clearance: 29.3 mL/min (by C-G formula based on Cr of 1.11).    Assessment: 80 yo female with chest pain for heparin  Goal of Therapy:  Heparin level 0.3-0.7 units/ml Monitor platelets by anticoagulation protocol: Yes   Plan:  Increase Heparin  850 units/hr F/U after cath  Phillis Knack, PharmD, BCPS

## 2014-07-21 ENCOUNTER — Encounter (HOSPITAL_COMMUNITY): Payer: Self-pay | Admitting: *Deleted

## 2014-07-21 DIAGNOSIS — R110 Nausea: Secondary | ICD-10-CM | POA: Insufficient documentation

## 2014-07-21 LAB — CBC WITH DIFFERENTIAL/PLATELET
BASOS ABS: 0 10*3/uL (ref 0.0–0.1)
Basophils Relative: 0 % (ref 0–1)
Eosinophils Absolute: 0 10*3/uL (ref 0.0–0.7)
Eosinophils Relative: 0 % (ref 0–5)
HCT: 35.7 % — ABNORMAL LOW (ref 36.0–46.0)
Hemoglobin: 11.8 g/dL — ABNORMAL LOW (ref 12.0–15.0)
LYMPHS PCT: 6 % — AB (ref 12–46)
Lymphs Abs: 1.2 10*3/uL (ref 0.7–4.0)
MCH: 28.9 pg (ref 26.0–34.0)
MCHC: 33.1 g/dL (ref 30.0–36.0)
MCV: 87.3 fL (ref 78.0–100.0)
Monocytes Absolute: 0.8 10*3/uL (ref 0.1–1.0)
Monocytes Relative: 4 % (ref 3–12)
Neutro Abs: 16.6 10*3/uL — ABNORMAL HIGH (ref 1.7–7.7)
Neutrophils Relative %: 90 % — ABNORMAL HIGH (ref 43–77)
PLATELETS: 330 10*3/uL (ref 150–400)
RBC: 4.09 MIL/uL (ref 3.87–5.11)
RDW: 14.1 % (ref 11.5–15.5)
WBC: 18.7 10*3/uL — AB (ref 4.0–10.5)

## 2014-07-21 LAB — PROTIME-INR
INR: 1.82 — AB (ref 0.00–1.49)
PROTHROMBIN TIME: 21.2 s — AB (ref 11.6–15.2)

## 2014-07-21 LAB — COMPREHENSIVE METABOLIC PANEL
ALT: 788 U/L — AB (ref 0–35)
AST: 565 U/L — ABNORMAL HIGH (ref 0–37)
Albumin: 2.7 g/dL — ABNORMAL LOW (ref 3.5–5.2)
Alkaline Phosphatase: 98 U/L (ref 39–117)
Anion gap: 17 — ABNORMAL HIGH (ref 5–15)
BUN: 32 mg/dL — ABNORMAL HIGH (ref 6–23)
CO2: 19 mEq/L (ref 19–32)
CREATININE: 0.77 mg/dL (ref 0.50–1.10)
Calcium: 8.5 mg/dL (ref 8.4–10.5)
Chloride: 102 mEq/L (ref 96–112)
GFR calc non Af Amer: 74 mL/min — ABNORMAL LOW (ref >90)
GFR, EST AFRICAN AMERICAN: 86 mL/min — AB (ref >90)
Glucose, Bld: 99 mg/dL (ref 70–99)
Potassium: 3.8 mEq/L (ref 3.7–5.3)
SODIUM: 138 meq/L (ref 137–147)
TOTAL PROTEIN: 6.4 g/dL (ref 6.0–8.3)
Total Bilirubin: 0.6 mg/dL (ref 0.3–1.2)

## 2014-07-21 LAB — CBC
HCT: 34.6 % — ABNORMAL LOW (ref 36.0–46.0)
Hemoglobin: 11.4 g/dL — ABNORMAL LOW (ref 12.0–15.0)
MCH: 28.7 pg (ref 26.0–34.0)
MCHC: 32.9 g/dL (ref 30.0–36.0)
MCV: 87.2 fL (ref 78.0–100.0)
PLATELETS: 308 10*3/uL (ref 150–400)
RBC: 3.97 MIL/uL (ref 3.87–5.11)
RDW: 14 % (ref 11.5–15.5)
WBC: 16.8 10*3/uL — ABNORMAL HIGH (ref 4.0–10.5)

## 2014-07-21 LAB — GLUCOSE, CAPILLARY
GLUCOSE-CAPILLARY: 119 mg/dL — AB (ref 70–99)
Glucose-Capillary: 148 mg/dL — ABNORMAL HIGH (ref 70–99)
Glucose-Capillary: 239 mg/dL — ABNORMAL HIGH (ref 70–99)
Glucose-Capillary: 148 mg/dL — ABNORMAL HIGH (ref 70–99)

## 2014-07-21 LAB — HEPARIN LEVEL (UNFRACTIONATED)
HEPARIN UNFRACTIONATED: 0.34 [IU]/mL (ref 0.30–0.70)
Heparin Unfractionated: 0.2 IU/mL — ABNORMAL LOW (ref 0.30–0.70)
Heparin Unfractionated: 0.43 IU/mL (ref 0.30–0.70)

## 2014-07-21 LAB — LIPASE, BLOOD: LIPASE: 18 U/L (ref 11–59)

## 2014-07-21 MED ORDER — AMOXICILLIN-POT CLAVULANATE 875-125 MG PO TABS
1.0000 | ORAL_TABLET | Freq: Two times a day (BID) | ORAL | Status: AC
  Administered 2014-07-21 – 2014-07-25 (×10): 1 via ORAL
  Filled 2014-07-21 (×11): qty 1

## 2014-07-21 NOTE — Progress Notes (Signed)
SUBJECTIVE:  Nausea has improved, no abdominal or back pain.  Overall feeling better.  Wants to eat something.  OBJECTIVE:   Vitals:   Filed Vitals:   07/21/14 0300 07/21/14 0400 07/21/14 0500 07/21/14 0700  BP: 132/61 153/65 134/50 172/68  Pulse: 70 78 75 107  Temp:      TempSrc:      Resp: 18 18 19 22   Height:      Weight:   114 lb 10.2 oz (52 kg)   SpO2: 94% 94% 92% 91%   I&O's:    Intake/Output Summary (Last 24 hours) at 07/21/14 9150 Last data filed at 07/21/14 0700  Gross per 24 hour  Intake 1204.84 ml  Output   1075 ml  Net 129.84 ml   TELEMETRY: Reviewed telemetry pt in a fib with rate ~120s:  PHYSICAL EXAM  General: Well developed, well nourished, in no acute distress Head:   Normal cephalic and atraumatic, mild Maxillary sinus tenderness  Lungs:  Clear bilaterally to auscultation. Heart:  irregularly irregular tachycardiac, No JVD.   Abdomen: abdomen soft and non-tender Msk:  Back normal,  Normal strength and tone for age. Extremities:  Trace edema.   Neuro: Alert and oriented. Psych:  Normal affect, responds appropriately   LABS: Basic Metabolic Panel:  Recent Labs  07/18/14 2317 07/19/14 0937 07/21/14 0111  NA  --  140 138  K  --  4.0 3.8  CL  --  103 102  CO2  --  18* 19  GLUCOSE  --  197* 99  BUN  --  25* 32*  CREATININE  --  1.11* 0.77  CALCIUM  --  8.6 8.5  MG 2.2  --   --    Liver Function Tests:  Recent Labs  07/20/14 0228 07/21/14 0111  AST 727* 565*  ALT 608* 788*  ALKPHOS 89 98  BILITOT 0.5 0.6  PROT 6.1 6.4  ALBUMIN 2.6* 2.7*    Recent Labs  07/21/14 0111  LIPASE 18   CBC:  Recent Labs  07/20/14 0228 07/21/14 0111  WBC 12.6* 16.8*  HGB 10.7* 11.4*  HCT 33.1* 34.6*  MCV 87.6 87.2  PLT 267 308   Cardiac Enzymes:  Recent Labs  07/18/14 2317 07/19/14 0937  TROPONINI 3.29* 2.73*   BNP: Invalid input(s): POCBNP D-Dimer: No results for input(s): DDIMER in the last 72 hours. Hemoglobin A1C:  Recent  Labs  07/18/14 2317  HGBA1C 7.1*   Fasting Lipid Panel:  Recent Labs  07/19/14 0937  CHOL 84  HDL 24*  LDLCALC 41  TRIG 94  CHOLHDL 3.5   Thyroid Function Tests:  Recent Labs  07/18/14 2317  TSH 2.080   Anemia Panel: No results for input(s): VITAMINB12, FOLATE, FERRITIN, TIBC, IRON, RETICCTPCT in the last 72 hours. Coag Panel:   Lab Results  Component Value Date   INR 1.82* 07/21/2014   INR 1.78* 07/20/2014   INR 1.81* 07/20/2014    RADIOLOGY: US Abdomen Port  07/20/2014   CLINICAL DATA:  Diffuse gallbladder wall thickening on a recent abdomen CTA.  EXAM: ULTRASOUND PORTABLE ABDOMEN  COMPARISON:  Abdomen CTA dated 07/18/2014.  FINDINGS: Gallbladder: No gallstones or wall thickening visualized. No sonographic Murphy sign noted.  Common bile duct: Diameter: 6.5 mm  Liver: No focal lesion identified. Within normal limits in parenchymal echogenicity.  IVC: No abnormality visualized.  Pancreas: Visualized portion unremarkable.  Spleen: Size and appearance within normal limits.  Right Kidney: Length: 10.4 cm. Echogenicity within normal limits.  No mass or hydronephrosis visualized.  Left Kidney: Length: 10.1 cm. Echogenicity within normal limits. No mass or hydronephrosis visualized.  Abdominal aorta: No aneurysm visualized.  Other findings: Small bilateral pleural effusions.  IMPRESSION: 1. The gallbladder has a normal appearance today with no gallstones or wall thickening. 2. Small bilateral pleural effusions.   Electronically Signed   By: Enrique Sack M.D.   On: 07/20/2014 17:05   Dg Chest Port 1 View  07/18/2014   CLINICAL DATA:  Nausea vomiting and decreased oxygen saturation. Former smoker.  EXAM: PORTABLE CHEST - 1 VIEW  COMPARISON:  04/04/2014.  FINDINGS: Mild cardiac enlargement. Atherosclerotic disease is noted within the aortic arch. There are 2 nodular densities in the left base that are indeterminate. No pleural effusion identified. Right lung is clear.  IMPRESSION: 1. Two  nodular opacities are identified in the left base. In this patient who has a history of smoking a initial further workup with a dedicated PA and lateral upright radiographs are recommended.   Electronically Signed   By: Kerby Moors M.D.   On: 07/18/2014 19:25   Ct Angio Chest Aorta W/cm &/or Wo/cm  07/18/2014   CLINICAL DATA:  Nausea and vomiting.  Intermittent upper back pain.  EXAM: CT ANGIOGRAPHY CHEST, ABDOMEN AND PELVIS  TECHNIQUE: Multidetector CT imaging through the chest, abdomen and pelvis was performed using the standard protocol during bolus administration of intravenous contrast. Multiplanar reconstructed images and MIPs were obtained and reviewed to evaluate the vascular anatomy.  CONTRAST:  143m OMNIPAQUE IOHEXOL 350 MG/ML SOLN  COMPARISON:  None.  FINDINGS: CTA CHEST FINDINGS  Mediastinum: The heart size is normal. There is a small to moderate pericardial effusion identified. There is calcified atherosclerotic disease involving the thoracic aorta. No evidence for 8 dissection. The main pulmonary artery appears patent there is no abnormal filling defects within the main pulmonary artery or its branches.  Lungs/Pleura: There is atelectasis/ scarring identified within the lung bases. A calcified granuloma is noted in the left base. No airspace consolidation.  Musculoskeletal: There is mild spondylosis identified within the thoracic spine.  Review of the MIP images confirms the above findings.  CTA ABDOMEN AND PELVIS FINDINGS  Hepatobiliary: Normal appearance of the liver. There is diffuse gallbladder wall thickening measuring up to 8 mm. There is no biliary dilatation.  Pancreas: Normal appearance of the pancreas.  Spleen: The spleen is unremarkable.  Adrenals/Urinary Tract: The adrenal glands are both normal. There is mild bilateral renal cortical thinning. The urinary bladder is within normal limits.  Stomach/Bowel: Small hiatal hernia. The stomach is otherwise unremarkable. The small bowel  loops have a normal course and caliber without obstruction. The appendix is visualized and appears normal. Multiple distal colonic diverticula identified without acute inflammation.  Vascular/Lymphatic: There is calcified atherosclerotic disease involving the normal caliber abdominal aorta. There is no dissection. No retroperitoneal adenopathy.  Reproductive: Previous hysterectomy. The adnexal structures appear normal.  Other: There is no free fluid or fluid collections within the abdomen or pelvis.  Musculoskeletal: Review of the visualized osseous structures is significant for degenerative disc disease within the L4-5 level.  Review of the MIP images confirms the above findings.  IMPRESSION: 1. No evidence for aortic dissection. 2. Atherosclerotic disease. 3. Gallbladder wall thickening. If there are clinical signs or symptoms of cholecystitis consider further investigation with abdominal sonogram. 4. Hiatal hernia 5. Thoracic and lumbar degenerative disc disease.   Electronically Signed   By: TKerby MoorsM.D.   On: 07/18/2014 20:35  Ct Cta Abd/pel W/cm &/or W/o Cm  07/18/2014   CLINICAL DATA:  Nausea and vomiting.  Intermittent upper back pain.  EXAM: CT ANGIOGRAPHY CHEST, ABDOMEN AND PELVIS  TECHNIQUE: Multidetector CT imaging through the chest, abdomen and pelvis was performed using the standard protocol during bolus administration of intravenous contrast. Multiplanar reconstructed images and MIPs were obtained and reviewed to evaluate the vascular anatomy.  CONTRAST:  11m OMNIPAQUE IOHEXOL 350 MG/ML SOLN  COMPARISON:  None.  FINDINGS: CTA CHEST FINDINGS  Mediastinum: The heart size is normal. There is a small to moderate pericardial effusion identified. There is calcified atherosclerotic disease involving the thoracic aorta. No evidence for 8 dissection. The main pulmonary artery appears patent there is no abnormal filling defects within the main pulmonary artery or its branches.  Lungs/Pleura: There  is atelectasis/ scarring identified within the lung bases. A calcified granuloma is noted in the left base. No airspace consolidation.  Musculoskeletal: There is mild spondylosis identified within the thoracic spine.  Review of the MIP images confirms the above findings.  CTA ABDOMEN AND PELVIS FINDINGS  Hepatobiliary: Normal appearance of the liver. There is diffuse gallbladder wall thickening measuring up to 8 mm. There is no biliary dilatation.  Pancreas: Normal appearance of the pancreas.  Spleen: The spleen is unremarkable.  Adrenals/Urinary Tract: The adrenal glands are both normal. There is mild bilateral renal cortical thinning. The urinary bladder is within normal limits.  Stomach/Bowel: Small hiatal hernia. The stomach is otherwise unremarkable. The small bowel loops have a normal course and caliber without obstruction. The appendix is visualized and appears normal. Multiple distal colonic diverticula identified without acute inflammation.  Vascular/Lymphatic: There is calcified atherosclerotic disease involving the normal caliber abdominal aorta. There is no dissection. No retroperitoneal adenopathy.  Reproductive: Previous hysterectomy. The adnexal structures appear normal.  Other: There is no free fluid or fluid collections within the abdomen or pelvis.  Musculoskeletal: Review of the visualized osseous structures is significant for degenerative disc disease within the L4-5 level.  Review of the MIP images confirms the above findings.  IMPRESSION: 1. No evidence for aortic dissection. 2. Atherosclerotic disease. 3. Gallbladder wall thickening. If there are clinical signs or symptoms of cholecystitis consider further investigation with abdominal sonogram. 4. Hiatal hernia 5. Thoracic and lumbar degenerative disc disease.   Electronically Signed   By: TKerby MoorsM.D.   On: 07/18/2014 20:35      ASSESSMENT:  NSTEMI Atrial Flutter Transaminitis (normal alk phos) HTN T2DM Leukocytosis  (Trending down)/ Acute Sinusitus Nausea   PLAN:   -NSTEMI>>> hold off on cath while working up LFT abnormalities  - Atrial Flutter/ A fib >>> diltiazem drip  -Transaminitis >> holding statin, not trending down did restarted Doxycycline yesterday and could be drug induced (Leukocytosis trended up).  Abdominal u/s does not show any GB pathology, Lipase wnl, Viral panel negative.  Will D/C doxycycline and check CBC with differential.  -HTN >> Hypertensive this morning at 172/68 while on cardiazem drip and metoprolol 12.5 BID.    - T2DM >>  CBG well controlled with minimal SSI needed, has not been eating well  - Sinusitus>>> d/c doxycycline change to augmentin  - Leukocytosis>> initally trending down but now increaseing.  CTA of Chest/ Abdomen/ Pelvis on 10/31 did not reveal PNA or abscess.  U/A on admission unremarkable. She does have evidence of sinusitis will change Abx as above and monitor.  If not improving will consider repeating CXR and U/A.  -Nausea>> Zofran prn (improved)  Lucious Groves, DO  [facility] PGY2 Pager: [phone] 07/21/2014  7:52 AM   I have examined the patient and reviewed assessment and plan and discussed with patient.  Agree with above as stated.  Holding off on cath due to coagulopathy.  HR increased.  Titrate diltiazem.  Change antibiotic in event that hepatitis is drug induced.   Bryker Fletchall S.

## 2014-07-21 NOTE — Progress Notes (Signed)
ANTICOAGULATION CONSULT NOTE - Follow Up Consult  Pharmacy Consult for heparin  Indication: chest pain/ACS  Allergies  Allergen Reactions  . Alendronate Sodium   . Isosorbide   . Metformin And Related     Decreases appetite resulting in weight loss  . Metronidazole     REACTION: sick/ GI  . Nifedipine     Patient Measurements: Height: 5\' 2"  (157.5 cm) Weight: 114 lb 10.2 oz (52 kg) IBW/kg (Calculated) : 50.1 Heparin Dosing Weight: 52kg  Vital Signs: Temp: 97.2 F (36.2 C) (11/03 1517) Temp Source: Oral (11/03 1517) BP: 155/59 mmHg (11/03 1900) Pulse Rate: 79 (11/03 1900)  Labs:  Recent Labs  07/18/14 2317  07/19/14 0937  07/20/14 0228 07/20/14 0922  07/21/14 0111 07/21/14 0902 07/21/14 1032 07/21/14 1850  HGB  --   < > 12.2  --  10.7*  --   --  11.4* 11.8*  --   --   HCT  --   < > 36.7  --  33.1*  --   --  34.6* 35.7*  --   --   PLT  --   < > 326  --  267  --   --  308 330  --   --   LABPROT  --   < > 19.9*  --  21.1* 20.9*  --  21.2*  --   --   --   INR  --   < > 1.68*  --  1.81* 1.78*  --  1.82*  --   --   --   HEPARINUNFRC  --   --  0.26*  < > 0.24*  --   < > 0.20*  --  0.43 0.34  CREATININE  --   --  1.11*  --   --   --   --  0.77  --   --   --   TROPONINI 3.29*  --  2.73*  --   --   --   --   --   --   --   --   < > = values in this interval not displayed.  Estimated Creatinine Clearance: 40.7 mL/min (by C-G formula based on Cr of 0.77).  Assessment: 50 YOF admitted on 10/31, started on heparin infusion for ACS/NSTEMI. Continues on heparin 1100 units/hr- now therapeutic x2. No overt bleeding noted.  Goal of Therapy:  Heparin level 0.3-0.7 units/ml Monitor platelets by anticoagulation protocol: Yes   Plan:  1. Continue heparin 1100 units/hr IV 2. Daily HL and CBC 3. Follow for plans for cath 4. Follow for s/s bleeding  Glee Lashomb D. Layah Skousen, PharmD, BCPS Clinical Pharmacist Pager: [phone] 07/21/2014 8:03 PM

## 2014-07-21 NOTE — Progress Notes (Signed)
Utilization review complete. Bricia Taher RN CCM Case Mgmt phone [phone] 

## 2014-07-21 NOTE — Progress Notes (Signed)
ANTICOAGULATION CONSULT NOTE - Follow Up Consult  Pharmacy Consult for Heparin  Indication: chest pain/ACS  Allergies  Allergen Reactions  . Alendronate Sodium   . Isosorbide   . Metformin And Related     Decreases appetite resulting in weight loss  . Metronidazole     REACTION: sick/ GI  . Nifedipine     Patient Measurements: Height: 5\' 2"  (157.5 cm) Weight: 113 lb 5.1 oz (51.4 kg) IBW/kg (Calculated) : 50.1 Heparin Dosing Weight: 51 kg   Vital Signs: Temp: 98.4 F (36.9 C) (11/03 0000) Temp Source: Oral (11/03 0000) BP: 131/68 mmHg (11/03 0100) Pulse Rate: 73 (11/03 0100)  Labs:  Recent Labs  07/18/14 1820 07/18/14 2317  07/19/14 0937  07/20/14 0228 07/20/14 0922 07/20/14 1450 07/21/14 0111  HGB 11.3*  --   --  12.2  --  10.7*  --   --  11.4*  HCT 34.4*  --   --  36.7  --  33.1*  --   --  34.6*  PLT 453*  --   --  326  --  267  --   --  308  LABPROT  --   --   < > 19.9*  --  21.1* 20.9*  --  21.2*  INR  --   --   < > 1.68*  --  1.81* 1.78*  --  1.82*  HEPARINUNFRC  --   --   --  0.26*  < > 0.24*  --  <0.10* 0.20*  CREATININE 0.78  --   --  1.11*  --   --   --   --   --   TROPONINI  --  3.29*  --  2.73*  --   --   --   --   --   < > = values in this interval not displayed.  Estimated Creatinine Clearance: 29.3 mL/min (by C-G formula based on Cr of 1.11).  Assessment: 80 yo female with NSTEMI for heparin  Goal of Therapy:  Heparin level 0.3-0.7 units/ml Monitor platelets by anticoagulation protocol: Yes   Plan:  Increase Heparin  1100 units/hr Check heparin level in 8 hours.   Phillis Knack, PharmD, BCPS

## 2014-07-21 NOTE — Plan of Care (Signed)
Problem: Consults Goal: Chest Pain Patient Education (See Patient Education module for education specifics.)  Outcome: Progressing Goal: Skin Care Protocol Initiated - if Braden Score 18 or less If consults are not indicated, leave blank or document N/A  Outcome: Completed/Met Date Met:  07/21/14  Problem: Phase I Progression Outcomes Goal: Hemodynamically stable Outcome: Completed/Met Date Met:  07/21/14 Goal: Anginal pain relieved Outcome: Progressing  Problem: Phase II Progression Outcomes Goal: Hemodynamically stable Outcome: Completed/Met Date Met:  07/21/14

## 2014-07-21 NOTE — Progress Notes (Addendum)
ANTICOAGULATION CONSULT NOTE - Follow Up Consult  Pharmacy Consult for heparin   Indication: chest pain/ACS  Allergies  Allergen Reactions  . Alendronate Sodium   . Isosorbide   . Metformin And Related     Decreases appetite resulting in weight loss  . Metronidazole     REACTION: sick/ GI  . Nifedipine     Patient Measurements: Height: 5\' 2"  (157.5 cm) Weight: 114 lb 10.2 oz (52 kg) IBW/kg (Calculated) : 50.1 Heparin Dosing Weight: 52kg  Vital Signs: BP: 157/103 mmHg (11/03 1100) Pulse Rate: 132 (11/03 1100)  Labs:  Recent Labs  07/18/14 1820 07/18/14 2317  07/19/14 0937  07/20/14 0228 07/20/14 0922 07/20/14 1450 07/21/14 0111 07/21/14 0902 07/21/14 1032  HGB 11.3*  --   --  12.2  --  10.7*  --   --  11.4* 11.8*  --   HCT 34.4*  --   --  36.7  --  33.1*  --   --  34.6* 35.7*  --   PLT 453*  --   --  326  --  267  --   --  308 330  --   LABPROT  --   --   < > 19.9*  --  21.1* 20.9*  --  21.2*  --   --   INR  --   --   < > 1.68*  --  1.81* 1.78*  --  1.82*  --   --   HEPARINUNFRC  --   --   --  0.26*  < > 0.24*  --  <0.10* 0.20*  --  0.43  CREATININE 0.78  --   --  1.11*  --   --   --   --  0.77  --   --   TROPONINI  --  3.29*  --  2.73*  --   --   --   --   --   --   --   < > = values in this interval not displayed.  Estimated Creatinine Clearance: 40.7 mL/min (by C-G formula based on Cr of 0.77).   Medications:  Scheduled:  . amoxicillin-clavulanate  1 tablet Oral Q12H  . antiseptic oral rinse  7 mL Mouth Rinse BID  . aspirin EC  81 mg Oral Daily  . insulin aspart  0-24 Units Subcutaneous TID WC  . metoprolol tartrate  12.5 mg Oral BID  . pantoprazole  40 mg Oral Daily  . sodium chloride  3 mL Intravenous Q12H   Assessment: 87 YOF admitted on 10/31, started on heparin infusion for ACS/NSTEMI. HL WNL at 0.43 at 10:30AM on 11/3. H&H remain low, but improved/stable. Plts WNL. No s/s of bleed per nurse. Bruises are noted to both knees, but were present at  admission.    Goal of Therapy:  Heparin level 0.3-0.7 units/ml Monitor platelets by anticoagulation protocol: Yes   Plan:  Continue heparin 1100 units/hr IV Confirm HL in 8 hours Continue to monitor H&H and platelets and s/s of bleed Continue to monitor daily HL FU on cath  Gloriajean Dell J 07/21/2014,12:20 PM  I agree with the plan above. Elenor Quinones, PharmD

## 2014-07-21 NOTE — Progress Notes (Signed)
Daily Rounding Note  07/21/2014, 10:34 AM  LOS: 3 days   SUBJECTIVE:       Feels better.  No nausea.  "Spitting up" clear sputum from her coughing.  No BMs.  Anorexia is a little better but just wants to have clears for now.  No abd pain   OBJECTIVE:         Vital signs in last 24 hours:    Temp:  [97.7 F (36.5 C)-98.4 F (36.9 C)] 98.4 F (36.9 C) (11/03 0000) Pulse Rate:  [50-142] 107 (11/03 0700) Resp:  [14-24] 22 (11/03 0700) BP: (109-172)/(48-88) 172/68 mmHg (11/03 0700) SpO2:  [91 %-97 %] 91 % (11/03 0700) Weight:  [114 lb 10.2 oz (52 kg)] 114 lb 10.2 oz (52 kg) (11/03 0500) Last BM Date: 07/19/14 Filed Weights   07/20/14 0500 07/20/14 0629 07/21/14 0500  Weight: 113 lb 5.1 oz (51.4 kg) 113 lb 5.1 oz (51.4 kg) 114 lb 10.2 oz (52 kg)   General: pleasant, looks better but still frail   Heart: Irreg irreg, afib into 130s at present.  Chest: Clear.  Clear sputum with cough.  Abdomen: soft, NT, ND.  Active BS  Extremities: no CCE Neuro/Psych:  Pleasant, relaxed, oriented x 3 and alert  Intake/Output from previous day: 11/02 0701 - 11/03 0700 In: 1204.8 [P.O.:480; I.V.:724.8] Out: 1075 [Urine:1075]  Intake/Output this shift: Total I/O In: -  Out: 150 [Urine:150]  Lab Results:  Recent Labs  07/20/14 0228 07/21/14 0111 07/21/14 0902  WBC 12.6* 16.8* 18.7*  HGB 10.7* 11.4* 11.8*  HCT 33.1* 34.6* 35.7*  PLT 267 308 330   BMET  Recent Labs  07/18/14 1820 07/19/14 0937 07/21/14 0111  NA 141 140 138  K 3.9 4.0 3.8  CL 103 103 102  CO2 15* 18* 19  GLUCOSE 366* 197* 99  BUN 18 25* 32*  CREATININE 0.78 1.11* 0.77  CALCIUM 8.4 8.6 8.5   LFT  Recent Labs  07/20/14 0228 07/21/14 0111  PROT 6.1 6.4  ALBUMIN 2.6* 2.7*  AST 727* 565*  ALT 608* 788*  ALKPHOS 89 98  BILITOT 0.5 0.6  BILIDIR <0.2  --   IBILI NOT CALCULATED  --    Lipase    18  PT/INR  Recent Labs  07/20/14 0922  07/21/14 0111  LABPROT 20.9* 21.2*  INR 1.78* 1.82*   Hepatitis Panel  Recent Labs  07/20/14 0922  HEPBSAG NEGATIVE  HCVAB NEGATIVE  HEPAIGM NON REACTIVE  HEPBIGM NON REACTIVE    Studies/Results: US Abdomen Port  07/20/2014   CLINICAL DATA:  Diffuse gallbladder wall thickening on a recent abdomen CTA.  EXAM: ULTRASOUND PORTABLE ABDOMEN  COMPARISON:  Abdomen CTA dated 07/18/2014.  FINDINGS: Gallbladder: No gallstones or wall thickening visualized. No sonographic Murphy sign noted.  Common bile duct: Diameter: 6.5 mm  Liver: No focal lesion identified. Within normal limits in parenchymal echogenicity.  IVC: No abnormality visualized.  Pancreas: Visualized portion unremarkable.  Spleen: Size and appearance within normal limits.  Right Kidney: Length: 10.4 cm. Echogenicity within normal limits. No mass or hydronephrosis visualized.  Left Kidney: Length: 10.1 cm. Echogenicity within normal limits. No mass or hydronephrosis visualized.  Abdominal aorta: No aneurysm visualized.  Other findings: Small bilateral pleural effusions.  IMPRESSION: 1. The gallbladder has a normal appearance today with no gallstones or wall thickening. 2. Small bilateral pleural effusions.   Electronically Signed   By: Enrique Sack M.D.   On:  07/20/2014 17:05    ASSESMENT:   * Transaminitis with pt c/o scapular and back pain, GB wall thick on CT angio of chest.  Ultrasound shows normal looking GB and ducts. Lipase normal, so no evidence to support GB, biliary tree or pancreatic etiology. Acute hepatitis panel negative as expected. ? Is this shock liver from hypoperfusion and or drug (doxycycline) induced hepatitis?  LFTs are improving.   *  coagulopathy.  Slightly increased. On no bood thinners PTA  * SOB and upper back pain. Ruled in for non STEMI. Previous MI 1989 treated with TPA. S/p Cardiac stenting.  Cardiac cath on hold due to abnormal LFTs. On Heparin drip.   * URI. Started Doxy 10/28 for 5 days.  Course was interrupted at admission but restarted x 2 doses 11/2, then discontinued and switched to Augmentin.    PLAN   *  Start with  Clears, advance as tolerated.  Ok to have cream in her coffee. Continue PPI.  *  If anorexia continues even as LFTs normalize, consider EGD but the anorexia is only present since admission and may improve with resolution of hepatitis. cmet in AM.  *  Should we give her more vitamin K? See that she got one 2.5 mg oral dose yesterday. Repeat coags in am.     Azucena Freed  07/21/2014, 10:34 AM Pager: [phone]

## 2014-07-22 ENCOUNTER — Encounter (HOSPITAL_COMMUNITY): Payer: Self-pay | Admitting: Interventional Cardiology

## 2014-07-22 ENCOUNTER — Encounter (HOSPITAL_COMMUNITY)
Admission: EM | Disposition: A | Discharge status: Home / Self Care | Payer: Self-pay | Source: Home / Self Care | Attending: Cardiology

## 2014-07-22 DIAGNOSIS — I4892 Unspecified atrial flutter: Secondary | ICD-10-CM

## 2014-07-22 DIAGNOSIS — I4891 Unspecified atrial fibrillation: Secondary | ICD-10-CM | POA: Diagnosis present

## 2014-07-22 DIAGNOSIS — I2510 Atherosclerotic heart disease of native coronary artery without angina pectoris: Secondary | ICD-10-CM

## 2014-07-22 DIAGNOSIS — I480 Paroxysmal atrial fibrillation: Secondary | ICD-10-CM

## 2014-07-22 HISTORY — PX: CARDIAC CATHETERIZATION: SHX172

## 2014-07-22 HISTORY — PX: LEFT HEART CATHETERIZATION WITH CORONARY ANGIOGRAM: SHX5451

## 2014-07-22 LAB — COMPREHENSIVE METABOLIC PANEL
ALT: 579 U/L — AB (ref 0–35)
AST: 206 U/L — ABNORMAL HIGH (ref 0–37)
Albumin: 2.9 g/dL — ABNORMAL LOW (ref 3.5–5.2)
Alkaline Phosphatase: 105 U/L (ref 39–117)
Anion gap: 15 (ref 5–15)
BILIRUBIN TOTAL: 0.7 mg/dL (ref 0.3–1.2)
BUN: 22 mg/dL (ref 6–23)
CALCIUM: 8.8 mg/dL (ref 8.4–10.5)
CHLORIDE: 101 meq/L (ref 96–112)
CO2: 21 meq/L (ref 19–32)
Creatinine, Ser: 0.64 mg/dL (ref 0.50–1.10)
GFR, EST NON AFRICAN AMERICAN: 79 mL/min — AB (ref >90)
GLUCOSE: 137 mg/dL — AB (ref 70–99)
Potassium: 3.6 mEq/L — ABNORMAL LOW (ref 3.7–5.3)
SODIUM: 137 meq/L (ref 137–147)
Total Protein: 6.8 g/dL (ref 6.0–8.3)

## 2014-07-22 LAB — GLUCOSE, CAPILLARY
GLUCOSE-CAPILLARY: 164 mg/dL — AB (ref 70–99)
GLUCOSE-CAPILLARY: 208 mg/dL — AB (ref 70–99)
Glucose-Capillary: 150 mg/dL — ABNORMAL HIGH (ref 70–99)
Glucose-Capillary: 138 mg/dL — ABNORMAL HIGH (ref 70–99)

## 2014-07-22 LAB — CBC WITH DIFFERENTIAL/PLATELET
BASOS ABS: 0 10*3/uL (ref 0.0–0.1)
BASOS PCT: 0 % (ref 0–1)
EOS ABS: 0.1 10*3/uL (ref 0.0–0.7)
EOS PCT: 0 % (ref 0–5)
HEMATOCRIT: 35.1 % — AB (ref 36.0–46.0)
Hemoglobin: 11.7 g/dL — ABNORMAL LOW (ref 12.0–15.0)
Lymphocytes Relative: 11 % — ABNORMAL LOW (ref 12–46)
Lymphs Abs: 1.6 10*3/uL (ref 0.7–4.0)
MCH: 29 pg (ref 26.0–34.0)
MCHC: 33.3 g/dL (ref 30.0–36.0)
MCV: 86.9 fL (ref 78.0–100.0)
MONO ABS: 1 10*3/uL (ref 0.1–1.0)
MONOS PCT: 7 % (ref 3–12)
NEUTROS ABS: 11.8 10*3/uL — AB (ref 1.7–7.7)
Neutrophils Relative %: 82 % — ABNORMAL HIGH (ref 43–77)
Platelets: 356 10*3/uL (ref 150–400)
RBC: 4.04 MIL/uL (ref 3.87–5.11)
RDW: 13.9 % (ref 11.5–15.5)
WBC: 14.4 10*3/uL — ABNORMAL HIGH (ref 4.0–10.5)

## 2014-07-22 LAB — PROTIME-INR
INR: 1.44 (ref 0.00–1.49)
Prothrombin Time: 17.6 seconds — ABNORMAL HIGH (ref 11.6–15.2)

## 2014-07-22 LAB — HEPARIN LEVEL (UNFRACTIONATED): Heparin Unfractionated: 0.49 IU/mL (ref 0.30–0.70)

## 2014-07-22 SURGERY — LEFT HEART CATHETERIZATION WITH CORONARY ANGIOGRAM
Anesthesia: LOCAL

## 2014-07-22 MED ORDER — MIDAZOLAM HCL 2 MG/2ML IJ SOLN
INTRAMUSCULAR | Status: AC
  Filled 2014-07-22: qty 2

## 2014-07-22 MED ORDER — HEPARIN (PORCINE) IN NACL 2-0.9 UNIT/ML-% IJ SOLN
INTRAMUSCULAR | Status: AC
Start: 2014-07-22 — End: 2014-07-22
  Filled 2014-07-22: qty 1500

## 2014-07-22 MED ORDER — NITROGLYCERIN 1 MG/10 ML FOR IR/CATH LAB
INTRA_ARTERIAL | Status: AC
  Filled 2014-07-22: qty 10

## 2014-07-22 MED ORDER — SODIUM CHLORIDE 0.9 % IV SOLN
INTRAVENOUS | Status: AC
  Administered 2014-07-22: 18:00:00 via INTRAVENOUS

## 2014-07-22 MED ORDER — CLOPIDOGREL BISULFATE 75 MG PO TABS
75.0000 mg | ORAL_TABLET | Freq: Every day | ORAL | Status: DC
  Administered 2014-07-22 – 2014-07-29 (×8): 75 mg via ORAL
  Filled 2014-07-22 (×9): qty 1

## 2014-07-22 MED ORDER — ASPIRIN 81 MG PO CHEW: 81.0000 mg | CHEWABLE_TABLET | ORAL | Status: DC

## 2014-07-22 MED ORDER — SODIUM CHLORIDE 0.9 % IJ SOLN
3.0000 mL | Freq: Two times a day (BID) | INTRAMUSCULAR | Status: DC
  Administered 2014-07-22: 3 mL via INTRAVENOUS

## 2014-07-22 MED ORDER — SODIUM CHLORIDE 0.9 % IV SOLN: 250.0000 mL | INTRAVENOUS | Status: DC | PRN

## 2014-07-22 MED ORDER — VERAPAMIL HCL 2.5 MG/ML IV SOLN
INTRAVENOUS | Status: AC
  Filled 2014-07-22: qty 2

## 2014-07-22 MED ORDER — SODIUM CHLORIDE 0.9 % IV SOLN
INTRAVENOUS | Status: DC
  Administered 2014-07-22: 13:00:00 via INTRAVENOUS

## 2014-07-22 MED ORDER — SODIUM CHLORIDE 0.9 % IJ SOLN: 3.0000 mL | INTRAMUSCULAR | Status: DC | PRN

## 2014-07-22 MED ORDER — HEPARIN SODIUM (PORCINE) 1000 UNIT/ML IJ SOLN
INTRAMUSCULAR | Status: AC
  Filled 2014-07-22: qty 1

## 2014-07-22 MED ORDER — HEPARIN (PORCINE) IN NACL 100-0.45 UNIT/ML-% IJ SOLN
950.0000 [IU]/h | INTRAMUSCULAR | Status: DC
  Administered 2014-07-23 – 2014-07-24 (×3): 1100 [IU]/h via INTRAVENOUS
  Administered 2014-07-26: 1000 [IU]/h via INTRAVENOUS
  Filled 2014-07-22 (×7): qty 250

## 2014-07-22 MED ORDER — LIDOCAINE HCL (PF) 1 % IJ SOLN
INTRAMUSCULAR | Status: AC
  Filled 2014-07-22: qty 30

## 2014-07-22 MED ORDER — OXYCODONE-ACETAMINOPHEN 5-325 MG PO TABS: 1.0000 | ORAL_TABLET | ORAL | Status: DC | PRN

## 2014-07-22 MED ORDER — FENTANYL CITRATE 0.05 MG/ML IJ SOLN
INTRAMUSCULAR | Status: AC
  Filled 2014-07-22: qty 2

## 2014-07-22 NOTE — Progress Notes (Signed)
ANTICOAGULATION CONSULT NOTE - Follow Up Consult  Pharmacy Consult for heparin  Indication: chest pain/ACS  Allergies  Allergen Reactions  . Alendronate Sodium   . Isosorbide   . Metformin And Related     Decreases appetite resulting in weight loss  . Metronidazole     REACTION: sick/ GI  . Nifedipine     Patient Measurements: Height: 5\' 2"  (157.5 cm) Weight: 117 lb 6.4 oz (53.252 kg) IBW/kg (Calculated) : 50.1 Heparin Dosing Weight: 52kg  Vital Signs: Temp: 98 F (36.7 C) (11/04 1604) Temp Source: Oral (11/04 1604) BP: 165/60 mmHg (11/04 1816) Pulse Rate: 76 (11/04 1816)  Labs:  Recent Labs  07/20/14 0922  07/21/14 0111 07/21/14 0902 07/21/14 1032 07/21/14 1850 07/22/14 0231  HGB  --   --  11.4* 11.8*  --   --  11.7*  HCT  --   --  34.6* 35.7*  --   --  35.1*  PLT  --   --  308 330  --   --  356  LABPROT 20.9*  --  21.2*  --   --   --  17.6*  INR 1.78*  --  1.82*  --   --   --  1.44  HEPARINUNFRC  --   < > 0.20*  --  0.43 0.34 0.49  CREATININE  --   --  0.77  --   --   --  0.64  < > = values in this interval not displayed.  Estimated Creatinine Clearance: 40.7 mL/min (by C-G formula based on Cr of 0.64).  Assessment: 72 YOF admitted on 10/31, started on heparin infusion for ACS/NSTEMI, now s/p cath which found heavily calcified right and left coronary arteries. Will continue to follow for symptoms and if recur, she may need angioplasty per cath note by Dr. Tamala Julian. She was therapeutic on heparin at 1100 units/hr. Sheath was removed at 1740. No overt bleeding noted post-cath.  Goal of Therapy:  Heparin level 0.3-0.7 units/ml Monitor platelets by anticoagulation protocol: Yes   Plan:  1. Resume heparin at1100 units/hr at 0200 2. HL 8 hours after start at 1000 3. Daily HL and CBC 4. Follow for s/s bleeding and interventional plans  Kathaleya Mcduffee D. Eros Montour, PharmD, BCPS Clinical Pharmacist Pager: [phone] 07/22/2014 6:24 PM

## 2014-07-22 NOTE — Interval H&P Note (Signed)
Cath Lab Visit (complete for each Cath Lab visit)  Clinical Evaluation Leading to the Procedure:   ACS: Yes.    Non-ACS:    Anginal Classification: CCS III  Anti-ischemic medical therapy: Minimal Therapy (1 class of medications)  Non-Invasive Test Results: No non-invasive testing performed  Prior CABG: No previous CABG      History and Physical Interval Note:  07/22/2014 5:05 PM  Bianca Mueller  has presented today for surgery, with the diagnosis of cp  The various methods of treatment have been discussed with the patient and family. After consideration of risks, benefits and other options for treatment, the patient has consented to  Procedure(s): LEFT HEART CATHETERIZATION WITH CORONARY ANGIOGRAM (N/A) as a surgical intervention .  The patient's history has been reviewed, patient examined, no change in status, stable for surgery.  I have reviewed the patient's chart and labs.  Questions were answered to the patient's satisfaction.     Sinclair Grooms

## 2014-07-22 NOTE — H&P (View-Only) (Signed)
SUBJECTIVE:  No nausea overnight, no further chest pain or discomfort, overall feeling better.  Afebrile overnight  OBJECTIVE:   Vitals:   Filed Vitals:   07/22/14 0400 07/22/14 0500 07/22/14 0600 07/22/14 0700  BP: 146/65 159/85 145/74 133/72  Pulse: 111 112 113 104  Temp:      TempSrc:      Resp: 15 18 17 14   Height:      Weight:      SpO2: 95% 96% 97% 97%   I&O's:    Intake/Output Summary (Last 24 hours) at 07/22/14 0742 Last data filed at 07/22/14 0600  Gross per 24 hour  Intake 1294.58 ml  Output   1050 ml  Net 244.58 ml   TELEMETRY: Reviewed telemetry pt in a fib with rate ~120s:  PHYSICAL EXAM  General: frail elderly female Head:   Normal cephalic and atraumatic Lungs:  Clear bilaterally to auscultation. Heart:  irregularly irregular tachycardiac, No JVD.   Abdomen: abdomen soft and non-tender Msk:  Back normal,  Normal strength and tone for age. Extremities:  No edema.   Neuro: Alert and oriented. Psych:  Normal affect, responds appropriately   LABS: Basic Metabolic Panel:  Recent Labs  07/21/14 0111 07/22/14 0231  NA 138 137  K 3.8 3.6*  CL 102 101  CO2 19 21  GLUCOSE 99 137*  BUN 32* 22  CREATININE 0.77 0.64  CALCIUM 8.5 8.8   Liver Function Tests:  Recent Labs  07/21/14 0111 07/22/14 0231  AST 565* 206*  ALT 788* 579*  ALKPHOS 98 105  BILITOT 0.6 0.7  PROT 6.4 6.8  ALBUMIN 2.7* 2.9*    Recent Labs  07/21/14 0111  LIPASE 18   CBC:  Recent Labs  07/21/14 0902 07/22/14 0231  WBC 18.7* 14.4*  NEUTROABS 16.6* 11.8*  HGB 11.8* 11.7*  HCT 35.7* 35.1*  MCV 87.3 86.9  PLT 330 356   Cardiac Enzymes:  Recent Labs  07/19/14 0937  TROPONINI 2.73*   BNP: Invalid input(s): POCBNP D-Dimer: No results for input(s): DDIMER in the last 72 hours. Hemoglobin A1C: No results for input(s): HGBA1C in the last 72 hours. Fasting Lipid Panel:  Recent Labs  07/19/14 0937  CHOL 84  HDL 24*  LDLCALC 41  TRIG 94  CHOLHDL 3.5     Thyroid Function Tests: No results for input(s): TSH, T4TOTAL, T3FREE, THYROIDAB in the last 72 hours.  Invalid input(s): FREET3 Anemia Panel: No results for input(s): VITAMINB12, FOLATE, FERRITIN, TIBC, IRON, RETICCTPCT in the last 72 hours. Coag Panel:   Lab Results  Component Value Date   INR 1.44 07/22/2014   INR 1.82* 07/21/2014   INR 1.78* 07/20/2014    RADIOLOGY: US Abdomen Port  07/20/2014   CLINICAL DATA:  Diffuse gallbladder wall thickening on a recent abdomen CTA.  EXAM: ULTRASOUND PORTABLE ABDOMEN  COMPARISON:  Abdomen CTA dated 07/18/2014.  FINDINGS: Gallbladder: No gallstones or wall thickening visualized. No sonographic Murphy sign noted.  Common bile duct: Diameter: 6.5 mm  Liver: No focal lesion identified. Within normal limits in parenchymal echogenicity.  IVC: No abnormality visualized.  Pancreas: Visualized portion unremarkable.  Spleen: Size and appearance within normal limits.  Right Kidney: Length: 10.4 cm. Echogenicity within normal limits. No mass or hydronephrosis visualized.  Left Kidney: Length: 10.1 cm. Echogenicity within normal limits. No mass or hydronephrosis visualized.  Abdominal aorta: No aneurysm visualized.  Other findings: Small bilateral pleural effusions.  IMPRESSION: 1. The gallbladder has a normal appearance today with no  gallstones or wall thickening. 2. Small bilateral pleural effusions.   Electronically Signed   By: Enrique Sack M.D.   On: 07/20/2014 17:05   Dg Chest Port 1 View  07/18/2014   CLINICAL DATA:  Nausea vomiting and decreased oxygen saturation. Former smoker.  EXAM: PORTABLE CHEST - 1 VIEW  COMPARISON:  04/04/2014.  FINDINGS: Mild cardiac enlargement. Atherosclerotic disease is noted within the aortic arch. There are 2 nodular densities in the left base that are indeterminate. No pleural effusion identified. Right lung is clear.  IMPRESSION: 1. Two nodular opacities are identified in the left base. In this patient who has a history of  smoking a initial further workup with a dedicated PA and lateral upright radiographs are recommended.   Electronically Signed   By: Kerby Moors M.D.   On: 07/18/2014 19:25   Ct Angio Chest Aorta W/cm &/or Wo/cm  07/18/2014   CLINICAL DATA:  Nausea and vomiting.  Intermittent upper back pain.  EXAM: CT ANGIOGRAPHY CHEST, ABDOMEN AND PELVIS  TECHNIQUE: Multidetector CT imaging through the chest, abdomen and pelvis was performed using the standard protocol during bolus administration of intravenous contrast. Multiplanar reconstructed images and MIPs were obtained and reviewed to evaluate the vascular anatomy.  CONTRAST:  180m OMNIPAQUE IOHEXOL 350 MG/ML SOLN  COMPARISON:  None.  FINDINGS: CTA CHEST FINDINGS  Mediastinum: The heart size is normal. There is a small to moderate pericardial effusion identified. There is calcified atherosclerotic disease involving the thoracic aorta. No evidence for 8 dissection. The main pulmonary artery appears patent there is no abnormal filling defects within the main pulmonary artery or its branches.  Lungs/Pleura: There is atelectasis/ scarring identified within the lung bases. A calcified granuloma is noted in the left base. No airspace consolidation.  Musculoskeletal: There is mild spondylosis identified within the thoracic spine.  Review of the MIP images confirms the above findings.  CTA ABDOMEN AND PELVIS FINDINGS  Hepatobiliary: Normal appearance of the liver. There is diffuse gallbladder wall thickening measuring up to 8 mm. There is no biliary dilatation.  Pancreas: Normal appearance of the pancreas.  Spleen: The spleen is unremarkable.  Adrenals/Urinary Tract: The adrenal glands are both normal. There is mild bilateral renal cortical thinning. The urinary bladder is within normal limits.  Stomach/Bowel: Small hiatal hernia. The stomach is otherwise unremarkable. The small bowel loops have a normal course and caliber without obstruction. The appendix is visualized and  appears normal. Multiple distal colonic diverticula identified without acute inflammation.  Vascular/Lymphatic: There is calcified atherosclerotic disease involving the normal caliber abdominal aorta. There is no dissection. No retroperitoneal adenopathy.  Reproductive: Previous hysterectomy. The adnexal structures appear normal.  Other: There is no free fluid or fluid collections within the abdomen or pelvis.  Musculoskeletal: Review of the visualized osseous structures is significant for degenerative disc disease within the L4-5 level.  Review of the MIP images confirms the above findings.  IMPRESSION: 1. No evidence for aortic dissection. 2. Atherosclerotic disease. 3. Gallbladder wall thickening. If there are clinical signs or symptoms of cholecystitis consider further investigation with abdominal sonogram. 4. Hiatal hernia 5. Thoracic and lumbar degenerative disc disease.   Electronically Signed   By: TKerby MoorsM.D.   On: 07/18/2014 20:35   Ct Cta Abd/pel W/cm &/or W/o Cm  07/18/2014   CLINICAL DATA:  Nausea and vomiting.  Intermittent upper back pain.  EXAM: CT ANGIOGRAPHY CHEST, ABDOMEN AND PELVIS  TECHNIQUE: Multidetector CT imaging through the chest, abdomen and pelvis was performed  using the standard protocol during bolus administration of intravenous contrast. Multiplanar reconstructed images and MIPs were obtained and reviewed to evaluate the vascular anatomy.  CONTRAST:  132m OMNIPAQUE IOHEXOL 350 MG/ML SOLN  COMPARISON:  None.  FINDINGS: CTA CHEST FINDINGS  Mediastinum: The heart size is normal. There is a small to moderate pericardial effusion identified. There is calcified atherosclerotic disease involving the thoracic aorta. No evidence for 8 dissection. The main pulmonary artery appears patent there is no abnormal filling defects within the main pulmonary artery or its branches.  Lungs/Pleura: There is atelectasis/ scarring identified within the lung bases. A calcified granuloma is noted  in the left base. No airspace consolidation.  Musculoskeletal: There is mild spondylosis identified within the thoracic spine.  Review of the MIP images confirms the above findings.  CTA ABDOMEN AND PELVIS FINDINGS  Hepatobiliary: Normal appearance of the liver. There is diffuse gallbladder wall thickening measuring up to 8 mm. There is no biliary dilatation.  Pancreas: Normal appearance of the pancreas.  Spleen: The spleen is unremarkable.  Adrenals/Urinary Tract: The adrenal glands are both normal. There is mild bilateral renal cortical thinning. The urinary bladder is within normal limits.  Stomach/Bowel: Small hiatal hernia. The stomach is otherwise unremarkable. The small bowel loops have a normal course and caliber without obstruction. The appendix is visualized and appears normal. Multiple distal colonic diverticula identified without acute inflammation.  Vascular/Lymphatic: There is calcified atherosclerotic disease involving the normal caliber abdominal aorta. There is no dissection. No retroperitoneal adenopathy.  Reproductive: Previous hysterectomy. The adnexal structures appear normal.  Other: There is no free fluid or fluid collections within the abdomen or pelvis.  Musculoskeletal: Review of the visualized osseous structures is significant for degenerative disc disease within the L4-5 level.  Review of the MIP images confirms the above findings.  IMPRESSION: 1. No evidence for aortic dissection. 2. Atherosclerotic disease. 3. Gallbladder wall thickening. If there are clinical signs or symptoms of cholecystitis consider further investigation with abdominal sonogram. 4. Hiatal hernia 5. Thoracic and lumbar degenerative disc disease.   Electronically Signed   By: TKerby MoorsM.D.   On: 07/18/2014 20:35      ASSESSMENT:  NSTEMI Atrial Flutter Transaminitis (normal alk phos) HTN T2DM Leukocytosis (Trending down)/ Acute Sinusitus Nausea   PLAN:   -NSTEMI>>> INR down to 1.4 will be scheduled  for cath today  - Atrial Flutter/ A fib >>> diltiazem drip  -Transaminitis >> holding statin, LFTs improving.  Most likely explanation is drug induced hepatitis from doxycycline which has now been discontinued.  -HTN >> BP 132/72 this AM. cardiazem drip and metoprolol 12.5 BID.    - T2DM >>  Mildly hyperglycemic, eating more.  May need some long acting insulin if continues to improve PO intake  - Sinusitus>>> augmentin will treat for 5 days  - Leukocytosis>> suspect due to sinusitis and improving. Differential shows now eosinophilia.  -Nausea>> resolved Zofran prn   HHerbie [id number] Pager: [phone]/12/2013  7:42 AM   I have examined the patient and reviewed assessment and plan and discussed with patient.  Agree with above as stated.  INR better today.  OK for cath.  OK for DES as hepatitis is better.  ? Doxycycline as cause of hepatitis since that was the only new medicine introduced.   NSR returned but now with Atrial fibrillation with RVR.  Increase diltiazem as BP allows. Would have to consider long term anticoagulation.  If stent needed today, would likely  use Plavix and COumadin together.   Tayon Parekh S.

## 2014-07-22 NOTE — Progress Notes (Signed)
SUBJECTIVE:  No nausea overnight, no further chest pain or discomfort, overall feeling better.  Afebrile overnight  OBJECTIVE:   Vitals:   Filed Vitals:   07/22/14 0400 07/22/14 0500 07/22/14 0600 07/22/14 0700  BP: 146/65 159/85 145/74 133/72  Pulse: 111 112 113 104  Temp:      TempSrc:      Resp: 15 18 17 14   Height:      Weight:      SpO2: 95% 96% 97% 97%   I&O's:    Intake/Output Summary (Last 24 hours) at 07/22/14 0742 Last data filed at 07/22/14 0600  Gross per 24 hour  Intake 1294.58 ml  Output   1050 ml  Net 244.58 ml   TELEMETRY: Reviewed telemetry pt in a fib with rate ~120s:  PHYSICAL EXAM  General: frail elderly female Head:   Normal cephalic and atraumatic Lungs:  Clear bilaterally to auscultation. Heart:  irregularly irregular tachycardiac, No JVD.   Abdomen: abdomen soft and non-tender Msk:  Back normal,  Normal strength and tone for age. Extremities:  No edema.   Neuro: Alert and oriented. Psych:  Normal affect, responds appropriately   LABS: Basic Metabolic Panel:  Recent Labs  07/21/14 0111 07/22/14 0231  NA 138 137  K 3.8 3.6*  CL 102 101  CO2 19 21  GLUCOSE 99 137*  BUN 32* 22  CREATININE 0.77 0.64  CALCIUM 8.5 8.8   Liver Function Tests:  Recent Labs  07/21/14 0111 07/22/14 0231  AST 565* 206*  ALT 788* 579*  ALKPHOS 98 105  BILITOT 0.6 0.7  PROT 6.4 6.8  ALBUMIN 2.7* 2.9*    Recent Labs  07/21/14 0111  LIPASE 18   CBC:  Recent Labs  07/21/14 0902 07/22/14 0231  WBC 18.7* 14.4*  NEUTROABS 16.6* 11.8*  HGB 11.8* 11.7*  HCT 35.7* 35.1*  MCV 87.3 86.9  PLT 330 356   Cardiac Enzymes:  Recent Labs  07/19/14 0937  TROPONINI 2.73*   BNP: Invalid input(s): POCBNP D-Dimer: No results for input(s): DDIMER in the last 72 hours. Hemoglobin A1C: No results for input(s): HGBA1C in the last 72 hours. Fasting Lipid Panel:  Recent Labs  07/19/14 0937  CHOL 84  HDL 24*  LDLCALC 41  TRIG 94  CHOLHDL 3.5     Thyroid Function Tests: No results for input(s): TSH, T4TOTAL, T3FREE, THYROIDAB in the last 72 hours.  Invalid input(s): FREET3 Anemia Panel: No results for input(s): VITAMINB12, FOLATE, FERRITIN, TIBC, IRON, RETICCTPCT in the last 72 hours. Coag Panel:   Lab Results  Component Value Date   INR 1.44 07/22/2014   INR 1.82* 07/21/2014   INR 1.78* 07/20/2014    RADIOLOGY: US Abdomen Port  07/20/2014   CLINICAL DATA:  Diffuse gallbladder wall thickening on a recent abdomen CTA.  EXAM: ULTRASOUND PORTABLE ABDOMEN  COMPARISON:  Abdomen CTA dated 07/18/2014.  FINDINGS: Gallbladder: No gallstones or wall thickening visualized. No sonographic Murphy sign noted.  Common bile duct: Diameter: 6.5 mm  Liver: No focal lesion identified. Within normal limits in parenchymal echogenicity.  IVC: No abnormality visualized.  Pancreas: Visualized portion unremarkable.  Spleen: Size and appearance within normal limits.  Right Kidney: Length: 10.4 cm. Echogenicity within normal limits. No mass or hydronephrosis visualized.  Left Kidney: Length: 10.1 cm. Echogenicity within normal limits. No mass or hydronephrosis visualized.  Abdominal aorta: No aneurysm visualized.  Other findings: Small bilateral pleural effusions.  IMPRESSION: 1. The gallbladder has a normal appearance today with no  gallstones or wall thickening. 2. Small bilateral pleural effusions.   Electronically Signed   By: Enrique Sack M.D.   On: 07/20/2014 17:05   Dg Chest Port 1 View  07/18/2014   CLINICAL DATA:  Nausea vomiting and decreased oxygen saturation. Former smoker.  EXAM: PORTABLE CHEST - 1 VIEW  COMPARISON:  04/04/2014.  FINDINGS: Mild cardiac enlargement. Atherosclerotic disease is noted within the aortic arch. There are 2 nodular densities in the left base that are indeterminate. No pleural effusion identified. Right lung is clear.  IMPRESSION: 1. Two nodular opacities are identified in the left base. In this patient who has a history of  smoking a initial further workup with a dedicated PA and lateral upright radiographs are recommended.   Electronically Signed   By: Kerby Moors M.D.   On: 07/18/2014 19:25   Ct Angio Chest Aorta W/cm &/or Wo/cm  07/18/2014   CLINICAL DATA:  Nausea and vomiting.  Intermittent upper back pain.  EXAM: CT ANGIOGRAPHY CHEST, ABDOMEN AND PELVIS  TECHNIQUE: Multidetector CT imaging through the chest, abdomen and pelvis was performed using the standard protocol during bolus administration of intravenous contrast. Multiplanar reconstructed images and MIPs were obtained and reviewed to evaluate the vascular anatomy.  CONTRAST:  163m OMNIPAQUE IOHEXOL 350 MG/ML SOLN  COMPARISON:  None.  FINDINGS: CTA CHEST FINDINGS  Mediastinum: The heart size is normal. There is a small to moderate pericardial effusion identified. There is calcified atherosclerotic disease involving the thoracic aorta. No evidence for 8 dissection. The main pulmonary artery appears patent there is no abnormal filling defects within the main pulmonary artery or its branches.  Lungs/Pleura: There is atelectasis/ scarring identified within the lung bases. A calcified granuloma is noted in the left base. No airspace consolidation.  Musculoskeletal: There is mild spondylosis identified within the thoracic spine.  Review of the MIP images confirms the above findings.  CTA ABDOMEN AND PELVIS FINDINGS  Hepatobiliary: Normal appearance of the liver. There is diffuse gallbladder wall thickening measuring up to 8 mm. There is no biliary dilatation.  Pancreas: Normal appearance of the pancreas.  Spleen: The spleen is unremarkable.  Adrenals/Urinary Tract: The adrenal glands are both normal. There is mild bilateral renal cortical thinning. The urinary bladder is within normal limits.  Stomach/Bowel: Small hiatal hernia. The stomach is otherwise unremarkable. The small bowel loops have a normal course and caliber without obstruction. The appendix is visualized and  appears normal. Multiple distal colonic diverticula identified without acute inflammation.  Vascular/Lymphatic: There is calcified atherosclerotic disease involving the normal caliber abdominal aorta. There is no dissection. No retroperitoneal adenopathy.  Reproductive: Previous hysterectomy. The adnexal structures appear normal.  Other: There is no free fluid or fluid collections within the abdomen or pelvis.  Musculoskeletal: Review of the visualized osseous structures is significant for degenerative disc disease within the L4-5 level.  Review of the MIP images confirms the above findings.  IMPRESSION: 1. No evidence for aortic dissection. 2. Atherosclerotic disease. 3. Gallbladder wall thickening. If there are clinical signs or symptoms of cholecystitis consider further investigation with abdominal sonogram. 4. Hiatal hernia 5. Thoracic and lumbar degenerative disc disease.   Electronically Signed   By: TKerby MoorsM.D.   On: 07/18/2014 20:35   Ct Cta Abd/pel W/cm &/or W/o Cm  07/18/2014   CLINICAL DATA:  Nausea and vomiting.  Intermittent upper back pain.  EXAM: CT ANGIOGRAPHY CHEST, ABDOMEN AND PELVIS  TECHNIQUE: Multidetector CT imaging through the chest, abdomen and pelvis was performed  using the standard protocol during bolus administration of intravenous contrast. Multiplanar reconstructed images and MIPs were obtained and reviewed to evaluate the vascular anatomy.  CONTRAST:  172m OMNIPAQUE IOHEXOL 350 MG/ML SOLN  COMPARISON:  None.  FINDINGS: CTA CHEST FINDINGS  Mediastinum: The heart size is normal. There is a small to moderate pericardial effusion identified. There is calcified atherosclerotic disease involving the thoracic aorta. No evidence for 8 dissection. The main pulmonary artery appears patent there is no abnormal filling defects within the main pulmonary artery or its branches.  Lungs/Pleura: There is atelectasis/ scarring identified within the lung bases. A calcified granuloma is noted  in the left base. No airspace consolidation.  Musculoskeletal: There is mild spondylosis identified within the thoracic spine.  Review of the MIP images confirms the above findings.  CTA ABDOMEN AND PELVIS FINDINGS  Hepatobiliary: Normal appearance of the liver. There is diffuse gallbladder wall thickening measuring up to 8 mm. There is no biliary dilatation.  Pancreas: Normal appearance of the pancreas.  Spleen: The spleen is unremarkable.  Adrenals/Urinary Tract: The adrenal glands are both normal. There is mild bilateral renal cortical thinning. The urinary bladder is within normal limits.  Stomach/Bowel: Small hiatal hernia. The stomach is otherwise unremarkable. The small bowel loops have a normal course and caliber without obstruction. The appendix is visualized and appears normal. Multiple distal colonic diverticula identified without acute inflammation.  Vascular/Lymphatic: There is calcified atherosclerotic disease involving the normal caliber abdominal aorta. There is no dissection. No retroperitoneal adenopathy.  Reproductive: Previous hysterectomy. The adnexal structures appear normal.  Other: There is no free fluid or fluid collections within the abdomen or pelvis.  Musculoskeletal: Review of the visualized osseous structures is significant for degenerative disc disease within the L4-5 level.  Review of the MIP images confirms the above findings.  IMPRESSION: 1. No evidence for aortic dissection. 2. Atherosclerotic disease. 3. Gallbladder wall thickening. If there are clinical signs or symptoms of cholecystitis consider further investigation with abdominal sonogram. 4. Hiatal hernia 5. Thoracic and lumbar degenerative disc disease.   Electronically Signed   By: TKerby MoorsM.D.   On: 07/18/2014 20:35      ASSESSMENT:  NSTEMI Atrial Flutter Transaminitis (normal alk phos) HTN T2DM Leukocytosis (Trending down)/ Acute Sinusitus Nausea   PLAN:   -NSTEMI>>> INR down to 1.4 will be scheduled  for cath today  - Atrial Flutter/ A fib >>> diltiazem drip  -Transaminitis >> holding statin, LFTs improving.  Most likely explanation is drug induced hepatitis from doxycycline which has now been discontinued.  -HTN >> BP 132/72 this AM. cardiazem drip and metoprolol 12.5 BID.    - T2DM >>  Mildly hyperglycemic, eating more.  May need some long acting insulin if continues to improve PO intake  - Sinusitus>>> augmentin will treat for 5 days  - Leukocytosis>> suspect due to sinusitis and improving. Differential shows now eosinophilia.  -Nausea>> resolved Zofran prn   HHerbie [id number] Pager: [phone]/12/2013  7:42 AM   I have examined the patient and reviewed assessment and plan and discussed with patient.  Agree with above as stated.  INR better today.  OK for cath.  OK for DES as hepatitis is better.  ? Doxycycline as cause of hepatitis since that was the only new medicine introduced.   NSR returned but now with Atrial fibrillation with RVR.  Increase diltiazem as BP allows. Would have to consider long term anticoagulation.  If stent needed today, would likely  use Plavix and COumadin together.   Laquinn Shippy S.

## 2014-07-22 NOTE — Progress Notes (Signed)
Daily Rounding Note  07/22/2014, 8:16 AM  LOS: 4 days   SUBJECTIVE:       Last BM was 10/31.  No nausea, no abdominal pain.  Notices the arrythmia, heart races even if she just stands up.  No SOB or chest pain.  NPO for cardiac cath this AM.  Cough is improving, sputum is clear  OBJECTIVE:         Vital signs in last 24 hours:    Temp:  [97.2 F (36.2 C)-98.2 F (36.8 C)] 98.2 F (36.8 C) (11/04 0748) Pulse Rate:  [30-131] 104 (11/04 0700) Resp:  [14-24] 14 (11/04 0700) BP: (104-167)/(21-103) 133/72 mmHg (11/04 0700) SpO2:  [79 %-98 %] 97 % (11/04 0700) Weight:  [117 lb 6.4 oz (53.252 kg)] 117 lb 6.4 oz (53.252 kg) (11/04 0300) Last BM Date: 07/19/14 Filed Weights   07/20/14 0629 07/21/14 0500 07/22/14 0300  Weight: 113 lb 5.1 oz (51.4 kg) 114 lb 10.2 oz (52 kg) 117 lb 6.4 oz (53.252 kg)   General: pleasant, comfortable, somewhat frail   Heart: Irreg irreg, rate in 120s Chest: clear bil.  Not coughing Abdomen: soft, NT, ND, active BS  Extremities: no CCE Neuro/Psych:  Pleasant, relaxed, not confused.   Intake/Output from previous day: 11/03 0701 - 11/04 0700 In: 1294.6 [P.O.:600; I.V.:694.6] Out: 1050 [Urine:1050]  Intake/Output this shift:    Lab Results:  Recent Labs  07/21/14 0111 07/21/14 0902 07/22/14 0231  WBC 16.8* 18.7* 14.4*  HGB 11.4* 11.8* 11.7*  HCT 34.6* 35.7* 35.1*  PLT 308 330 356   BMET  Recent Labs  07/19/14 0937 07/21/14 0111 07/22/14 0231  NA 140 138 137  K 4.0 3.8 3.6*  CL 103 102 101  CO2 18* 19 21  GLUCOSE 197* 99 137*  BUN 25* 32* 22  CREATININE 1.11* 0.77 0.64  CALCIUM 8.6 8.5 8.8   LFT  Recent Labs  07/20/14 0228 07/21/14 0111 07/22/14 0231  PROT 6.1 6.4 6.8  ALBUMIN 2.6* 2.7* 2.9*  AST 727* 565* 206*  ALT 608* 788* 579*  ALKPHOS 89 98 105  BILITOT 0.5 0.6 0.7  BILIDIR <0.2  --   --   IBILI NOT CALCULATED  --   --    PT/INR  Recent Labs   07/21/14 0111 07/22/14 0231  LABPROT 21.2* 17.6*  INR 1.82* 1.44   Hepatitis Panel  Recent Labs  07/20/14 0922  HEPBSAG NEGATIVE  HCVAB NEGATIVE  HEPAIGM NON REACTIVE  HEPBIGM NON REACTIVE    Studies/Results: US Abdomen Port  07/20/2014   CLINICAL DATA:  Diffuse gallbladder wall thickening on a recent abdomen CTA.  EXAM: ULTRASOUND PORTABLE ABDOMEN  COMPARISON:  Abdomen CTA dated 07/18/2014.  FINDINGS: Gallbladder: No gallstones or wall thickening visualized. No sonographic Murphy sign noted.  Common bile duct: Diameter: 6.5 mm  Liver: No focal lesion identified. Within normal limits in parenchymal echogenicity.  IVC: No abnormality visualized.  Pancreas: Visualized portion unremarkable.  Spleen: Size and appearance within normal limits.  Right Kidney: Length: 10.4 cm. Echogenicity within normal limits. No mass or hydronephrosis visualized.  Left Kidney: Length: 10.1 cm. Echogenicity within normal limits. No mass or hydronephrosis visualized.  Abdominal aorta: No aneurysm visualized.  Other findings: Small bilateral pleural effusions.  IMPRESSION: 1. The gallbladder has a normal appearance today with no gallstones or wall thickening. 2. Small bilateral pleural effusions.   Electronically Signed   By: Enrique Sack M.D.   On: 07/20/2014  17:05   Scheduled Meds: . amoxicillin-clavulanate  1 tablet Oral Q12H  . antiseptic oral rinse  7 mL Mouth Rinse BID  . aspirin EC  81 mg Oral Daily  . insulin aspart  0-24 Units Subcutaneous TID WC  . metoprolol tartrate  12.5 mg Oral BID  . pantoprazole  40 mg Oral Daily  . sodium chloride  3 mL Intravenous Q12H   Continuous Infusions: . diltiazem (CARDIZEM) infusion 15 mg/hr (07/22/14 0532)  . heparin 1,100 Units/hr (07/22/14 0540)   PRN Meds:.sodium chloride, acetaminophen, fentaNYL, nitroGLYCERIN, ondansetron (ZOFRAN) IV, prochlorperazine, sodium chloride  ASSESMENT:   * Transaminitis with pt c/o scapular and back pain, GB wall thick on CT  angio of chest. Ultrasound shows normal looking GB and ducts. Lipase normal, so no evidence to support GB, biliary tree or pancreatic etiology. Acute hepatitis panel negative as expected. ? Is this shock liver from hypoperfusion and or drug (doxycycline) induced hepatitis?  LFTs steadily improving.   * coagulopathy. On no bood thinners PTA.  Improved.  On IV Heparin.   * SOB and upper back pain. Ruled in for non STEMI. Previous MI 1989 treated with TPA. S/p Cardiac stenting.  Cardiac cath on hold due to abnormal LFTs. On Heparin drip.   * URI. Started Doxy 10/28 for 5 days. Course was interrupted at admission but restarted x 2 doses 11/2, then discontinued and switched to Augmentin.  WBCs improved. Wonder about stopping abx soon as she has had one week of therapy.    PLAN   *  Follow LFTs. Cardiac cath today.     Azucena Freed  07/22/2014, 8:16 AM Pager: [phone]

## 2014-07-22 NOTE — Progress Notes (Signed)
ANTICOAGULATION CONSULT NOTE - Follow Up Consult  Pharmacy Consult for heparin  Indication: chest pain/ACS  Allergies  Allergen Reactions  . Alendronate Sodium   . Isosorbide   . Metformin And Related     Decreases appetite resulting in weight loss  . Metronidazole     REACTION: sick/ GI  . Nifedipine     Patient Measurements: Height: 5\' 2"  (157.5 cm) Weight: 117 lb 6.4 oz (53.252 kg) IBW/kg (Calculated) : 50.1 Heparin Dosing Weight: 52kg  Vital Signs: Temp: 98.2 F (36.8 C) (11/04 0748) Temp Source: Oral (11/04 0748) BP: 158/78 mmHg (11/04 0954) Pulse Rate: 104 (11/04 0954)  Labs:  Recent Labs  07/20/14 0922  07/21/14 0111 07/21/14 0902 07/21/14 1032 07/21/14 1850 07/22/14 0231  HGB  --   --  11.4* 11.8*  --   --  11.7*  HCT  --   --  34.6* 35.7*  --   --  35.1*  PLT  --   --  308 330  --   --  356  LABPROT 20.9*  --  21.2*  --   --   --  17.6*  INR 1.78*  --  1.82*  --   --   --  1.44  HEPARINUNFRC  --   < > 0.20*  --  0.43 0.34 0.49  CREATININE  --   --  0.77  --   --   --  0.64  < > = values in this interval not displayed.  Estimated Creatinine Clearance: 40.7 mL/min (by C-G formula based on Cr of 0.64).  Assessment: 41 YOF admitted on 10/31, started on heparin infusion for ACS/NSTEMI. Continues on heparin 1100 units/hr- now therapeutic x3. No overt bleeding noted. HL was 0.49 as of 2:30AM 11/4. Hbg and Hct still low but stable. Plt WNL.  Goal of Therapy:  Heparin level 0.3-0.7 units/ml Monitor platelets by anticoagulation protocol: Yes   Plan:  1. Continue heparin 1100 units/hr IV 2. Daily HL and CBC 3. Follow for plans for cath 4. Follow for s/s bleeding

## 2014-07-22 NOTE — CV Procedure (Addendum)
Left Heart Catheterization with Coronary Angiography  Report  Bianca Mueller  80 y.o.  female 1926/01/31  Procedure Date: 07/22/2014 Referring Physician: Casandra Doffing Primary Cardiologist: Ubaldo Glassing  INDICATIONS: Non-ST elevation myocardial infarction. Paroxysmal atrial fibrillation.  PROCEDURE: 1. Left heart catheterization; 2. Coronary angiography; 3. Left ventriculography  CONSENT:  The risks, benefits, and details of the procedure were explained in detail to the patient. Risks including death, stroke, heart attack, kidney injury, allergy, limb ischemia, bleeding and radiation injury were discussed.  The patient verbalized understanding and wanted to proceed.  Informed written consent was obtained.  PROCEDURE TECHNIQUE:  After Xylocaine anesthesia a 5 French Slender sheath was placed in the right radial artery with an angiocath and the modified Seldinger technique.  Coronary angiography was done using a 5 F JR 4 and JL 3.5 cm catheter.  Left ventriculography was done using the JR4 catheter and hand injection.   Images were reviewed. I'm concerned about an area phase and is within a diffusely diseased heavily calcified mid right coronary. There is also what appears to be a ruptured plaque distally and RCA before the large and of the PDA. The mid circumflex is totally occluded after the origin of the first obtuse marginal. No collaterals noted to feel that territory. The first diagonal has high grade disease of the bifurcation. Luckily, the LAD is a large vessel without significant obstruction.   CONTRAST:  Total of 85 cc.  COMPLICATIONS:  none   HEMODYNAMICS:  Aortic pressure 156/63 mmHg; LV pressure 159/17 mmHg; LVEDP 22 mmHg  ANGIOGRAPHIC DATA:   The left main coronary artery is widely patent.  The left anterior descending artery is heavily calcified, tortuous, and with irregularities but no obstruction greater than 30-40%.the second diagonal contains 90% stenosis rock small to  bifurcation and the more medial branch also contains 95% stenosis.  The left circumflex artery is severely and diffusely diseased. The mid circumflex is totally occluded contrast hang up suggesting possible recent total occlusion. His total occlusion occurs after the origin of the first obtuse marginal which contains segmental disease with up to 90% obstruction that vascular distribution is small to moderate.  The right coronary artery is heavily calcified from proximal to distal vessel. There is moderate to severe diffuse disease starting at the first acute marginal branch. A relatively focal eccentric region of 70-80% stenosis is noted embedded within a 40 cm segment of 50-60% stenosis. The distal RCA contains 90% stenosis within it appears to be a ruptured plaque. The PDA and left ventricular branches are patent. The vascular distribution is large.  LEFT VENTRICULOGRAM:  Left ventricular angiogram was done in the 30 RAO projection and revealed a normal sized cavity with an estimated ejection fraction of 55-60%. No obvious regional wall motion abnormality is noted other than deformation of the anterior wall mid segment. Despite that deformity there appears to be normal or near-normal contractility.   IMPRESSIONS:  1. Heavily calcified right and left coronary arteries 2. Severely and diffusely diseased mid to distal RCA. Large territory subtended by this vessel. Possible acute mid vessel lesion. 3. Total occlusion of the mid circumflex beyond the origin of the first obtuse marginal. The first marginal is severely and diffusely diseased. 4. Widely patent dominant LAD with severe disease in the second diagonal. 5. Overall normal LV function    RECOMMENDATION:  My initial recommendation will be medical therapy which should include dual antiplatelet therapy for at least a month. If angina recurs, territories that are  treatable may include the mid and distal right coronary (the distal lesion will be  difficult to treat because stent delivery will be impeded by the severe diffuse calcification noted throughout the entire mid segment).  The circumflex marginal can also be treated. However the vessel is very small in caliber and diffusely diseased, and may require rotational atherectomy.  In her current situation, it is difficult to tell exactly which vessel caused the enzyme rise. With wide patency of the LAD, I would choose conservative medical management rather than attempting revascularization of territories that would be difficult to treat and possibly have suboptimal outcome. Should symptoms continue to recur however complex angioplasty could be attempted.

## 2014-07-23 LAB — BASIC METABOLIC PANEL
ANION GAP: 19 — AB (ref 5–15)
BUN: 13 mg/dL (ref 6–23)
CALCIUM: 8.8 mg/dL (ref 8.4–10.5)
CHLORIDE: 99 meq/L (ref 96–112)
CO2: 22 mEq/L (ref 19–32)
CREATININE: 0.61 mg/dL (ref 0.50–1.10)
GFR calc non Af Amer: 80 mL/min — ABNORMAL LOW (ref >90)
Glucose, Bld: 181 mg/dL — ABNORMAL HIGH (ref 70–99)
Potassium: 3.1 mEq/L — ABNORMAL LOW (ref 3.7–5.3)
Sodium: 140 mEq/L (ref 137–147)

## 2014-07-23 LAB — GLUCOSE, CAPILLARY
GLUCOSE-CAPILLARY: 142 mg/dL — AB (ref 70–99)
GLUCOSE-CAPILLARY: 224 mg/dL — AB (ref 70–99)
Glucose-Capillary: 229 mg/dL — ABNORMAL HIGH (ref 70–99)
Glucose-Capillary: 134 mg/dL — ABNORMAL HIGH (ref 70–99)

## 2014-07-23 LAB — CBC
HCT: 34.3 % — ABNORMAL LOW (ref 36.0–46.0)
Hemoglobin: 11.3 g/dL — ABNORMAL LOW (ref 12.0–15.0)
MCH: 28.5 pg (ref 26.0–34.0)
MCHC: 32.9 g/dL (ref 30.0–36.0)
MCV: 86.4 fL (ref 78.0–100.0)
PLATELETS: 322 10*3/uL (ref 150–400)
RBC: 3.97 MIL/uL (ref 3.87–5.11)
RDW: 13.9 % (ref 11.5–15.5)
WBC: 9.3 10*3/uL (ref 4.0–10.5)

## 2014-07-23 LAB — HEPARIN LEVEL (UNFRACTIONATED): Heparin Unfractionated: 0.57 IU/mL (ref 0.30–0.70)

## 2014-07-23 MED ORDER — WARFARIN - PHARMACIST DOSING INPATIENT
Freq: Every day | Status: DC
  Administered 2014-07-27: 18:00:00

## 2014-07-23 MED ORDER — ALUM & MAG HYDROXIDE-SIMETH 200-200-20 MG/5ML PO SUSP
30.0000 mL | Freq: Four times a day (QID) | ORAL | Status: DC | PRN
  Administered 2014-07-23: 30 mL via ORAL
  Filled 2014-07-23: qty 30

## 2014-07-23 MED ORDER — DILTIAZEM HCL 90 MG PO TABS
90.0000 mg | ORAL_TABLET | Freq: Four times a day (QID) | ORAL | Status: DC
  Filled 2014-07-23 (×4): qty 1

## 2014-07-23 MED ORDER — FENTANYL CITRATE 0.05 MG/ML IJ SOLN: 50.0000 ug | Freq: Once | INTRAMUSCULAR | Status: DC

## 2014-07-23 MED ORDER — FENTANYL CITRATE 0.05 MG/ML IJ SOLN: 25.0000 ug | INTRAMUSCULAR | Status: DC | PRN

## 2014-07-23 MED ORDER — MIDAZOLAM HCL 2 MG/2ML IJ SOLN: 2.0000 mg | Freq: Once | INTRAMUSCULAR | Status: DC

## 2014-07-23 MED ORDER — WARFARIN SODIUM 2.5 MG PO TABS
2.5000 mg | ORAL_TABLET | Freq: Once | ORAL | Status: AC
  Administered 2014-07-23: 2.5 mg via ORAL
  Filled 2014-07-23: qty 1

## 2014-07-23 MED ORDER — CLOPIDOGREL BISULFATE 75 MG PO TABS
150.0000 mg | ORAL_TABLET | Freq: Once | ORAL | Status: AC
  Administered 2014-07-23: 150 mg via ORAL
  Filled 2014-07-23: qty 2

## 2014-07-23 MED ORDER — PROCHLORPERAZINE EDISYLATE 5 MG/ML IJ SOLN
10.0000 mg | Freq: Four times a day (QID) | INTRAMUSCULAR | Status: DC | PRN
  Filled 2014-07-23: qty 2

## 2014-07-23 MED ORDER — POTASSIUM CHLORIDE CRYS ER 20 MEQ PO TBCR
40.0000 meq | EXTENDED_RELEASE_TABLET | Freq: Once | ORAL | Status: AC
  Administered 2014-07-23: 40 meq via ORAL
  Filled 2014-07-23: qty 2

## 2014-07-23 MED ORDER — DILTIAZEM HCL 90 MG PO TABS
90.0000 mg | ORAL_TABLET | Freq: Four times a day (QID) | ORAL | Status: AC
Start: 2014-07-23 — End: 2014-07-24
  Administered 2014-07-23 – 2014-07-24 (×6): 90 mg via ORAL
  Filled 2014-07-23 (×9): qty 1

## 2014-07-23 NOTE — Progress Notes (Signed)
CARDIAC REHAB PHASE I   PRE:  Rate/Rhythm: 113 afib    BP: sitting 155/76    SaO2: 91 RA  MODE:  Ambulation: 150 ft   POST:  Rate/Rhythm: 140 afib    BP: sitting 157/74     SaO2: 95 2L  Pt moving well with RW, steady, no c/o. HR increased slowly from 113 afib to 140 afib by end of walk. Asx. SAO2 low normal before walk so used 2L for walking. Return to recliner. Feeling tired but good. Gave MI book. Will f/u. Has help at home with family. [place], [place], ACSM 07/23/2014 9:16 AM

## 2014-07-23 NOTE — Progress Notes (Signed)
SUBJECTIVE:  Underwent cardiac cath yesterday, no acute complaints, no chest pain, no nausea or abdominal pain.  Remains afebrile.  OBJECTIVE:   Vitals:   Filed Vitals:   07/23/14 0300 07/23/14 0400 07/23/14 0500 07/23/14 0700  BP: 155/57 155/57 165/63 149/58  Pulse: 62 69 81 69  Temp: 97.5 F (36.4 C)   97.8 F (36.6 C)  TempSrc: Oral   Oral  Resp: _0 Height:      Weight: 116 lb 14.4 oz (53.025 kg)     SpO2: 92% 95% 91% 94%   I&O's:    Intake/Output Summary (Last 24 hours) at 07/23/14 0752 Last data filed at 07/23/14 0600  Gross per 24 hour  Intake 956.76 ml  Output   1235 ml  Net -278.24 ml   TELEMETRY: Reviewed telemetry pt in A fib with rate 110s-120s on 65m/hr of IV diltiazem.  PHYSICAL EXAM  General: frail elderly female  Head:   Normal cephalic and atraumatic Lungs:  Clear bilaterally to auscultation. Heart:  irregularly irregular tachycardiac, No JVD.   Abdomen: abdomen soft and non-tender Extremities:  No edema.   Neuro: Alert and oriented. Psych:  Normal affect, responds appropriately   LABS: Basic Metabolic Panel:  Recent Labs  07/21/14 0111 07/22/14 0231  NA 138 137  K 3.8 3.6*  CL 102 101  CO2 19 21  GLUCOSE 99 137*  BUN 32* 22  CREATININE 0.77 0.64  CALCIUM 8.5 8.8   Liver Function Tests:  Recent Labs  07/21/14 0111 07/22/14 0231  AST 565* 206*  ALT 788* 579*  ALKPHOS 98 105  BILITOT 0.6 0.7  PROT 6.4 6.8  ALBUMIN 2.7* 2.9*    Recent Labs  07/21/14 0111  LIPASE 18   CBC:  Recent Labs  07/21/14 0902 07/22/14 0231 07/23/14 0353  WBC 18.7* 14.4* 9.3  NEUTROABS 16.6* 11.8*  --   HGB 11.8* 11.7* 11.3*  HCT 35.7* 35.1* 34.3*  MCV 87.3 86.9 86.4  PLT 330 356 322   Cardiac Enzymes: No results for input(s): CKTOTAL, CKMB, CKMBINDEX, TROPONINI in the last 72 hours. BNP: Invalid input(s): POCBNP D-Dimer: No results for input(s): DDIMER in the last 72 hours. Hemoglobin A1C: No results for input(s): HGBA1C  in the last 72 hours. Fasting Lipid Panel: No results for input(s): CHOL, HDL, LDLCALC, TRIG, CHOLHDL, LDLDIRECT in the last 72 hours. Thyroid Function Tests: No results for input(s): TSH, T4TOTAL, T3FREE, THYROIDAB in the last 72 hours.  Invalid input(s): FREET3 Anemia Panel: No results for input(s): VITAMINB12, FOLATE, FERRITIN, TIBC, IRON, RETICCTPCT in the last 72 hours. Coag Panel:   Lab Results  Component Value Date   INR 1.44 07/22/2014   INR 1.82* 07/21/2014   INR 1.78* 07/20/2014    RADIOLOGY: UKoreaAbdomen Port  07/20/2014   CLINICAL DATA:  Diffuse gallbladder wall thickening on a recent abdomen CTA.  EXAM: ULTRASOUND PORTABLE ABDOMEN  COMPARISON:  Abdomen CTA dated 07/18/2014.  FINDINGS: Gallbladder: No gallstones or wall thickening visualized. No sonographic Murphy sign noted.  Common bile duct: Diameter: 6.5 mm  Liver: No focal lesion identified. Within normal limits in parenchymal echogenicity.  IVC: No abnormality visualized.  Pancreas: Visualized portion unremarkable.  Spleen: Size and appearance within normal limits.  Right Kidney: Length: 10.4 cm. Echogenicity within normal limits. No mass or hydronephrosis visualized.  Left Kidney: Length: 10.1 cm. Echogenicity within normal limits. No mass or hydronephrosis visualized.  Abdominal aorta: No aneurysm visualized.  Other findings: Small bilateral pleural effusions.  IMPRESSION: 1. The gallbladder has a normal appearance today with no gallstones or wall thickening. 2. Small bilateral pleural effusions.   Electronically Signed   By: Enrique Sack M.D.   On: 07/20/2014 17:05   Dg Chest Port 1 View  07/18/2014   CLINICAL DATA:  Nausea vomiting and decreased oxygen saturation. Former smoker.  EXAM: PORTABLE CHEST - 1 VIEW  COMPARISON:  04/04/2014.  FINDINGS: Mild cardiac enlargement. Atherosclerotic disease is noted within the aortic arch. There are 2 nodular densities in the left base that are indeterminate. No pleural effusion  identified. Right lung is clear.  IMPRESSION: 1. Two nodular opacities are identified in the left base. In this patient who has a history of smoking a initial further workup with a dedicated PA and lateral upright radiographs are recommended.   Electronically Signed   By: Kerby Moors M.D.   On: 07/18/2014 19:25   Ct Angio Chest Aorta W/cm &/or Wo/cm  07/18/2014   CLINICAL DATA:  Nausea and vomiting.  Intermittent upper back pain.  EXAM: CT ANGIOGRAPHY CHEST, ABDOMEN AND PELVIS  TECHNIQUE: Multidetector CT imaging through the chest, abdomen and pelvis was performed using the standard protocol during bolus administration of intravenous contrast. Multiplanar reconstructed images and MIPs were obtained and reviewed to evaluate the vascular anatomy.  CONTRAST:  184m OMNIPAQUE IOHEXOL 350 MG/ML SOLN  COMPARISON:  None.  FINDINGS: CTA CHEST FINDINGS  Mediastinum: The heart size is normal. There is a small to moderate pericardial effusion identified. There is calcified atherosclerotic disease involving the thoracic aorta. No evidence for 8 dissection. The main pulmonary artery appears patent there is no abnormal filling defects within the main pulmonary artery or its branches.  Lungs/Pleura: There is atelectasis/ scarring identified within the lung bases. A calcified granuloma is noted in the left base. No airspace consolidation.  Musculoskeletal: There is mild spondylosis identified within the thoracic spine.  Review of the MIP images confirms the above findings.  CTA ABDOMEN AND PELVIS FINDINGS  Hepatobiliary: Normal appearance of the liver. There is diffuse gallbladder wall thickening measuring up to 8 mm. There is no biliary dilatation.  Pancreas: Normal appearance of the pancreas.  Spleen: The spleen is unremarkable.  Adrenals/Urinary Tract: The adrenal glands are both normal. There is mild bilateral renal cortical thinning. The urinary bladder is within normal limits.  Stomach/Bowel: Small hiatal hernia. The  stomach is otherwise unremarkable. The small bowel loops have a normal course and caliber without obstruction. The appendix is visualized and appears normal. Multiple distal colonic diverticula identified without acute inflammation.  Vascular/Lymphatic: There is calcified atherosclerotic disease involving the normal caliber abdominal aorta. There is no dissection. No retroperitoneal adenopathy.  Reproductive: Previous hysterectomy. The adnexal structures appear normal.  Other: There is no free fluid or fluid collections within the abdomen or pelvis.  Musculoskeletal: Review of the visualized osseous structures is significant for degenerative disc disease within the L4-5 level.  Review of the MIP images confirms the above findings.  IMPRESSION: 1. No evidence for aortic dissection. 2. Atherosclerotic disease. 3. Gallbladder wall thickening. If there are clinical signs or symptoms of cholecystitis consider further investigation with abdominal sonogram. 4. Hiatal hernia 5. Thoracic and lumbar degenerative disc disease.   Electronically Signed   By: TKerby MoorsM.D.   On: 07/18/2014 20:35   Ct Cta Abd/pel W/cm &/or W/o Cm  07/18/2014   CLINICAL DATA:  Nausea and vomiting.  Intermittent upper back pain.  EXAM: CT ANGIOGRAPHY CHEST, ABDOMEN AND PELVIS  TECHNIQUE:  Multidetector CT imaging through the chest, abdomen and pelvis was performed using the standard protocol during bolus administration of intravenous contrast. Multiplanar reconstructed images and MIPs were obtained and reviewed to evaluate the vascular anatomy.  CONTRAST:  156m OMNIPAQUE IOHEXOL 350 MG/ML SOLN  COMPARISON:  None.  FINDINGS: CTA CHEST FINDINGS  Mediastinum: The heart size is normal. There is a small to moderate pericardial effusion identified. There is calcified atherosclerotic disease involving the thoracic aorta. No evidence for 8 dissection. The main pulmonary artery appears patent there is no abnormal filling defects within the main  pulmonary artery or its branches.  Lungs/Pleura: There is atelectasis/ scarring identified within the lung bases. A calcified granuloma is noted in the left base. No airspace consolidation.  Musculoskeletal: There is mild spondylosis identified within the thoracic spine.  Review of the MIP images confirms the above findings.  CTA ABDOMEN AND PELVIS FINDINGS  Hepatobiliary: Normal appearance of the liver. There is diffuse gallbladder wall thickening measuring up to 8 mm. There is no biliary dilatation.  Pancreas: Normal appearance of the pancreas.  Spleen: The spleen is unremarkable.  Adrenals/Urinary Tract: The adrenal glands are both normal. There is mild bilateral renal cortical thinning. The urinary bladder is within normal limits.  Stomach/Bowel: Small hiatal hernia. The stomach is otherwise unremarkable. The small bowel loops have a normal course and caliber without obstruction. The appendix is visualized and appears normal. Multiple distal colonic diverticula identified without acute inflammation.  Vascular/Lymphatic: There is calcified atherosclerotic disease involving the normal caliber abdominal aorta. There is no dissection. No retroperitoneal adenopathy.  Reproductive: Previous hysterectomy. The adnexal structures appear normal.  Other: There is no free fluid or fluid collections within the abdomen or pelvis.  Musculoskeletal: Review of the visualized osseous structures is significant for degenerative disc disease within the L4-5 level.  Review of the MIP images confirms the above findings.  IMPRESSION: 1. No evidence for aortic dissection. 2. Atherosclerotic disease. 3. Gallbladder wall thickening. If there are clinical signs or symptoms of cholecystitis consider further investigation with abdominal sonogram. 4. Hiatal hernia 5. Thoracic and lumbar degenerative disc disease.   Electronically Signed   By: TKerby MoorsM.D.   On: 07/18/2014 20:35      ASSESSMENT:  NSTEMI Atrial  Flutter Transaminitis (normal alk phos) HTN T2DM Leukocytosis (Trending down)/ Acute Sinusitus Nausea   PLAN:   -NSTEMI>>> cath results show diffusely diseased right and left coronary arteris not easily stentable.  Recommend dual antiplatlet therapy for at least 1 month, if angina returns then can may treat the mid and distal right coronary as well as the circumflex marginal.  - Atrial Flutter/ A fib >>> patient in and out of A fib, will transition to PO Cardizem ([id number]), and start to transition to warfarin per pharmacy.  - Hypokalemia> 3.6 yesterday, normal mag, will repeat BMP today.  -Transaminitis >> holding statin, LFTs improving.  Possibly secondary to ischemia versus drug induced (?doxycyline> now discontinued).  Repeat LFts in AM.  Patient will need follow up with [facility] after discharge  -HTN >> BP 149/58 this AM. Change to PO cardiazem and metoprolol 12.5 BID.     - T2DM >>  Mildly hyperglycemic. Advance diet  - Sinusitus>>> augmentin will treat for 5 days (day 2)  - Leukocytosis>> suspect due to sinusitis trended down to normal.  -Nausea>> resolved   Bianca RummageIMTS PGY2 Pager: [phone]/01/2014  7:52 AM   I have examined the patient and reviewed assessment  and plan and discussed with patient.  Agree with above as stated.  She feels better.  Consider PCI of the RCA if she has angina.  Start Coumadin for AFib.  Discussed stroke prevention in the setting of AFib.    Courtland Coppa S.

## 2014-07-23 NOTE — Care Management Note (Signed)
    Page 1 of 1   07/28/2014     3:28:56 PM CARE MANAGEMENT NOTE 07/28/2014  Patient:  Bianca Mueller, Bianca Mueller   Account Number:  [id number]  Date Initiated:  07/20/2014  Documentation initiated by:  [facility]  Subjective/Objective Assessment:   NSTEMI     Action/Plan:   waiting final recommendations   Anticipated DC Date:  07/30/2014   Anticipated DC Plan:  [place]  CM consult      Choice offered to / List presented to:             Status of service:  In process, will continue to follow Medicare Important Message given?  YES (If response is "NO", the following Medicare IM given date fields will be blank) Date Medicare IM given:  07/23/2014 Medicare IM given by:  [facility] Date Additional Medicare IM given:  07/27/2014 Additional Medicare IM given by:  Lakrista Scaduto  Discharge Disposition:    Per UR Regulation:  Reviewed for med. necessity/level of care/duration of stay  If discussed at [place] of Stay Meetings, dates discussed:   07/28/2014    Comments:  Contact:  Stigler,Brodie Spouse [phone] PCP:  Loura Pardon  07/28/14 Ellan Lambert, RN, BSN [phone] PT/OT recommending no follow up.  Will cont to follow for dc needs as pt progresses.  07-23-14 10:50am Shanna Cisco - [phone] Patient up in chair.  Husband and daughter at bedside. patient lives at home with husband in [place]. Independent.  Have never had [facility] - May need [place] nurse at least to follow on discharge.  Cardiac rehab involved.  CM will continue to follow.

## 2014-07-23 NOTE — Progress Notes (Signed)
ANTICOAGULATION CONSULT NOTE - Follow Up Consult  Pharmacy Consult for heparin and warfarin  Indication: chest pain/ACS and AFib  Allergies  Allergen Reactions  . Alendronate Sodium   . Isosorbide   . Metformin And Related     Decreases appetite resulting in weight loss  . Metronidazole     REACTION: sick/ GI  . Nifedipine     Patient Measurements: Height: 5\' 2"  (157.5 cm) Weight: 116 lb 14.4 oz (53.025 kg) IBW/kg (Calculated) : 50.1 Heparin Dosing Weight: 52kg  Vital Signs: Temp: 97.8 F (36.6 C) (11/05 0700) Temp Source: Oral (11/05 0700) BP: 102/81 mmHg (11/05 1000) Pulse Rate: 67 (11/05 0800)  Labs:  Recent Labs  07/21/14 0111 07/21/14 0902  07/21/14 1850 07/22/14 0231 07/23/14 0353 07/23/14 0912 07/23/14 0930  HGB 11.4* 11.8*  --   --  11.7* 11.3*  --   --   HCT 34.6* 35.7*  --   --  35.1* 34.3*  --   --   PLT 308 330  --   --  356 322  --   --   LABPROT 21.2*  --   --   --  17.6*  --   --   --   INR 1.82*  --   --   --  1.44  --   --   --   HEPARINUNFRC 0.20*  --   < > 0.34 0.49  --   --  0.57  CREATININE 0.77  --   --   --  0.64  --  0.61  --   < > = values in this interval not displayed.  Estimated Creatinine Clearance: 40.7 mL/min (by C-G formula based on Cr of 0.61).  Assessment: 33 YOF admitted on 10/31, started on heparin infusion for ACS/NSTEMI, now s/p cath which found heavily calcified right and left coronary arteries. Will continue to follow for symptoms and if recur, she may need angioplasty per cath note by Dr. Tamala Julian. She was therapeutic on heparin at 1100 units/hr. Sheath was removed at 1740. No overt bleeding noted post-cath. Last HL is therapeutic at 0.57. INR is currently subtherapeutic at 1.44. Plan to transition off the heparin gtt when INR is therapeutic on coumadin. Hgb and Plts stable. Due to age, low weight, and possible baseline liver dysfunction  Goal of Therapy:  Heparin level 0.3-0.7 units/ml Monitor platelets by anticoagulation  protocol: Yes  Goal INR 2-3   Plan:  1. Start warfarin 2.5mg  PO daily 2. Resume heparin at 1100 units/hr 3. Daily HL, INR, CBC 4. Follow for s/s bleeding and interventional plans

## 2014-07-23 NOTE — Progress Notes (Signed)
PA paged about pt's K 3.1.  Awaiting orders.

## 2014-07-24 LAB — GLUCOSE, CAPILLARY
GLUCOSE-CAPILLARY: 163 mg/dL — AB (ref 70–99)
Glucose-Capillary: 154 mg/dL — ABNORMAL HIGH (ref 70–99)
Glucose-Capillary: 216 mg/dL — ABNORMAL HIGH (ref 70–99)
Glucose-Capillary: 238 mg/dL — ABNORMAL HIGH (ref 70–99)

## 2014-07-24 LAB — CBC
HCT: 34.6 % — ABNORMAL LOW (ref 36.0–46.0)
HEMOGLOBIN: 11.5 g/dL — AB (ref 12.0–15.0)
MCH: 28.8 pg (ref 26.0–34.0)
MCHC: 33.2 g/dL (ref 30.0–36.0)
MCV: 86.5 fL (ref 78.0–100.0)
Platelets: 343 10*3/uL (ref 150–400)
RBC: 4 MIL/uL (ref 3.87–5.11)
RDW: 14.1 % (ref 11.5–15.5)
WBC: 8 10*3/uL (ref 4.0–10.5)

## 2014-07-24 LAB — COMPREHENSIVE METABOLIC PANEL
ALK PHOS: 80 U/L (ref 39–117)
ALT: 208 U/L — ABNORMAL HIGH (ref 0–35)
ANION GAP: 15 (ref 5–15)
AST: 34 U/L (ref 0–37)
Albumin: 2.7 g/dL — ABNORMAL LOW (ref 3.5–5.2)
BUN: 12 mg/dL (ref 6–23)
CO2: 23 mEq/L (ref 19–32)
CREATININE: 0.58 mg/dL (ref 0.50–1.10)
Calcium: 8.5 mg/dL (ref 8.4–10.5)
Chloride: 102 mEq/L (ref 96–112)
GFR calc non Af Amer: 82 mL/min — ABNORMAL LOW (ref >90)
GLUCOSE: 188 mg/dL — AB (ref 70–99)
Potassium: 3.9 mEq/L (ref 3.7–5.3)
Sodium: 140 mEq/L (ref 137–147)
TOTAL PROTEIN: 6.3 g/dL (ref 6.0–8.3)
Total Bilirubin: 0.7 mg/dL (ref 0.3–1.2)

## 2014-07-24 LAB — PROTIME-INR
INR: 1.26 (ref 0.00–1.49)
Prothrombin Time: 15.9 seconds — ABNORMAL HIGH (ref 11.6–15.2)

## 2014-07-24 LAB — HEPARIN LEVEL (UNFRACTIONATED): Heparin Unfractionated: 0.57 IU/mL (ref 0.30–0.70)

## 2014-07-24 MED ORDER — METOPROLOL TARTRATE 25 MG PO TABS
25.0000 mg | ORAL_TABLET | Freq: Two times a day (BID) | ORAL | Status: DC
  Administered 2014-07-24 – 2014-07-29 (×11): 25 mg via ORAL
  Filled 2014-07-24 (×11): qty 1

## 2014-07-24 MED ORDER — DILTIAZEM HCL ER COATED BEADS 360 MG PO CP24
360.0000 mg | ORAL_CAPSULE | Freq: Every day | ORAL | Status: DC
  Administered 2014-07-25 – 2014-07-29 (×5): 360 mg via ORAL
  Filled 2014-07-24 (×5): qty 1

## 2014-07-24 MED ORDER — WARFARIN SODIUM 2.5 MG PO TABS
2.5000 mg | ORAL_TABLET | Freq: Once | ORAL | Status: AC
  Administered 2014-07-24: 2.5 mg via ORAL
  Filled 2014-07-24 (×2): qty 1

## 2014-07-24 NOTE — Progress Notes (Signed)
ANTICOAGULATION CONSULT NOTE - Follow Up Consult  Pharmacy Consult for heparin and warfarin  Indication: chest pain/ACS and AFib  Allergies  Allergen Reactions  . Alendronate Sodium   . Isosorbide   . Metformin And Related     Decreases appetite resulting in weight loss  . Metronidazole     REACTION: sick/ GI  . Nifedipine     Patient Measurements: Height: 5\' 2"  (157.5 cm) Weight: 117 lb 14.4 oz (53.479 kg) IBW/kg (Calculated) : 50.1 Heparin Dosing Weight: 52kg  Vital Signs: Temp: 97.4 F (36.3 C) (11/06 0718) Temp Source: Oral (11/06 0718) BP: 130/78 mmHg (11/06 0718) Pulse Rate: 80 (11/06 0718)  Labs:  Recent Labs  07/22/14 0231 07/23/14 0353 07/23/14 0912 07/23/14 0930 07/24/14 0200  HGB 11.7* 11.3*  --   --  11.5*  HCT 35.1* 34.3*  --   --  34.6*  PLT 356 322  --   --  343  LABPROT 17.6*  --   --   --  15.9*  INR 1.44  --   --   --  1.26  HEPARINUNFRC 0.49  --   --  0.57 0.57  CREATININE 0.64  --  0.61  --  0.58    Estimated Creatinine Clearance: 40.7 mL/min (by C-G formula based on Cr of 0.58).  Assessment: 68 YOF admitted on 10/31, started on heparin infusion for ACS/NSTEMI, now s/p cath which found heavily calcified right and left coronary arteries. Will continue to follow for symptoms and if recur, she may need angioplasty per cath note by Dr. Tamala Julian. She was therapeutic on heparin at 1100 units/hr. Sheath was removed at 1740. No overt bleeding noted post-cath. Last HL is therapeutic at 0.57. Plan to transition off the heparin gtt when INR is therapeutic on coumadin. Current INR is 1.26, H&H trending up at 11.5/34.6 Plts WNL. Due to age, low weight, and possible baseline liver dysfunction.  Goal of Therapy:  Heparin level 0.3-0.7 units/ml Monitor platelets by anticoagulation protocol: Yes  Goal INR 2-3   Plan:  1. Started warfarin 2.5mg  PO daily 2. Continue heparin at 1100 units/hr 3. Daily HL, INR, CBC 4. Follow for s/s bleeding and interventional  plans

## 2014-07-24 NOTE — Progress Notes (Signed)
SUBJECTIVE: Patient doing very well, she is eating without any nausea or abdominal pain.  She denies any SOB.  OBJECTIVE:   Vitals:   Filed Vitals:   07/24/14 0400 07/24/14 0500 07/24/14 0600 07/24/14 0718  BP: 133/59 150/55 110/58 130/78  Pulse:    80  Temp:    97.4 F (36.3 C)  TempSrc:    Oral  Resp:    18  Height:      Weight:      SpO2:    95%   I&O's:    Intake/Output Summary (Last 24 hours) at 07/24/14 0801 Last data filed at 07/24/14 0650  Gross per 24 hour  Intake 260.33 ml  Output   3800 ml  Net -3539.67 ml   TELEMETRY: Reviewed telemetry pt in A flutter with rate 80s.  PHYSICAL EXAM  General: frail elderly female  Head:   Normal cephalic and atraumatic Lungs:  Clear bilaterally to auscultation. Heart:  irregularly irregular tachycardiac, No JVD.   Abdomen: abdomen soft and non-tender Extremities:  No edema.   Neuro: Alert and oriented. Psych:  Normal affect, responds appropriately   LABS: Basic Metabolic Panel:  Recent Labs  07/23/14 0912 07/24/14 0200  NA 140 140  K 3.1* 3.9  CL 99 102  CO2 22 23  GLUCOSE 181* 188*  BUN 13 12  CREATININE 0.61 0.58  CALCIUM 8.8 8.5   Liver Function Tests:  Recent Labs  07/22/14 0231 07/24/14 0200  AST 206* 34  ALT 579* 208*  ALKPHOS 105 80  BILITOT 0.7 0.7  PROT 6.8 6.3  ALBUMIN 2.9* 2.7*   No results for input(s): LIPASE, AMYLASE in the last 72 hours. CBC:  Recent Labs  07/21/14 0902 07/22/14 0231 07/23/14 0353 07/24/14 0200  WBC 18.7* 14.4* 9.3 8.0  NEUTROABS 16.6* 11.8*  --   --   HGB 11.8* 11.7* 11.3* 11.5*  HCT 35.7* 35.1* 34.3* 34.6*  MCV 87.3 86.9 86.4 86.5  PLT 330 356 322 343   Cardiac Enzymes: No results for input(s): CKTOTAL, CKMB, CKMBINDEX, TROPONINI in the last 72 hours. BNP: Invalid input(s): POCBNP D-Dimer: No results for input(s): DDIMER in the last 72 hours. Hemoglobin A1C: No results for input(s): HGBA1C in the last 72 hours. Fasting Lipid Panel: No results  for input(s): CHOL, HDL, LDLCALC, TRIG, CHOLHDL, LDLDIRECT in the last 72 hours. Thyroid Function Tests: No results for input(s): TSH, T4TOTAL, T3FREE, THYROIDAB in the last 72 hours.  Invalid input(s): FREET3 Anemia Panel: No results for input(s): VITAMINB12, FOLATE, FERRITIN, TIBC, IRON, RETICCTPCT in the last 72 hours. Coag Panel:   Lab Results  Component Value Date   INR 1.26 07/24/2014   INR 1.44 07/22/2014   INR 1.82* 07/21/2014    RADIOLOGY: US Abdomen Port  07/20/2014   CLINICAL DATA:  Diffuse gallbladder wall thickening on a recent abdomen CTA.  EXAM: ULTRASOUND PORTABLE ABDOMEN  COMPARISON:  Abdomen CTA dated 07/18/2014.  FINDINGS: Gallbladder: No gallstones or wall thickening visualized. No sonographic Murphy sign noted.  Common bile duct: Diameter: 6.5 mm  Liver: No focal lesion identified. Within normal limits in parenchymal echogenicity.  IVC: No abnormality visualized.  Pancreas: Visualized portion unremarkable.  Spleen: Size and appearance within normal limits.  Right Kidney: Length: 10.4 cm. Echogenicity within normal limits. No mass or hydronephrosis visualized.  Left Kidney: Length: 10.1 cm. Echogenicity within normal limits. No mass or hydronephrosis visualized.  Abdominal aorta: No aneurysm visualized.  Other findings: Small bilateral pleural effusions.  IMPRESSION: 1. The  gallbladder has a normal appearance today with no gallstones or wall thickening. 2. Small bilateral pleural effusions.   Electronically Signed   By: Enrique Sack M.D.   On: 07/20/2014 17:05   Dg Chest Port 1 View  07/18/2014   CLINICAL DATA:  Nausea vomiting and decreased oxygen saturation. Former smoker.  EXAM: PORTABLE CHEST - 1 VIEW  COMPARISON:  04/04/2014.  FINDINGS: Mild cardiac enlargement. Atherosclerotic disease is noted within the aortic arch. There are 2 nodular densities in the left base that are indeterminate. No pleural effusion identified. Right lung is clear.  IMPRESSION: 1. Two nodular  opacities are identified in the left base. In this patient who has a history of smoking a initial further workup with a dedicated PA and lateral upright radiographs are recommended.   Electronically Signed   By: Kerby Moors M.D.   On: 07/18/2014 19:25   Ct Angio Chest Aorta W/cm &/or Wo/cm  07/18/2014   CLINICAL DATA:  Nausea and vomiting.  Intermittent upper back pain.  EXAM: CT ANGIOGRAPHY CHEST, ABDOMEN AND PELVIS  TECHNIQUE: Multidetector CT imaging through the chest, abdomen and pelvis was performed using the standard protocol during bolus administration of intravenous contrast. Multiplanar reconstructed images and MIPs were obtained and reviewed to evaluate the vascular anatomy.  CONTRAST:  156m OMNIPAQUE IOHEXOL 350 MG/ML SOLN  COMPARISON:  None.  FINDINGS: CTA CHEST FINDINGS  Mediastinum: The heart size is normal. There is a small to moderate pericardial effusion identified. There is calcified atherosclerotic disease involving the thoracic aorta. No evidence for 8 dissection. The main pulmonary artery appears patent there is no abnormal filling defects within the main pulmonary artery or its branches.  Lungs/Pleura: There is atelectasis/ scarring identified within the lung bases. A calcified granuloma is noted in the left base. No airspace consolidation.  Musculoskeletal: There is mild spondylosis identified within the thoracic spine.  Review of the MIP images confirms the above findings.  CTA ABDOMEN AND PELVIS FINDINGS  Hepatobiliary: Normal appearance of the liver. There is diffuse gallbladder wall thickening measuring up to 8 mm. There is no biliary dilatation.  Pancreas: Normal appearance of the pancreas.  Spleen: The spleen is unremarkable.  Adrenals/Urinary Tract: The adrenal glands are both normal. There is mild bilateral renal cortical thinning. The urinary bladder is within normal limits.  Stomach/Bowel: Small hiatal hernia. The stomach is otherwise unremarkable. The small bowel loops have  a normal course and caliber without obstruction. The appendix is visualized and appears normal. Multiple distal colonic diverticula identified without acute inflammation.  Vascular/Lymphatic: There is calcified atherosclerotic disease involving the normal caliber abdominal aorta. There is no dissection. No retroperitoneal adenopathy.  Reproductive: Previous hysterectomy. The adnexal structures appear normal.  Other: There is no free fluid or fluid collections within the abdomen or pelvis.  Musculoskeletal: Review of the visualized osseous structures is significant for degenerative disc disease within the L4-5 level.  Review of the MIP images confirms the above findings.  IMPRESSION: 1. No evidence for aortic dissection. 2. Atherosclerotic disease. 3. Gallbladder wall thickening. If there are clinical signs or symptoms of cholecystitis consider further investigation with abdominal sonogram. 4. Hiatal hernia 5. Thoracic and lumbar degenerative disc disease.   Electronically Signed   By: TKerby MoorsM.D.   On: 07/18/2014 20:35   Ct Cta Abd/pel W/cm &/or W/o Cm  07/18/2014   CLINICAL DATA:  Nausea and vomiting.  Intermittent upper back pain.  EXAM: CT ANGIOGRAPHY CHEST, ABDOMEN AND PELVIS  TECHNIQUE: Multidetector CT imaging  through the chest, abdomen and pelvis was performed using the standard protocol during bolus administration of intravenous contrast. Multiplanar reconstructed images and MIPs were obtained and reviewed to evaluate the vascular anatomy.  CONTRAST:  182m OMNIPAQUE IOHEXOL 350 MG/ML SOLN  COMPARISON:  None.  FINDINGS: CTA CHEST FINDINGS  Mediastinum: The heart size is normal. There is a small to moderate pericardial effusion identified. There is calcified atherosclerotic disease involving the thoracic aorta. No evidence for 8 dissection. The main pulmonary artery appears patent there is no abnormal filling defects within the main pulmonary artery or its branches.  Lungs/Pleura: There is  atelectasis/ scarring identified within the lung bases. A calcified granuloma is noted in the left base. No airspace consolidation.  Musculoskeletal: There is mild spondylosis identified within the thoracic spine.  Review of the MIP images confirms the above findings.  CTA ABDOMEN AND PELVIS FINDINGS  Hepatobiliary: Normal appearance of the liver. There is diffuse gallbladder wall thickening measuring up to 8 mm. There is no biliary dilatation.  Pancreas: Normal appearance of the pancreas.  Spleen: The spleen is unremarkable.  Adrenals/Urinary Tract: The adrenal glands are both normal. There is mild bilateral renal cortical thinning. The urinary bladder is within normal limits.  Stomach/Bowel: Small hiatal hernia. The stomach is otherwise unremarkable. The small bowel loops have a normal course and caliber without obstruction. The appendix is visualized and appears normal. Multiple distal colonic diverticula identified without acute inflammation.  Vascular/Lymphatic: There is calcified atherosclerotic disease involving the normal caliber abdominal aorta. There is no dissection. No retroperitoneal adenopathy.  Reproductive: Previous hysterectomy. The adnexal structures appear normal.  Other: There is no free fluid or fluid collections within the abdomen or pelvis.  Musculoskeletal: Review of the visualized osseous structures is significant for degenerative disc disease within the L4-5 level.  Review of the MIP images confirms the above findings.  IMPRESSION: 1. No evidence for aortic dissection. 2. Atherosclerotic disease. 3. Gallbladder wall thickening. If there are clinical signs or symptoms of cholecystitis consider further investigation with abdominal sonogram. 4. Hiatal hernia 5. Thoracic and lumbar degenerative disc disease.   Electronically Signed   By: TKerby MoorsM.D.   On: 07/18/2014 20:35      ASSESSMENT:  NSTEMI Atrial Flutter Transaminitis (normal alk phos) HTN T2DM Leukocytosis (Trending  down)/ Acute Sinusitus Nausea   PLAN:   NSTEMI>>> cath results show diffusely diseased right and left coronary arteris not easily stentable.  Recommend dual antiplatlet therapy for at least 1 month, if angina returns then consider PCI to RCA..Marland Kitchen Atrial Flutter/ A fib >>>  - PO Cardizem (953mQID) will convert to 360 Mg XR tomorrow - Increase Metoprolol to 2562mID - Warfarin per pharmacy INR 1.26 today - Continue Heparin   Hypokalemia> repleted and resolved  Transaminitis >> holding statin, LFTs improving.  Possibly secondary to ischemia versus drug induced (?doxycyline> now discontinued).  AST down to normal limits with ALT still mildly elevated.  Patient will need follow up with [facility] after discharge  HTN >> BP 130/78 this AM.  On PO cardiazem and increasing metoprolol to 25 BID.    T2DM >>  Mildly hyperglycemic., minimal need for SSI  Sinusitus>>> augmentin> will treat for 5 days (end 07/25/14)  Global: Transfer to Telemetry today.  Will place PT/OT consult.  HofLucious GrovesO  IMTS PGY2 Pager: [phone]/02/2014  8:01 AM   I have examined the patient and reviewed assessment and plan and discussed with patient.  Agree with above  as stated.  Transfer to tele.  Medical therapy for now.   Harlen Danford S.

## 2014-07-24 NOTE — Progress Notes (Addendum)
CARDIAC REHAB PHASE I   PRE:  Rate/Rhythm: 85 aflutter    BP: sitting 126/61    SaO2: 95-96 RA  MODE:  Ambulation: 150 ft   POST:  Rate/Rhythm: 116 aflutter    BP: sitting 131/58     SaO2: 95 RA  Steady with RW. No c/o walking but stated she was very tired after walk. In aflutter, max 116. Pt sts this was her second walk today. Pt would like HH RN and [place] PT at d/c. Sts she has a RW. Encouraged pt to increase walking slowly at home.  [phone]   Bianca Mueller CES, ACSM 07/24/2014 1:26 PM

## 2014-07-24 NOTE — Progress Notes (Signed)
Order for transfer verified. Rm [id number] available. Pt and Husband informed and verbalized understanding. Belongings packed. Report called to receiving RN. Pt transported via wheel chair with belongings and accompanied by Husband. [place] notified.

## 2014-07-24 NOTE — Plan of Care (Signed)
Problem: Phase I Progression Outcomes Goal: Anginal pain relieved Outcome: Completed/Met Date Met:  07/24/14 Goal: Aspirin unless contraindicated Outcome: Completed/Met Date Met:  07/24/14 Goal: Voiding-avoid urinary catheter unless indicated Outcome: Completed/Met Date Met:  07/24/14 Goal: Hemodynamically stable Outcome: Completed/Met Date Met:  07/24/14 Goal: Vascular site scale level 0 - I Vascular Site Scale Level 0: No bruising/bleeding/hematoma Level I (Mild): Bruising/Ecchymosis, minimal bleeding/ooozing, palpable hematoma < 3 cm Level II (Moderate): Bleeding not affecting hemodynamic parameters, pseudoaneurysm, palpable hematoma > 3 cm Level III (Severe) Bleeding which affects hemodynamic parameters or retroperitoneal hemorrhage  Outcome: Progressing  Problem: Phase II Progression Outcomes Goal: Anginal pain absent Outcome: Completed/Met Date Met:  07/24/14 Goal: Hemodynamically stable Outcome: Completed/Met Date Met:  07/24/14 Goal: Vascular site scale level 0 - I Vascular Site Scale Level 0: No bruising/bleeding/hematoma Level I (Mild): Bruising/Ecchymosis, minimal bleeding/ooozing, palpable hematoma < 3 cm Level II (Moderate): Bleeding not affecting hemodynamic parameters, pseudoaneurysm, palpable hematoma > 3 cm Level III (Severe) Bleeding which affects hemodynamic parameters or retroperitoneal hemorrhage  Outcome: Progressing Goal: Tolerating diet Outcome: Progressing  Problem: Phase III Progression Outcomes Goal: Anginal pain absent Outcome: Completed/Met Date Met:  07/24/14 Goal: Up to chair & ambulate with assist (TID) Outcome: Completed/Met Date Met:  07/24/14 Goal: VS Stable with increased activity Outcome: Progressing Goal: Vascular site scale level 0 - I Vascular Site Scale Level 0: No bruising/bleeding/hematoma Level I (Mild): Bruising/Ecchymosis, minimal bleeding/ooozing, palpable hematoma < 3 cm Level II (Moderate): Bleeding not affecting hemodynamic  parameters, pseudoaneurysm, palpable hematoma > 3 cm Level III (Severe) Bleeding which affects hemodynamic parameters or retroperitoneal hemorrhage  Outcome: Progressing Goal: Tolerating diet Outcome: Progressing

## 2014-07-24 NOTE — Plan of Care (Signed)
Problem: Consults Goal: Skin Care Protocol Initiated - if Braden Score 18 or less If consults are not indicated, leave blank or document N/A  Outcome: Completed/Met Date Met:  07/24/14 Goal: Tobacco Cessation referral if indicated Outcome: Completed/Met Date Met:  07/24/14  Problem: Phase I Progression Outcomes Goal: Pain controlled with appropriate interventions Outcome: Completed/Met Date Met:  07/24/14 Goal: Voiding-avoid urinary catheter unless indicated Outcome: Completed/Met Date Met:  07/24/14 Goal: Hemodynamically stable Outcome: Completed/Met Date Met:  07/24/14 Goal: Distal pulses equal to baseline Outcome: Completed/Met Date Met:  07/24/14 Goal: Vascular site scale level 0 - I Vascular Site Scale Level 0: No bruising/bleeding/hematoma Level I (Mild): Bruising/Ecchymosis, minimal bleeding/ooozing, palpable hematoma < 3 cm Level II (Moderate): Bleeding not affecting hemodynamic parameters, pseudoaneurysm, palpable hematoma > 3 cm Level III (Severe) Bleeding which affects hemodynamic parameters or retroperitoneal hemorrhage  Outcome: Progressing  Problem: Phase II Progression Outcomes Goal: Pain controlled with appropriate interventions Outcome: Completed/Met Date Met:  07/24/14 Goal: Hemodynamically stable Outcome: Completed/Met Date Met:  07/24/14 Goal: Ambulates up to 600 ft. in hall x 1 Outcome: Completed/Met Date Met:  07/24/14 Goal: Tolerates diet Outcome: Progressing

## 2014-07-25 LAB — CBC
HEMATOCRIT: 34.6 % — AB (ref 36.0–46.0)
Hemoglobin: 11.5 g/dL — ABNORMAL LOW (ref 12.0–15.0)
MCH: 29 pg (ref 26.0–34.0)
MCHC: 33.2 g/dL (ref 30.0–36.0)
MCV: 87.2 fL (ref 78.0–100.0)
PLATELETS: 339 10*3/uL (ref 150–400)
RBC: 3.97 MIL/uL (ref 3.87–5.11)
RDW: 14.6 % (ref 11.5–15.5)
WBC: 9.9 10*3/uL (ref 4.0–10.5)

## 2014-07-25 LAB — COMPREHENSIVE METABOLIC PANEL
ALBUMIN: 2.7 g/dL — AB (ref 3.5–5.2)
ALT: 134 U/L — ABNORMAL HIGH (ref 0–35)
AST: 29 U/L (ref 0–37)
Alkaline Phosphatase: 79 U/L (ref 39–117)
Anion gap: 12 (ref 5–15)
BUN: 15 mg/dL (ref 6–23)
CHLORIDE: 100 meq/L (ref 96–112)
CO2: 25 mEq/L (ref 19–32)
CREATININE: 0.66 mg/dL (ref 0.50–1.10)
Calcium: 8.6 mg/dL (ref 8.4–10.5)
GFR calc Af Amer: >90 mL/min (ref >90)
GFR calc non Af Amer: 78 mL/min — ABNORMAL LOW (ref >90)
Glucose, Bld: 162 mg/dL — ABNORMAL HIGH (ref 70–99)
Potassium: 3.9 mEq/L (ref 3.7–5.3)
Sodium: 137 mEq/L (ref 137–147)
TOTAL PROTEIN: 6.3 g/dL (ref 6.0–8.3)
Total Bilirubin: 0.5 mg/dL (ref 0.3–1.2)

## 2014-07-25 LAB — GLUCOSE, CAPILLARY
GLUCOSE-CAPILLARY: 163 mg/dL — AB (ref 70–99)
GLUCOSE-CAPILLARY: 144 mg/dL — AB (ref 70–99)
Glucose-Capillary: 185 mg/dL — ABNORMAL HIGH (ref 70–99)
Glucose-Capillary: 210 mg/dL — ABNORMAL HIGH (ref 70–99)

## 2014-07-25 LAB — HEPARIN LEVEL (UNFRACTIONATED)
Heparin Unfractionated: 0.76 IU/mL — ABNORMAL HIGH (ref 0.30–0.70)
Heparin Unfractionated: 0.11 IU/mL — ABNORMAL LOW (ref 0.30–0.70)
Heparin Unfractionated: 0.58 IU/mL (ref 0.30–0.70)

## 2014-07-25 LAB — PROTIME-INR
INR: 1.31 (ref 0.00–1.49)
PROTHROMBIN TIME: 16.4 s — AB (ref 11.6–15.2)

## 2014-07-25 MED ORDER — WARFARIN SODIUM 4 MG PO TABS
4.0000 mg | ORAL_TABLET | Freq: Once | ORAL | Status: AC
  Administered 2014-07-25: 4 mg via ORAL
  Filled 2014-07-25: qty 1

## 2014-07-25 NOTE — Evaluation (Signed)
Physical Therapy Evaluation Patient Details Name: Bianca Mueller MRN: [id number] DOB: 05-06-1926 Today's Date: 07/25/2014   History of Present Illness  pt presents with NSTEMI.    Clinical Impression  Pt ambulates without A and per Nsg staff has been ambulating with Nsg.  Pt seems to be returning to her baseline level and will have support from family at D/C.  No further PT needs at this time, will sign off.      Follow Up Recommendations No PT follow up;Supervision - Intermittent    Equipment Recommendations  None recommended by PT    Recommendations for Other Services       Precautions / Restrictions Precautions Precautions: None Restrictions Weight Bearing Restrictions: No      Mobility  Bed Mobility               General bed mobility comments: pt up in chair.    Transfers Overall transfer level: Modified independent Equipment used: None             General transfer comment: pt needs UE support, but no A needed.    Ambulation/Gait Ambulation/Gait assistance: Supervision Ambulation Distance (Feet): 150 Feet Assistive device: None (IV pole) Gait Pattern/deviations: Step-through pattern;Decreased stride length     General Gait Details: pt able to ambulate without UE support, but states feeling more comfortable using IV pole as she still feels weaker than baseline.    Stairs            Wheelchair Mobility    Modified Rankin (Stroke Patients Only)       Balance Overall balance assessment: Needs assistance         Standing balance support: No upper extremity supported Standing balance-Leahy Scale: Fair                               Pertinent Vitals/Pain Pain Assessment: No/denies pain    Home Living Family/patient expects to be discharged to:: Private residence Living Arrangements: Spouse/significant other Available Help at Discharge: Family;Available 24 hours/day Type of Home: House Home Access: Stairs to  enter Entrance Stairs-Rails: None Entrance Stairs-Number of Steps: 1 Home Layout: One level Home Equipment: None Additional Comments: pt states her daughter and granddaughters live near by and will be checking on her daily.      Prior Function Level of Independence: Independent               Hand Dominance        Extremity/Trunk Assessment   Upper Extremity Assessment: Overall WFL for tasks assessed           Lower Extremity Assessment: Overall WFL for tasks assessed      Cervical / Trunk Assessment: Normal  Communication   Communication: No difficulties  Cognition Arousal/Alertness: Awake/alert Behavior During Therapy: WFL for tasks assessed/performed Overall Cognitive Status: Within Functional Limits for tasks assessed                      General Comments      Exercises        Assessment/Plan    PT Assessment Patent does not need any further PT services  PT Diagnosis Difficulty walking   PT Problem List    PT Treatment Interventions     PT Goals (Current goals can be found in the Care Plan section) Acute Rehab PT Goals PT Goal Formulation: All assessment and education complete, DC therapy  Frequency     Barriers to discharge        Co-evaluation               End of Session   Activity Tolerance: Patient tolerated treatment well Patient left: in chair;with call bell/phone within reach Nurse Communication: Mobility status         Time: 5003-7048 PT Time Calculation (min): 11 min   Charges:   PT Evaluation $Initial PT Evaluation Tier I: 1 Procedure     PT G CodesCatarina Hartshorn, [place] 07/25/2014, 9:05 AM

## 2014-07-25 NOTE — Progress Notes (Signed)
CARDIAC REHAB PHASE I   PRE:  Rate/Rhythm: 123 aflutter  BP:  Supine:   Sitting:   Standing: 128/75   SaO2: 98 RA  MODE:  Ambulation: 350 ft   POST:  Rate/Rhythem: 132 aflutter  BP:  Supine:   Sitting: 146/75  Standing:    SaO2: 99 RA  Pt ambulated 350 ft with assist x1 using rolling walker.  Pt tolerated walk well.  Given afib booklet to reviewed.  Pt encouarged to walk with staff over weekend.  Pt returned to chair after walk with family in room.  We will f/u on Monday. Alberteen Sam, MA, ACSM RCEP [phone]  Clotilde Dieter

## 2014-07-25 NOTE — Progress Notes (Signed)
ANTICOAGULATION CONSULT NOTE - Follow Up Consult  Pharmacy Consult for heparin Indication: atrial fibrillation and NSTEMI  Labs:  Recent Labs  07/23/14 0353 07/23/14 0912  07/24/14 0200 07/25/14 0432 07/25/14 1750 07/25/14 2317  HGB 11.3*  --   --  11.5* 11.5*  --   --   HCT 34.3*  --   --  34.6* 34.6*  --   --   PLT 322  --   --  343 339  --   --   LABPROT  --   --   --  15.9* 16.4*  --   --   INR  --   --   --  1.26 1.31  --   --   HEPARINUNFRC  --   --   < > 0.57 0.76* 0.11* 0.58  CREATININE  --  0.61  --  0.58 0.66  --   --   < > = values in this interval not displayed.   Assessment/Plan:  80yo female therapeutic on heparin after resumed from being off unexpectedly. Will continue gtt at current rate and confirm stable with am labs.   Wynona Neat, PharmD, BCPS  07/25/2014,11:51 PM

## 2014-07-25 NOTE — Plan of Care (Signed)
Problem: Phase II Progression Outcomes Goal: Anginal pain relieved Outcome: Completed/Met Date Met:  07/25/14  Comments:  yes

## 2014-07-25 NOTE — Progress Notes (Addendum)
ANTICOAGULATION CONSULT NOTE - Follow Up Consult  Pharmacy Consult for Heparin and Coumadin Indication: atrial fibrillation, NSTEMI  Allergies  Allergen Reactions  . Alendronate Sodium   . Isosorbide   . Metformin And Related     Decreases appetite resulting in weight loss  . Metronidazole     REACTION: sick/ GI  . Nifedipine     Patient Measurements: Height: 5\' 2"  (157.5 cm) Weight: 119 lb 0.8 oz (54 kg) IBW/kg (Calculated) : 50.1 Heparin Dosing Weight: 52kg  Vital Signs: Temp: 97.3 F (36.3 C) (11/07 0545) Temp Source: Oral (11/07 0545) BP: 113/62 mmHg (11/07 0545) Pulse Rate: 79 (11/07 0545)  Labs:  Recent Labs  07/23/14 0353 07/23/14 0912 07/23/14 0930 07/24/14 0200 07/25/14 0432  HGB 11.3*  --   --  11.5* 11.5*  HCT 34.3*  --   --  34.6* 34.6*  PLT 322  --   --  343 339  LABPROT  --   --   --  15.9* 16.4*  INR  --   --   --  1.26 1.31  HEPARINUNFRC  --   --  0.57 0.57 0.76*  CREATININE  --  0.61  --  0.58 0.66    Estimated Creatinine Clearance: 40.7 mL/min (by C-G formula based on Cr of 0.66).   Medications:  Heparin 1000 units/hr  Assessment: Bianca Mueller s/p cath for NSTEMI on heparin bridging to Coumadin for new onset Afib/flutter. Heparin drip was decreased this AM for elevated level (0.76) - will follow-up 1430 heparin level. INR (1.31) remains subtherapeutic as expected but has not responded to Coumadin 2.5mg  x 2 - will increase dose tonight but remain conservative due to patient age and possible liver dysfunction (AST/ALT improving).  - H/H and Plts stable - No significant bleeding reported  Goal of Therapy:  INR 2-3 Heparin level 0.3-0.7 units/ml Monitor platelets by anticoagulation protocol: Yes   Plan:  1. Continue Heparin drip 1000 units/hr (10 ml/hr) - will follow-up level @ 1430 today 2. Coumadin 4mg  po x 1 today 3. Daily INR, heparin level and CBC  Bianca Mueller  [phone] 07/25/2014,11:13 AM  Addendum: Heparin level reported  as 0.11. Spoke with RN - he reports finding the pump off while patient was in hall ~2 hours prior to level drawn, unsure of how long heparin infusion had been off. RN restarted heparin infusion at previous rate ~1600 - will continue current rate and check follow-up level tonight.   Plan: 1. Continue heparin 1000 units/hr 2. Check heparin level @ [place], PharmD Clinical Pharmacist [phone] 07/25/2014, 7:26 PM

## 2014-07-25 NOTE — Progress Notes (Signed)
ANTICOAGULATION CONSULT NOTE - Follow Up Consult  Pharmacy Consult for heparin Indication: atrial fibrillation and NSTEMI  Labs:  Recent Labs  07/23/14 0353 07/23/14 0912 07/23/14 0930 07/24/14 0200 07/25/14 0432  HGB 11.3*  --   --  11.5* 11.5*  HCT 34.3*  --   --  34.6* 34.6*  PLT 322  --   --  343 339  LABPROT  --   --   --  15.9* 16.4*  INR  --   --   --  1.26 1.31  HEPARINUNFRC  --   --  0.57 0.57 0.76*  CREATININE  --  0.61  --  0.58 0.66    Assessment: 80yo female now supratherapeutic on heparin after several levels at goal though had been trending up.  Goal of Therapy:  Heparin level 0.3-0.7 units/ml   Plan:  Will decrease heparin gtt slightly to 1000 units/hr and check level in [address], PharmD, BCPS  07/25/2014,6:32 AM

## 2014-07-25 NOTE — Progress Notes (Signed)
Patient ID: ANNIKA SELKE, female   DOB: [birth year], 80 y.o.   MRN: [id number]   SUBJECTIVE: Wants to go home .  OBJECTIVE:   Vitals:   Filed Vitals:   07/24/14 1300 07/24/14 1400 07/24/14 2008 07/25/14 0545  BP: 126/69 125/72 134/66 113/62  Pulse:   98 79  Temp:   98.6 F (37 C) 97.3 F (36.3 C)  TempSrc:   Oral Oral  Resp:   18 18  Height:      Weight:    54 kg (119 lb 0.8 oz)  SpO2:   97% 98%   I&O's:    Intake/Output Summary (Last 24 hours) at 07/25/14 1123 Last data filed at 07/25/14 1194  Gross per 24 hour  Intake    242 ml  Output    150 ml  Net     92 ml   TELEMETRY: Reviewed telemetry pt in A flutter with rate 120's   PHYSICAL EXAM  General: frail elderly female  Head:   Normal cephalic and atraumatic Lungs:  inspitory crackles bilaterally  Heart:  irregularly irregular tachycardiac, No JVD.   Abdomen: abdomen soft and non-tender Extremities:  No edema.   Neuro: Alert and oriented. Psych:  Normal affect, responds appropriately   LABS: Basic Metabolic Panel:  Recent Labs  07/24/14 0200 07/25/14 0432  NA 140 137  K 3.9 3.9  CL 102 100  CO2 23 25  GLUCOSE 188* 162*  BUN 12 15  CREATININE 0.58 0.66  CALCIUM 8.5 8.6   Liver Function Tests:  Recent Labs  07/24/14 0200 07/25/14 0432  AST 34 29  ALT 208* 134*  ALKPHOS 80 79  BILITOT 0.7 0.5  PROT 6.3 6.3  ALBUMIN 2.7* 2.7*   CBC:  Recent Labs  07/24/14 0200 07/25/14 0432  WBC 8.0 9.9  HGB 11.5* 11.5*  HCT 34.6* 34.6*  MCV 86.5 87.2  PLT 343 339   Coag Panel:   Lab Results  Component Value Date   INR 1.31 07/25/2014   INR 1.26 07/24/2014   INR 1.44 07/22/2014    RADIOLOGY: US Abdomen Port  07/20/2014   CLINICAL DATA:  Diffuse gallbladder wall thickening on a recent abdomen CTA.  EXAM: ULTRASOUND PORTABLE ABDOMEN  COMPARISON:  Abdomen CTA dated 07/18/2014.  FINDINGS: Gallbladder: No gallstones or wall thickening visualized. No sonographic Murphy sign noted.  Common bile duct:  Diameter: 6.5 mm  Liver: No focal lesion identified. Within normal limits in parenchymal echogenicity.  IVC: No abnormality visualized.  Pancreas: Visualized portion unremarkable.  Spleen: Size and appearance within normal limits.  Right Kidney: Length: 10.4 cm. Echogenicity within normal limits. No mass or hydronephrosis visualized.  Left Kidney: Length: 10.1 cm. Echogenicity within normal limits. No mass or hydronephrosis visualized.  Abdominal aorta: No aneurysm visualized.  Other findings: Small bilateral pleural effusions.  IMPRESSION: 1. The gallbladder has a normal appearance today with no gallstones or wall thickening. 2. Small bilateral pleural effusions.   Electronically Signed   By: Enrique Sack M.D.   On: 07/20/2014 17:05   Dg Chest Port 1 View  07/18/2014   CLINICAL DATA:  Nausea vomiting and decreased oxygen saturation. Former smoker.  EXAM: PORTABLE CHEST - 1 VIEW  COMPARISON:  04/04/2014.  FINDINGS: Mild cardiac enlargement. Atherosclerotic disease is noted within the aortic arch. There are 2 nodular densities in the left base that are indeterminate. No pleural effusion identified. Right lung is clear.  IMPRESSION: 1. Two nodular opacities are identified in the left base.  In this patient who has a history of smoking a initial further workup with a dedicated PA and lateral upright radiographs are recommended.   Electronically Signed   By: Kerby Moors M.D.   On: 07/18/2014 19:25   Ct Angio Chest Aorta W/cm &/or Wo/cm  07/18/2014   CLINICAL DATA:  Nausea and vomiting.  Intermittent upper back pain.  EXAM: CT ANGIOGRAPHY CHEST, ABDOMEN AND PELVIS  TECHNIQUE: Multidetector CT imaging through the chest, abdomen and pelvis was performed using the standard protocol during bolus administration of intravenous contrast. Multiplanar reconstructed images and MIPs were obtained and reviewed to evaluate the vascular anatomy.  CONTRAST:  19m OMNIPAQUE IOHEXOL 350 MG/ML SOLN  COMPARISON:  None.  FINDINGS:  CTA CHEST FINDINGS  Mediastinum: The heart size is normal. There is a small to moderate pericardial effusion identified. There is calcified atherosclerotic disease involving the thoracic aorta. No evidence for 8 dissection. The main pulmonary artery appears patent there is no abnormal filling defects within the main pulmonary artery or its branches.  Lungs/Pleura: There is atelectasis/ scarring identified within the lung bases. A calcified granuloma is noted in the left base. No airspace consolidation.  Musculoskeletal: There is mild spondylosis identified within the thoracic spine.  Review of the MIP images confirms the above findings.  CTA ABDOMEN AND PELVIS FINDINGS  Hepatobiliary: Normal appearance of the liver. There is diffuse gallbladder wall thickening measuring up to 8 mm. There is no biliary dilatation.  Pancreas: Normal appearance of the pancreas.  Spleen: The spleen is unremarkable.  Adrenals/Urinary Tract: The adrenal glands are both normal. There is mild bilateral renal cortical thinning. The urinary bladder is within normal limits.  Stomach/Bowel: Small hiatal hernia. The stomach is otherwise unremarkable. The small bowel loops have a normal course and caliber without obstruction. The appendix is visualized and appears normal. Multiple distal colonic diverticula identified without acute inflammation.  Vascular/Lymphatic: There is calcified atherosclerotic disease involving the normal caliber abdominal aorta. There is no dissection. No retroperitoneal adenopathy.  Reproductive: Previous hysterectomy. The adnexal structures appear normal.  Other: There is no free fluid or fluid collections within the abdomen or pelvis.  Musculoskeletal: Review of the visualized osseous structures is significant for degenerative disc disease within the L4-5 level.  Review of the MIP images confirms the above findings.  IMPRESSION: 1. No evidence for aortic dissection. 2. Atherosclerotic disease. 3. Gallbladder wall  thickening. If there are clinical signs or symptoms of cholecystitis consider further investigation with abdominal sonogram. 4. Hiatal hernia 5. Thoracic and lumbar degenerative disc disease.   Electronically Signed   By: TKerby MoorsM.D.   On: 07/18/2014 20:35   Ct Cta Abd/pel W/cm &/or W/o Cm  07/18/2014   CLINICAL DATA:  Nausea and vomiting.  Intermittent upper back pain.  EXAM: CT ANGIOGRAPHY CHEST, ABDOMEN AND PELVIS  TECHNIQUE: Multidetector CT imaging through the chest, abdomen and pelvis was performed using the standard protocol during bolus administration of intravenous contrast. Multiplanar reconstructed images and MIPs were obtained and reviewed to evaluate the vascular anatomy.  CONTRAST:  1083mOMNIPAQUE IOHEXOL 350 MG/ML SOLN  COMPARISON:  None.  FINDINGS: CTA CHEST FINDINGS  Mediastinum: The heart size is normal. There is a small to moderate pericardial effusion identified. There is calcified atherosclerotic disease involving the thoracic aorta. No evidence for 8 dissection. The main pulmonary artery appears patent there is no abnormal filling defects within the main pulmonary artery or its branches.  Lungs/Pleura: There is atelectasis/ scarring identified within the lung bases.  A calcified granuloma is noted in the left base. No airspace consolidation.  Musculoskeletal: There is mild spondylosis identified within the thoracic spine.  Review of the MIP images confirms the above findings.  CTA ABDOMEN AND PELVIS FINDINGS  Hepatobiliary: Normal appearance of the liver. There is diffuse gallbladder wall thickening measuring up to 8 mm. There is no biliary dilatation.  Pancreas: Normal appearance of the pancreas.  Spleen: The spleen is unremarkable.  Adrenals/Urinary Tract: The adrenal glands are both normal. There is mild bilateral renal cortical thinning. The urinary bladder is within normal limits.  Stomach/Bowel: Small hiatal hernia. The stomach is otherwise unremarkable. The small bowel loops  have a normal course and caliber without obstruction. The appendix is visualized and appears normal. Multiple distal colonic diverticula identified without acute inflammation.  Vascular/Lymphatic: There is calcified atherosclerotic disease involving the normal caliber abdominal aorta. There is no dissection. No retroperitoneal adenopathy.  Reproductive: Previous hysterectomy. The adnexal structures appear normal.  Other: There is no free fluid or fluid collections within the abdomen or pelvis.  Musculoskeletal: Review of the visualized osseous structures is significant for degenerative disc disease within the L4-5 level.  Review of the MIP images confirms the above findings.  IMPRESSION: 1. No evidence for aortic dissection. 2. Atherosclerotic disease. 3. Gallbladder wall thickening. If there are clinical signs or symptoms of cholecystitis consider further investigation with abdominal sonogram. 4. Hiatal hernia 5. Thoracic and lumbar degenerative disc disease.   Electronically Signed   By: Kerby Moors M.D.   On: 07/18/2014 20:35      ASSESSMENT:  NSTEMI Atrial Flutter Transaminitis (normal alk phos) HTN T2DM Leukocytosis (Trending down)/ Acute Sinusitus Nausea   PLAN:   NSTEMI>>> cath results show diffusely diseased right and left coronary arteris not easily stentable.  Recommend dual antiplatlet therapy for at least 1 month, if angina returns then consider PCI to RCA.Marland Kitchen  Atrial Flutter/ A fib >>>  - on long acting cardizem 360 daily  - Metoprolol to 75m BID - Warfarin per pharmacy INR  1.3 today - Continue Heparin - may need amiodarone if she continues to have paroxysms    Hypokalemia> repleted and resolved  Transaminitis >> holding statin, LFTs improving.  Possibly secondary to ischemia versus drug induced (?doxycyline> now discontinued).  AST down to normal limits with ALT still mildly elevated.  Patient will need follow up with [facility] after discharge  HTN >> BP 130/78 this AM.   On PO cardiazem and increasing metoprolol to 25 BID.    T2DM >>  Mildly hyperglycemic., minimal need for SSI  Sinusitus>>> augmentin>  Last dose today   Global: Transfer to Telemetry today.  Will place PT/OT consult.  PJenkins Rouge MD

## 2014-07-26 LAB — CBC
HEMATOCRIT: 37.2 % (ref 36.0–46.0)
HEMOGLOBIN: 12.2 g/dL (ref 12.0–15.0)
MCH: 29.1 pg (ref 26.0–34.0)
MCHC: 32.8 g/dL (ref 30.0–36.0)
MCV: 88.8 fL (ref 78.0–100.0)
Platelets: 325 10*3/uL (ref 150–400)
RBC: 4.19 MIL/uL (ref 3.87–5.11)
RDW: 15.1 % (ref 11.5–15.5)
WBC: 12.8 10*3/uL — ABNORMAL HIGH (ref 4.0–10.5)

## 2014-07-26 LAB — GLUCOSE, CAPILLARY
Glucose-Capillary: 266 mg/dL — ABNORMAL HIGH (ref 70–99)
Glucose-Capillary: 138 mg/dL — ABNORMAL HIGH (ref 70–99)
Glucose-Capillary: 194 mg/dL — ABNORMAL HIGH (ref 70–99)

## 2014-07-26 LAB — PROTIME-INR
INR: 1.25 (ref 0.00–1.49)
Prothrombin Time: 15.8 seconds — ABNORMAL HIGH (ref 11.6–15.2)

## 2014-07-26 LAB — HEPARIN LEVEL (UNFRACTIONATED): HEPARIN UNFRACTIONATED: 0.67 [IU]/mL (ref 0.30–0.70)

## 2014-07-26 MED ORDER — WARFARIN VIDEO: Freq: Once | Status: DC

## 2014-07-26 MED ORDER — COUMADIN BOOK
Freq: Once | Status: AC
  Administered 2014-07-26: 21:00:00
  Filled 2014-07-26: qty 1

## 2014-07-26 MED ORDER — WARFARIN SODIUM 4 MG PO TABS
8.0000 mg | ORAL_TABLET | Freq: Once | ORAL | Status: AC
  Administered 2014-07-26: 8 mg via ORAL
  Filled 2014-07-26: qty 2

## 2014-07-26 NOTE — Progress Notes (Signed)
  ANTICOAGULATION CONSULT NOTE - Follow Up Consult  Pharmacy Consult for Heparin and Coumadin Indication: Afib, NSTEMI  Allergies  Allergen Reactions  . Alendronate Sodium   . Isosorbide   . Metformin And Related     Decreases appetite resulting in weight loss  . Metronidazole     REACTION: sick/ GI  . Nifedipine     Patient Measurements: Height: 5\' 2"  (157.5 cm) Weight: 120 lb 2.4 oz (54.5 kg) IBW/kg (Calculated) : 50.1 Heparin Dosing Weight: 52kg  Vital Signs: Temp: 97.4 F (36.3 C) (11/08 0448) Temp Source: Oral (11/08 0448) BP: 145/73 mmHg (11/08 0448) Pulse Rate: 102 (11/08 0448)  Labs:  Recent Labs  07/24/14 0200 07/25/14 0432 07/25/14 1750 07/25/14 2317 07/26/14 0527  HGB 11.5* 11.5*  --   --  12.2  HCT 34.6* 34.6*  --   --  37.2  PLT 343 339  --   --  325  LABPROT 15.9* 16.4*  --   --  15.8*  INR 1.26 1.31  --   --  1.25  HEPARINUNFRC 0.57 0.76* 0.11* 0.58 0.67  CREATININE 0.58 0.66  --   --   --     Estimated Creatinine Clearance: 40.7 mL/min (by C-G formula based on Cr of 0.66).   Medications:  Heparin 1000 units/hr  Assessment: 87yof s/p cath for NSTEMI on heparin bridging to Coumadin for new onset Afib/flutter. Heparin level (0.67) remains therapeutic at upper end of goal range - will decrease rate slightly to keep from going supratherapeutic. INR (1.25) has not responded to 2.5mg  and 4mg  doses of Coumadin - MD Johnsie Cancel has ordered Coumadin 8mg  tonight. - H/H and Plts stable - No significant bleeding reported  Goal of Therapy:  INR 2-3 Heparin level 0.3-0.7 units/ml Monitor platelets by anticoagulation protocol: Yes   Plan:  1. Decrease heparin drip to 950 units/hr (9.5 ml/hr) 2. Per MD order - Coumadin 8mg  PO x 1 today 3. Daily INR, heparin level and CBC  [name]  [phone] 07/26/2014,2:26 PM

## 2014-07-26 NOTE — Progress Notes (Signed)
Patient ID: Bianca Mueller, female   DOB: 1926/02/11, 80 y.o.   MRN: [id number]   SUBJECTIVE: No complaints  IV in RUE a bit finicky   OBJECTIVE:   Vitals:   Filed Vitals:   07/25/14 2028 07/26/14 0104 07/26/14 0448 07/26/14 0700  BP: 135/49  145/73   Pulse: 75  102   Temp: 97.5 F (36.4 C) 98.4 F (36.9 C) 97.4 F (36.3 C)   TempSrc: Oral Oral Oral   Resp: 18  18   Height:      Weight:    54.5 kg (120 lb 2.4 oz)  SpO2: 100%  94%    I&O's:    Intake/Output Summary (Last 24 hours) at 07/26/14 1036 Last data filed at 07/26/14 0818  Gross per 24 hour  Intake    600 ml  Output      0 ml  Net    600 ml   TELEMETRY: Reviewed telemetry pt in A flutter with rate 120's   PHYSICAL EXAM  General: frail elderly female  Head:   Normal cephalic and atraumatic Lungs:  inspitory crackles bilaterally  Heart:  irregularly irregular tachycardiac, No JVD.   Abdomen: abdomen soft and non-tender Extremities:  No edema.   Neuro: Alert and oriented. Psych:  Normal affect, responds appropriately   LABS: Basic Metabolic Panel:  Recent Labs  07/24/14 0200 07/25/14 0432  NA 140 137  K 3.9 3.9  CL 102 100  CO2 23 25  GLUCOSE 188* 162*  BUN 12 15  CREATININE 0.58 0.66  CALCIUM 8.5 8.6   Liver Function Tests:  Recent Labs  07/24/14 0200 07/25/14 0432  AST 34 29  ALT 208* 134*  ALKPHOS 80 79  BILITOT 0.7 0.5  PROT 6.3 6.3  ALBUMIN 2.7* 2.7*   CBC:  Recent Labs  07/25/14 0432 07/26/14 0527  WBC 9.9 12.8*  HGB 11.5* 12.2  HCT 34.6* 37.2  MCV 87.2 88.8  PLT 339 325   Coag Panel:   Lab Results  Component Value Date   INR 1.25 07/26/2014   INR 1.31 07/25/2014   INR 1.26 07/24/2014    RADIOLOGY: US Abdomen Port  07/20/2014   CLINICAL DATA:  Diffuse gallbladder wall thickening on a recent abdomen CTA.  EXAM: ULTRASOUND PORTABLE ABDOMEN  COMPARISON:  Abdomen CTA dated 07/18/2014.  FINDINGS: Gallbladder: No gallstones or wall thickening visualized. No sonographic  Murphy sign noted.  Common bile duct: Diameter: 6.5 mm  Liver: No focal lesion identified. Within normal limits in parenchymal echogenicity.  IVC: No abnormality visualized.  Pancreas: Visualized portion unremarkable.  Spleen: Size and appearance within normal limits.  Right Kidney: Length: 10.4 cm. Echogenicity within normal limits. No mass or hydronephrosis visualized.  Left Kidney: Length: 10.1 cm. Echogenicity within normal limits. No mass or hydronephrosis visualized.  Abdominal aorta: No aneurysm visualized.  Other findings: Small bilateral pleural effusions.  IMPRESSION: 1. The gallbladder has a normal appearance today with no gallstones or wall thickening. 2. Small bilateral pleural effusions.   Electronically Signed   By: Enrique Sack M.D.   On: 07/20/2014 17:05   Dg Chest Port 1 View  07/18/2014   CLINICAL DATA:  Nausea vomiting and decreased oxygen saturation. Former smoker.  EXAM: PORTABLE CHEST - 1 VIEW  COMPARISON:  04/04/2014.  FINDINGS: Mild cardiac enlargement. Atherosclerotic disease is noted within the aortic arch. There are 2 nodular densities in the left base that are indeterminate. No pleural effusion identified. Right lung is clear.  IMPRESSION: 1.  Two nodular opacities are identified in the left base. In this patient who has a history of smoking a initial further workup with a dedicated PA and lateral upright radiographs are recommended.   Electronically Signed   By: Kerby Moors M.D.   On: 07/18/2014 19:25   Ct Angio Chest Aorta W/cm &/or Wo/cm  07/18/2014   CLINICAL DATA:  Nausea and vomiting.  Intermittent upper back pain.  EXAM: CT ANGIOGRAPHY CHEST, ABDOMEN AND PELVIS  TECHNIQUE: Multidetector CT imaging through the chest, abdomen and pelvis was performed using the standard protocol during bolus administration of intravenous contrast. Multiplanar reconstructed images and MIPs were obtained and reviewed to evaluate the vascular anatomy.  CONTRAST:  127m OMNIPAQUE IOHEXOL 350  MG/ML SOLN  COMPARISON:  None.  FINDINGS: CTA CHEST FINDINGS  Mediastinum: The heart size is normal. There is a small to moderate pericardial effusion identified. There is calcified atherosclerotic disease involving the thoracic aorta. No evidence for 8 dissection. The main pulmonary artery appears patent there is no abnormal filling defects within the main pulmonary artery or its branches.  Lungs/Pleura: There is atelectasis/ scarring identified within the lung bases. A calcified granuloma is noted in the left base. No airspace consolidation.  Musculoskeletal: There is mild spondylosis identified within the thoracic spine.  Review of the MIP images confirms the above findings.  CTA ABDOMEN AND PELVIS FINDINGS  Hepatobiliary: Normal appearance of the liver. There is diffuse gallbladder wall thickening measuring up to 8 mm. There is no biliary dilatation.  Pancreas: Normal appearance of the pancreas.  Spleen: The spleen is unremarkable.  Adrenals/Urinary Tract: The adrenal glands are both normal. There is mild bilateral renal cortical thinning. The urinary bladder is within normal limits.  Stomach/Bowel: Small hiatal hernia. The stomach is otherwise unremarkable. The small bowel loops have a normal course and caliber without obstruction. The appendix is visualized and appears normal. Multiple distal colonic diverticula identified without acute inflammation.  Vascular/Lymphatic: There is calcified atherosclerotic disease involving the normal caliber abdominal aorta. There is no dissection. No retroperitoneal adenopathy.  Reproductive: Previous hysterectomy. The adnexal structures appear normal.  Other: There is no free fluid or fluid collections within the abdomen or pelvis.  Musculoskeletal: Review of the visualized osseous structures is significant for degenerative disc disease within the L4-5 level.  Review of the MIP images confirms the above findings.  IMPRESSION: 1. No evidence for aortic dissection. 2.  Atherosclerotic disease. 3. Gallbladder wall thickening. If there are clinical signs or symptoms of cholecystitis consider further investigation with abdominal sonogram. 4. Hiatal hernia 5. Thoracic and lumbar degenerative disc disease.   Electronically Signed   By: TKerby MoorsM.D.   On: 07/18/2014 20:35   Ct Cta Abd/pel W/cm &/or W/o Cm  07/18/2014   CLINICAL DATA:  Nausea and vomiting.  Intermittent upper back pain.  EXAM: CT ANGIOGRAPHY CHEST, ABDOMEN AND PELVIS  TECHNIQUE: Multidetector CT imaging through the chest, abdomen and pelvis was performed using the standard protocol during bolus administration of intravenous contrast. Multiplanar reconstructed images and MIPs were obtained and reviewed to evaluate the vascular anatomy.  CONTRAST:  1069mOMNIPAQUE IOHEXOL 350 MG/ML SOLN  COMPARISON:  None.  FINDINGS: CTA CHEST FINDINGS  Mediastinum: The heart size is normal. There is a small to moderate pericardial effusion identified. There is calcified atherosclerotic disease involving the thoracic aorta. No evidence for 8 dissection. The main pulmonary artery appears patent there is no abnormal filling defects within the main pulmonary artery or its branches.  Lungs/Pleura:  There is atelectasis/ scarring identified within the lung bases. A calcified granuloma is noted in the left base. No airspace consolidation.  Musculoskeletal: There is mild spondylosis identified within the thoracic spine.  Review of the MIP images confirms the above findings.  CTA ABDOMEN AND PELVIS FINDINGS  Hepatobiliary: Normal appearance of the liver. There is diffuse gallbladder wall thickening measuring up to 8 mm. There is no biliary dilatation.  Pancreas: Normal appearance of the pancreas.  Spleen: The spleen is unremarkable.  Adrenals/Urinary Tract: The adrenal glands are both normal. There is mild bilateral renal cortical thinning. The urinary bladder is within normal limits.  Stomach/Bowel: Small hiatal hernia. The stomach is  otherwise unremarkable. The small bowel loops have a normal course and caliber without obstruction. The appendix is visualized and appears normal. Multiple distal colonic diverticula identified without acute inflammation.  Vascular/Lymphatic: There is calcified atherosclerotic disease involving the normal caliber abdominal aorta. There is no dissection. No retroperitoneal adenopathy.  Reproductive: Previous hysterectomy. The adnexal structures appear normal.  Other: There is no free fluid or fluid collections within the abdomen or pelvis.  Musculoskeletal: Review of the visualized osseous structures is significant for degenerative disc disease within the L4-5 level.  Review of the MIP images confirms the above findings.  IMPRESSION: 1. No evidence for aortic dissection. 2. Atherosclerotic disease. 3. Gallbladder wall thickening. If there are clinical signs or symptoms of cholecystitis consider further investigation with abdominal sonogram. 4. Hiatal hernia 5. Thoracic and lumbar degenerative disc disease.   Electronically Signed   By: Kerby Moors M.D.   On: 07/18/2014 20:35      ASSESSMENT:  NSTEMI Atrial Flutter Transaminitis (normal alk phos) HTN T2DM Leukocytosis (Trending down)/ Acute Sinusitus Nausea   PLAN:   NSTEMI>>> cath results show diffusely diseased right and left coronary arteris not easily stentable.  Recommend dual antiplatlet therapy for at least 1 month, if angina returns then consider PCI to RCA.Marland Kitchen  Atrial Flutter/ A fib >>>  - on long acting cardizem 360 daily  - Metoprolol to 34m BID - Warfarin per pharmacy INR  1.3 today - Continue Heparin - may need amiodarone if she continues to have paroxysms    Hypokalemia> repleted and resolved  Transaminitis >> holding statin, LFTs improving.  Possibly secondary to ischemia versus drug induced (?doxycyline> now discontinued).  AST down to normal limits with ALT still mildly elevated.  Patient will need follow up with [facility]  after discharge  HTN >> BP 130/78 this AM.  On PO cardiazem and increasing metoprolol to 25 BID.    T2DM >>  Mildly hyperglycemic., minimal need for SSI  Sinusitus>>> augmentin>  Last dose today   Can d/c when INR is over 2  Dosing has been too low will give 8 mg PO today   PJenkins Rouge MD

## 2014-07-26 NOTE — Plan of Care (Signed)
Problem: Phase III Progression Outcomes Goal: Hemodynamically stable Outcome: Completed/Met Date Met:  07/26/14

## 2014-07-27 DIAGNOSIS — I5033 Acute on chronic diastolic (congestive) heart failure: Secondary | ICD-10-CM

## 2014-07-27 LAB — GLUCOSE, CAPILLARY
GLUCOSE-CAPILLARY: 186 mg/dL — AB (ref 70–99)
Glucose-Capillary: 153 mg/dL — ABNORMAL HIGH (ref 70–99)
Glucose-Capillary: 256 mg/dL — ABNORMAL HIGH (ref 70–99)
Glucose-Capillary: 154 mg/dL — ABNORMAL HIGH (ref 70–99)

## 2014-07-27 LAB — HEPARIN LEVEL (UNFRACTIONATED): HEPARIN UNFRACTIONATED: 0.62 [IU]/mL (ref 0.30–0.70)

## 2014-07-27 LAB — CBC
HCT: 31.4 % — ABNORMAL LOW (ref 36.0–46.0)
Hemoglobin: 10.3 g/dL — ABNORMAL LOW (ref 12.0–15.0)
MCH: 28.7 pg (ref 26.0–34.0)
MCHC: 32.8 g/dL (ref 30.0–36.0)
MCV: 87.5 fL (ref 78.0–100.0)
Platelets: 295 10*3/uL (ref 150–400)
RBC: 3.59 MIL/uL — AB (ref 3.87–5.11)
RDW: 15 % (ref 11.5–15.5)
WBC: 10.3 10*3/uL (ref 4.0–10.5)

## 2014-07-27 LAB — PROTIME-INR
INR: 1.68 — ABNORMAL HIGH (ref 0.00–1.49)
Prothrombin Time: 19.9 seconds — ABNORMAL HIGH (ref 11.6–15.2)

## 2014-07-27 MED ORDER — LINAGLIPTIN 5 MG PO TABS
5.0000 mg | ORAL_TABLET | Freq: Every day | ORAL | Status: DC
  Administered 2014-07-27 – 2014-07-29 (×3): 5 mg via ORAL
  Filled 2014-07-27 (×3): qty 1

## 2014-07-27 MED ORDER — GLIPIZIDE ER 10 MG PO TB24
10.0000 mg | ORAL_TABLET | Freq: Every day | ORAL | Status: DC
  Administered 2014-07-28 – 2014-07-29 (×2): 10 mg via ORAL
  Filled 2014-07-27 (×5): qty 1

## 2014-07-27 MED ORDER — DOCUSATE SODIUM 100 MG PO CAPS
100.0000 mg | ORAL_CAPSULE | Freq: Two times a day (BID) | ORAL | Status: DC
  Administered 2014-07-27 – 2014-07-29 (×4): 100 mg via ORAL
  Filled 2014-07-27 (×5): qty 1

## 2014-07-27 MED ORDER — WARFARIN SODIUM 7.5 MG PO TABS
7.5000 mg | ORAL_TABLET | Freq: Once | ORAL | Status: AC
  Administered 2014-07-27: 7.5 mg via ORAL
  Filled 2014-07-27: qty 1

## 2014-07-27 MED ORDER — FUROSEMIDE 10 MG/ML IJ SOLN
40.0000 mg | Freq: Every day | INTRAMUSCULAR | Status: DC
  Administered 2014-07-27 – 2014-07-29 (×3): 40 mg via INTRAVENOUS
  Filled 2014-07-27 (×3): qty 4

## 2014-07-27 MED ORDER — AMIODARONE HCL 200 MG PO TABS
200.0000 mg | ORAL_TABLET | Freq: Two times a day (BID) | ORAL | Status: DC
  Administered 2014-07-27 – 2014-07-29 (×5): 200 mg via ORAL
  Filled 2014-07-27 (×6): qty 1

## 2014-07-27 NOTE — Progress Notes (Signed)
  ANTICOAGULATION CONSULT NOTE - Follow Up Consult  Pharmacy Consult for Heparin and Coumadin Indication: Afib, NSTEMI  Allergies  Allergen Reactions  . Alendronate Sodium   . Isosorbide   . Metformin And Related     Decreases appetite resulting in weight loss  . Metronidazole     REACTION: sick/ GI  . Nifedipine     Patient Measurements: Height: 5\' 2"  (157.5 cm) Weight: 119 lb 14.9 oz (54.4 kg) IBW/kg (Calculated) : 50.1 Heparin Dosing Weight: 52kg  Vital Signs: Temp: 98.7 F (37.1 C) (11/09 0440) Temp Source: Oral (11/09 0440) BP: 122/65 mmHg (11/09 0440) Pulse Rate: 82 (11/09 0440)  Labs:  Recent Labs  07/25/14 0432  07/25/14 2317 07/26/14 0527 07/27/14 0420 07/27/14 0428  HGB 11.5*  --   --  12.2  --  10.3*  HCT 34.6*  --   --  37.2  --  31.4*  PLT 339  --   --  325  --  295  LABPROT 16.4*  --   --  15.8*  --  19.9*  INR 1.31  --   --  1.25  --  1.68*  HEPARINUNFRC 0.76*  < > 0.58 0.67 0.62  --   CREATININE 0.66  --   --   --   --   --   < > = values in this interval not displayed.  Estimated Creatinine Clearance: 40.7 mL/min (by C-G formula based on Cr of 0.66).  Assessment: 87yof s/p cath for NSTEMI on heparin bridging to Coumadin for new onset Afib/flutter. Heparin level (0.62) remains therapeutic at upper end of goal range - INR=1.68 (trending up) - H/H and Plts stable - No significant bleeding reported  Goal of Therapy:  INR 2-3 Heparin level 0.3-0.7 units/ml Monitor platelets by anticoagulation protocol: Yes   Plan:  1. Continue heparin drip at 950 units/hr (9.5 ml/hr) 2. Coumadin 7.5 mg po x 1 dose today 3. Daily INR, heparin level and CBC  Tad Moore  [phone] 07/27/2014,8:46 AM

## 2014-07-27 NOTE — Progress Notes (Signed)
CARDIAC REHAB PHASE I   PRE:  Rate/Rhythm: 108 aflutter    BP: sitting 130/72    SaO2: 97 RA  MODE:  Ambulation: 350 ft   POST:  Rate/Rhythm: 133 aflutter/fib    BP: sitting 150/70     SaO2: 97 RA  Pt steady, didn't need RW. HR increased to 133 aflutter. Tired after walk. Ankles swollen. Pt also sts her hands are swollen. Instructed pt to keep feet elevated at all times when sitting. Got pillow. [phone]   Josephina Shih [place] CES, ACSM 07/27/2014 10:47 AM

## 2014-07-27 NOTE — Progress Notes (Signed)
Subjective: No complaints, up in room, no recent BMs  Objective: Vital signs in last 24 hours: Temp:  [97.8 F (36.6 C)-98.7 F (37.1 C)] 98.7 F (37.1 C) (11/09 0440) Pulse Rate:  [64-82] 82 (11/09 0440) Resp:  [18] 18 (11/09 0440) BP: (110-132)/(61-65) 122/65 mmHg (11/09 0440) SpO2:  [96 %-99 %] 96 % (11/09 0440) Weight:  [119 lb 14.9 oz (54.4 kg)] 119 lb 14.9 oz (54.4 kg) (11/09 0439) Weight change: -3.5 oz (-0.1 kg) Last BM Date: 07/26/14 Intake/Output from previous day: +600 11/08 0701 - 11/09 0700 In: 600 [P.O.:600] Out: -  Intake/Output this shift:    PE: General:Pleasant affect, NAD Skin:Warm and dry, brisk capillary refill HEENT:normocephalic, sclera clear, mucus membranes moist Heart:Irreg irreg without murmur, gallup, rub or click Lungs:clear without rales, rhonchi, or wheezes ZES:PQZR, non tender, + BS, do not palpate liver spleen or masses Ext:1-2+ lower ext edema, 2+ pedal pulses, 2+ radial pulses, also mild hand swelling Neuro:alert and oriented, MAE, follows commands, + facial symmetry   Lab Results:  Recent Labs  07/26/14 0527 07/27/14 0428  WBC 12.8* 10.3  HGB 12.2 10.3*  HCT 37.2 31.4*  PLT 325 295   BMET  Recent Labs  07/25/14 0432  NA 137  K 3.9  CL 100  CO2 25  GLUCOSE 162*  BUN 15  CREATININE 0.66  CALCIUM 8.6   No results for input(s): TROPONINI in the last 72 hours.  Invalid input(s): CK, MB  Lab Results  Component Value Date   CHOL 84 07/19/2014   HDL 24* 07/19/2014   LDLCALC 41 07/19/2014   LDLDIRECT 95.1 08/18/2013   TRIG 94 07/19/2014   CHOLHDL 3.5 07/19/2014   Lab Results  Component Value Date   HGBA1C 7.1* 07/18/2014     Lab Results  Component Value Date   TSH 2.080 07/18/2014    Hepatic Function Panel  Recent Labs  07/25/14 0432  PROT 6.3  ALBUMIN 2.7*  AST 29  ALT 134*  ALKPHOS 79  BILITOT 0.5   No results for input(s): CHOL in the last 72 hours. No results for input(s): PROTIME  in the last 72 hours.     Studies/Results: No results found.  Medications: I have reviewed the patient's current medications. Scheduled Meds: . antiseptic oral rinse  7 mL Mouth Rinse BID  . aspirin EC  81 mg Oral Daily  . clopidogrel  75 mg Oral Q breakfast  . diltiazem  360 mg Oral Daily  . insulin aspart  0-24 Units Subcutaneous TID WC  . metoprolol tartrate  25 mg Oral BID  . pantoprazole  40 mg Oral Daily  . warfarin  7.5 mg Oral ONCE-1800  . warfarin   Does not apply Once  . Warfarin - Pharmacist Dosing Inpatient   Does not apply q1800   Continuous Infusions: . heparin 950 Units/hr (07/26/14 1839)   PRN Meds:.acetaminophen, alum & mag hydroxide-simeth, fentaNYL, nitroGLYCERIN, ondansetron (ZOFRAN) IV, oxyCODONE-acetaminophen, prochlorperazine  Assessment/Plan: NSTEMI>>> cath results show diffusely diseased right and left coronary arteris not easily stentable. Recommend dual antiplatlet therapy for at least 1 month, if angina returns then consider PCI to RCA.Marland Kitchen No chest pain today.  Atrial Flutter/ A fib >>> currently in a flutter HR 50 to 103 - on long acting cardizem 360 daily  - Metoprolol to 25mg  BID - Warfarin per pharmacy INR 1.68 today - Continue Heparin - may need amiodarone if she continues to have paroxysms place now?  Hypokalemia> repleted and resolved will recheck in am  Transaminitis >> holding statin, LFTs improving. Possibly secondary to ischemia versus drug induced (?doxycyline> now discontinued). AST down to normal limits with ALT still mildly elevated. Patient will need follow up with [facility] after discharge will check in am jwith CMP  HTN >> BP 122/65 this AM. On PO cardiazem and increasing metoprolol to 25 BID.   T2DM >> Mildly hyperglycemic., minimal need for SSI will resume home meds of glipizide and januvia  Sinusitus>>> augmentin> Last dose yesterday   Anemia, slow drift down, now 10.3 was 11.5   Can d/c when INR is over  2--currently is 1.68 Dosing has been too low was given 8 mg PO yesterday    LOS: 9 days   Time spent with pt. :15 minutes. [facility] R  Nurse Practitioner Certified Pager [phone] or after 5pm and on weekends call [phone] 07/27/2014, 9:05 AM   I have personally seen and examined this patient with [name], NP. I agree with the assessment and plan as outlined above. Will add amiodarone 200 mg po BID. Will follow INR and plan d/c when INR is over 2.0. Will need to consider whether we should stop ASA and just continue coumadin and Plavix after INR is therapeutic. Will give IV Lasix today for volume overload.   MCALHANY,CHRISTOPHER 07/27/2014 11:33 AM

## 2014-07-27 NOTE — Progress Notes (Signed)
Occupational Therapy Evaluation Patient Details Name: ANGELLYNN KIMBERLIN MRN: [id number] DOB: [birth year] Today's Date: 07/27/2014    History of Present Illness pt presents with NSTEMI.     Clinical Impression   PTA, pt very active and independent with ADL and mobility. Pt close to baseline functional level. Completed all education regarding home safety, reducing falls and energy conservation. Pt safe to D/C home with intermittent S when medically stable. OT signing off.    Follow Up Recommendations  No OT follow up;Supervision - Intermittent    Equipment Recommendations  None recommended by OT    Recommendations for Other Services       Precautions / Restrictions Precautions Precautions: None      Mobility Bed Mobility Overal bed mobility: Independent                Transfers Overall transfer level: Independent                    Balance Overall balance assessment: No apparent balance deficits (not formally assessed)                                          ADL Overall ADL's : At baseline                                       General ADL Comments: Discussed recommendations to uase shower seat when bathing and energy conservation techniques. Pt verbalized understanding.     Vision                     Perception     Praxis      Pertinent Vitals/Pain Pain Assessment: No/denies pain     Hand Dominance     Extremity/Trunk Assessment Upper Extremity Assessment Upper Extremity Assessment: Overall WFL for tasks assessed   Lower Extremity Assessment Lower Extremity Assessment: Overall WFL for tasks assessed   Cervical / Trunk Assessment Cervical / Trunk Assessment: Normal   Communication Communication Communication: No difficulties   Cognition Arousal/Alertness: Awake/alert Behavior During Therapy: WFL for tasks assessed/performed Overall Cognitive Status: Within Functional Limits for tasks  assessed                     General Comments       Exercises       Shoulder Instructions      Home Living Family/patient expects to be discharged to:: Private residence Living Arrangements: Spouse/significant other Available Help at Discharge: Family;Available 24 hours/day Type of Home: House Home Access: Stairs to enter [employer] of Steps: 1 Entrance Stairs-Rails: None Home Layout: One level     Bathroom Shower/Tub: Occupational psychologist: Handicapped height Bathroom Accessibility: Yes How Accessible: Accessible via walker Home Equipment: Shower seat   Additional Comments: pt states her daughter and granddaughters live near by and will be checking on her daily.        Prior Functioning/Environment Level of Independence: Independent        Comments: mowed lawns; drives    OT Diagnosis:     OT Problem List:     OT Treatment/Interventions:      OT Goals(Current goals can be found in the care plan section) Acute Rehab OT Goals Patient Stated Goal: to go home OT  Goal Formulation:  (eval only)  OT Frequency:     Barriers to D/C:            Co-evaluation              End of Session Nurse Communication: Mobility status  Activity Tolerance: Patient tolerated treatment well Patient left: in chair;with call bell/phone within reach;with family/visitor present   Time: 6629-4765 OT Time Calculation (min): 15 min Charges:  OT General Charges $OT Visit: 1 Procedure OT Evaluation $Initial OT Evaluation Tier I: 1 Procedure OT Treatments $Self Care/Home Management : 8-22 mins G-Codes:    Fallyn Munnerlyn,HILLARY Aug 18, 2014, 11:27 AM   Maurie Boettcher, OTR/L  [phone] 18-Aug-2014

## 2014-07-27 NOTE — Plan of Care (Signed)
Problem: Phase III Progression Outcomes Goal: No anginal pain Outcome: Completed/Met Date Met:  07/27/14 Goal: Cath/PCI Path as indicated Outcome: Not Applicable Date Met:  77/82/42 Goal: Vascular site scale level 0 - I Vascular Site Scale Level 0: No bruising/bleeding/hematoma Level I (Mild): Bruising/Ecchymosis, minimal bleeding/ooozing, palpable hematoma < 3 cm Level II (Moderate): Bleeding not affecting hemodynamic parameters, pseudoaneurysm, palpable hematoma > 3 cm Level III (Severe) Bleeding which affects hemodynamic parameters or retroperitoneal hemorrhage  Outcome: Completed/Met Date Met:  07/27/14 Goal: Tolerating diet Outcome: Completed/Met Date Met:  07/27/14

## 2014-07-28 LAB — COMPREHENSIVE METABOLIC PANEL
ALT: 58 U/L — AB (ref 0–35)
AST: 34 U/L (ref 0–37)
Albumin: 2.8 g/dL — ABNORMAL LOW (ref 3.5–5.2)
Alkaline Phosphatase: 69 U/L (ref 39–117)
Anion gap: 17 — ABNORMAL HIGH (ref 5–15)
BUN: 18 mg/dL (ref 6–23)
CALCIUM: 8.7 mg/dL (ref 8.4–10.5)
CO2: 23 mEq/L (ref 19–32)
CREATININE: 0.6 mg/dL (ref 0.50–1.10)
Chloride: 99 mEq/L (ref 96–112)
GFR calc Af Amer: >90 mL/min (ref >90)
GFR, EST NON AFRICAN AMERICAN: 81 mL/min — AB (ref >90)
Glucose, Bld: 187 mg/dL — ABNORMAL HIGH (ref 70–99)
Potassium: 4.3 mEq/L (ref 3.7–5.3)
SODIUM: 139 meq/L (ref 137–147)
TOTAL PROTEIN: 6.9 g/dL (ref 6.0–8.3)
Total Bilirubin: 0.6 mg/dL (ref 0.3–1.2)

## 2014-07-28 LAB — GLUCOSE, CAPILLARY
GLUCOSE-CAPILLARY: 181 mg/dL — AB (ref 70–99)
GLUCOSE-CAPILLARY: 204 mg/dL — AB (ref 70–99)
GLUCOSE-CAPILLARY: 98 mg/dL (ref 70–99)
GLUCOSE-CAPILLARY: 136 mg/dL — AB (ref 70–99)

## 2014-07-28 LAB — PROTIME-INR
INR: 2.53 — AB (ref 0.00–1.49)
Prothrombin Time: 27.4 seconds — ABNORMAL HIGH (ref 11.6–15.2)

## 2014-07-28 LAB — HEPARIN LEVEL (UNFRACTIONATED): Heparin Unfractionated: 0.47 IU/mL (ref 0.30–0.70)

## 2014-07-28 MED ORDER — WARFARIN 0.5 MG HALF TABLET
0.5000 mg | ORAL_TABLET | Freq: Once | ORAL | Status: AC
  Administered 2014-07-28: 0.5 mg via ORAL
  Filled 2014-07-28: qty 1

## 2014-07-28 NOTE — Plan of Care (Signed)
Problem: Phase II Progression Outcomes Goal: Vascular site scale level 0 - I Vascular Site Scale Level 0: No bruising/bleeding/hematoma Level I (Mild): Bruising/Ecchymosis, minimal bleeding/ooozing, palpable hematoma < 3 cm Level II (Moderate): Bleeding not affecting hemodynamic parameters, pseudoaneurysm, palpable hematoma > 3 cm Level III (Severe) Bleeding which affects hemodynamic parameters or retroperitoneal hemorrhage  Outcome: Completed/Met Date Met:  07/28/14 Goal: Tolerating diet Outcome: Completed/Met Date Met:  07/28/14

## 2014-07-28 NOTE — Plan of Care (Signed)
Problem: Phase II Progression Outcomes Goal: Stress Test if indicated Outcome: Not Applicable Date Met:  86/82/57 Goal: Cath/PCI Day Path if indicated Outcome: Completed/Met Date Met:  07/28/14

## 2014-07-28 NOTE — Plan of Care (Signed)
Problem: Phase II Progression Outcomes Goal: Cardiac Rehab if ordered Outcome: Completed/Met Date Met:  07/28/14

## 2014-07-28 NOTE — Progress Notes (Signed)
Subjective:  still in a flutter  Objective: Vital signs in last 24 hours: Temp:  [97.7 F (36.5 C)-97.9 F (36.6 C)] 97.8 F (36.6 C) (11/10 0605) Pulse Rate:  [71-89] 89 (11/10 0605) Resp:  [18] 18 (11/10 0605) BP: (116-130)/(55-78) 130/68 mmHg (11/10 0605) SpO2:  [95 %-100 %] 100 % (11/10 0605) Weight:  [120 lb 5.9 oz (54.6 kg)] 120 lb 5.9 oz (54.6 kg) (11/10 1610) Weight change: 7.1 oz (0.2 kg) Last BM Date: 07/28/14 Intake/Output from previous day: not accurate wt up 1 lb 11/09 0701 - 11/10 0700 In: 480 [P.O.:480] Out: -  Intake/Output this shift:    PE: General:Pleasant affect, NAD Skin:Warm and dry, brisk capillary refill HEENT:normocephalic, sclera clear, mucus membranes moist Heart:S1S2 RRR without murmur, gallup, rub or click Lungs:clear without rales, rhonchi, or wheezes RUE:AVWU, non tender, + BS, do not palpate liver spleen or masses Ext:tr to 1+ lower ext edema though improved, hands much improved 2+ pedal pulses, 2+ radial pulses Neuro:alert and oriented X 3, MAE, follows commands, + facial symmetry   Lab Results:  Recent Labs  07/26/14 0527 07/27/14 0428  WBC 12.8* 10.3  HGB 12.2 10.3*  HCT 37.2 31.4*  PLT 325 295   BMET  Recent Labs  07/28/14 0513  NA 139  K 4.3  CL 99  CO2 23  GLUCOSE 187*  BUN 18  CREATININE 0.60  CALCIUM 8.7   No results for input(s): TROPONINI in the last 72 hours.  Invalid input(s): CK, MB  Lab Results  Component Value Date   CHOL 84 07/19/2014   HDL 24* 07/19/2014   LDLCALC 41 07/19/2014   LDLDIRECT 95.1 08/18/2013   TRIG 94 07/19/2014   CHOLHDL 3.5 07/19/2014   Lab Results  Component Value Date   HGBA1C 7.1* 07/18/2014     Lab Results  Component Value Date   TSH 2.080 07/18/2014    Hepatic Function Panel  Recent Labs  07/28/14 0513  PROT 6.9  ALBUMIN 2.8*  AST 34  ALT 58*  ALKPHOS 69  BILITOT 0.6   No results for input(s): CHOL in the last 72 hours. No results for  input(s): PROTIME in the last 72 hours.     Studies/Results: No results found.  Medications: I have reviewed the patient's current medications. Scheduled Meds: . amiodarone  200 mg Oral BID  . antiseptic oral rinse  7 mL Mouth Rinse BID  . aspirin EC  81 mg Oral Daily  . clopidogrel  75 mg Oral Q breakfast  . diltiazem  360 mg Oral Daily  . docusate sodium  100 mg Oral BID  . furosemide  40 mg Intravenous Daily  . glipiZIDE  10 mg Oral Q breakfast  . insulin aspart  0-24 Units Subcutaneous TID WC  . linagliptin  5 mg Oral Daily  . metoprolol tartrate  25 mg Oral BID  . pantoprazole  40 mg Oral Daily  . warfarin   Does not apply Once  . Warfarin - Pharmacist Dosing Inpatient   Does not apply q1800   Continuous Infusions: . heparin 950 Units/hr (07/26/14 1839)   PRN Meds:.acetaminophen, alum & mag hydroxide-simeth, fentaNYL, nitroGLYCERIN, ondansetron (ZOFRAN) IV, oxyCODONE-acetaminophen, prochlorperazine  Assessment/Plan: NSTEMI>>> cath results show diffusely diseased right and left coronary arteris not easily stentable. Recommend dual antiplatlet therapy for at least 1 month, if angina returns then consider PCI to RCA.Marland Kitchen No chest pain today.  Atrial Flutter/ A fib >>> currently in a  flutter HR 50 to 103 - on long acting cardizem 360 daily  - Metoprolol 25mg  BID - Warfarin per pharmacy INR 1.68 today - stop Heparin - amiodarone added, still in flutter but rate controlled  Hypokalemia> repleted and resolved   Transaminitis >> holding statin, LFTs improving. Possibly secondary to ischemia versus drug induced (?doxycyline> now discontinued). AST down to normal limits with ALT still mildly elevated. Patient will need follow up with [facility] after discharge will check in am with CMP-   HTN >> BP 130/68 this AM. On PO cardiazem and increasing metoprolol to 25 BID.   T2DM >> Mildly hyperglycemic., minimal need for SSI  resumed home meds of glipizide and januvia-  improved glucose now 131 this am  Sinusitus>>> augmentin> Last dose yesterday   Anemia, slow drift down, now 10.3 was 11.5   Can d/c when INR is over 2--currently is 2.53  With addition of amiodarone  LOS: 10 days   Time spent with pt. :15 minutes. [facility] R  Nurse Practitioner Certified Pager [phone] or after 5pm and on weekends call [phone] 07/28/2014, 8:34 AM   I have personally seen and examined this patient with Bianca Kicks, NP. I agree with the assessment and plan as outlined above. Atrial flutter is rate controlled. INR is 2.5 today but with big jump and addition of amiodarone, will monitor her today and repeat INR in am to make sure we don't overshoot. D/C heparin IV. She is stable overall and feeling much better. Probable d/c home tomorrow. She wishes to f/u with Dr. Tamala Julian or Dr. Irish Lack instead of her cardiologist in [place].   Bianca Mueller 07/28/2014 9:00 AM

## 2014-07-28 NOTE — Progress Notes (Signed)
  ANTICOAGULATION CONSULT NOTE - Follow Up Consult  Pharmacy Consult for Heparin and Coumadin Indication: Afib, NSTEMI  Allergies  Allergen Reactions  . Alendronate Sodium   . Isosorbide   . Metformin And Related     Decreases appetite resulting in weight loss  . Metronidazole     REACTION: sick/ GI  . Nifedipine     Patient Measurements: Height: 5\' 2"  (157.5 cm) Weight: 120 lb 5.9 oz (54.6 kg) IBW/kg (Calculated) : 50.1 Heparin Dosing Weight: 52kg  Vital Signs: Temp: 97.8 F (36.6 C) (11/10 0605) Temp Source: Oral (11/10 0605) BP: 128/70 mmHg (11/10 0957) Pulse Rate: 99 (11/10 0957)  Labs:  Recent Labs  07/26/14 0527 07/27/14 0420 07/27/14 0428 07/28/14 0513  HGB 12.2  --  10.3*  --   HCT 37.2  --  31.4*  --   PLT 325  --  295  --   LABPROT 15.8*  --  19.9* 27.4*  INR 1.25  --  1.68* 2.53*  HEPARINUNFRC 0.67 0.62  --  0.47  CREATININE  --   --   --  0.60    Estimated Creatinine Clearance: 40.7 mL/min (by C-G formula based on Cr of 0.6).  Assessment: 87yof s/p cath for NSTEMI on heparin bridging to Coumadin for new onset Afib/flutter. Heparin level (0.62) remains therapeutic at upper end of goal range - INR=2.53 (trending up quickly with 7.5 to 8 mg doses and addition of amiodarone) - H/H and Plts stable - No significant bleeding reported  Goal of Therapy:  INR 2-3 Heparin level 0.3-0.7 units/ml Monitor platelets by anticoagulation protocol: Yes   Plan:  1. Stop heparin 2. Coumadin 0.5 mg po x 1 dose today 3. Daily INR  Tad Moore  [phone] 07/28/2014,11:34 AM

## 2014-07-28 NOTE — Progress Notes (Signed)
CARDIAC REHAB PHASE I   PRE:  Rate/Rhythm: 83 aflutter    BP: sitting 126/70    SaO2:   MODE:  Ambulation: 350 ft   POST:  Rate/Rhythm: 108 aflutter    BP: sitting 120/84     SaO2:   Pt c/o lower back pain today with standing. Needed RW to support this. Felt better walking with it, less pain. HR much more controlled today. Tired after walk, return to recliner. Doing well.  [phone]   Josephina Shih [place]on CES, ACSM 07/28/2014 10:59 AM

## 2014-07-28 NOTE — Discharge Instructions (Signed)

## 2014-07-29 LAB — BASIC METABOLIC PANEL
Anion gap: 14 (ref 5–15)
BUN: 15 mg/dL (ref 6–23)
CO2: 24 meq/L (ref 19–32)
Calcium: 9 mg/dL (ref 8.4–10.5)
Chloride: 100 mEq/L (ref 96–112)
Creatinine, Ser: 0.69 mg/dL (ref 0.50–1.10)
GFR calc Af Amer: 89 mL/min — ABNORMAL LOW (ref >90)
GFR calc non Af Amer: 77 mL/min — ABNORMAL LOW (ref >90)
Glucose, Bld: 97 mg/dL (ref 70–99)
POTASSIUM: 3.5 meq/L — AB (ref 3.7–5.3)
Sodium: 138 mEq/L (ref 137–147)

## 2014-07-29 LAB — GLUCOSE, CAPILLARY
Glucose-Capillary: 102 mg/dL — ABNORMAL HIGH (ref 70–99)
Glucose-Capillary: 188 mg/dL — ABNORMAL HIGH (ref 70–99)

## 2014-07-29 LAB — PROTIME-INR
INR: 2.58 — ABNORMAL HIGH (ref 0.00–1.49)
Prothrombin Time: 27.9 seconds — ABNORMAL HIGH (ref 11.6–15.2)

## 2014-07-29 MED ORDER — WARFARIN SODIUM 3 MG PO TABS: 3.0000 mg | ORAL_TABLET | Freq: Every day | ORAL | Status: DC

## 2014-07-29 MED ORDER — DILTIAZEM HCL ER COATED BEADS 360 MG PO CP24: 360.0000 mg | ORAL_CAPSULE | Freq: Every day | ORAL | Status: DC

## 2014-07-29 MED ORDER — CLOPIDOGREL BISULFATE 75 MG PO TABS: 75.0000 mg | ORAL_TABLET | Freq: Every day | ORAL | Status: DC

## 2014-07-29 MED ORDER — METOPROLOL TARTRATE 25 MG PO TABS: 25.0000 mg | ORAL_TABLET | Freq: Two times a day (BID) | ORAL | Status: DC

## 2014-07-29 MED ORDER — POTASSIUM CHLORIDE CRYS ER 20 MEQ PO TBCR
40.0000 meq | EXTENDED_RELEASE_TABLET | Freq: Once | ORAL | Status: AC
  Administered 2014-07-29: 40 meq via ORAL
  Filled 2014-07-29: qty 2

## 2014-07-29 MED ORDER — DSS 100 MG PO CAPS: 100.0000 mg | ORAL_CAPSULE | Freq: Two times a day (BID) | ORAL | Status: AC

## 2014-07-29 MED ORDER — WARFARIN SODIUM 4 MG PO TABS
4.0000 mg | ORAL_TABLET | Freq: Every day | ORAL | Status: DC
  Filled 2014-07-29: qty 1

## 2014-07-29 MED ORDER — AMIODARONE HCL 200 MG PO TABS: 200.0000 mg | ORAL_TABLET | Freq: Two times a day (BID) | ORAL | Status: DC

## 2014-07-29 NOTE — Progress Notes (Signed)
  ANTICOAGULATION CONSULT NOTE - Follow Up Consult  Pharmacy Consult for Coumadin Indication: Afib, NSTEMI  Allergies  Allergen Reactions  . Alendronate Sodium   . Isosorbide   . Metformin And Related     Decreases appetite resulting in weight loss  . Metronidazole     REACTION: sick/ GI  . Nifedipine     Labs:  Recent Labs  07/27/14 0420 07/27/14 0428 07/28/14 0513 07/29/14 0414  HGB  --  10.3*  --   --   HCT  --  31.4*  --   --   PLT  --  295  --   --   LABPROT  --  19.9* 27.4* 27.9*  INR  --  1.68* 2.53* 2.58*  HEPARINUNFRC 0.62  --  0.47  --   CREATININE  --   --  0.60 0.69    Estimated Creatinine Clearance: 40.7 mL/min (by C-G formula based on Cr of 0.69).  Assessment: 87yof s/p cath for NSTEMI on heparin bridging to Coumadin for new onset Afib/flutter. Heparin level (0.62) remains therapeutic at upper end of goal range - INR=2.58 (therapeutic) - H/H and Plts stable - No significant bleeding reported  Goal of Therapy:  INR 2-3 Heparin level 0.3-0.7 units/ml Monitor platelets by anticoagulation protocol: Yes   Plan:  Coumadin 4 mg po daily at 1800 pm Follow up AM INR if not discharged Outpatient INR Friday or Monday  Tad Moore  [phone] 07/29/2014,10:37 AM

## 2014-07-29 NOTE — Progress Notes (Signed)
CARDIAC REHAB PHASE I   PRE:  Rate/Rhythm: 112 aflutter  BP:  Supine:   Sitting: 125/63  Standing:    SaO2: 96%RA  MODE:  Ambulation: 350 ft   POST:  Rate/Rhythm: 132 aflutter  BP:  Supine:   Sitting: 136/64  Standing:    SaO2: 100%RA 0910-0930 Pt walked 350 ft with rolling walker and minimal asst. Tolerated well. No complaints. Wants to go home. To recliner after walk.   Graylon Good, RN BSN  07/29/2014 9:28 AM

## 2014-07-29 NOTE — Discharge Summary (Signed)
CARDIOLOGY DISCHARGE SUMMARY   Patient ID: Bianca Mueller MRN: [id number] DOB/AGE: April 11, 1926 80 y.o.  Admit date: 07/18/2014 Discharge date: 07/29/2014  PCP: Loura Pardon, MD Primary Cardiologist: Dr Tamala Julian  Primary Discharge Diagnosis:   NSTEMI (non-ST elevated myocardial infarction) Secondary Discharge Diagnosis:    Essential hypertension   DM2 (diabetes mellitus, type 2)   Dyslipidemia   Transaminitis   Acute sinusitis   Atrial flutter   Nausea without vomiting   Atrial fibrillation   Acute on chronic diastolic CHF (congestive heart failure), NYHA class 2   Hypokalemia  Consults: Pharmacy (for coumadin), GI, Physical Therapy    Procedures: 1. Left heart catheterization; 2. Coronary angiography; 3. Left ventriculography, 4. Abdominal US, 5. CTA chest/aorta with/without contrast, 6. 2 D Echocardiogram  Hospital Course: Bianca Mueller is a 80 y.o. female with a history of T2DM, HTN, HLD, CAD (anterior MI treated w/TPA in 1989 and subsequent angioplasty with EF of 38% at [facility]) who presented on 07/18/2014 for upper back pain.   CTA of the chest was performed, looking for dissection, but was negative. Her cardiac enzymes were elevated, indicating a NSTEMI. She initially went to an outside hospital where she was treated medically with medications that included aspirin, nitrates, and a heparin drip. She was transferred to [facility] for further evaluation and treatment.  She had some problems with hypotension that were treated with fluid bolus. She was on heparin, aspirin, high-dose statin, and a beta blocker (once her blood pressure improved). A 2-D echocardiogram was performed, results are below. It showed a preserved EF with grade 1 diastolic dysfunction. There was no obvious wall motion abnormality.  She developed abnormal LFTs so the statin was discontinued. Her peak AST and ALT were 727/788 respectively. An abdominal ultrasound was performed which did not show any  gallbladder pathology. Her lipase was within normal limits and the viral panel was negative.   She had been on doxycycline prior to admission for sinusitis and there was concern that the doxycycline was contributing to her abnormal LFTs. It was discontinued. She was changed to Augmentin to finish the treatment for her sinusitis. Her leukocytosis and LFTs improved. She will not be on a statin at discharge, consideration can be given to starting one as an outpatient, if LFTs remain normal.  By 11/04, she was considered stable for cardiac catheterization. Cardiac catheterization results are below. An initial trial of medical therapy was recommended consideration given to PCI if her symptoms were not controlled.  She was noted go into atrial fibrillation, RVR on 11/01. She had been started on a Cardizem drip but was transitioned to oral Cardizem. She continued to be in and out of atrial fibrillation. She was initially on 90 mg 4 times a day and transitioned to 360 mg daily. Because of her MI, she was also on a beta blocker.  She was seen by cardiac rehabilitation, then ambulated as tolerated. Her heart rate would increase with ambulation and her beta blocker was increased.  She was on heparin, then was started on Coumadin for anticoagulation. This was managed by pharmacy. She was continued on the heparin until her Coumadin was therapeutic. She will have a follow-up next week for Coumadin as an outpatient. Once her Coumadin was therapeutic and she was stable for discharge, the aspirin was discontinued and she is to be on Plavix and Coumadin, to avoid triple drug therapy.  She continued to have intermittent atrial fibrillation and was started on amiodarone. She is currently  loading amiodarone 200 mg twice a day.  There was concern for deconditioning so she was seen by physical therapy. Intermittent supervision was recommended. The patient lives with family and uses a walker as needed. She continued to  gradually improve. She was followed by cardiac rehabilitation, physical therapy and occupational therapy during her hospital stay. She was able to slowly increase her activity without chest pain or shortness of breath.  She was noted to have some anemia, felt secondary to procedures and blood draws. Her MCV is within normal limits. This can be followed as an outpatient.  On 11/11, she was seen by Dr. Angelena Form and all data were reviewed. It was felt she could continue to increase strength and endurance at home. She was having chest pain with ambulation. Her heart rate was controlled on the current doses of Cardizem and metoprolol. Her INR was therapeutic. No further inpatient workup is indicated and she was considered stable for discharge, to follow up as an outpatient with a TOC appointment next week.  She was initially told to restart her Zocor 20 mg at discharge. However, it was felt this should not be done now, it can be considered as an outpatient. The patient was contacted by phone and medications were reviewed. She was clear on the medications she was to stop.   Labs:   Lab Results  Component Value Date   WBC 10.3 07/27/2014   HGB 10.3* 07/27/2014   HCT 31.4* 07/27/2014   MCV 87.5 07/27/2014   PLT 295 07/27/2014     Recent Labs Lab 07/28/14 0513 07/29/14 0414  NA 139 138  K 4.3 3.5*  CL 99 100  CO2 23 24  BUN 18 15  CREATININE 0.60 0.69  CALCIUM 8.7 9.0  PROT 6.9  --   BILITOT 0.6  --   ALKPHOS 69  --   ALT 58*  --   AST 34  --   GLUCOSE 187* 97   Lipid Panel     Component Value Date/Time   CHOL 84 07/19/2014 0937   TRIG 94 07/19/2014 0937   HDL 24* 07/19/2014 0937   CHOLHDL 3.5 07/19/2014 0937   VLDL 19 07/19/2014 0937   LDLCALC 41 07/19/2014 0937   LDLDIRECT 95.1 08/18/2013 0823    PRO B NATRIURETIC PEPTIDE (BNP)  Date/Time Value Ref Range Status  07/18/2014 11:17 PM 2238.0* 0 - 450 pg/mL Final    Recent Labs  07/29/14 0414  INR 2.58*   Lab Results    Component Value Date   TROPONINI 2.73* 07/19/2014      Radiology: US Abdomen Port 07/20/2014   CLINICAL DATA:  Diffuse gallbladder wall thickening on a recent abdomen CTA.  EXAM: ULTRASOUND PORTABLE ABDOMEN  COMPARISON:  Abdomen CTA dated 07/18/2014.  FINDINGS: Gallbladder: No gallstones or wall thickening visualized. No sonographic Murphy sign noted.  Common bile duct: Diameter: 6.5 mm  Liver: No focal lesion identified. Within normal limits in parenchymal echogenicity.  IVC: No abnormality visualized.  Pancreas: Visualized portion unremarkable.  Spleen: Size and appearance within normal limits.  Right Kidney: Length: 10.4 cm. Echogenicity within normal limits. No mass or hydronephrosis visualized.  Left Kidney: Length: 10.1 cm. Echogenicity within normal limits. No mass or hydronephrosis visualized.  Abdominal aorta: No aneurysm visualized.  Other findings: Small bilateral pleural effusions.  IMPRESSION: 1. The gallbladder has a normal appearance today with no gallstones or wall thickening. 2. Small bilateral pleural effusions.   Electronically Signed   By: Georg Ruddle.D.  On: 07/20/2014 17:05   Dg Chest Port 1 View 07/18/2014   CLINICAL DATA:  Nausea vomiting and decreased oxygen saturation. Former smoker.  EXAM: PORTABLE CHEST - 1 VIEW  COMPARISON:  04/04/2014.  FINDINGS: Mild cardiac enlargement. Atherosclerotic disease is noted within the aortic arch. There are 2 nodular densities in the left base that are indeterminate. No pleural effusion identified. Right lung is clear.  IMPRESSION: 1. Two nodular opacities are identified in the left base. In this patient who has a history of smoking a initial further workup with a dedicated PA and lateral upright radiographs are recommended.   Electronically Signed   By: Kerby Moors M.D.   On: 07/18/2014 19:25   Ct Angio Chest Aorta W/cm &/or Wo/cm 07/18/2014   CLINICAL DATA:  Nausea and vomiting.  Intermittent upper back pain.  EXAM: CT ANGIOGRAPHY  CHEST, ABDOMEN AND PELVIS  TECHNIQUE: Multidetector CT imaging through the chest, abdomen and pelvis was performed using the standard protocol during bolus administration of intravenous contrast. Multiplanar reconstructed images and MIPs were obtained and reviewed to evaluate the vascular anatomy.  CONTRAST:  170mL OMNIPAQUE IOHEXOL 350 MG/ML SOLN  COMPARISON:  None.  FINDINGS: CTA CHEST FINDINGS  Mediastinum: The heart size is normal. There is a small to moderate pericardial effusion identified. There is calcified atherosclerotic disease involving the thoracic aorta. No evidence for 8 dissection. The main pulmonary artery appears patent there is no abnormal filling defects within the main pulmonary artery or its branches.  Lungs/Pleura: There is atelectasis/ scarring identified within the lung bases. A calcified granuloma is noted in the left base. No airspace consolidation.  Musculoskeletal: There is mild spondylosis identified within the thoracic spine.  Review of the MIP images confirms the above findings.  CTA ABDOMEN AND PELVIS FINDINGS  Hepatobiliary: Normal appearance of the liver. There is diffuse gallbladder wall thickening measuring up to 8 mm. There is no biliary dilatation.  Pancreas: Normal appearance of the pancreas.  Spleen: The spleen is unremarkable.  Adrenals/Urinary Tract: The adrenal glands are both normal. There is mild bilateral renal cortical thinning. The urinary bladder is within normal limits.  Stomach/Bowel: Small hiatal hernia. The stomach is otherwise unremarkable. The small bowel loops have a normal course and caliber without obstruction. The appendix is visualized and appears normal. Multiple distal colonic diverticula identified without acute inflammation.  Vascular/Lymphatic: There is calcified atherosclerotic disease involving the normal caliber abdominal aorta. There is no dissection. No retroperitoneal adenopathy.  Reproductive: Previous hysterectomy. The adnexal structures  appear normal.  Other: There is no free fluid or fluid collections within the abdomen or pelvis.  Musculoskeletal: Review of the visualized osseous structures is significant for degenerative disc disease within the L4-5 level.  Review of the MIP images confirms the above findings.  IMPRESSION: 1. No evidence for aortic dissection. 2. Atherosclerotic disease. 3. Gallbladder wall thickening. If there are clinical signs or symptoms of cholecystitis consider further investigation with abdominal sonogram. 4. Hiatal hernia 5. Thoracic and lumbar degenerative disc disease.   Electronically Signed   By: Kerby Moors M.D.   On: 07/18/2014 20:35   Ct Cta Abd/pel W/cm &/or W/o Cm 07/18/2014   CLINICAL DATA:  Nausea and vomiting.  Intermittent upper back pain.  EXAM: CT ANGIOGRAPHY CHEST, ABDOMEN AND PELVIS  TECHNIQUE: Multidetector CT imaging through the chest, abdomen and pelvis was performed using the standard protocol during bolus administration of intravenous contrast. Multiplanar reconstructed images and MIPs were obtained and reviewed to evaluate the vascular anatomy.  CONTRAST:  153mL OMNIPAQUE IOHEXOL 350 MG/ML SOLN  COMPARISON:  None.  FINDINGS: CTA CHEST FINDINGS  Mediastinum: The heart size is normal. There is a small to moderate pericardial effusion identified. There is calcified atherosclerotic disease involving the thoracic aorta. No evidence for 8 dissection. The main pulmonary artery appears patent there is no abnormal filling defects within the main pulmonary artery or its branches.  Lungs/Pleura: There is atelectasis/ scarring identified within the lung bases. A calcified granuloma is noted in the left base. No airspace consolidation.  Musculoskeletal: There is mild spondylosis identified within the thoracic spine.  Review of the MIP images confirms the above findings.  CTA ABDOMEN AND PELVIS FINDINGS  Hepatobiliary: Normal appearance of the liver. There is diffuse gallbladder wall thickening measuring  up to 8 mm. There is no biliary dilatation.  Pancreas: Normal appearance of the pancreas.  Spleen: The spleen is unremarkable.  Adrenals/Urinary Tract: The adrenal glands are both normal. There is mild bilateral renal cortical thinning. The urinary bladder is within normal limits.  Stomach/Bowel: Small hiatal hernia. The stomach is otherwise unremarkable. The small bowel loops have a normal course and caliber without obstruction. The appendix is visualized and appears normal. Multiple distal colonic diverticula identified without acute inflammation.  Vascular/Lymphatic: There is calcified atherosclerotic disease involving the normal caliber abdominal aorta. There is no dissection. No retroperitoneal adenopathy.  Reproductive: Previous hysterectomy. The adnexal structures appear normal.  Other: There is no free fluid or fluid collections within the abdomen or pelvis.  Musculoskeletal: Review of the visualized osseous structures is significant for degenerative disc disease within the L4-5 level.  Review of the MIP images confirms the above findings.  IMPRESSION: 1. No evidence for aortic dissection. 2. Atherosclerotic disease. 3. Gallbladder wall thickening. If there are clinical signs or symptoms of cholecystitis consider further investigation with abdominal sonogram. 4. Hiatal hernia 5. Thoracic and lumbar degenerative disc disease.   Electronically Signed   By: Kerby Moors M.D.   On: 07/18/2014 20:35    Cardiac Cath: 07/22/2014 ANGIOGRAPHIC DATA: The left main coronary artery is widely patent. The left anterior descending artery is heavily calcified, tortuous, and with irregularities but no obstruction greater than 30-40%.the second diagonal contains 90% stenosis rock small to bifurcation and the more medial branch also contains 95% stenosis. The left circumflex artery is severely and diffusely diseased. The mid circumflex is totally occluded contrast hang up suggesting possible recent total occlusion.  His total occlusion occurs after the origin of the first obtuse marginal which contains segmental disease with up to 90% obstruction that vascular distribution is small to moderate. The right coronary artery is heavily calcified from proximal to distal vessel. There is moderate to severe diffuse disease starting at the first acute marginal branch. A relatively focal eccentric region of 70-80% stenosis is noted embedded within a 40 cm segment of 50-60% stenosis. The distal RCA contains 90% stenosis within it appears to be a ruptured plaque. The PDA and left ventricular branches are patent. The vascular distribution is large. LEFT VENTRICULOGRAM: Left ventricular angiogram was done in the 30 RAO projection and revealed a normal sized cavity with an estimated ejection fraction of 55-60%. No obvious regional wall motion abnormality is noted other than deformation of the anterior wall mid segment. Despite that deformity there appears to be normal or near-normal contractility. IMPRESSIONS: 1. Heavily calcified right and left coronary arteries 2. Severely and diffusely diseased mid to distal RCA. Large territory subtended by this vessel. Possible acute mid vessel lesion.  3. Total occlusion of the mid circumflex beyond the origin of the first obtuse marginal. The first marginal is severely and diffusely diseased. 4. Widely patent dominant LAD with severe disease in the second diagonal. 5. Overall normal LV function RECOMMENDATION: My initial recommendation will be medical therapy which should include dual antiplatelet therapy for at least a month. If angina recurs, territories that are treatable may include the mid and distal right coronary (the distal lesion will be difficult to treat because stent delivery will be impeded by the severe diffuse calcification noted throughout the entire mid segment). The circumflex marginal can also be treated. However the vessel is very small in caliber and diffusely diseased,  and may require rotational atherectomy. In her current situation, it is difficult to tell exactly which vessel caused the enzyme rise. With wide patency of the LAD, I would choose conservative medical management rather than attempting revascularization of territories that would be difficult to treat and possibly have suboptimal outcome. Should symptoms continue to recur however complex angioplasty could be attempted.  EKG: Sinus rhythm, no acute ischemic changes  Echo: 07/19/2014 Conclusions - Left ventricle: The cavity size was normal. Wall thickness was normal. Systolic function was normal. The estimated ejection fraction was in the range of 60% to 65%. Although no diagnostic regional wall motion abnormality was identified, this possibility cannot be completely excluded on the basis of this study. Doppler parameters are consistent with abnormal left ventricular relaxation (grade 1 diastolic dysfunction). - Tricuspid valve: There was mild-moderate regurgitation. - Pulmonary arteries: Systolic pressure was mildly increased. PA peak pressure: 37 mm Hg (S). - Pericardium, extracardiac: A trivial pericardial effusion was identified.  FOLLOW UP PLANS AND APPOINTMENTS Allergies  Allergen Reactions  . Alendronate Sodium   . Isosorbide   . Metformin And Related     Decreases appetite resulting in weight loss  . Metronidazole     REACTION: sick/ GI  . Nifedipine      Medication List    STOP taking these medications        amLODipine 5 MG tablet  Commonly known as:  NORVASC     aspirin EC 325 MG tablet     cloNIDine 0.2 MG tablet  Commonly known as:  CATAPRES     doxycycline 100 MG tablet  Commonly known as:  VIBRA-TABS     labetalol 100 MG tablet  Commonly known as:  NORMODYNE     simvastatin 20 MG tablet  Commonly known as:  ZOCOR      TAKE these medications        amiodarone 200 MG tablet  Commonly known as:  PACERONE  Take 1 tablet (200 mg total)  by mouth 2 (two) times daily.     clopidogrel 75 MG tablet  Commonly known as:  PLAVIX  Take 1 tablet (75 mg total) by mouth daily with breakfast.     diltiazem 360 MG 24 hr capsule  Commonly known as:  CARDIZEM CD  Take 1 capsule (360 mg total) by mouth daily.     DSS 100 MG Caps  Take 100 mg by mouth 2 (two) times daily.     esomeprazole 40 MG capsule  Commonly known as:  NEXIUM  Take 40 mg by mouth daily at 12 noon.     glipiZIDE 10 MG 24 hr tablet  Commonly known as:  GLUCOTROL XL  Take 10 mg by mouth daily with breakfast.     glucose blood test strip  Commonly known as:  ONE TOUCH ULTRA TEST  USE ONE STRIP TO CHECK BLOOD SUGAR ONCE DAILY AND PRN FOR DM 250.0     lisinopril-hydrochlorothiazide 20-12.5 MG per tablet  Commonly known as:  ZESTORETIC  Take 1 tablet by mouth daily.     metoprolol tartrate 25 MG tablet  Commonly known as:  LOPRESSOR  Take 1 tablet (25 mg total) by mouth 2 (two) times daily.     nitroGLYCERIN 0.4 MG SL tablet  Commonly known as:  NITROSTAT  Place 1 tablet (0.4 mg total) under the tongue every 5 (five) minutes as needed. For chest pain and if not improved seek medical attention in ER.     ONETOUCH DELICA LANCETS 32Z Misc  Use as instructed to check blood sugar once daily or as needed.  250.00     sitaGLIPtin 100 MG tablet  Commonly known as:  JANUVIA  Take 100 mg by mouth daily.     warfarin 3 MG tablet  Commonly known as:  COUMADIN  Take 1 tablet (3 mg total) by mouth daily at 6 PM. Or as directed        Discharge Instructions    Diet - low sodium heart healthy    Complete by:  As directed      Diet Carb Modified    Complete by:  As directed      Increase activity slowly    Complete by:  As directed           Follow-up Information    Follow up with Murray Hodgkins, NP On 08/05/2014.   Specialty:  Nurse Practitioner   Why:  See for Dr. Tamala Julian at 1:15 pm   Contact information:   [place]  [place] [address] [phone]       Follow up with Rande Lawman at [facility] On 08/05/2014.   Specialty:  Cardiology   Why:  Coumadin check at 12:20 pm   Contact information:   [address] [place]  Time spent with patient to include physician time: 43 min  Signed: Rosaria Ferries, PA-C 07/29/2014, 3:26 PM Co-Sign MD

## 2014-07-29 NOTE — Progress Notes (Signed)
Patient Name: Bianca Mueller Date of Encounter: 07/29/2014  Principal Problem:   NSTEMI (non-ST elevated myocardial infarction) Active Problems:   Essential hypertension   DM2 (diabetes mellitus, type 2)   Dyslipidemia   Transaminitis   Acute sinusitis   Atrial flutter   Nausea without vomiting   Atrial fibrillation   Acute on chronic diastolic CHF (congestive heart failure), NYHA class 2   Primary Cardiologist:  Patient Profile: Bianca Mueller is a 80 yo woman with PMH of T2DM, HTN, HLD, CAD (anterior MI treated w/TPA in 1989 and subsequent angioplasty with EF of 38%) who presented on 07/18/2014 for upper back pain.  SUBJECTIVE: Denies any chest pain or SOB. Says her legs are still slightly swollen but have decreased in size significantly over the past week.  OBJECTIVE Filed Vitals:   07/28/14 0957 07/28/14 1444 07/28/14 2036 07/29/14 0525  BP: 128/70 114/59 129/61 127/59  Pulse: 99 95 95 92  Temp:  98.1 F (36.7 C) 98.3 F (36.8 C) 98.7 F (37.1 C)  TempSrc:  Oral Oral Oral  Resp:  18 18 18   Height:      Weight:    118 lb (53.524 kg)  SpO2:  95% 95% 98%    Intake/Output Summary (Last 24 hours) at 07/29/14 1144 Last data filed at 07/29/14 0525  Gross per 24 hour  Intake    600 ml  Output      0 ml  Net    600 ml   Filed Weights   07/27/14 0439 07/28/14 0605 07/29/14 0525  Weight: 119 lb 14.9 oz (54.4 kg) 120 lb 5.9 oz (54.6 kg) 118 lb (53.524 kg)    PHYSICAL EXAM General: Well developed, well nourished, female in no acute distress. Head: Normocephalic, atraumatic.  Neck: Supple without bruits, JVD not elevated. Lungs:  Resp regular and unlabored, CTA without wheezing or rales. Heart: Irregularly irregular, no S3, S4, or murmur; no rub. Abdomen: Soft, non-tender, non-distended, BS + x 4.  Extremities: No clubbing, no cyanosis, 1+ edema up to mid-shins bilaterally.  Neuro: Alert and oriented X 3. Moves all extremities spontaneously. Psych: Normal  affect.  LABS: CBC: Recent Labs  07/27/14 0428  WBC 10.3  HGB 10.3*  HCT 31.4*  MCV 87.5  PLT 295   INR: Recent Labs  07/29/14 0414  INR 6.26*   Basic Metabolic Panel: Recent Labs  07/28/14 0513 07/29/14 0414  NA 139 138  K 4.3 3.5*  CL 99 100  CO2 23 24  GLUCOSE 187* 97  BUN 18 15  CREATININE 0.60 0.69  CALCIUM 8.7 9.0   Liver Function Tests: Recent Labs  07/28/14 0513  AST 34  ALT 58*  ALKPHOS 69  BILITOT 0.6  PROT 6.9  ALBUMIN 2.8*   BNP: PRO B NATRIURETIC PEPTIDE (BNP)  Date/Time Value Ref Range Status  07/18/2014 11:17 PM 2238.0* 0 - 450 pg/mL Final    TELE:  Atrial Flutter with no ventricular arrhythmias.      Current Medications:  . amiodarone  200 mg Oral BID  . antiseptic oral rinse  7 mL Mouth Rinse BID  . aspirin EC  81 mg Oral Daily  . clopidogrel  75 mg Oral Q breakfast  . diltiazem  360 mg Oral Daily  . docusate sodium  100 mg Oral BID  . furosemide  40 mg Intravenous Daily  . glipiZIDE  10 mg Oral Q breakfast  . insulin aspart  0-24 Units Subcutaneous TID WC  .  linagliptin  5 mg Oral Daily  . metoprolol tartrate  25 mg Oral BID  . pantoprazole  40 mg Oral Daily  . warfarin  4 mg Oral q1800  . warfarin   Does not apply Once  . Warfarin - Pharmacist Dosing Inpatient   Does not apply q1800     ASSESSMENT AND PLAN: 1. NSTEMI  - LHC on 07/22/2014 showed 1. heavily calcified right and left coronary arteries 2. Severely and diffusely diseased mid to distal RCA. Large territory subtended by this vessel. Possible acute mid vessel lesion 3. Total occlusion of the mid circumflex beyond the origin of the first obtuse marginal. The first marginal is severely and diffusely diseased. 4. Widely patent dominant LAD with severe disease in the second diagonal. 5. Overall normal LV function - Recommended dual antiplatlet therapy for at least 1 month, if angina returns then consider PCI to RCA. - Will stop ASA since she is also on Plavix and  Coumadin. (avoid triple therapy). - Continue Plavix 75mg  daily. - Follow-up with Dr. Tamala Julian in 2-3 weeks or office PA/NP.  2. Atrial Flutter/ A fib - On long acting Cardizem 360 daily  - Metoprolol 25mg  BID - Warfarin per pharmacy. OYD7.58 on 07/29/2014 - Continue Amiodarone 200mg  BID.  3. Hypokalemia - Potassium 3.5 on 07/29/2014.  - Will D/C Lasix today. Potassium replaced today  4. Transaminitis  - Continue to hold statins  - AST WNL but ALT still mildly elevated.   5. HTN  - BP has been 114/59 - 129/61  - On PO Cardizem and Metoprolol 25mg  BID.   6. T2DM - On home medications of Glipizide and Tradjenta - SSI  7. Sinusitus - Abx treatment completed.  8. Anemia - Hgb 10.3 on 07/27/2014.  Signed,  Patient seen and examined with PA-S Dineen Kid. Changes made where appropriate.  I have personally seen and examined this patient with Dineen Kid, PA-Student. I agree with the assessment and plan as outlined above. She will need to continue Plavix with recent ACS. No ASA since coumadin is therapeutic. Avoiding triple therapy with ASA, Plavix and coumadin in this elderly female. Volume status is ok. Renal function is stable. Will stop Lasix and resume Lisinopril/HCT and amlodipine at home dose. Will not restart clonidine. Lebetalol has been changed to metoprolol. HR is controlled. Still in atrial flutter. Continue amiodarone, metoprolol and Cardizem.  Discharge home today. She wishes to follow up with Dr. Tamala Julian in our [facility] office. She will need to establish with our coumadin clinic next week for INR check. Follow up with Dr. Tamala Julian or office NP/PA in next 2-3 weeks.    Bianca Mueller 07/29/2014 12:21 PM

## 2014-07-31 ENCOUNTER — Telehealth: Payer: Self-pay | Admitting: Interventional Cardiology

## 2014-07-31 NOTE — Telephone Encounter (Addendum)
The pt states that she is not urinating as much as she was while taking Captopril/HCTZ 25/15 mg daily, she was switched to Lisinopril/HCTZ 20/12.5 mg daily at her hosp d/c on 11/11.  The pt reports no weight gain, only mild edema in her feet and no SOB at this time.   The pt is advised that she should weigh herself every morning after urinating, write down the date and her weights and to call us if she gains more than 3 lbs. in a 24 hour period, becomes SOB and or has edema. She verbalized understanding.  The pt needs to have an [place] follow up as she has no cardiologist. Her hosp D/c from 11/11 states that she has a f/u scheduled with C. Berge on 11/18 but the only appt I see listed is with the Coumadin Clnic in the [place] office on 11/18. I discussed with and gave her d/c summary to our operator dept to take care of scheduling her a [facility] f/u.

## 2014-07-31 NOTE — Telephone Encounter (Signed)
Follow up   Making she the message is routed correctly

## 2014-07-31 NOTE — Telephone Encounter (Signed)
New message  Pt daughter called reports the pt is not on the same dierectic and the pt feels as if she is not breathing well.. Tight in the chest and not urinating as she was in the hopital. Please call back to discuss//sr    Pt was seen by Dr. Tamala Julian

## 2014-08-03 ENCOUNTER — Telehealth: Payer: Self-pay | Admitting: *Deleted

## 2014-08-03 NOTE — Telephone Encounter (Signed)
Great- let me know if she needs a home PT order for weakness if she is not already getting it  Glad she has the cardiology follow up soon

## 2014-08-03 NOTE — Telephone Encounter (Signed)
Dr. Glori Bickers sent me a message saying "Please check in with pt to see how she is feeling - her d/c summary recommended f/u with cardiology (did not recommend f/u with primary care) - so I just wanted to see how she is feeling Thanks"  Spoke with pt's daughter [name] and she said she is doing "so so", she said pt is stable but just really weak, but she is f/u with Cardiology tomorrow

## 2014-08-04 ENCOUNTER — Ambulatory Visit (INDEPENDENT_AMBULATORY_CARE_PROVIDER_SITE_OTHER): Payer: Commercial Managed Care - HMO | Admitting: Nurse Practitioner

## 2014-08-04 ENCOUNTER — Encounter: Payer: Self-pay | Admitting: Nurse Practitioner

## 2014-08-04 ENCOUNTER — Encounter: Payer: Self-pay | Admitting: *Deleted

## 2014-08-04 ENCOUNTER — Telehealth: Payer: Self-pay | Admitting: *Deleted

## 2014-08-04 VITALS — BP 110/60 | HR 86 | Ht 62.0 in | Wt 118.0 lb

## 2014-08-04 DIAGNOSIS — I10 Essential (primary) hypertension: Secondary | ICD-10-CM | POA: Insufficient documentation

## 2014-08-04 DIAGNOSIS — I222 Subsequent non-ST elevation (NSTEMI) myocardial infarction: Secondary | ICD-10-CM

## 2014-08-04 DIAGNOSIS — I4891 Unspecified atrial fibrillation: Secondary | ICD-10-CM

## 2014-08-04 DIAGNOSIS — I5032 Chronic diastolic (congestive) heart failure: Secondary | ICD-10-CM

## 2014-08-04 DIAGNOSIS — E785 Hyperlipidemia, unspecified: Secondary | ICD-10-CM

## 2014-08-04 DIAGNOSIS — I4892 Unspecified atrial flutter: Secondary | ICD-10-CM

## 2014-08-04 DIAGNOSIS — I2510 Atherosclerotic heart disease of native coronary artery without angina pectoris: Secondary | ICD-10-CM

## 2014-08-04 DIAGNOSIS — I214 Non-ST elevation (NSTEMI) myocardial infarction: Secondary | ICD-10-CM

## 2014-08-04 MED ORDER — LISINOPRIL 20 MG PO TABS: 20.0000 mg | ORAL_TABLET | Freq: Every day | ORAL | Status: DC

## 2014-08-04 MED ORDER — FUROSEMIDE 40 MG PO TABS: 40.0000 mg | ORAL_TABLET | Freq: Every day | ORAL | Status: DC

## 2014-08-04 MED ORDER — METOPROLOL SUCCINATE ER 50 MG PO TB24: 50.0000 mg | ORAL_TABLET | Freq: Two times a day (BID) | ORAL | Status: DC

## 2014-08-04 NOTE — Patient Instructions (Signed)
Your physician has recommended you make the following change in your medication:   START TAKING METOPROLOL 50 MG TWICE A DAY   START TAKING LISINOPRIL 20 MG ONCE A DAY   START TAKING LASIX 40 MG ONCE A DAY     Your physician recommends that you return for lab work in: TODAY CMET CBC INR    Your physician has recommended that you have a Cardioversion (DCCV) AND TEE. Electrical Cardioversion uses a jolt of electricity to your heart either through paddles or wired patches attached to your chest. This is a controlled, usually prescheduled, procedure. Defibrillation is done under light anesthesia in the hospital, and you usually go home the day of the procedure. This is done to get your heart back into a normal rhythm. You are not awake for the procedure. Please see the instruction sheet given to you today.   SCHEDULE ON 08/06/14 @ 1PM  NED TO ARRIVE AT [place] @ 1130 AM

## 2014-08-04 NOTE — Telephone Encounter (Signed)
[facility] called to discuss patients metoprolol rx that was sent in today. The patient was discharged from the hospital on metoprolol tartrate 25mg  bid, and the rx that was sent in today was for metoprolol succinate 50mg  bid. Did Gerald Stabs mean to change this? Please advise. Thanks, MI

## 2014-08-04 NOTE — Telephone Encounter (Signed)
I asked that metoprolol tartrate 50mg  bid be sent in, not succinate.  Thanks,  Marquette Old 08/04/2014, 5:45 PM

## 2014-08-04 NOTE — Progress Notes (Signed)
Patient Name: Bianca Mueller Date of Encounter: 08/04/2014  Primary Care Provider:  Loura Pardon, MD Primary Cardiologist:  She will eventually f/u with Dr. Rockey Situ in [facility]  Patient Profile  80 year old female status post recent hospitalization with non-ST elevation MI complicated by paroxysmal atrial fibrillation and flutter who presents for follow-up.  Problem List   Past Medical History  Diagnosis Date  . Type II diabetes mellitus   . Osteoporosis   . Hyperlipidemia   . Hypertension   . Hypercalcemia   . CAD (coronary artery disease)     a. 1989 s/p MI, TPA, PTCA;  b. 06/2014 NSTEMI/Cath: LM nl, LAD 30-40, D2 90, small, LCX 140m, OM1 90, RCA Cs2+ 70-80p, 50-51m, 90d (? ruptured plaque), EF 55-60%-->Med Rx.  Marland Kitchen [place]     past  . Diverticulosis   . Hx of adenomatous colonic polyps 2010    last colonscopy 2010,Dr Vira Agar - no need for further  colonoscopy given age  . PAF/PAFlutter     a. 06/2014->amio and coumadin started.  . Elevated LFTs     a. 06/2014 Abd U/S: no GB pathology.  . Chronic diastolic CHF (congestive heart failure)     a. 07/2014 Echo: EF 60-65%, Gr 1 DD, mild to mod TR, PASP 30mmHg.   Past Surgical History  Procedure Laterality Date  . Abdominal hysterectomy      ovaries intact  . Shoulder surgery      right    Allergies  Allergies  Allergen Reactions  . Alendronate Sodium   . Isosorbide Other (See Comments)  . Metformin And Related     Decreases appetite resulting in weight loss  . Metronidazole     REACTION: sick/ GI  . Nifedipine Other (See Comments)    HPI  80 year old female status post recent hospitalization beginning in late October secondary to complaints of a tearing pain across her back. She presented to the ED or CT angios of the chest was negative for dissection. Cardiac markers were found to be elevated and she was transferred to [facility] for further evaluation. While hospitalized, she had intermittent hypotension  requiring fluid boluses. 2-D echocardiogram showed normal LV function. She was found to have elevated liver function tests which prevented the use of statin therapy. Doxycycline was discontinued. Abdominal ultrasound showed no gallbladder pathology. She experienced intermittent atrial fibrillation with rapid ventricular response requiring initiation of IV diltiazem as well as amiodarone.  She underwent diagnostic catheterization on November 4 revealing severe diagonal, circumflex OM1, and RCA disease. As it was unclear which stenosis was responsible for troponin elevation, and initial trial of medical therapy was recommended.  Following catheterization, she was placed on Coumadin as well as Plavix. Aspirin was not used. Following discharge, she says she did reasonably well for about a day but has since noticed significant dyspnea on exertion as well as lower extremity edema. Her weight, which she believes was about 111 pounds prior to hospitalization has been 118 pounds since. She had been previously on captopril HCTZ 3 times a day but is currently only on lisinopril HCTZ once daily. She has not had any chest pain or back pain like what she had on admission. She denies PND, orthopnea, dizziness, syncope but has noted early satiety as well as edema as outlined. EKG today shows atrial flutter at a rate of 86 bpm with variable conduction. Review of hospital progress notes shows that she was in atrial flutter on the day of discharge as well.  Home Medications  Prior to Admission medications   Medication Sig Start Date End Date Taking? Authorizing Provider  amiodarone (PACERONE) 200 MG tablet Take 1 tablet (200 mg total) by mouth 2 (two) times daily. 07/29/14  Yes Rhonda G Barrett, PA-C  clopidogrel (PLAVIX) 75 MG tablet Take 1 tablet (75 mg total) by mouth daily with breakfast. 07/29/14  Yes Rhonda G Barrett, PA-C  diltiazem (DILACOR XR) 180 MG 24 hr capsule Take 180 mg by mouth 2 (two) times daily.   Yes  Historical Provider, MD  docusate sodium 100 MG CAPS Take 100 mg by mouth 2 (two) times daily. 07/29/14  Yes Rhonda G Barrett, PA-C  esomeprazole (NEXIUM) 40 MG capsule Take 40 mg by mouth daily at 12 noon.   Yes Historical Provider, MD  glipiZIDE (GLUCOTROL XL) 10 MG 24 hr tablet Take 10 mg by mouth daily with breakfast.   Yes Historical Provider, MD  glucose blood (ONE TOUCH ULTRA TEST) test strip USE ONE STRIP TO CHECK BLOOD SUGAR ONCE DAILY AND PRN FOR DM 250.0 10/02/13  Yes Abner Greenspan, MD  metoprolol tartrate (LOPRESSOR) 25 MG tablet Take 1 tablet (25 mg total) by mouth 2 (two) times daily. 07/29/14  Yes Rhonda G Barrett, PA-C  nitroGLYCERIN (NITROSTAT) 0.4 MG SL tablet Place 1 tablet (0.4 mg total) under the tongue every 5 (five) minutes as needed. For chest pain and if not improved seek medical attention in ER. 07/03/13  Yes Abner Greenspan, MD  [facility] LANCETS 44I MISC Use as instructed to check blood sugar once daily or as needed.  250.00 06/04/14  Yes [place], MD  sitaGLIPtin (JANUVIA) 100 MG tablet Take 100 mg by mouth daily.   Yes Historical Provider, MD  warfarin (COUMADIN) 3 MG tablet Take 1 tablet (3 mg total) by mouth daily at 6 PM. Or as directed 07/29/14  Yes Rhonda G Barrett, PA-C  furosemide (LASIX) 40 MG tablet Take 1 tablet (40 mg total) by mouth daily. 08/04/14   Rogelia Mire, NP  lisinopril (PRINIVIL,ZESTRIL) 20 MG tablet Take 1 tablet (20 mg total) by mouth daily. 08/04/14   Rogelia Mire, NP  metoprolol succinate (TOPROL-XL) 50 MG 24 hr tablet Take 1 tablet (50 mg total) by mouth 2 (two) times daily. Take with or immediately following a meal. 08/04/14   Rogelia Mire, NP    Family History  Family History  Problem Relation Age of Onset  . Heart disease Mother     CAD  . Heart disease Father     CAD  . Heart disease Brother     CAD    Social History  History   Social History  . Marital Status: Married    Spouse Name: N/A     Number of Children: N/A  . Years of Education: N/A   Occupational History  . Not on file.   Social History Main Topics  . Smoking status: Former Research scientist (life sciences)  . Smokeless tobacco: Never Used  . Alcohol Use: No  . Drug Use: No  . Sexual Activity: Not Currently   Other Topics Concern  . Not on file   Social History Narrative     Review of Systems General:  No chills, fever, night sweats or weight changes.  Cardiovascular:  No chest pain, positive dyspnea on exertion with poor exercise tolerance, bilateral lower extremity edema, no orthopnea, palpitations, paroxysmal nocturnal dyspnea. Dermatological: No rash, lesions/masses Respiratory: No cough, positive dyspnea Urologic: No hematuria, dysuria Abdominal:   No  nausea, vomiting, diarrhea, bright red blood per rectum, melena, or hematemesis Neurologic:  No visual changes, wkns, changes in mental status. All other systems reviewed and are otherwise negative except as noted above.  Physical Exam  Blood pressure 110/60, pulse 86, height 5\' 2"  (1.575 m), weight 118 lb (53.524 kg).  General: Pleasant, NAD Psych: Normal affect. Neuro: Alert and oriented X 3. Moves all extremities spontaneously. HEENT: Normal  Neck: Supple without bruits. JVP approximately 12 cm. Lungs:  Resp regular and unlabored, CTA. Heart: irregularly irregular no s3, s4, or murmurs. Abdomen: Soft, non-tender, non-distended, BS + x 4.  Extremities: No clubbing, cyanosis. 2+ bilateral lower extremity edema to mid calf. DP/PT/Radials 2+ and equal bilaterally.  Accessory Clinical Findings  ECG - atrial flutter with variable conduction, 86, no acute ST or T changes.  Assessment & Plan  1.  Acute on chronic diastolic congestive heart failure: Patient was recently discharged from [facility] on November 11 following admission for chest pain and non-ST elevation MI. She was found to have normal LV function with grade 1 diastolic dysfunction. She also developed atrial flutter  and was placed on diltiazem and an amiodarone along with Coumadin.since discharge, she has noted significant lower extremity swelling, dyspnea exertion, and weight of 118 pounds, which is approximately 7 pounds above her preadmission weight. She does have volume overload on exam. I suspect ongoing atrial flutter may be driving her diastolic dysfunction and failure. I discussed her case with Dr. Johnsie Cancel today.  I will change her from lisinopril HCTZ to lisinopril 20 mg daily and add Lasix 40 mg daily. With ongoing atrial flutter, we will continue amiodarone and also increase metoprolol to 59 g twice a day. I will check a complete metabolic panel, CBC, and INR today with a plan for cardioversion on Thursday, November 19. Of note, she was anticoagulated with heparin and then Coumadin during hospitalization and was discharged with a therapeutic INR of 2.58. His INR is therapeutic today, she should not require TEE however if she is subtherapeutic today, we would have to adjust her dose and repeat an INR on Thursday with plan for TEE in addition to cardioversion.  2. Atrial flutter: This is reasonably well rate controlled on beta blocker, diltiazem, and amiodarone therapy however as above, I suspect this is the driving force behind her volume overload and reduced exercise tolerance. Check labs today and schedule for cardioversion plus or minus TEE depending on INR, for Thursday.  3. Hypertension: Stable.  4. Hyperlipidemia: she is not on a statin second her to elevated LFTs during hospitalization.  5. Non-ST segment elevation myocardial infarction, subsequent episode of care/Coronary artery disease: Reduced exercise tolerance may be secondary to significant multivessel CAD. Would like to see how she does in sinus rhythm and if she continues to feel poorly despite sinus rhythm and optimal volume, we may need to consider percutaneous intervention to the right coronary artery and/or obtuse marginal as was previously  outlined in the catheterization note by Dr. Tamala Julian. She remains on Plavix, beta blocker, and short acting nitrates. She is not on a statin secondary to elevated LFTs while hospitalized.she is not on aspirin as she is on both Coumadin and Plavix.  6. Elevated LFTs: This was felt to be secondary to doxycycline therapy while hospitalized.  LFTs trended back towards normal prior to discharge. Gallbladder ultrasound was negative for pathology. Will follow up with labs today.  7.  DMII:  Per Dr. Glori Bickers.  8. Disposition: Labs today with plan for cardioversion  plus minus TEE on Thursday. She has follow-up with Dr. Rockey Situ in [place] on 12/3.   Murray Hodgkins, NP 08/04/2014, 4:57 PM

## 2014-08-05 ENCOUNTER — Other Ambulatory Visit: Payer: Self-pay | Admitting: *Deleted

## 2014-08-05 ENCOUNTER — Telehealth: Payer: Self-pay | Admitting: *Deleted

## 2014-08-05 LAB — COMPREHENSIVE METABOLIC PANEL
ALT: 43 U/L — ABNORMAL HIGH (ref 0–35)
AST: 36 U/L (ref 0–37)
Albumin: 3.2 g/dL — ABNORMAL LOW (ref 3.5–5.2)
Alkaline Phosphatase: 76 U/L (ref 39–117)
BUN: 24 mg/dL — ABNORMAL HIGH (ref 6–23)
CO2: 24 mEq/L (ref 19–32)
CREATININE: 0.9 mg/dL (ref 0.4–1.2)
Calcium: 8.8 mg/dL (ref 8.4–10.5)
Chloride: 102 mEq/L (ref 96–112)
GFR: 60.76 mL/min (ref >60.00)
GLUCOSE: 263 mg/dL — AB (ref 70–99)
Potassium: 4.4 mEq/L (ref 3.5–5.1)
SODIUM: 135 meq/L (ref 135–145)
TOTAL PROTEIN: 7 g/dL (ref 6.0–8.3)
Total Bilirubin: 0.4 mg/dL (ref 0.2–1.2)

## 2014-08-05 LAB — CBC
HCT: 35.5 % — ABNORMAL LOW (ref 36.0–46.0)
HEMOGLOBIN: 11.4 g/dL — AB (ref 12.0–15.0)
MCHC: 32 g/dL (ref 30.0–36.0)
MCV: 88.9 fl (ref 78.0–100.0)
PLATELETS: 531 10*3/uL — AB (ref 150.0–400.0)
RBC: 3.99 Mil/uL (ref 3.87–5.11)
RDW: 15.7 % — ABNORMAL HIGH (ref 11.5–15.5)
WBC: 10.2 10*3/uL (ref 4.0–10.5)

## 2014-08-05 LAB — PROTIME-INR
INR: 5.7 ratio (ref 0.8–1.0)
Prothrombin Time: 60.2 s (ref 9.6–13.1)

## 2014-08-05 MED ORDER — METOPROLOL TARTRATE 50 MG PO TABS: 50.0000 mg | ORAL_TABLET | Freq: Two times a day (BID) | ORAL | Status: DC

## 2014-08-05 NOTE — Telephone Encounter (Signed)
Correct rx sent in 

## 2014-08-05 NOTE — Telephone Encounter (Signed)
Received call from lab of critical PT/INR. Results in computer and reviewed with Dr. Johnsie Cancel. Per Dr. Johnsie Cancel pt should not take coumadin today and have PT/INR checked at hospital tomorrow prior to TEE/Cardioversion.  I spoke with pt and her daughter [name] and gave them this information. They both confirmed pt takes coumadin in the evening and has not taken today.  They are also aware not to take tomorrow morning and they will be instructed at procedure tomorrow when to resume. I spoke with Benjamine Mola (charge nurse in endo) and made her aware. She will order labs to be done tomorrow upon pt's arrival.

## 2014-08-06 ENCOUNTER — Encounter (HOSPITAL_COMMUNITY)
Admission: RE | Disposition: A | Discharge status: Home / Self Care | Payer: Self-pay | Source: Ambulatory Visit | Attending: Cardiovascular Disease

## 2014-08-06 ENCOUNTER — Ambulatory Visit (HOSPITAL_COMMUNITY)
Admission: RE | Admit: 2014-08-06 | Discharge: 2014-08-06 | Disposition: A | Discharge status: Home / Self Care | Payer: Medicare HMO | Source: Ambulatory Visit | Attending: Cardiovascular Disease | Admitting: Cardiovascular Disease

## 2014-08-06 ENCOUNTER — Ambulatory Visit (INDEPENDENT_AMBULATORY_CARE_PROVIDER_SITE_OTHER): Payer: Commercial Managed Care - HMO

## 2014-08-06 DIAGNOSIS — I4892 Unspecified atrial flutter: Secondary | ICD-10-CM

## 2014-08-06 DIAGNOSIS — I5032 Chronic diastolic (congestive) heart failure: Secondary | ICD-10-CM | POA: Diagnosis present

## 2014-08-06 DIAGNOSIS — I2510 Atherosclerotic heart disease of native coronary artery without angina pectoris: Secondary | ICD-10-CM | POA: Diagnosis present

## 2014-08-06 DIAGNOSIS — I481 Persistent atrial fibrillation: Secondary | ICD-10-CM | POA: Diagnosis not present

## 2014-08-06 DIAGNOSIS — Z5181 Encounter for therapeutic drug level monitoring: Secondary | ICD-10-CM

## 2014-08-06 DIAGNOSIS — I4891 Unspecified atrial fibrillation: Secondary | ICD-10-CM

## 2014-08-06 DIAGNOSIS — I5033 Acute on chronic diastolic (congestive) heart failure: Secondary | ICD-10-CM | POA: Diagnosis not present

## 2014-08-06 LAB — POCT INR: INR: 4.6

## 2014-08-06 SURGERY — CANCELLED PROCEDURE

## 2014-08-06 MED ORDER — SODIUM CHLORIDE 0.9 % IV SOLN: INTRAVENOUS | Status: DC

## 2014-08-06 NOTE — Progress Notes (Signed)
Patient arranged to have [facility] by Ignacia Bayley PA Arrived in NSR rate 58 INR 11/17 5.3  Did not take coumadin yesterday To office today to check and advise   Continue lasix for volume overload new F/U Sharolyn Douglas and primary cardiologist as scheduled  Jenkins Rouge

## 2014-08-06 NOTE — Patient Instructions (Signed)

## 2014-08-07 ENCOUNTER — Emergency Department (HOSPITAL_COMMUNITY): Payer: Medicare HMO

## 2014-08-07 ENCOUNTER — Inpatient Hospital Stay (HOSPITAL_COMMUNITY)
Admission: EM | Admit: 2014-08-07 | Discharge: 2014-08-11 | DRG: 293 | Disposition: A | Discharge status: Home / Self Care | Payer: Medicare HMO | Attending: Cardiology | Admitting: Cardiology

## 2014-08-07 ENCOUNTER — Encounter (HOSPITAL_COMMUNITY): Payer: Self-pay | Admitting: Family Medicine

## 2014-08-07 ENCOUNTER — Telehealth: Payer: Self-pay | Admitting: Cardiovascular Disease

## 2014-08-07 DIAGNOSIS — E7800 Pure hypercholesterolemia, unspecified: Secondary | ICD-10-CM | POA: Diagnosis present

## 2014-08-07 DIAGNOSIS — Z7901 Long term (current) use of anticoagulants: Secondary | ICD-10-CM

## 2014-08-07 DIAGNOSIS — Z87891 Personal history of nicotine dependence: Secondary | ICD-10-CM | POA: Diagnosis not present

## 2014-08-07 DIAGNOSIS — M810 Age-related osteoporosis without current pathological fracture: Secondary | ICD-10-CM | POA: Diagnosis present

## 2014-08-07 DIAGNOSIS — D473 Essential (hemorrhagic) thrombocythemia: Secondary | ICD-10-CM | POA: Diagnosis present

## 2014-08-07 DIAGNOSIS — Z86010 Personal history of colonic polyps: Secondary | ICD-10-CM | POA: Diagnosis not present

## 2014-08-07 DIAGNOSIS — Z888 Allergy status to other drugs, medicaments and biological substances status: Secondary | ICD-10-CM

## 2014-08-07 DIAGNOSIS — E876 Hypokalemia: Secondary | ICD-10-CM | POA: Diagnosis present

## 2014-08-07 DIAGNOSIS — I10 Essential (primary) hypertension: Secondary | ICD-10-CM | POA: Diagnosis present

## 2014-08-07 DIAGNOSIS — I5033 Acute on chronic diastolic (congestive) heart failure: Secondary | ICD-10-CM | POA: Diagnosis present

## 2014-08-07 DIAGNOSIS — I509 Heart failure, unspecified: Secondary | ICD-10-CM

## 2014-08-07 DIAGNOSIS — E785 Hyperlipidemia, unspecified: Secondary | ICD-10-CM | POA: Diagnosis present

## 2014-08-07 DIAGNOSIS — I2510 Atherosclerotic heart disease of native coronary artery without angina pectoris: Secondary | ICD-10-CM | POA: Diagnosis present

## 2014-08-07 DIAGNOSIS — I480 Paroxysmal atrial fibrillation: Secondary | ICD-10-CM | POA: Diagnosis present

## 2014-08-07 DIAGNOSIS — I4819 Other persistent atrial fibrillation: Secondary | ICD-10-CM

## 2014-08-07 DIAGNOSIS — R110 Nausea: Secondary | ICD-10-CM | POA: Diagnosis present

## 2014-08-07 DIAGNOSIS — D72829 Elevated white blood cell count, unspecified: Secondary | ICD-10-CM | POA: Diagnosis present

## 2014-08-07 DIAGNOSIS — R740 Nonspecific elevation of levels of transaminase and lactic acid dehydrogenase [LDH]: Secondary | ICD-10-CM | POA: Diagnosis present

## 2014-08-07 DIAGNOSIS — I252 Old myocardial infarction: Secondary | ICD-10-CM | POA: Diagnosis not present

## 2014-08-07 DIAGNOSIS — I4891 Unspecified atrial fibrillation: Secondary | ICD-10-CM | POA: Diagnosis present

## 2014-08-07 DIAGNOSIS — E119 Type 2 diabetes mellitus without complications: Secondary | ICD-10-CM

## 2014-08-07 DIAGNOSIS — R001 Bradycardia, unspecified: Secondary | ICD-10-CM | POA: Diagnosis present

## 2014-08-07 DIAGNOSIS — I481 Persistent atrial fibrillation: Secondary | ICD-10-CM | POA: Diagnosis present

## 2014-08-07 HISTORY — DX: Unspecified osteoarthritis, unspecified site: M19.90

## 2014-08-07 HISTORY — DX: Gastro-esophageal reflux disease without esophagitis: K21.9

## 2014-08-07 HISTORY — DX: Acute myocardial infarction, unspecified: I21.9

## 2014-08-07 LAB — BASIC METABOLIC PANEL
ANION GAP: 19 — AB (ref 5–15)
BUN: 22 mg/dL (ref 6–23)
CO2: 19 mEq/L (ref 19–32)
Calcium: 8.9 mg/dL (ref 8.4–10.5)
Chloride: 95 mEq/L — ABNORMAL LOW (ref 96–112)
Creatinine, Ser: 0.73 mg/dL (ref 0.50–1.10)
GFR calc non Af Amer: 76 mL/min — ABNORMAL LOW (ref >90)
GFR, EST AFRICAN AMERICAN: 88 mL/min — AB (ref >90)
Glucose, Bld: 295 mg/dL — ABNORMAL HIGH (ref 70–99)
POTASSIUM: 5 meq/L (ref 3.7–5.3)
Sodium: 133 mEq/L — ABNORMAL LOW (ref 137–147)

## 2014-08-07 LAB — CBC
HCT: 37.4 % (ref 36.0–46.0)
Hemoglobin: 12.2 g/dL (ref 12.0–15.0)
MCH: 28.6 pg (ref 26.0–34.0)
MCHC: 32.6 g/dL (ref 30.0–36.0)
MCV: 87.8 fL (ref 78.0–100.0)
PLATELETS: 608 10*3/uL — AB (ref 150–400)
RBC: 4.26 MIL/uL (ref 3.87–5.11)
RDW: 15.8 % — AB (ref 11.5–15.5)
WBC: 14.1 10*3/uL — AB (ref 4.0–10.5)

## 2014-08-07 LAB — GLUCOSE, CAPILLARY
Glucose-Capillary: 193 mg/dL — ABNORMAL HIGH (ref 70–99)
Glucose-Capillary: 224 mg/dL — ABNORMAL HIGH (ref 70–99)

## 2014-08-07 LAB — PROTIME-INR
INR: 2.86 — AB (ref 0.00–1.49)
Prothrombin Time: 30.3 seconds — ABNORMAL HIGH (ref 11.6–15.2)

## 2014-08-07 LAB — I-STAT TROPONIN, ED: TROPONIN I, POC: 0.02 ng/mL (ref 0.00–0.08)

## 2014-08-07 LAB — PRO B NATRIURETIC PEPTIDE: Pro B Natriuretic peptide (BNP): 1965 pg/mL — ABNORMAL HIGH (ref 0–450)

## 2014-08-07 MED ORDER — ONDANSETRON HCL 4 MG/2ML IJ SOLN: 4.0000 mg | Freq: Four times a day (QID) | INTRAMUSCULAR | Status: DC | PRN

## 2014-08-07 MED ORDER — SODIUM CHLORIDE 0.9 % IJ SOLN
3.0000 mL | Freq: Two times a day (BID) | INTRAMUSCULAR | Status: DC
  Administered 2014-08-07 – 2014-08-11 (×8): 3 mL via INTRAVENOUS

## 2014-08-07 MED ORDER — METOPROLOL TARTRATE 50 MG PO TABS
50.0000 mg | ORAL_TABLET | Freq: Two times a day (BID) | ORAL | Status: DC
  Administered 2014-08-07 – 2014-08-11 (×8): 50 mg via ORAL
  Filled 2014-08-07 (×9): qty 1

## 2014-08-07 MED ORDER — AMIODARONE HCL 200 MG PO TABS
200.0000 mg | ORAL_TABLET | Freq: Two times a day (BID) | ORAL | Status: DC
  Administered 2014-08-07 – 2014-08-11 (×8): 200 mg via ORAL
  Filled 2014-08-07 (×9): qty 1

## 2014-08-07 MED ORDER — DOCUSATE SODIUM 100 MG PO CAPS
100.0000 mg | ORAL_CAPSULE | Freq: Two times a day (BID) | ORAL | Status: DC
  Administered 2014-08-07 – 2014-08-11 (×8): 100 mg via ORAL
  Filled 2014-08-07 (×9): qty 1

## 2014-08-07 MED ORDER — FUROSEMIDE 10 MG/ML IJ SOLN
40.0000 mg | Freq: Two times a day (BID) | INTRAMUSCULAR | Status: DC
  Administered 2014-08-07 – 2014-08-11 (×8): 40 mg via INTRAVENOUS
  Filled 2014-08-07 (×9): qty 4

## 2014-08-07 MED ORDER — DILTIAZEM HCL ER COATED BEADS 360 MG PO CP24
360.0000 mg | ORAL_CAPSULE | Freq: Every day | ORAL | Status: DC
  Administered 2014-08-08: 360 mg via ORAL
  Filled 2014-08-07: qty 1

## 2014-08-07 MED ORDER — SODIUM CHLORIDE 0.9 % IV SOLN
250.0000 mL | INTRAVENOUS | Status: DC | PRN
Start: 2014-08-07 — End: 2014-08-11

## 2014-08-07 MED ORDER — PANTOPRAZOLE SODIUM 40 MG PO TBEC
40.0000 mg | DELAYED_RELEASE_TABLET | Freq: Every day | ORAL | Status: DC
  Administered 2014-08-07 – 2014-08-11 (×5): 40 mg via ORAL
  Filled 2014-08-07 (×3): qty 1

## 2014-08-07 MED ORDER — ACETAMINOPHEN 325 MG PO TABS
650.0000 mg | ORAL_TABLET | ORAL | Status: DC | PRN
  Administered 2014-08-08 – 2014-08-10 (×3): 650 mg via ORAL
  Filled 2014-08-07 (×3): qty 2

## 2014-08-07 MED ORDER — SODIUM CHLORIDE 0.9 % IJ SOLN: 3.0000 mL | INTRAMUSCULAR | Status: DC | PRN

## 2014-08-07 MED ORDER — INSULIN ASPART 100 UNIT/ML ~~LOC~~ SOLN
0.0000 [IU] | Freq: Three times a day (TID) | SUBCUTANEOUS | Status: DC
  Administered 2014-08-08: 1 [IU] via SUBCUTANEOUS
  Administered 2014-08-08 (×2): 3 [IU] via SUBCUTANEOUS
  Administered 2014-08-09: 2 [IU] via SUBCUTANEOUS
  Administered 2014-08-09 (×2): 3 [IU] via SUBCUTANEOUS
  Administered 2014-08-10: 2 [IU] via SUBCUTANEOUS
  Administered 2014-08-10: 3 [IU] via SUBCUTANEOUS
  Administered 2014-08-10 – 2014-08-11 (×3): 2 [IU] via SUBCUTANEOUS

## 2014-08-07 MED ORDER — FUROSEMIDE 10 MG/ML IJ SOLN
40.0000 mg | Freq: Once | INTRAMUSCULAR | Status: AC
  Administered 2014-08-07: 40 mg via INTRAVENOUS
  Filled 2014-08-07: qty 4

## 2014-08-07 MED ORDER — LISINOPRIL 20 MG PO TABS
20.0000 mg | ORAL_TABLET | Freq: Every day | ORAL | Status: DC
  Administered 2014-08-07 – 2014-08-11 (×5): 20 mg via ORAL
  Filled 2014-08-07 (×5): qty 1

## 2014-08-07 NOTE — Telephone Encounter (Signed)
LEFT MESSAGE FOR DAUGHTER MAY CALL BACK IF NEEDS  TO  BUT   APPEARS  PT HAS  BEEN ADMITTED TO HOSPITAL  FOR  CHF ./CY

## 2014-08-07 NOTE — ED Notes (Signed)
Pt remains monitored by blood pressure, pulse ox, and 5 lead. pts family remains at bedside.  

## 2014-08-07 NOTE — ED Notes (Signed)
Pt placed on 2L nasal cannula for comfort. Was 95% RA. Reports feels SOB.

## 2014-08-07 NOTE — ED Provider Notes (Addendum)
CSN: [id number]     Arrival date & time 08/07/14  1048 History   First MD Initiated Contact with Patient 08/07/14 1106     Chief Complaint  Patient presents with  . Shortness of Breath     (Consider location/radiation/quality/duration/timing/severity/associated sxs/prior Treatment) HPI Comments: Patient recently hospitalized for N STEMI and CHF. She had a catheterization done in the hospital without stent placement. She was discharged approximately 1 week ago and has had a gradually worsening shortness of breath. She saw her cardiologist on Tuesday and at that time was found to have atrial fibrillation for the first time. She is scheduled for cardioversion yesterday however when she arrived to the hospital she was in sinus rhythm and did not receive cardioversion. However she felt like her heart went back out of rhythm last night and has had worsening shortness of breath sounds. When she left the hospital she could walk approximately 100 yards without becoming short of breath and now she has difficulty walking more than 10 feet. Her weight has remained stable and she feels that the swelling in her feet has improved. She is still taking her Lasix  Patient is a 80 y.o. female presenting with shortness of breath. The history is provided by the patient.  Shortness of Breath Severity:  Severe Onset quality:  Gradual Duration:  3 days Timing:  Constant Progression:  Worsening Chronicity:  Recurrent Context: activity   Context comment:  Started after feeling heart out of rhythm Relieved by:  Rest and sitting up Worsened by:  Activity Ineffective treatments:  None tried Associated symptoms: cough   Associated symptoms: no abdominal pain, no chest pain, no fever, no sputum production, no vomiting and no wheezing   Associated symptoms comment:  Improved swelling in the legs   Past Medical History  Diagnosis Date  . Type II diabetes mellitus   . Osteoporosis   . Hyperlipidemia   .  Hypertension   . Hypercalcemia   . CAD (coronary artery disease)     a. 1989 s/p MI, TPA, PTCA;  b. 06/2014 NSTEMI/Cath: LM nl, LAD 30-40, D2 90, small, LCX 131m, OM1 90, RCA Cs2+ 70-80p, 50-63m, 90d (? ruptured plaque), EF 55-60%-->Med Rx.  Marland Kitchen Zoster     past  . Diverticulosis   . Hx of adenomatous colonic polyps 2010    last colonscopy 2010,Dr Vira Agar - no need for further  colonoscopy given age  . PAF/PAFlutter     a. 06/2014->amio and coumadin started.  . Elevated LFTs     a. 06/2014 Abd U/S: no GB pathology.  . Chronic diastolic CHF (congestive heart failure)     a. 07/2014 Echo: EF 60-65%, Gr 1 DD, mild to mod TR, PASP 33mmHg.   Past Surgical History  Procedure Laterality Date  . Abdominal hysterectomy      ovaries intact  . Shoulder surgery      right   Family History  Problem Relation Age of Onset  . Heart disease Mother     CAD  . Heart disease Father     CAD  . Heart disease Brother     CAD   History  Substance Use Topics  . Smoking status: Former Research scientist (life sciences)  . Smokeless tobacco: Never Used  . Alcohol Use: No   OB History    No data available     Review of Systems  Constitutional: Negative for fever.  Respiratory: Positive for cough and shortness of breath. Negative for sputum production and wheezing.   Cardiovascular:  Negative for chest pain.  Gastrointestinal: Negative for vomiting and abdominal pain.  All other systems reviewed and are negative.     Allergies  Alendronate sodium; Isosorbide; Metformin and related; Metronidazole; and Nifedipine  Home Medications   Prior to Admission medications   Medication Sig Start Date End Date Taking? Authorizing Provider  amiodarone (PACERONE) 200 MG tablet Take 1 tablet (200 mg total) by mouth 2 (two) times daily. 07/29/14   Evelene Croon Barrett, PA-C  clopidogrel (PLAVIX) 75 MG tablet Take 1 tablet (75 mg total) by mouth daily with breakfast. 07/29/14   Evelene Croon Barrett, PA-C  Cyanocobalamin (VITAMIN B-12 PO)  Take 1 tablet by mouth daily.    Historical Provider, MD  diltiazem (CARTIA XT) 180 MG 24 hr capsule Take 180 mg by mouth 2 (two) times daily.    Historical Provider, MD  docusate sodium 100 MG CAPS Take 100 mg by mouth 2 (two) times daily. 07/29/14   Rhonda G Barrett, PA-C  esomeprazole (NEXIUM) 40 MG capsule Take 40 mg by mouth daily at 12 noon.    Historical Provider, MD  furosemide (LASIX) 40 MG tablet Take 1 tablet (40 mg total) by mouth daily. 08/04/14   Rogelia Mire, NP  glipiZIDE (GLUCOTROL XL) 10 MG 24 hr tablet Take 10 mg by mouth daily with breakfast.    Historical Provider, MD  glucose blood (ONE TOUCH ULTRA TEST) test strip USE ONE STRIP TO CHECK BLOOD SUGAR ONCE DAILY AND PRN FOR DM 250.0 10/02/13   Abner Greenspan, MD  lisinopril (PRINIVIL,ZESTRIL) 20 MG tablet Take 1 tablet (20 mg total) by mouth daily. 08/04/14   Rogelia Mire, NP  metoprolol (LOPRESSOR) 50 MG tablet Take 1 tablet (50 mg total) by mouth 2 (two) times daily. 08/05/14   Minna Merritts, MD  nitroGLYCERIN (NITROSTAT) 0.4 MG SL tablet Place 1 tablet (0.4 mg total) under the tongue every 5 (five) minutes as needed. For chest pain and if not improved seek medical attention in ER. 07/03/13   Abner Greenspan, MD  [facility] LANCETS 62Z MISC Use as instructed to check blood sugar once daily or as needed.  250.00 06/04/14   Marne A Tower, MD  sitaGLIPtin (JANUVIA) 100 MG tablet Take 100 mg by mouth daily.    Historical Provider, MD  warfarin (COUMADIN) 3 MG tablet Take 1 tablet (3 mg total) by mouth daily at 6 PM. Or as directed 07/29/14   Evelene Croon Barrett, PA-C   BP 114/71 mmHg  Pulse 79  Temp(Src) 97.3 F (36.3 C)  Resp 18  Wt 118 lb 5 oz (53.666 kg)  SpO2 99% Physical Exam  Constitutional: She is oriented to person, place, and time. She appears well-developed and well-nourished. No distress.  HENT:  Head: Normocephalic and atraumatic.  Mouth/Throat: Oropharynx is clear and moist.  Eyes: Conjunctivae  and EOM are normal. Pupils are equal, round, and reactive to light.  Neck: Normal range of motion. Neck supple.  Cardiovascular: Normal rate and intact distal pulses.  An irregularly irregular rhythm present.  No murmur heard. Pulmonary/Chest: Effort normal. No respiratory distress. She has decreased breath sounds in the right lower field and the left lower field. She has no wheezes. She has no rales.  Abdominal: Soft. She exhibits no distension. There is no tenderness. There is no rebound and no guarding.  Musculoskeletal: Normal range of motion. She exhibits edema. She exhibits no tenderness.  2+ pitting edema bilaterally up to the midshin  Neurological: She  is alert and oriented to person, place, and time.  Skin: Skin is warm and dry. No rash noted. No erythema.  Psychiatric: She has a normal mood and affect. Her behavior is normal.  Nursing note and vitals reviewed.   ED Course  Procedures (including critical care time) Labs Review Labs Reviewed  BASIC METABOLIC PANEL - Abnormal; Notable for the following:    Sodium 133 (*)    Chloride 95 (*)    Glucose, Bld 295 (*)    GFR calc non Af Amer 76 (*)    GFR calc Af Amer 88 (*)    Anion gap 19 (*)    All other components within normal limits  PRO B NATRIURETIC PEPTIDE - Abnormal; Notable for the following:    Pro B Natriuretic peptide (BNP) 1965.0 (*)    All other components within normal limits  CBC - Abnormal; Notable for the following:    WBC 14.1 (*)    RDW 15.8 (*)    Platelets 608 (*)    All other components within normal limits  PROTIME-INR  Randolm Idol, ED    Imaging Review Dg Chest Port 1 View  08/07/2014   CLINICAL DATA:  81 year old female with a history of chest tightness and weakness. Hypertension. Diabetes.  EXAM: PORTABLE CHEST - 1 VIEW  COMPARISON:  Chest CT 07/18/2014, chest x-ray 07/18/2014  FINDINGS: The cardiac diameter projects slightly increased from the comparison chest x-ray dated 07/18/2014.   Cardiac border is partially obscured by bilateral lung/pleural disease.  Atherosclerotic calcifications of the aortic arch.  Bibasilar opacities, partially obscuring hemidiaphragms on the heart borders.  No pneumothorax.  Osteopenia.  No displaced fracture.  IMPRESSION: Interval development of bilateral pleural effusions and associated atelectasis and/ or consolidation. No evidence of overt edema.  Cardiac diameter appears slightly greater than the comparison chest x-ray. This may be projectional, secondary to reduced lung volumes, however, persisting or increasing pericardial fluid/effusion cannot be excluded. Further evaluation with cardiac ECHO may be useful.  Atherosclerosis.  Signed,  Dulcy Fanny. [name], DO  [facility]  [facility]   Electronically Signed   By: Corrie Mckusick D.O.   On: 08/07/2014 11:34     EKG Interpretation   Date/Time:  Friday August 07 2014 10:53:28 EST Ventricular Rate:  86 PR Interval:    QRS Duration: 78 QT Interval:  432 QTC Calculation: 516 R Axis:   88 Text Interpretation:  new  Atrial fibrillation ST \\T \ T wave abnormality,  consider inferior ischemia Prolonged QT Confirmed by Maryan Rued  MD, Loree Fee  [phone]) on 08/07/2014 11:07:43 AM      MDM   Final diagnoses:  Acute on chronic congestive heart failure, unspecified congestive heart failure type  Persistent atrial fibrillation    Patient with a significant history of recent admission for N STEMI and fluid overload versus discharge hospital approximately 1 week ago and followed up with cardiology on Tuesday of this week and was found to have A. fib and was scheduled for cardioversion yesterday. However when she came to the hospital she was in sinus rhythm and went home. However she felt that she went out of rhythm last night and has had significantly worsening shortness of breath since that time. Prior to discharge she can walk 100 yards and breathe fairly  comfortably and now she is unable to walk more than 10 feet without becoming extremely short of breath. She denies any chest pain or infectious symptoms. She has bilateral lower extremity edema  which she states is improved and her weight has not changed.  Today she is in atrial fibrillation with rate control. She has decreased breath sounds in the lower lobes without  obvious rales. At rest on room air oxygenation is 99%.  Most likely patient's atrial fibrillation is a significant contributing factor to her shortness of breath.  CBC, BMP, BNP, troponin, INR, chest x-ray pending  1:12 PM Pt with new pleural effusions and possible worse cardiomegaly.  Given IV lasix and cards to see.   Blanchie Dessert, MD 08/07/14 [place], MD 08/07/14 1314

## 2014-08-07 NOTE — H&P (Signed)
JACILYN SANPEDRO is an 80 y.o. female.   Chief Complaint:  SOB HPI:   80 year old female status post recent hospitalization beginning in late October secondary to complaints of a tearing pain across her back. She presented to the ED or CT angios of the chest was negative for dissection. Cardiac markers were found to be elevated and she was transferred to [facility] for further evaluation. While hospitalized, she had intermittent hypotension requiring fluid boluses. 2-D echocardiogram showed normal LV function. She was found to have elevated liver function tests which prevented the use of statin therapy. Doxycycline was discontinued. Abdominal ultrasound showed no gallbladder pathology. She experienced intermittent atrial fibrillation with rapid ventricular response requiring initiation of IV diltiazem as well as amiodarone. She underwent diagnostic catheterization on November 4 revealing severe diagonal, circumflex OM1, and RCA disease. As it was unclear which stenosis was responsible for troponin elevation, and initial trial of medical therapy was recommended. Following catheterization, she was placed on Coumadin as well as Plavix. Aspirin was not used. Following discharge, she says she did reasonably well for about a day but has since noticed significant dyspnea on exertion as well as lower extremity edema. Her weight, which she believes was about 111 pounds prior to hospitalization has been 118 pounds since. She had been previously on captopril HCTZ 3 times a day but is currently only on lisinopril HCTZ once daily. She has not had any chest pain or back pain like what she had on admission. She denies PND, orthopnea, dizziness, syncope but has noted early satiety as well as edema as outlined. EKG today shows atrial flutter at a rate of 86 bpm with variable conduction. Review of hospital progress notes shows that she was in atrial flutter on the day of discharge as well.  The patient was seen in the  clinic on 08/04/14.  HCTZ was stopped and she was started on PO lasix 50m daily.  There was plans to cardiovert her on 11/19 however, [facility]   She presents to day with SOB.. She is afib with a well controlled rate.  CXR reveals bilateral pleural effusions.  BNP is 1965.00.  The patient reports feeling ill/poor about two days after the last DC however, she developed SOB this morning.  IT is worse when lying down and as a result she slept on the couch.  +LEE which has not improved since being on the lasix.  She is avoiding salt.  +Chills, diaphoresis, nausea and cough(while lying down).  The patient currently denies vomiting, fever, chest pain, dizziness, congestion, abdominal pain, hematochezia, dysuria, melena, claudication.  Filed Weights   08/07/14 1104  Weight: 118 lb 5 oz (53.666 kg)    Medications: Prior to Admission medications   Medication Sig Start Date End Date Taking? Authorizing Provider  amiodarone (PACERONE) 200 MG tablet Take 1 tablet (200 mg total) by mouth 2 (two) times daily. 07/29/14  Yes Rhonda G Barrett, PA-C  clopidogrel (PLAVIX) 75 MG tablet Take 1 tablet (75 mg total) by mouth daily with breakfast. 07/29/14  Yes Rhonda G Barrett, PA-C  Cyanocobalamin (VITAMIN B-12 PO) Take 1 tablet by mouth daily.   Yes Historical Provider, MD  diltiazem (CARTIA XT) 180 MG 24 hr capsule Take 360 mg by mouth daily.    Yes Historical Provider, MD  docusate sodium 100 MG CAPS Take 100 mg by mouth 2 (two) times daily. 07/29/14  Yes Rhonda G Barrett, PA-C  esomeprazole (  NEXIUM) 40 MG capsule Take 40 mg by mouth daily at 12 noon.   Yes Historical Provider, MD  furosemide (LASIX) 40 MG tablet Take 1 tablet (40 mg total) by mouth daily. 08/04/14  Yes Rogelia Mire, NP  glipiZIDE (GLUCOTROL XL) 10 MG 24 hr tablet Take 10 mg by mouth daily with breakfast.   Yes Historical Provider, MD  lisinopril (PRINIVIL,ZESTRIL) 20 MG tablet Take 1 tablet (20 mg total)  by mouth daily. 08/04/14  Yes Rogelia Mire, NP  metoprolol (LOPRESSOR) 50 MG tablet Take 1 tablet (50 mg total) by mouth 2 (two) times daily. 08/05/14  Yes Minna Merritts, MD  nitroGLYCERIN (NITROSTAT) 0.4 MG SL tablet Place 1 tablet (0.4 mg total) under the tongue every 5 (five) minutes as needed. For chest pain and if not improved seek medical attention in ER. 07/03/13  Yes Abner Greenspan, MD  sitaGLIPtin (JANUVIA) 100 MG tablet Take 100 mg by mouth daily.   Yes Historical Provider, MD  glucose blood (ONE TOUCH ULTRA TEST) test strip USE ONE STRIP TO CHECK BLOOD SUGAR ONCE DAILY AND PRN FOR DM 250.0 10/02/13   Abner Greenspan, MD  [facility] DELICA LANCETS 01V MISC Use as instructed to check blood sugar once daily or as needed.  250.00 06/04/14   Abner Greenspan, MD  warfarin (COUMADIN) 3 MG tablet Take 1 tablet (3 mg total) by mouth daily at 6 PM. Or as directed 07/29/14   Lonn Georgia, PA-C     Past Medical History  Diagnosis Date  . Type II diabetes mellitus   . Osteoporosis   . Hyperlipidemia   . Hypertension   . Hypercalcemia   . CAD (coronary artery disease)     a. 1989 s/p MI, TPA, PTCA;  b. 06/2014 NSTEMI/Cath: LM nl, LAD 30-40, D2 90, small, LCX 145m OM1 90, RCA Cs2+ 70-80p, 50-636m90d (? ruptured plaque), EF 55-60%-->Med Rx.  . Marland Kitchenoster     past  . Diverticulosis   . Hx of adenomatous colonic polyps 2010    last colonscopy 2010,Dr ElVira Agar no need for further  colonoscopy given age  . PAF/PAFlutter     a. 06/2014->amio and coumadin started.  . Elevated LFTs     a. 06/2014 Abd U/S: no GB pathology.  . Chronic diastolic CHF (congestive heart failure)     a. 07/2014 Echo: EF 60-65%, Gr 1 DD, mild to mod TR, PASP 3718m.    Past Surgical History  Procedure Laterality Date  . Abdominal hysterectomy      ovaries intact  . Shoulder surgery      right    Family History  Problem Relation Age of Onset  . Heart disease Mother     CAD  . Heart disease Father     CAD    . Heart disease Brother     CAD   Social History:  reports that she has quit smoking. She has never used smokeless tobacco. She reports that she does not drink alcohol or use illicit drugs.  Allergies:  Allergies  Allergen Reactions  . Alendronate Sodium   . Isosorbide Other (See Comments)  . Metformin And Related     Decreases appetite resulting in weight loss  . Metronidazole     REACTION: sick/ GI  . Nifedipine Other (See Comments)     (Not in a hospital admission)  Results for orders placed or performed during the hospital encounter of 08/07/14 (from the past 48 hour(s))  Basic metabolic panel     Status: Abnormal   Collection Time: 08/07/14 11:40 AM  Result Value Ref Range   Sodium 133 (L) 137 - 147 mEq/L   Potassium 5.0 3.7 - 5.3 mEq/L    Comment: HEMOLYSIS AT THIS LEVEL MAY AFFECT RESULT   Chloride 95 (L) 96 - 112 mEq/L   CO2 19 19 - 32 mEq/L   Glucose, Bld 295 (H) 70 - 99 mg/dL   BUN 22 6 - 23 mg/dL   Creatinine, Ser 0.73 0.50 - 1.10 mg/dL   Calcium 8.9 8.4 - 10.5 mg/dL   GFR calc non Af Amer 76 (L) >90 mL/min   GFR calc Af Amer 88 (L) >90 mL/min    Comment: (NOTE) The eGFR has been calculated using the CKD EPI equation. This calculation has not been validated in all clinical situations. eGFR's persistently <90 mL/min signify possible Chronic Kidney Disease.    Anion gap 19 (H) 5 - 15  BNP (order ONLY if patient complains of dyspnea/SOB AND you have documented it for THIS visit)     Status: Abnormal   Collection Time: 08/07/14 11:40 AM  Result Value Ref Range   Pro B Natriuretic peptide (BNP) 1965.0 (H) 0 - 450 pg/mL  CBC     Status: Abnormal   Collection Time: 08/07/14 11:40 AM  Result Value Ref Range   WBC 14.1 (H) 4.0 - 10.5 K/uL   RBC 4.26 3.87 - 5.11 MIL/uL   Hemoglobin 12.2 12.0 - 15.0 g/dL   HCT 37.4 36.0 - 46.0 %   MCV 87.8 78.0 - 100.0 fL   MCH 28.6 26.0 - 34.0 pg   MCHC 32.6 30.0 - 36.0 g/dL   RDW 15.8 (H) 11.5 - 15.5 %   Platelets 608 (H)  150 - 400 K/uL  I-stat troponin, ED (not at [facility])     Status: None   Collection Time: 08/07/14 12:23 PM  Result Value Ref Range   Troponin i, poc 0.02 0.00 - 0.08 ng/mL   Comment 3            Comment: Due to the release kinetics of cTnI, a negative result within the first hours of the onset of symptoms does not rule out myocardial infarction with certainty. If myocardial infarction is still suspected, repeat the test at appropriate intervals.    Dg Chest Port 1 View  08/07/2014   CLINICAL DATA:  80 year old female with a history of chest tightness and weakness. Hypertension. Diabetes.  EXAM: PORTABLE CHEST - 1 VIEW  COMPARISON:  Chest CT 07/18/2014, chest x-ray 07/18/2014  FINDINGS: The cardiac diameter projects slightly increased from the comparison chest x-ray dated 07/18/2014.  Cardiac border is partially obscured by bilateral lung/pleural disease.  Atherosclerotic calcifications of the aortic arch.  Bibasilar opacities, partially obscuring hemidiaphragms on the heart borders.  No pneumothorax.  Osteopenia.  No displaced fracture.  IMPRESSION: Interval development of bilateral pleural effusions and associated atelectasis and/ or consolidation. No evidence of overt edema.  Cardiac diameter appears slightly greater than the comparison chest x-ray. This may be projectional, secondary to reduced lung volumes, however, persisting or increasing pericardial fluid/effusion cannot be excluded. Further evaluation with cardiac ECHO may be useful.  Atherosclerosis.  Signed,  Dulcy Fanny. [name], DO  [facility]  [facility]   Electronically Signed   By: Corrie Mckusick D.O.   On: 08/07/2014 11:34    Review of Systems  Constitutional: Positive for diaphoresis. Negative for fever.  HENT:  Negative for congestion and sore throat.   Respiratory: Positive for cough and shortness of breath. Negative for sputum production and wheezing.   Cardiovascular: Positive for  orthopnea, leg swelling and PND. Negative for chest pain.  Gastrointestinal: Positive for nausea. Negative for vomiting, abdominal pain, blood in stool and melena.  Genitourinary: Negative for dysuria and hematuria.  Musculoskeletal: Negative for myalgias.  Neurological: Negative for dizziness.  All other systems reviewed and are negative.   Blood pressure 131/84, pulse 81, temperature 97.3 F (36.3 C), resp. rate 19, weight 118 lb 5 oz (53.666 kg), SpO2 96 %. Physical Exam  Nursing note and vitals reviewed. Constitutional: She is oriented to person, place, and time. She appears well-developed and well-nourished. No distress.  HENT:  Head: Normocephalic and atraumatic.  Mouth/Throat: No oropharyngeal exudate.  Eyes: EOM are normal. Pupils are equal, round, and reactive to light. No scleral icterus.  Neck: Normal range of motion. Neck supple. JVD present.  Cardiovascular: Normal rate, S1 normal and S2 normal.  An irregular rhythm present.  No murmur heard. Pulses:      Radial pulses are 2+ on the right side, and 2+ on the left side.  No carotid Bruit  Respiratory: Effort normal. She has rales.  Decreased BS R>L,  Mild rales.  No wheezing  GI: Soft. Bowel sounds are normal. She exhibits no distension. There is no tenderness.  Musculoskeletal: She exhibits edema.  2+ pitting, LEE   Lymphadenopathy:    She has no cervical adenopathy.  Neurological: She is alert and oriented to person, place, and time. She exhibits normal muscle tone.  Skin: Skin is warm and dry.  Psychiatric: She has a normal mood and affect.     Assessment/Plan    Acute on chronic diastolic CHF (congestive heart failure), NYHA class 2 The patient will be admitted for IV diuresis.  She was given 74m in the ER thus far and took the 40 PO at home this morning.  Her afib on top of diastolic dysfunction can certainly be making the CHF worse.   She is 5# higher than her lowest charted weight which was Nov 1.   Continue  Lasix 412mBID.  Will give another dose later this evening.  Follow K+ which is 5.0 now.      CAD (coronary artery disease) Catheterization on November 4 revealing severe diagonal, circumflex OM1, and RCA disease.  Medical mgt with Plavix only since on coumadin. No angina.      Atrial fibrillation Rate well controlled. Continue Cardizem, amio and lopressor.  Hold coumadin and plavix for supratherpeutic INR.  Will ask pharmacy to follow.      HYPERCHOLESTEROLEMIA  No statin due to recent transaminitis.      Essential hypertension  BP controlled.     DM2 (diabetes mellitus, type 2)  SS insulin and CBG ACHS   Nausea without vomiting  Elevated WBCs.  Checking UA.   Thrombocytosis  Acute rise in platelets 295 to 608.  From infection?  Continue to monitor,    Leukocytosis  Etiology is not clear.  No PNA on CXR.  No fever, dysuria but have nausea and chills  Will send UA   HAGER, BRYAN, PA-C 08/07/2014, 3:29 PM Patient seen and examined. I agree with the assessment and plan as detailed above. See also my additional thoughts below.   Initially the plan for the patient was to undergo cardioversion on November 19. By report she was in sinus rhythm. Today she has return of her  atrial arrhythmia. Along with this her volume status has decompensated. The plan will be to continue the load of amiodarone. It does not seem prudent to consider cardioversion at this time. It appears that she is going in and out of sinus rhythm over the past several days. Possibly her rhythm will stabilize further as her amiodarone load is completed and her volume status stabilized. There may be other problems at this time. Her platelets are up and her white count is up. The source is not clear. The overall plan will be to watch her rhythm, continue amiodarone load, and follow her nausea and elevated white count and labs for further assessment.  Dola Argyle, MD, [facility] 08/07/2014 4:50 PM

## 2014-08-07 NOTE — Telephone Encounter (Signed)
Pt back in hospital family will let us know

## 2014-08-07 NOTE — ED Notes (Signed)
Pt sts SOB since this am. Hx of CHF. Denies any chest pain. Pt edema in BLE.

## 2014-08-07 NOTE — ED Notes (Signed)
Cardiology PA reports pt can eat. Low sodium tray ordered for patient.

## 2014-08-07 NOTE — Telephone Encounter (Signed)
New Msg     Patient daughter calling wants to be contacted because patient is having some shortness of breath, feels as if she may pass out. BP is 100/69 HR 89  Please contact at [phone]

## 2014-08-08 LAB — GLUCOSE, CAPILLARY
GLUCOSE-CAPILLARY: 148 mg/dL — AB (ref 70–99)
GLUCOSE-CAPILLARY: 221 mg/dL — AB (ref 70–99)
GLUCOSE-CAPILLARY: 234 mg/dL — AB (ref 70–99)
Glucose-Capillary: 219 mg/dL — ABNORMAL HIGH (ref 70–99)

## 2014-08-08 LAB — BASIC METABOLIC PANEL
Anion gap: 17 — ABNORMAL HIGH (ref 5–15)
BUN: 22 mg/dL (ref 6–23)
CHLORIDE: 100 meq/L (ref 96–112)
CO2: 23 mEq/L (ref 19–32)
Calcium: 8.2 mg/dL — ABNORMAL LOW (ref 8.4–10.5)
Creatinine, Ser: 0.87 mg/dL (ref 0.50–1.10)
GFR calc Af Amer: 68 mL/min — ABNORMAL LOW (ref >90)
GFR calc non Af Amer: 59 mL/min — ABNORMAL LOW (ref >90)
GLUCOSE: 179 mg/dL — AB (ref 70–99)
POTASSIUM: 3.5 meq/L — AB (ref 3.7–5.3)
Sodium: 140 mEq/L (ref 137–147)

## 2014-08-08 LAB — CBC
HEMATOCRIT: 34.8 % — AB (ref 36.0–46.0)
HEMOGLOBIN: 11.2 g/dL — AB (ref 12.0–15.0)
MCH: 28.6 pg (ref 26.0–34.0)
MCHC: 32.2 g/dL (ref 30.0–36.0)
MCV: 88.8 fL (ref 78.0–100.0)
Platelets: 442 10*3/uL — ABNORMAL HIGH (ref 150–400)
RBC: 3.92 MIL/uL (ref 3.87–5.11)
RDW: 15.5 % (ref 11.5–15.5)
WBC: 8.7 10*3/uL (ref 4.0–10.5)

## 2014-08-08 LAB — URINALYSIS, ROUTINE W REFLEX MICROSCOPIC
Bilirubin Urine: NEGATIVE
Glucose, UA: NEGATIVE mg/dL
HGB URINE DIPSTICK: NEGATIVE
Ketones, ur: NEGATIVE mg/dL
Nitrite: NEGATIVE
PROTEIN: NEGATIVE mg/dL
SPECIFIC GRAVITY, URINE: 1.017 (ref 1.005–1.030)
UROBILINOGEN UA: 1 mg/dL (ref 0.0–1.0)
pH: 5 (ref 5.0–8.0)

## 2014-08-08 LAB — PROTIME-INR
INR: 2.73 — ABNORMAL HIGH (ref 0.00–1.49)
Prothrombin Time: 29.1 seconds — ABNORMAL HIGH (ref 11.6–15.2)

## 2014-08-08 LAB — URINE MICROSCOPIC-ADD ON

## 2014-08-08 MED ORDER — WARFARIN - PHARMACIST DOSING INPATIENT: Freq: Every day | Status: DC

## 2014-08-08 MED ORDER — POTASSIUM CHLORIDE CRYS ER 20 MEQ PO TBCR
40.0000 meq | EXTENDED_RELEASE_TABLET | Freq: Once | ORAL | Status: AC
  Administered 2014-08-08: 40 meq via ORAL
  Filled 2014-08-08: qty 2

## 2014-08-08 MED ORDER — DILTIAZEM HCL ER COATED BEADS 240 MG PO CP24
240.0000 mg | ORAL_CAPSULE | Freq: Every day | ORAL | Status: DC
  Administered 2014-08-09: 240 mg via ORAL
  Filled 2014-08-08: qty 1

## 2014-08-08 MED ORDER — WARFARIN SODIUM 3 MG PO TABS
3.0000 mg | ORAL_TABLET | Freq: Once | ORAL | Status: AC
  Administered 2014-08-08: 3 mg via ORAL
  Filled 2014-08-08: qty 1

## 2014-08-08 MED ORDER — CLOPIDOGREL BISULFATE 75 MG PO TABS
75.0000 mg | ORAL_TABLET | Freq: Every day | ORAL | Status: DC
  Administered 2014-08-08 – 2014-08-11 (×4): 75 mg via ORAL
  Filled 2014-08-08 (×4): qty 1

## 2014-08-08 NOTE — Progress Notes (Signed)
ANTICOAGULATION CONSULT NOTE - Initial Consult  Pharmacy Consult for Warfarin Indication: atrial fibrillation  Allergies  Allergen Reactions  . Alendronate Sodium   . Isosorbide Other (See Comments)  . Metformin And Related     Decreases appetite resulting in weight loss  . Metronidazole     REACTION: sick/ GI  . Nifedipine Other (See Comments)    Patient Measurements: Height: 5\' 2"  (157.5 cm) Weight: 119 lb (53.978 kg) (scale a) IBW/kg (Calculated) : 50.1  Vital Signs: Temp: 97.5 F (36.4 C) (11/21 1431) Temp Source: Oral (11/21 1431) BP: 103/63 mmHg (11/21 1431) Pulse Rate: 56 (11/21 1431)  Labs:  Recent Labs  08/06/14 1242 08/07/14 1140 08/07/14 1748 08/08/14 0440  HGB  --  12.2  --  11.2*  HCT  --  37.4  --  34.8*  PLT  --  608*  --  442*  LABPROT  --   --  30.3* 29.1*  INR 4.6  --  2.86* 2.73*  CREATININE  --  0.73  --  0.87    Estimated Creatinine Clearance: 37.4 mL/min (by C-G formula based on Cr of 0.87).   Medical History: Past Medical History  Diagnosis Date  . Osteoporosis   . Hyperlipidemia   . Hypertension   . Hypercalcemia   . CAD (coronary artery disease)     a. 1989 s/p MI, TPA, PTCA;  b. 06/2014 NSTEMI/Cath: LM nl, LAD 30-40, D2 90, small, LCX 128m, OM1 90, RCA Cs2+ 70-80p, 50-56m, 90d (? ruptured plaque), EF 55-60%-->Med Rx.  Marland Kitchen Zoster     past  . Diverticulosis   . Hx of adenomatous colonic polyps 2010    last colonscopy 2010,Dr Vira Agar - no need for further  colonoscopy given age  . PAF/PAFlutter     a. 06/2014->amio and coumadin started.  . Elevated LFTs     a. 06/2014 Abd U/S: no GB pathology.  . Chronic diastolic CHF (congestive heart failure)     a. 07/2014 Echo: EF 60-65%, Gr 1 DD, mild to mod TR, PASP 69mmHg.  . Myocardial infarction 1989  . Type II diabetes mellitus   . GERD (gastroesophageal reflux disease)   . Arthritis     "hips" (08/07/2014)    Medications:  Prescriptions prior to admission  Medication Sig  Dispense Refill Last Dose  . amiodarone (PACERONE) 200 MG tablet Take 1 tablet (200 mg total) by mouth 2 (two) times daily. 60 tablet 11 08/07/2014 at Unknown time  . clopidogrel (PLAVIX) 75 MG tablet Take 1 tablet (75 mg total) by mouth daily with breakfast. 30 tablet 11 08/07/2014 at Unknown time  . Cyanocobalamin (VITAMIN B-12 PO) Take 1 tablet by mouth daily.   Past Month at Unknown time  . diltiazem (CARTIA XT) 180 MG 24 hr capsule Take 360 mg by mouth daily.    08/07/2014 at Unknown time  . docusate sodium 100 MG CAPS Take 100 mg by mouth 2 (two) times daily. 10 capsule 0 08/07/2014 at Unknown time  . esomeprazole (NEXIUM) 40 MG capsule Take 40 mg by mouth daily at 12 noon.   08/06/2014 at Unknown time  . furosemide (LASIX) 40 MG tablet Take 1 tablet (40 mg total) by mouth daily. 30 tablet 6 08/07/2014 at Unknown time  . glipiZIDE (GLUCOTROL XL) 10 MG 24 hr tablet Take 10 mg by mouth daily with breakfast.   08/06/2014 at Unknown time  . lisinopril (PRINIVIL,ZESTRIL) 20 MG tablet Take 1 tablet (20 mg total) by mouth daily. 30 tablet  6 08/06/2014 at Unknown time  . metoprolol (LOPRESSOR) 50 MG tablet Take 1 tablet (50 mg total) by mouth 2 (two) times daily. 60 tablet 5 08/07/2014 at 0700  . nitroGLYCERIN (NITROSTAT) 0.4 MG SL tablet Place 1 tablet (0.4 mg total) under the tongue every 5 (five) minutes as needed. For chest pain and if not improved seek medical attention in ER. 15 tablet 0 rescue  . sitaGLIPtin (JANUVIA) 100 MG tablet Take 100 mg by mouth daily.   08/07/2014 at Unknown time  . glucose blood (ONE TOUCH ULTRA TEST) test strip USE ONE STRIP TO CHECK BLOOD SUGAR ONCE DAILY AND PRN FOR DM 250.0 100 each 3 Taking  . ONETOUCH DELICA LANCETS 21Y MISC Use as instructed to check blood sugar once daily or as needed.  250.00 100 each 3 Taking  . warfarin (COUMADIN) 3 MG tablet Take 1 tablet (3 mg total) by mouth daily at 6 PM. Or as directed 45 tablet 11 08/05/2014   Scheduled:  . amiodarone   200 mg Oral BID  . clopidogrel  75 mg Oral Daily  . [START ON 08/09/2014] diltiazem  240 mg Oral Daily  . docusate sodium  100 mg Oral BID  . furosemide  40 mg Intravenous BID  . insulin aspart  0-9 Units Subcutaneous TID WC  . lisinopril  20 mg Oral Daily  . metoprolol  50 mg Oral BID  . pantoprazole  40 mg Oral Daily  . potassium chloride  40 mEq Oral Once  . sodium chloride  3 mL Intravenous Q12H    Assessment: 80yo female presented to ED with SOB. Pharmacy was consulted to dose warfarin for Afib. Pt's INR is 2.73, H/H 11.2/34.8, Plt 442.  Patient takes warfarin 3mg  PO daily.  Goal of Therapy:  INR 2-3 Monitor platelets by anticoagulation protocol: Yes   Plan:  Warfarin 3mg  PO tonight x1 Daily INR Continue to monitor H&H and platelets and s/sx of bleeding  Andrey Cota. Diona Foley, PharmD Clinical Pharmacist Pager [phone] 08/08/2014,3:48 PM

## 2014-08-08 NOTE — Progress Notes (Signed)
    Subjective:  Denies CP; dyspnea improved   Objective:  Filed Vitals:   08/07/14 1730 08/07/14 1951 08/08/14 0138 08/08/14 0540  BP: 125/74 115/69 127/58 129/70  Pulse: 89 82 57 60  Temp: 98.2 F (36.8 C) 98.1 F (36.7 C)  98 F (36.7 C)  TempSrc: Oral Oral  Oral  Resp: 18 18 18 18   Height: 5\' 2"  (1.575 m)     Weight: 117 lb 4.6 oz (53.2 kg)   119 lb (53.978 kg)  SpO2: 98% 97% 98% 98%    Intake/Output from previous day:  Intake/Output Summary (Last 24 hours) at 08/08/14 1217 Last data filed at 08/08/14 0908  Gross per 24 hour  Intake    360 ml  Output   1000 ml  Net   -640 ml    Physical Exam: Physical exam: Well-developed well-nourished in no acute distress.  Skin is warm and dry.  HEENT is normal.  Neck is supple.  Chest with diminished BS bases Cardiovascular exam is regular rate and rhythm.  Abdominal exam nontender or distended. No masses palpated. Extremities show 1-2+ edema. neuro grossly intact    Lab Results: Basic Metabolic Panel:  Recent Labs  08/07/14 1140 08/08/14 0440  NA 133* 140  K 5.0 3.5*  CL 95* 100  CO2 19 23  GLUCOSE 295* 179*  BUN 22 22  CREATININE 0.73 0.87  CALCIUM 8.9 8.2*   CBC:  Recent Labs  08/07/14 1140 08/08/14 0440  WBC 14.1* 8.7  HGB 12.2 11.2*  HCT 37.4 34.8*  MCV 87.8 88.8  PLT 608* 442*     Assessment/Plan:  1 paroxysmal atrial fibrillation-the patient has converted back to sinus rhythm. Heart rate is mildly decreased. Change Cardizem to 240 mg daily. Continue metoprolol and amiodarone load. Continue Coumadin. Atrial fibrillation is most likely contributing to congestive heart failure. 2 acute on chronic diastolic congestive heart failure-patient feels better today but remains volume overloaded. Continue Lasix 40 mg IV twice a day and follow renal function. 3 hypokalemia-supplement potassium. 4 coronary artery disease-continue Plavix. 5 hypertension-continue present blood pressure medications. 6  leukocytosis/thrombocytosis-improved  Bianca Mueller 08/08/2014, 12:17 PM

## 2014-08-09 LAB — BASIC METABOLIC PANEL
Anion gap: 19 — ABNORMAL HIGH (ref 5–15)
BUN: 25 mg/dL — AB (ref 6–23)
CHLORIDE: 100 meq/L (ref 96–112)
CO2: 19 mEq/L (ref 19–32)
Calcium: 8.6 mg/dL (ref 8.4–10.5)
Creatinine, Ser: 0.79 mg/dL (ref 0.50–1.10)
GFR calc non Af Amer: 73 mL/min — ABNORMAL LOW (ref >90)
GFR, EST AFRICAN AMERICAN: 85 mL/min — AB (ref >90)
Glucose, Bld: 139 mg/dL — ABNORMAL HIGH (ref 70–99)
POTASSIUM: 4.1 meq/L (ref 3.7–5.3)
SODIUM: 138 meq/L (ref 137–147)

## 2014-08-09 LAB — GLUCOSE, CAPILLARY
GLUCOSE-CAPILLARY: 163 mg/dL — AB (ref 70–99)
Glucose-Capillary: 157 mg/dL — ABNORMAL HIGH (ref 70–99)
Glucose-Capillary: 242 mg/dL — ABNORMAL HIGH (ref 70–99)
Glucose-Capillary: 201 mg/dL — ABNORMAL HIGH (ref 70–99)

## 2014-08-09 LAB — CBC
HCT: 36 % (ref 36.0–46.0)
Hemoglobin: 11.5 g/dL — ABNORMAL LOW (ref 12.0–15.0)
MCH: 27.9 pg (ref 26.0–34.0)
MCHC: 31.9 g/dL (ref 30.0–36.0)
MCV: 87.4 fL (ref 78.0–100.0)
PLATELETS: 413 10*3/uL — AB (ref 150–400)
RBC: 4.12 MIL/uL (ref 3.87–5.11)
RDW: 15.6 % — ABNORMAL HIGH (ref 11.5–15.5)
WBC: 8.9 10*3/uL (ref 4.0–10.5)

## 2014-08-09 LAB — PROTIME-INR
INR: 2.63 — AB (ref 0.00–1.49)
PROTHROMBIN TIME: 28.3 s — AB (ref 11.6–15.2)

## 2014-08-09 MED ORDER — WARFARIN SODIUM 3 MG PO TABS
3.0000 mg | ORAL_TABLET | Freq: Once | ORAL | Status: AC
  Administered 2014-08-09: 3 mg via ORAL
  Filled 2014-08-09: qty 1

## 2014-08-09 NOTE — Progress Notes (Signed)
ANTICOAGULATION CONSULT NOTE - Initial Consult  Pharmacy Consult for Warfarin Indication: atrial fibrillation  Allergies  Allergen Reactions  . Alendronate Sodium   . Isosorbide Other (See Comments)  . Metformin And Related     Decreases appetite resulting in weight loss  . Metronidazole     REACTION: sick/ GI  . Nifedipine Other (See Comments)    Patient Measurements: Height: 5\' 2"  (157.5 cm) Weight: 116 lb 13.5 oz (53 kg) IBW/kg (Calculated) : 50.1  Vital Signs: Temp: 97.4 F (36.3 C) (11/22 0601) Temp Source: Oral (11/22 0601) BP: 134/65 mmHg (11/22 0601) Pulse Rate: 60 (11/22 0601)  Labs:  Recent Labs  08/07/14 1140 08/07/14 1748 08/08/14 0440 08/09/14 0438  HGB 12.2  --  11.2* 11.5*  HCT 37.4  --  34.8* 36.0  PLT 608*  --  442* 413*  LABPROT  --  30.3* 29.1* 28.3*  INR  --  2.86* 2.73* 2.63*  CREATININE 0.73  --  0.87 0.79    Estimated Creatinine Clearance: 39.9 mL/min (by C-G formula based on Cr of 0.79).   Medical History: Past Medical History  Diagnosis Date  . Osteoporosis   . Hyperlipidemia   . Hypertension   . Hypercalcemia   . CAD (coronary artery disease)     a. 1989 s/p MI, TPA, PTCA;  b. 06/2014 NSTEMI/Cath: LM nl, LAD 30-40, D2 90, small, LCX 149m, OM1 90, RCA Cs2+ 70-80p, 50-17m, 90d (? ruptured plaque), EF 55-60%-->Med Rx.  Marland Kitchen Zoster     past  . Diverticulosis   . Hx of adenomatous colonic polyps 2010    last colonscopy 2010,Dr Vira Agar - no need for further  colonoscopy given age  . PAF/PAFlutter     a. 06/2014->amio and coumadin started.  . Elevated LFTs     a. 06/2014 Abd U/S: no GB pathology.  . Chronic diastolic CHF (congestive heart failure)     a. 07/2014 Echo: EF 60-65%, Gr 1 DD, mild to mod TR, PASP 83mmHg.  . Myocardial infarction 1989  . Type II diabetes mellitus   . GERD (gastroesophageal reflux disease)   . Arthritis     "hips" (08/07/2014)    Medications:  Prescriptions prior to admission  Medication Sig  Dispense Refill Last Dose  . amiodarone (PACERONE) 200 MG tablet Take 1 tablet (200 mg total) by mouth 2 (two) times daily. 60 tablet 11 08/07/2014 at Unknown time  . clopidogrel (PLAVIX) 75 MG tablet Take 1 tablet (75 mg total) by mouth daily with breakfast. 30 tablet 11 08/07/2014 at Unknown time  . Cyanocobalamin (VITAMIN B-12 PO) Take 1 tablet by mouth daily.   Past Month at Unknown time  . diltiazem (CARTIA XT) 180 MG 24 hr capsule Take 360 mg by mouth daily.    08/07/2014 at Unknown time  . docusate sodium 100 MG CAPS Take 100 mg by mouth 2 (two) times daily. 10 capsule 0 08/07/2014 at Unknown time  . esomeprazole (NEXIUM) 40 MG capsule Take 40 mg by mouth daily at 12 noon.   08/06/2014 at Unknown time  . furosemide (LASIX) 40 MG tablet Take 1 tablet (40 mg total) by mouth daily. 30 tablet 6 08/07/2014 at Unknown time  . glipiZIDE (GLUCOTROL XL) 10 MG 24 hr tablet Take 10 mg by mouth daily with breakfast.   08/06/2014 at Unknown time  . lisinopril (PRINIVIL,ZESTRIL) 20 MG tablet Take 1 tablet (20 mg total) by mouth daily. 30 tablet 6 08/06/2014 at Unknown time  . metoprolol (LOPRESSOR) 50  MG tablet Take 1 tablet (50 mg total) by mouth 2 (two) times daily. 60 tablet 5 08/07/2014 at 0700  . nitroGLYCERIN (NITROSTAT) 0.4 MG SL tablet Place 1 tablet (0.4 mg total) under the tongue every 5 (five) minutes as needed. For chest pain and if not improved seek medical attention in ER. 15 tablet 0 rescue  . sitaGLIPtin (JANUVIA) 100 MG tablet Take 100 mg by mouth daily.   08/07/2014 at Unknown time  . glucose blood (ONE TOUCH ULTRA TEST) test strip USE ONE STRIP TO CHECK BLOOD SUGAR ONCE DAILY AND PRN FOR DM 250.0 100 each 3 Taking  . ONETOUCH DELICA LANCETS 01U MISC Use as instructed to check blood sugar once daily or as needed.  250.00 100 each 3 Taking  . warfarin (COUMADIN) 3 MG tablet Take 1 tablet (3 mg total) by mouth daily at 6 PM. Or as directed 45 tablet 11 08/05/2014   Scheduled:  . amiodarone   200 mg Oral BID  . clopidogrel  75 mg Oral Daily  . docusate sodium  100 mg Oral BID  . furosemide  40 mg Intravenous BID  . insulin aspart  0-9 Units Subcutaneous TID WC  . lisinopril  20 mg Oral Daily  . metoprolol  50 mg Oral BID  . pantoprazole  40 mg Oral Daily  . sodium chloride  3 mL Intravenous Q12H  . Warfarin - Pharmacist Dosing Inpatient   Does not apply q1800    Assessment: 80yo female presented to ED with SOB. Pharmacy was consulted to dose warfarin for Afib. Pt's INR is 2.63, H/H 11.5/36, Plt 413. No bleeding is noted.  Patient takes warfarin 3mg  PO daily.  Goal of Therapy:  INR 2-3 Monitor platelets by anticoagulation protocol: Yes   Plan:  Warfarin 3mg  PO tonight x1 Daily INR Continue to monitor H&H and platelets and s/sx of bleeding  Andrey Cota. Diona Foley, PharmD Clinical Pharmacist Pager [phone] 08/09/2014,2:33 PM

## 2014-08-09 NOTE — Progress Notes (Signed)
    Subjective:  Denies CP; dyspnea improving; pedal edema persists   Objective:  Filed Vitals:   08/08/14 1431 08/08/14 2046 08/09/14 0221 08/09/14 0601  BP: 103/63 119/80 126/59 134/65  Pulse: 56 53 51 60  Temp: 97.5 F (36.4 C) 97.6 F (36.4 C) 97.3 F (36.3 C) 97.4 F (36.3 C)  TempSrc: Oral Axillary Oral Oral  Resp: 16 18 18 18   Height:      Weight:    116 lb 13.5 oz (53 kg)  SpO2: 98% 98% 98% 94%    Intake/Output from previous day:  Intake/Output Summary (Last 24 hours) at 08/09/14 1120 Last data filed at 08/09/14 2111  Gross per 24 hour  Intake    860 ml  Output    450 ml  Net    410 ml    Physical Exam: Physical exam: Well-developed well-nourished in no acute distress.  Skin is warm and dry.  HEENT is normal.  Neck is supple.  Chest with diminished BS bases Cardiovascular exam is regular and bradycardic Abdominal exam nontender or distended. No masses palpated. Extremities show 1+ edema. neuro grossly intact    Lab Results: Basic Metabolic Panel:  Recent Labs  08/08/14 0440 08/09/14 0438  NA 140 138  K 3.5* 4.1  CL 100 100  CO2 23 19  GLUCOSE 179* 139*  BUN 22 25*  CREATININE 0.87 0.79  CALCIUM 8.2* 8.6   CBC:  Recent Labs  08/08/14 0440 08/09/14 0438  WBC 8.7 8.9  HGB 11.2* 11.5*  HCT 34.8* 36.0  MCV 88.8 87.4  PLT 442* 413*     Assessment/Plan:  1 paroxysmal atrial fibrillation-the patient remains in sinus rhythm. Heart rate is mildly decreased. DC cardizem. Continue metoprolol and amiodarone load. Continue Coumadin. Atrial fibrillation is most likely contributing to congestive heart failure. 2 acute on chronic diastolic congestive heart failure-patient remains volume overloaded. Continue Lasix 40 mg IV twice a day and follow renal function. 3 hypokalemia-resolved 4 coronary artery disease-continue Plavix. 5 hypertension-DC cardizem due to bradycardia; if BP increases add norvasc. 6  leukocytosis/thrombocytosis-improved  [name] 08/09/2014, 11:20 AM

## 2014-08-10 DIAGNOSIS — I10 Essential (primary) hypertension: Secondary | ICD-10-CM

## 2014-08-10 DIAGNOSIS — E780 Pure hypercholesterolemia: Secondary | ICD-10-CM

## 2014-08-10 DIAGNOSIS — I481 Persistent atrial fibrillation: Secondary | ICD-10-CM

## 2014-08-10 LAB — CBC
HEMATOCRIT: 35.7 % — AB (ref 36.0–46.0)
Hemoglobin: 11.1 g/dL — ABNORMAL LOW (ref 12.0–15.0)
MCH: 27.2 pg (ref 26.0–34.0)
MCHC: 31.1 g/dL (ref 30.0–36.0)
MCV: 87.5 fL (ref 78.0–100.0)
Platelets: 410 10*3/uL — ABNORMAL HIGH (ref 150–400)
RBC: 4.08 MIL/uL (ref 3.87–5.11)
RDW: 15.7 % — ABNORMAL HIGH (ref 11.5–15.5)
WBC: 9.2 10*3/uL (ref 4.0–10.5)

## 2014-08-10 LAB — GLUCOSE, CAPILLARY
GLUCOSE-CAPILLARY: 197 mg/dL — AB (ref 70–99)
GLUCOSE-CAPILLARY: 227 mg/dL — AB (ref 70–99)
GLUCOSE-CAPILLARY: 227 mg/dL — AB (ref 70–99)
Glucose-Capillary: 197 mg/dL — ABNORMAL HIGH (ref 70–99)

## 2014-08-10 LAB — BASIC METABOLIC PANEL
Anion gap: 15 (ref 5–15)
BUN: 21 mg/dL (ref 6–23)
CO2: 22 meq/L (ref 19–32)
Calcium: 8.6 mg/dL (ref 8.4–10.5)
Chloride: 99 mEq/L (ref 96–112)
Creatinine, Ser: 0.65 mg/dL (ref 0.50–1.10)
GFR calc Af Amer: >90 mL/min (ref >90)
GFR calc non Af Amer: 78 mL/min — ABNORMAL LOW (ref >90)
Glucose, Bld: 174 mg/dL — ABNORMAL HIGH (ref 70–99)
Potassium: 3.6 mEq/L — ABNORMAL LOW (ref 3.7–5.3)
SODIUM: 136 meq/L — AB (ref 137–147)

## 2014-08-10 LAB — PROTIME-INR
INR: 2.57 — ABNORMAL HIGH (ref 0.00–1.49)
Prothrombin Time: 27.8 seconds — ABNORMAL HIGH (ref 11.6–15.2)

## 2014-08-10 MED ORDER — WARFARIN SODIUM 1 MG PO TABS
1.5000 mg | ORAL_TABLET | Freq: Once | ORAL | Status: DC
  Filled 2014-08-10 (×2): qty 1

## 2014-08-10 MED ORDER — POTASSIUM CHLORIDE CRYS ER 20 MEQ PO TBCR
40.0000 meq | EXTENDED_RELEASE_TABLET | Freq: Every day | ORAL | Status: DC
  Administered 2014-08-10 – 2014-08-11 (×2): 40 meq via ORAL
  Filled 2014-08-10 (×3): qty 2

## 2014-08-10 MED ORDER — NEPRO/CARBSTEADY PO LIQD
237.0000 mL | Freq: Two times a day (BID) | ORAL | Status: DC
  Administered 2014-08-10 – 2014-08-11 (×3): 237 mL via ORAL
  Filled 2014-08-10 (×7): qty 237

## 2014-08-10 NOTE — Progress Notes (Signed)
ANTICOAGULATION CONSULT NOTE - Follow Up Consult  Pharmacy Consult for warfarin Indication: atrial fibrillation  Allergies  Allergen Reactions  . Alendronate Sodium   . Isosorbide Other (See Comments)  . Metformin And Related     Decreases appetite resulting in weight loss  . Metronidazole     REACTION: sick/ GI  . Nifedipine Other (See Comments)    Patient Measurements: Height: 5\' 2"  (157.5 cm) Weight: 115 lb 4.8 oz (52.3 kg) IBW/kg (Calculated) : 50.1  Vital Signs: Temp: 97.4 F (36.3 C) (11/23 0949) Temp Source: Oral (11/23 0949) BP: 128/80 mmHg (11/23 0949) Pulse Rate: 89 (11/23 0949)  Labs:  Recent Labs  08/08/14 0440 08/09/14 0438 08/10/14 0356  HGB 11.2* 11.5* 11.1*  HCT 34.8* 36.0 35.7*  PLT 442* 413* 410*  LABPROT 29.1* 28.3* 27.8*  INR 2.73* 2.63* 2.57*  CREATININE 0.87 0.79 0.65    Estimated Creatinine Clearance: 39.9 mL/min (by C-G formula based on Cr of 0.65).  Assessment: 80 y/o female who presented to the ED with SOB. She takes chronic warfarin for Afib. INR is 2.57 today. No bleeding noted, CBC is stable.  PTA: 3 mg daily; outpatient clinic note states 3 mg daily except 1.5 mg on MF  Goal of Therapy:  INR 2-3 Monitor platelets by anticoagulation protocol: Yes   Plan:  - Warfarin 1.5 mg PO tonight - INR daily - Monitor for s/sx of bleeding  [facility], Pharm.D., BCPS Clinical Pharmacist Pager: [phone] 08/10/2014 10:33 AM

## 2014-08-10 NOTE — Progress Notes (Signed)
Patient needs another IV. Her Iv has infiltrated. Looked to see if patient has veins. Patient stated that she is a very hard stick. IV team paged

## 2014-08-10 NOTE — Progress Notes (Signed)
Patient Name: Bianca Mueller Date of Encounter: 08/10/2014  Principal Problem:   Acute on chronic diastolic CHF (congestive heart failure), NYHA class 2 Active Problems:   HYPERCHOLESTEROLEMIA   Essential hypertension   DM2 (diabetes mellitus, type 2)   Nausea without vomiting   Atrial fibrillation   CAD (coronary artery disease)   Leukocytosis   Acute on chronic diastolic heart failure   Primary Cardiologist: Dr. Tamala Julian  Patient Profile: 80 yo female w/ hx of T2DM, HTN, HLD, CAD (anterior MI treated w/TPA in 1989 and subsequent angioplasty with EF of 38% at [facility]), NSTEMI w/ med rx for CAD (d/c 11/11), admitted 11/20 with CHF.   SUBJECTIVE: Weak, breathing better, no chest pain.   OBJECTIVE Filed Vitals:   08/09/14 1518 08/09/14 2009 08/10/14 0559 08/10/14 0949  BP: 112/60 139/61 109/80 128/80  Pulse: 72 79 89 89  Temp: 97.8 F (36.6 C) 97.3 F (36.3 C) 98.2 F (36.8 C) 97.4 F (36.3 C)  TempSrc: Oral Oral Oral Oral  Resp: 18 18 17 18   Height:      Weight:   115 lb 4.8 oz (52.3 kg)   SpO2: 99% 97% 96% 100%    Intake/Output Summary (Last 24 hours) at 08/10/14 1107 Last data filed at 08/10/14 0601  Gross per 24 hour  Intake    280 ml  Output   1350 ml  Net  -1070 ml   Filed Weights   08/08/14 0540 08/09/14 0601 08/10/14 0559  Weight: 119 lb (53.978 kg) 116 lb 13.5 oz (53 kg) 115 lb 4.8 oz (52.3 kg)    PHYSICAL EXAM General: Well developed, well nourished, female in no acute distress. Head: Normocephalic, atraumatic.  Neck: Supple without bruits, JVD minimal elevation. Lungs:  Resp regular and unlabored, decreased BS bilateral bases w/ rales. Heart: Irreg R&R, S1, S2, no S3, ? S4, short systolic murmur; no rub. Abdomen: Soft, non-tender, non-distended, BS + x 4.  Extremities: No clubbing, cyanosis, 1-2 + edema.  Neuro: Alert and oriented X 3. Moves all extremities spontaneously. Psych: Normal affect.  LABS: CBC: Recent Labs  08/09/14 0438  08/10/14 0356  WBC 8.9 9.2  HGB 11.5* 11.1*  HCT 36.0 35.7*  MCV 87.4 87.5  PLT 413* 410*   INR: Recent Labs  08/10/14 0356  INR 1.77*   Basic Metabolic Panel: Recent Labs  08/09/14 0438 08/10/14 0356  NA 138 136*  K 4.1 3.6*  CL 100 99  CO2 19 22  GLUCOSE 139* 174*  BUN 25* 21  CREATININE 0.79 0.65  CALCIUM 8.6 8.6    Recent Labs  08/07/14 1223  TROPIPOC 0.02   BNP: PRO B NATRIURETIC PEPTIDE (BNP)  Date/Time Value Ref Range Status  08/07/2014 11:40 AM 1965.0* 0 - 450 pg/mL Final  07/18/2014 11:17 PM 2238.0* 0 - 450 pg/mL Final   Lab Results  Component Value Date   TSH 2.080 07/18/2014    TELE:  Atrial fib, now was SR  Current Medications:  . amiodarone  200 mg Oral BID  . clopidogrel  75 mg Oral Daily  . docusate sodium  100 mg Oral BID  . furosemide  40 mg Intravenous BID  . insulin aspart  0-9 Units Subcutaneous TID WC  . lisinopril  20 mg Oral Daily  . metoprolol  50 mg Oral BID  . pantoprazole  40 mg Oral Daily  . sodium chloride  3 mL Intravenous Q12H  . warfarin  1.5 mg Oral ONCE-1800  .  Warfarin - Pharmacist Dosing Inpatient   Does not apply q1800      ASSESSMENT AND PLAN: Principal Problem:   Acute on chronic diastolic CHF (congestive heart failure), NYHA class 2 - continue IV Lasix for now, weight down 3-4 lbs since admit. Supplement K+, follow renal function.  Active Problems:   HYPERCHOLESTEROLEMIA - not on statin due to abnl LFTs previous admit. MD advise if we should try low dose RX, LFTs had normalized on 11/17 CMET.    Essential hypertension - good control on current rx    DM2 (diabetes mellitus, type 2) - on SSI    Nausea without vomiting - improved    Atrial fibrillation - Pt went back into afib, rate mostly controlled. Felt contributing to CHF, MD advise on increasing amio.    CAD (coronary artery disease) - no ongoing ischemic symptoms    Leukocytosis - improved    ?Poor nutritional status - pt describes early  satiety, very slender at baseline, will add supplement bid   Signed, Rosaria Ferries , PA-C 11:07 AM 08/10/2014  The patient was seen, examined and discussed with Rosaria Ferries, PA-C and I agree with the above.   80 year old female admitted with acute on chronic diastolic CHF, responded well to iv diuresis, still feels mildly SOB. We will continue 1 more day of iv diuresis, discharge home tomorrow. Crea stable. Telemetry shows rate controlled a-fib.   Dorothy Spark 08/10/2014

## 2014-08-10 NOTE — Discharge Instructions (Signed)

## 2014-08-10 NOTE — Progress Notes (Signed)
Utilization review completed. Roshana Shuffield, RN, BSN. 

## 2014-08-11 ENCOUNTER — Other Ambulatory Visit: Payer: Self-pay | Admitting: Physician Assistant

## 2014-08-11 DIAGNOSIS — I2510 Atherosclerotic heart disease of native coronary artery without angina pectoris: Secondary | ICD-10-CM

## 2014-08-11 DIAGNOSIS — I5033 Acute on chronic diastolic (congestive) heart failure: Secondary | ICD-10-CM

## 2014-08-11 LAB — CBC
HEMATOCRIT: 34.7 % — AB (ref 36.0–46.0)
Hemoglobin: 11 g/dL — ABNORMAL LOW (ref 12.0–15.0)
MCH: 28.4 pg (ref 26.0–34.0)
MCHC: 31.7 g/dL (ref 30.0–36.0)
MCV: 89.7 fL (ref 78.0–100.0)
Platelets: 374 10*3/uL (ref 150–400)
RBC: 3.87 MIL/uL (ref 3.87–5.11)
RDW: 15.9 % — AB (ref 11.5–15.5)
WBC: 8.4 10*3/uL (ref 4.0–10.5)

## 2014-08-11 LAB — BASIC METABOLIC PANEL
Anion gap: 13 (ref 5–15)
BUN: 22 mg/dL (ref 6–23)
CALCIUM: 8.4 mg/dL (ref 8.4–10.5)
CO2: 26 meq/L (ref 19–32)
CREATININE: 0.65 mg/dL (ref 0.50–1.10)
Chloride: 101 mEq/L (ref 96–112)
GFR calc Af Amer: >90 mL/min (ref >90)
GFR calc non Af Amer: 78 mL/min — ABNORMAL LOW (ref >90)
GLUCOSE: 198 mg/dL — AB (ref 70–99)
Potassium: 4.1 mEq/L (ref 3.7–5.3)
Sodium: 140 mEq/L (ref 137–147)

## 2014-08-11 LAB — GLUCOSE, CAPILLARY
GLUCOSE-CAPILLARY: 194 mg/dL — AB (ref 70–99)
Glucose-Capillary: 191 mg/dL — ABNORMAL HIGH (ref 70–99)

## 2014-08-11 LAB — PROTIME-INR
INR: 2.29 — ABNORMAL HIGH (ref 0.00–1.49)
Prothrombin Time: 25.4 seconds — ABNORMAL HIGH (ref 11.6–15.2)

## 2014-08-11 MED ORDER — FUROSEMIDE 40 MG PO TABS: 40.0000 mg | ORAL_TABLET | Freq: Two times a day (BID) | ORAL | Status: DC

## 2014-08-11 MED ORDER — FUROSEMIDE 40 MG PO TABS
40.0000 mg | ORAL_TABLET | Freq: Two times a day (BID) | ORAL | Status: DC
  Filled 2014-08-11 (×2): qty 1

## 2014-08-11 NOTE — Progress Notes (Signed)
Visited patient at bedside to offer [facility] services as benefit of his [employer]. Patient agreeable to services and will receive post hospital discharge call and will be evaluated for monthly home visits. Follow up with RNCM regarding [facility] services and patient signed consent form to start services.   Of note, [facility] Management services does not replace or interfere with any services that are arranged by inpatient case management or social work. For additional questions or referrals please contact, Natividad Brood, RN BSN CCM, [facility]iaison at [phone].

## 2014-08-11 NOTE — Discharge Summary (Signed)
CARDIOLOGY DISCHARGE SUMMARY   Patient ID: Bianca Mueller MRN: [id number] DOB/AGE: 12/09/1925 80 y.o.  Admit date: 08/07/2014 Discharge date: 08/11/2014  PCP: Loura Pardon, MD Primary Cardiologist: Dr. Tamala Julian  Primary Discharge Diagnosis:   Acute on chronic diastolic CHF (congestive heart failure), NYHA class 3 - weight at discharge 113 pounds  Secondary Discharge Diagnosis:    HYPERCHOLESTEROLEMIA   Essential hypertension   DM2 (diabetes mellitus, type 2)   Nausea without vomiting   Atrial fibrillation   CAD (coronary artery disease)   Leukocytosis   Acute on chronic diastolic heart failure  Procedures: Portable chest x-ray  Hospital Course: Bianca Mueller is a 80 y.o. female with a history of of T2DM, HTN, HLD, CAD (anterior MI treated w/TPA in 1989 and subsequent angioplasty with EF of 38% at [facility]), NSTEMI w/ med rx for CAD (d/c 11/11), came to the hospital with shortness of breath on 11/20 and was admitted with CHF.   She was diuresed with IV Lasix. She lost at least 3 L during her hospital stay. Her weight decreased by 5 pounds during her hospital stay. Discharge weight was 113 pounds.  Her renal function and potassium levels were followed closely during her hospital stay and supplemented as needed.  There was concern for poor nutritional status as she described early satiety and was eating very little. She was started on twice a day supplement and is encouraged to use Glucerna twice a day for snack or as a meal replacement.  She tolerated diuresis well from a renal standpoint, discharge labs are below.  Her blood sugars were managed with sliding scale insulin.  She was in atrial fibrillation, but her rate was controlled and so she was continued on her amiodarone at her preadmission dose.  Her Coumadin was followed during her hospital stay and was therapeutic.  On 11/24, she was seen by Dr. Meda Coffee and all data were reviewed. She has a daughter that is a Marine scientist  and a granddaughter that is a Software engineer and the will assist in her care. Her respiratory status was stable and she was ambulating without chest pain or shortness of breath. No further inpatient workup was indicated and she is considered stable for discharge, to follow up as an outpatient.  Labs:   Lab Results  Component Value Date   WBC 8.4 08/11/2014   HGB 11.0* 08/11/2014   HCT 34.7* 08/11/2014   MCV 89.7 08/11/2014   PLT 374 08/11/2014    Recent Labs Lab 08/04/14 1633  08/11/14 0435  NA 135  < > 140  K 4.4  < > 4.1  CL 102  < > 101  CO2 24  < > 26  BUN 24*  < > 22  CREATININE 0.9  < > 0.65  CALCIUM 8.8  < > 8.4  PROT 7.0  --   --   BILITOT 0.4  --   --   ALKPHOS 76  --   --   ALT 43*  --   --   AST 36  --   --   GLUCOSE 263*  < > 198*  < > = values in this interval not displayed.  PRO B NATRIURETIC PEPTIDE (BNP)  Date/Time Value Ref Range Status  08/07/2014 11:40 AM 1965.0* 0 - 450 pg/mL Final  07/18/2014 11:17 PM 2238.0* 0 - 450 pg/mL Final    Recent Labs  08/11/14 0435  INR 2.29*   EKG: 08/07/2014 Atrial fibrillation, rate 86  CXR: 08/07/2014  IMPRESSION: Interval development of bilateral pleural effusions and associated atelectasis and/ or consolidation. No evidence of overt edema. Cardiac diameter appears slightly greater than the comparison chest x-ray. This may be projectional, secondary to reduced lung volumes, however, persisting or increasing pericardial fluid/effusion cannot be excluded. Further evaluation with cardiac ECHO may be useful. Atherosclerosis.  FOLLOW UP PLANS AND APPOINTMENTS Allergies  Allergen Reactions  . Alendronate Sodium   . Isosorbide Other (See Comments)  . Metformin And Related     Decreases appetite resulting in weight loss  . Metronidazole     REACTION: sick/ GI  . Nifedipine Other (See Comments)     Medication List    STOP taking these medications        CARTIA XT 180 MG 24 hr capsule  Generic drug:   diltiazem      TAKE these medications        amiodarone 200 MG tablet  Commonly known as:  PACERONE  Take 1 tablet (200 mg total) by mouth 2 (two) times daily.     clopidogrel 75 MG tablet  Commonly known as:  PLAVIX  Take 1 tablet (75 mg total) by mouth daily with breakfast.     DSS 100 MG Caps  Take 100 mg by mouth 2 (two) times daily.     esomeprazole 40 MG capsule  Commonly known as:  NEXIUM  Take 40 mg by mouth daily at 12 noon.     furosemide 40 MG tablet  Commonly known as:  LASIX  Take 1 tablet (40 mg total) by mouth 2 (two) times daily.     glipiZIDE 10 MG 24 hr tablet  Commonly known as:  GLUCOTROL XL  Take 10 mg by mouth daily with breakfast.     glucose blood test strip  Commonly known as:  ONE TOUCH ULTRA TEST  USE ONE STRIP TO CHECK BLOOD SUGAR ONCE DAILY AND PRN FOR DM 250.0     lisinopril 20 MG tablet  Commonly known as:  PRINIVIL,ZESTRIL  Take 1 tablet (20 mg total) by mouth daily.     metoprolol 50 MG tablet  Commonly known as:  LOPRESSOR  Take 1 tablet (50 mg total) by mouth 2 (two) times daily.     nitroGLYCERIN 0.4 MG SL tablet  Commonly known as:  NITROSTAT  Place 1 tablet (0.4 mg total) under the tongue every 5 (five) minutes as needed. For chest pain and if not improved seek medical attention in ER.     ONETOUCH DELICA LANCETS 58I Misc  Use as instructed to check blood sugar once daily or as needed.  250.00     sitaGLIPtin 100 MG tablet  Commonly known as:  JANUVIA  Take 100 mg by mouth daily.     VITAMIN B-12 PO  Take 1 tablet by mouth daily.     warfarin 3 MG tablet  Commonly known as:  COUMADIN  Take 1 tablet (3 mg total) by mouth daily at 6 PM. Or as directed        Discharge Instructions    (Peter) Call MD:  Anytime you have any of the following symptoms: 1) 3 pound weight gain in 24 hours or 5 pounds in 1 week 2) shortness of breath, with or without a dry hacking cough 3) swelling in the hands, feet or  stomach 4) if you have to sleep on extra pillows at night in order to breathe.    Complete by:  As directed  AMB Referral to [facility]    Complete by:  As directed   Please assign RNCM for the patient to be followed for transition of care needs and for home visits.  Reason for consult:  Acute on chronic HF  Diagnoses of:   Heart FailureDiabetes       Diet - low sodium heart healthy    Complete by:  As directed      Diet Carb Modified    Complete by:  As directed      Increase activity slowly    Complete by:  As directed           Follow-up Information    Follow up with Sinclair Grooms, MD.   Specialty:  Cardiology   Why:  The office will call.   Contact information:   [address] [place] [address] [phone]       BRING ALL MEDICATIONS WITH YOU TO FOLLOW UP APPOINTMENTS  Time spent with patient to include physician time: 48 min Signed: Rosaria Ferries, PA-C 08/11/2014, 2:37 PM Co-Sign MD

## 2014-08-11 NOTE — Progress Notes (Signed)
Patient Name: Bianca Mueller Date of Encounter: 08/11/2014  Principal Problem:   Acute on chronic diastolic CHF (congestive heart failure), NYHA class 3 Active Problems:   HYPERCHOLESTEROLEMIA   Essential hypertension   DM2 (diabetes mellitus, type 2)   Nausea without vomiting   Atrial fibrillation   CAD (coronary artery disease)   Leukocytosis   Acute on chronic diastolic heart failure   Primary Cardiologist: Dr. Tamala Julian  Patient Profile: 80 yo female w/ hx of T2DM, HTN, HLD, CAD (anterior MI treated w/TPA in 1989 and subsequent angioplasty with EF of 38% at [facility]), NSTEMI w/ med rx for CAD (d/c 11/11), admitted 11/20 with CHF.   SUBJECTIVE: Breathing better today, no chest pain. Has good help at home. She does not want to go home and come right back (again).  OBJECTIVE Filed Vitals:   08/10/14 0949 08/10/14 1406 08/10/14 2110 08/11/14 0601  BP: 128/80 116/68 125/70 132/67  Pulse: 89 94 81 74  Temp: 97.4 F (36.3 C) 97.6 F (36.4 C) 97.2 F (36.2 C) 97.9 F (36.6 C)  TempSrc: Oral Oral Oral Oral  Resp: 18 16 18 17   Height:      Weight:    113 lb 5.1 oz (51.4 kg)  SpO2: 100% 98% 98% 98%    Intake/Output Summary (Last 24 hours) at 08/11/14 0754 Last data filed at 08/11/14 0606  Gross per 24 hour  Intake    840 ml  Output   1400 ml  Net   -560 ml   Filed Weights   08/09/14 0601 08/10/14 0559 08/11/14 0601  Weight: 116 lb 13.5 oz (53 kg) 115 lb 4.8 oz (52.3 kg) 113 lb 5.1 oz (51.4 kg)    PHYSICAL EXAM General: Well developed, well nourished, female in no acute distress. Head: Normocephalic, atraumatic.  Neck: Supple without bruits, JVD at 9 cm. Lungs:  Resp regular and unlabored, rales bases. Heart: RRR, S1, S2, no S3, S4, or murmur; no rub. Abdomen: Soft, non-tender, non-distended, BS + x 4.  Extremities: No clubbing, cyanosis, 1+ edema.  Neuro: Alert and oriented X 3. Moves all extremities spontaneously. Psych: Normal affect.  LABS: CBC:  Recent  Labs  08/10/14 0356 08/11/14 0435  WBC 9.2 8.4  HGB 11.1* 11.0*  HCT 35.7* 34.7*  MCV 87.5 89.7  PLT 410* 374   INR:  Recent Labs  08/11/14 0435  INR 0.93*   Basic Metabolic Panel:  Recent Labs  08/10/14 0356 08/11/14 0435  NA 136* 140  K 3.6* 4.1  CL 99 101  CO2 22 26  GLUCOSE 174* 198*  BUN 21 22  CREATININE 0.65 0.65  CALCIUM 8.6 8.4   BNP: PRO B NATRIURETIC PEPTIDE (BNP)  Date/Time Value Ref Range Status  08/07/2014 11:40 AM 1965.0* 0 - 450 pg/mL Final  07/18/2014 11:17 PM 2238.0* 0 - 450 pg/mL Final    TELE:  Converted from afib-->SR this am  Current Medications:  . amiodarone  200 mg Oral BID  . clopidogrel  75 mg Oral Daily  . docusate sodium  100 mg Oral BID  . feeding supplement (NEPRO CARB STEADY)  237 mL Oral BID BM  . furosemide  40 mg Intravenous BID  . insulin aspart  0-9 Units Subcutaneous TID WC  . lisinopril  20 mg Oral Daily  . metoprolol  50 mg Oral BID  . pantoprazole  40 mg Oral Daily  . potassium chloride  40 mEq Oral Daily  . sodium chloride  3  mL Intravenous Q12H  . warfarin  1.5 mg Oral ONCE-1800  . Warfarin - Pharmacist Dosing Inpatient   Does not apply q1800     ASSESSMENT AND PLAN: Principal Problem:  Acute on chronic diastolic CHF (congestive heart failure), NYHA class 3 - continue IV Lasix for now, weight down 5 lbs since admit. Follow renal function.  Active Problems:  HYPERCHOLESTEROLEMIA - not on statin due to abnl LFTs previous admit. MD advise if we should try low dose RX, LFTs had normalized on 11/17 CMET.   Essential hypertension - good control on current rx   DM2 (diabetes mellitus, type 2) - on SSI   Nausea without vomiting - improved   Atrial fibrillation - Pt went back into afib, rate mostly controlled but has since converted to SR. Felt contributing to CHF.   CAD (coronary artery disease) - no ongoing ischemic symptoms, on Plavix, BB   Leukocytosis - improved   ?Poor nutritional status - pt  describes early satiety, very slender at baseline, will add supplement bid  Plan - ambulate, had IV Lasix this am, possible d/c this pm if does well.  Signed, Rosaria Ferries , PA-C 7:54 AM 08/11/2014  The patient was seen, examined and discussed with Rosaria Ferries, PA-C and I agree with the above.   80 year old female admitted with acute on chronic diastolic CHF, responded well to iv diuresis, improved SOB. We will switch to PO lasix 40 mg BID. Crea, lytes stable. Discharge home today. Telemetry shows rate controlled a-fib. Continue metoprolol and amiodarone for rate control with a-fib. Continue warfarin. INR 2.2 today.   Dorothy Spark 08/11/2014

## 2014-08-14 ENCOUNTER — Other Ambulatory Visit: Payer: Commercial Managed Care - HMO

## 2014-08-17 ENCOUNTER — Telehealth: Payer: Self-pay | Admitting: *Deleted

## 2014-08-17 NOTE — Telephone Encounter (Signed)
Patient is scheduled for a CPE on Friday, December 4th with you.  We have not done lab work because of her recent hospitalizations.  A lot of labs were done during that time and I wasn't sure if you felt like you needed more.  If so, perhaps it can be done while she's here for the visit.  She won't be fasting but she has been keeping logs of her BS daily and will bring it with her.  The Cardiologists took her off of her cholesterol medications so does she even need those levels drawn?  Please advise.

## 2014-08-17 NOTE — Telephone Encounter (Signed)
No problem We will do anything we need when she is here -thanks

## 2014-08-18 ENCOUNTER — Telehealth: Payer: Self-pay | Admitting: Cardiovascular Disease

## 2014-08-18 NOTE — Telephone Encounter (Signed)
error 

## 2014-08-20 ENCOUNTER — Telehealth: Payer: Self-pay

## 2014-08-20 ENCOUNTER — Encounter: Payer: Self-pay | Admitting: Cardiovascular Disease

## 2014-08-20 ENCOUNTER — Ambulatory Visit (INDEPENDENT_AMBULATORY_CARE_PROVIDER_SITE_OTHER): Payer: Commercial Managed Care - HMO

## 2014-08-20 ENCOUNTER — Ambulatory Visit (INDEPENDENT_AMBULATORY_CARE_PROVIDER_SITE_OTHER): Payer: Commercial Managed Care - HMO | Admitting: Cardiovascular Disease

## 2014-08-20 ENCOUNTER — Other Ambulatory Visit: Payer: Self-pay | Admitting: Family Medicine

## 2014-08-20 VITALS — BP 140/72 | HR 70 | Ht 62.0 in | Wt 108.8 lb

## 2014-08-20 DIAGNOSIS — Z5181 Encounter for therapeutic drug level monitoring: Secondary | ICD-10-CM

## 2014-08-20 DIAGNOSIS — I4819 Other persistent atrial fibrillation: Secondary | ICD-10-CM

## 2014-08-20 DIAGNOSIS — I4891 Unspecified atrial fibrillation: Secondary | ICD-10-CM

## 2014-08-20 DIAGNOSIS — I481 Persistent atrial fibrillation: Secondary | ICD-10-CM

## 2014-08-20 DIAGNOSIS — I214 Non-ST elevation (NSTEMI) myocardial infarction: Secondary | ICD-10-CM

## 2014-08-20 DIAGNOSIS — R0602 Shortness of breath: Secondary | ICD-10-CM

## 2014-08-20 DIAGNOSIS — I5033 Acute on chronic diastolic (congestive) heart failure: Secondary | ICD-10-CM

## 2014-08-20 DIAGNOSIS — E119 Type 2 diabetes mellitus without complications: Secondary | ICD-10-CM

## 2014-08-20 DIAGNOSIS — R627 Adult failure to thrive: Secondary | ICD-10-CM

## 2014-08-20 DIAGNOSIS — I4892 Unspecified atrial flutter: Secondary | ICD-10-CM

## 2014-08-20 DIAGNOSIS — I10 Essential (primary) hypertension: Secondary | ICD-10-CM

## 2014-08-20 LAB — POCT INR: INR: 4.8

## 2014-08-20 MED ORDER — PRAVASTATIN SODIUM 20 MG PO TABS: 20.0000 mg | ORAL_TABLET | Freq: Every day | ORAL | Status: DC

## 2014-08-20 NOTE — Telephone Encounter (Signed)
Spoke w/ pt.  Advised her of new coumadin instructions: Do not take coumadin x 2 days, then take 1/2 tab (to equal 1.5 mg) daily, except a whole tab on Sun, Tues & Thurs. Pt is sched to come in 08/26/14 @ 8:50 for coumadin check. Pt verbalizes understanding and repeats instructions back to me.

## 2014-08-20 NOTE — Progress Notes (Signed)
Patient ID: Bianca Mueller, female    DOB: 23-Sep-1925, 80 y.o.   MRN: [id number]  HPI Comments: 80 year old female with history of non-ST elevation MI complicated by paroxysmal atrial fibrillation and flutter who presents for follow-up after recent hospitalization Recent atrial fibrillation/flutter with RVR, started on diltiazem and amiodarone Recent cardiac catheterization showing severe diagonal, circumflex OM1, and RCA disease, medical management recommended Started on Coumadin and Plavix Recent follow-up in clinic she was still in atrial flutter with rate 86 bpm  In follow-up today, she reports that she is doing better. Continues to have some days with shortness of breath but overall feels improved. Denies having any significant lower extremity edema. She has been taking Lasix 40 mg twice a day. Weight at home was 107 pounds, 108 pounds on today's visit in the clinic. Denies having any complications from her Coumadin. Legs are weak, having trouble getting her energy back. Appetite is adequate.  EKG on today's visit shows normal sinus rhythm with rate 70 bpm, nonspecific ST abnormality in the anterolateral leads  Previous 2-D echocardiogram showed normal LV function.  Past Medical History . Type II diabetes mellitus  . Osteoporosis  . Hyperlipidemia  . Hypertension  . Hypercalcemia  . CAD (coronary artery disease)    a. 1989 s/p MI, TPA, PTCA; b. 06/2014 NSTEMI/Cath: LM nl, LAD 30-40, D2 90, small, LCX 182m, OM1 90, RCA Cs2+ 70-80p, 50-14m, 90d (? ruptured plaque), EF 55-60%-->Med Rx. Marland Kitchen Zoster    past . Diverticulosis  . Hx of adenomatous colonic polyps 2010   last colonscopy 2010,Dr Vira Agar - no need for further colonoscopy given age . PAF/PAFlutter    a. 06/2014->amio and coumadin started. . Elevated LFTs    a. 06/2014 Abd U/S: no GB pathology. . Chronic diastolic CHF (congestive heart failure)    a. 07/2014 Echo: EF  60-65%, Gr 1 DD, mild to mod TR, PASP 88mmHg.  Past Surgical History . Abdominal hysterectomy     ovaries intact . Shoulder surgery     right   Allergies Allergen Reactions . Alendronate Sodium  . Isosorbide Other (See Comments) . Metformin And Related    Decreases appetite resulting in weight loss . Metronidazole    REACTION: sick/ GI . Nifedipine Other (See Comments)   Outpatient Encounter Prescriptions as of 08/20/2014: amiodarone (PACERONE) 200 MG tablet, Take 1 tablet (200 mg total) by mouth 2 (two) times daily. clopidogrel (PLAVIX) 75 MG tablet, Take 1 tablet (75 mg total) by mouth daily with breakfast. Cyanocobalamin (VITAMIN B-12 PO), Take 1 tablet by mouth daily. docusate sodium 100 MG CAPS, Take 100 mg by mouth 2 (two) times daily. esomeprazole (NEXIUM) 40 MG capsule, Take 40 mg by mouth daily at 12 noon. furosemide (LASIX) 40 MG tablet, Take 1 tablet (40 mg total) by mouth 2 (two) times daily. glucose blood (ONE TOUCH ULTRA TEST) test strip, USE ONE STRIP TO CHECK BLOOD SUGAR ONCE DAILY AND PRN FOR DM 250.0 lisinopril (PRINIVIL,ZESTRIL) 20 MG tablet, Take 1 tablet (20 mg total) by mouth daily. metoprolol (LOPRESSOR) 50 MG tablet, Take 1 tablet (50 mg total) by mouth 2 (two) times daily. nitroGLYCERIN (NITROSTAT) 0.4 MG SL tablet, Place 1 tablet (0.4 mg total) under the tongue every 5 (five) minutes as needed. For chest pain and if not improved seek medical attention in ER. ONETOUCH DELICA LANCETS 62X MISC, Use as instructed to check blood sugar once daily or as needed.  250.00 sitaGLIPtin (JANUVIA) 100 MG tablet, Take 100 mg by mouth daily.  warfarin (COUMADIN) 3 MG tablet, Take 1 tablet (3 mg total) by mouth daily at 6 PM. Or as directed (Patient taking differently: Take 1.5-3 mg by mouth daily at 6 PM. 3 mg daily except 1.5 mg on Mon, Fri) (DISCONTINUED) glipiZIDE (GLUCOTROL XL) 10 MG 24 hr tablet, Take 10 mg by mouth daily with  breakfast. pravastatin (PRAVACHOL) 20 MG tablet, Take 1 tablet (20 mg total) by mouth daily.   Family History . Heart disease Mother    CAD . Heart disease Father    CAD . Heart disease Brother    CAD  Social History . Marital Status: Married   Spouse Name: N/A   Number of Children: N/A . Years of Education: N/A  Occupational History . Not on file.  Social History Main Topics . Smoking status: Former Research scientist (life sciences) . Smokeless tobacco: Never Used . Alcohol Use: No . Drug Use: No . Sexual Activity: Not Currently     Review of Systems  Constitutional: Positive for fatigue.  Eyes: Negative.   Respiratory: Positive for shortness of breath.   Cardiovascular: Negative.   Gastrointestinal: Negative.   Musculoskeletal: Negative.   Neurological: Negative.   Hematological: Negative.   Psychiatric/Behavioral: Negative.   All other systems reviewed and are negative.  BP 140/72 mmHg  Pulse 70  Ht 5\' 2"  (1.575 m)  Wt 108 lb 12 oz (49.329 kg)  BMI 19.89 kg/m2   Physical Exam  Constitutional: She is oriented to person, place, and time. She appears well-developed and well-nourished.  HENT:  Head: Normocephalic.  Nose: Nose normal.  Mouth/Throat: Oropharynx is clear and moist.  Eyes: Conjunctivae are normal. Pupils are equal, round, and reactive to light.  Neck: Normal range of motion. Neck supple. No JVD present.  Cardiovascular: Normal rate, regular rhythm, S1 normal, S2 normal, normal heart sounds and intact distal pulses.  Exam reveals no gallop and no friction rub.   No murmur heard. Pulmonary/Chest: Effort normal and breath sounds normal. No respiratory distress. She has no wheezes. She has no rales. She exhibits no tenderness.  Abdominal: Soft. Bowel sounds are normal. She exhibits no distension. There is no tenderness.  Musculoskeletal: Normal range of motion. She exhibits no edema or tenderness.  Lymphadenopathy:     She has no cervical adenopathy.  Neurological: She is alert and oriented to person, place, and time. Coordination normal.  Skin: Skin is warm and dry. No rash noted. No erythema.  Psychiatric: She has a normal mood and affect. Her behavior is normal. Judgment and thought content normal.    Assessment and Plan  Nursing note and vitals reviewed.

## 2014-08-20 NOTE — Patient Instructions (Addendum)
You are doing well.  For weight less than 106 pounds,  Take lasix once a day   For weight less than 104 No lasix/furosemide  For weight >110, Take lasix twice a day  Please start 1/2 pill of the pravastatin daily  Please call us if you have new issues that need to be addressed before your next appt.  Your physician wants you to follow-up in: 3 months.  You will receive a reminder letter in the mail two months in advance. If you don't receive a letter, please call our office to schedule the follow-up appointment.

## 2014-08-21 ENCOUNTER — Ambulatory Visit (INDEPENDENT_AMBULATORY_CARE_PROVIDER_SITE_OTHER): Payer: Commercial Managed Care - HMO | Admitting: Family Medicine

## 2014-08-21 ENCOUNTER — Other Ambulatory Visit: Payer: Self-pay

## 2014-08-21 ENCOUNTER — Encounter: Payer: Self-pay | Admitting: *Deleted

## 2014-08-21 ENCOUNTER — Telehealth: Payer: Self-pay

## 2014-08-21 ENCOUNTER — Encounter: Payer: Self-pay | Admitting: Family Medicine

## 2014-08-21 VITALS — BP 122/76 | HR 67 | Temp 97.8°F | Ht 62.0 in | Wt 109.2 lb

## 2014-08-21 DIAGNOSIS — M810 Age-related osteoporosis without current pathological fracture: Secondary | ICD-10-CM

## 2014-08-21 DIAGNOSIS — Z23 Encounter for immunization: Secondary | ICD-10-CM

## 2014-08-21 DIAGNOSIS — E119 Type 2 diabetes mellitus without complications: Secondary | ICD-10-CM

## 2014-08-21 DIAGNOSIS — Z0000 Encounter for general adult medical examination without abnormal findings: Secondary | ICD-10-CM

## 2014-08-21 DIAGNOSIS — I10 Essential (primary) hypertension: Secondary | ICD-10-CM

## 2014-08-21 DIAGNOSIS — E785 Hyperlipidemia, unspecified: Secondary | ICD-10-CM

## 2014-08-21 DIAGNOSIS — E559 Vitamin D deficiency, unspecified: Secondary | ICD-10-CM

## 2014-08-21 MED ORDER — GLIPIZIDE ER 10 MG PO TB24: 10.0000 mg | ORAL_TABLET | Freq: Every day | ORAL | Status: DC

## 2014-08-21 MED ORDER — LISINOPRIL 20 MG PO TABS: 20.0000 mg | ORAL_TABLET | Freq: Every day | ORAL | Status: DC

## 2014-08-21 MED ORDER — CLOPIDOGREL BISULFATE 75 MG PO TABS: 75.0000 mg | ORAL_TABLET | Freq: Every day | ORAL | Status: DC

## 2014-08-21 MED ORDER — METOPROLOL TARTRATE 50 MG PO TABS: 50.0000 mg | ORAL_TABLET | Freq: Two times a day (BID) | ORAL | Status: DC

## 2014-08-21 MED ORDER — FUROSEMIDE 40 MG PO TABS: 40.0000 mg | ORAL_TABLET | Freq: Two times a day (BID) | ORAL | Status: DC

## 2014-08-21 MED ORDER — ESOMEPRAZOLE MAGNESIUM 40 MG PO CPDR: 40.0000 mg | DELAYED_RELEASE_CAPSULE | Freq: Every day | ORAL | Status: DC

## 2014-08-21 MED ORDER — AMIODARONE HCL 200 MG PO TABS: 200.0000 mg | ORAL_TABLET | Freq: Two times a day (BID) | ORAL | Status: DC

## 2014-08-21 NOTE — Assessment & Plan Note (Signed)
She has had dramatic weight loss, diuresis on Lasix 40 mg daily. Minimal lower shin, ankle edema on today's visit Now that she is back in normal sinus rhythm, we will use the diuretic regimen below: This was discussed with her in detail as well as her daughter Dennis Bast are doing well.  For weight less than 106 pounds, Take lasix once a day   For weight less than 104, No lasix/furosemide  For weight >110, Take lasix twice a day

## 2014-08-21 NOTE — Assessment & Plan Note (Signed)
She denies any symptoms concerning for angina. Continue current medications. No further workup

## 2014-08-21 NOTE — Assessment & Plan Note (Signed)
She is converted to normal sinus rhythm. We will suggest decreasing her amiodarone down to 200 mg daily, continue metoprolol

## 2014-08-21 NOTE — Assessment & Plan Note (Signed)
Blood pressure is well controlled on today's visit. No changes made to the medications. 

## 2014-08-21 NOTE — Progress Notes (Signed)
Subjective:    Patient ID: Bianca Mueller, female    DOB: [birth year], 80 y.o.   MRN: [id number]  HPI Here for annual medicare wellness exam and also acute and chronic medical problems   Recent hosp for MI and then CHF Medically managed LFT went up due to doxycycline and now back to normal   Still feels a bit weak  Not doing PT - walking herself  Saw cardiogist yesterday- 3 month f/u  Appetite is fair- has to eat low salt and that is hard for her  She does drink supplements like ensure     I have personally reviewed the Medicare Annual Wellness questionnaire and have noted 1. The patient's medical and social history 2. Their use of alcohol, tobacco or illicit drugs 3. Their current medications and supplements 4. The patient's functional ability including ADL's, fall risks, home safety risks and hearing or visual             impairment. 5. Diet and physical activities 6. Evidence for depression or mood disorders  The patients weight, height, BMI have been recorded in the chart and visual acuity is per eye clinic.  I have made referrals, counseling and provided education to the patient based review of the above and I have provided the pt with a written personalized care plan for preventive services.  See scanned forms.  Routine anticipatory guidance given to patient.  See health maintenance. Colon cancer screening 9/10 polyp - no more colonoscopies due to age and heart problems No bowel changes  Breast cancer screening mammogram 10/15 nl  Self breast exam-no lumps  Flu vaccine 10/15 Tetanus vaccine 12/06  Pneumovax 12/06 , wants prevnar today Zoster vaccine 7/11  Advance directive- has POA and living will already  Cognitive function dressed- see scanned forms- and if abnormal then additional documentation follows.  Overall good memory/ no problems   PMH and SH reviewed  Meds, vitals, and allergies reviewed.   ROS: See HPI.  Otherwise negative.      wt is  stabilized now  bmi of 19 Not sob now      Chemistry      Component Value Date/Time   NA 140 08/11/2014 0435   K 4.1 08/11/2014 0435   CL 101 08/11/2014 0435   CO2 26 08/11/2014 0435   BUN 22 08/11/2014 0435   CREATININE 0.65 08/11/2014 0435      Component Value Date/Time   CALCIUM 8.4 08/11/2014 0435   ALKPHOS 76 08/04/2014 1633   AST 36 08/04/2014 1633   ALT 43* 08/04/2014 1633   BILITOT 0.4 08/04/2014 1633      Lab Results  Component Value Date   WBC 8.4 08/11/2014   HGB 11.0* 08/11/2014   HCT 34.7* 08/11/2014   MCV 89.7 08/11/2014   PLT 374 08/11/2014   Lab Results  Component Value Date   TSH 2.080 07/18/2014   Lab Results  Component Value Date   HGBA1C 7.1* 07/18/2014  she has had some low glucose readings 2-3 days in am 50s-60s  Now eating a bedtime snack Poss due to not eating as much Not checking evening    Has question re: what to take for runny nose  It runs all the time   Also about compression stockings She was having more swelling - now improved  No pain from veins    Needs med refills   Lab Results  Component Value Date   ALT 43* 08/04/2014   AST 36 08/04/2014  ALKPHOS 76 08/04/2014   BILITOT 0.4 08/04/2014    Lab Results  Component Value Date   CHOL 84 07/19/2014   HDL 24* 07/19/2014   LDLCALC 41 07/19/2014   LDLDIRECT 95.1 08/18/2013   TRIG 94 07/19/2014   CHOLHDL 3.5 07/19/2014    Cardiologist wants to start back on 1/2 pravastatin daily  LFT were high in the hospital from doxycycline-then came back to nl    Patient Active Problem List   Diagnosis Date Noted  . Leukocytosis 08/07/2014  . Acute on chronic diastolic heart failure 46/65/9935  . Encounter for therapeutic drug monitoring 08/06/2014  . Chronic diastolic CHF (congestive heart failure)   . CAD (coronary artery disease)   . Type II diabetes mellitus   . Hyperlipidemia   . Acute on chronic diastolic CHF (congestive heart failure), NYHA class 3 07/27/2014  .  Atrial fibrillation   . Nausea without vomiting   . Transaminitis 07/20/2014  . Acute sinusitis 07/20/2014  . Atrial flutter 07/20/2014  . NSTEMI (non-ST elevated myocardial infarction) 07/18/2014  . Dyslipidemia 07/18/2014  . Acute bronchitis 07/15/2014  . Leg cramps 02/17/2014  . Encounter for Medicare annual wellness exam 08/19/2013  . Skin lesion 03/16/2013  . Other screening mammogram 05/17/2012  . Adult failure to thrive 05/17/2011  . Palpitations 02/16/2011  . DM2 (diabetes mellitus, type 2) 02/14/2011  . ANEMIA, VITAMIN B12 DEFICIENCY 09/26/2010  . PEDAL EDEMA 03/16/2010  . [place] ALLERGY 09/06/2009  . Vitamin D deficiency 03/02/2009  . DIVERTICULITIS, COLON 11/12/2008  . COLONIC POLYPS, HX OF 09/02/2008  . HYPERCHOLESTEROLEMIA 12/10/2006  . Essential hypertension 12/10/2006  . Osteoporosis 03/18/2002   Past Medical History  Diagnosis Date  . Osteoporosis   . Hyperlipidemia   . Hypertension   . Hypercalcemia   . CAD (coronary artery disease)     a. 1989 s/p MI, TPA, PTCA;  b. 06/2014 NSTEMI/Cath: LM nl, LAD 30-40, D2 90, small, LCX 146m, OM1 90, RCA Cs2+ 70-80p, 50-67m, 90d (? ruptured plaque), EF 55-60%-->Med Rx.  Marland Kitchen Zoster     past  . Diverticulosis   . Hx of adenomatous colonic polyps 2010    last colonscopy 2010,Dr Vira Agar - no need for further  colonoscopy given age  . PAF/PAFlutter     a. 06/2014->amio and coumadin started.  . Elevated LFTs     a. 06/2014 Abd U/S: no GB pathology.  . Chronic diastolic CHF (congestive heart failure)     a. 07/2014 Echo: EF 60-65%, Gr 1 DD, mild to mod TR, PASP 62mmHg.  . Myocardial infarction 1989  . Type II diabetes mellitus   . GERD (gastroesophageal reflux disease)   . Arthritis     "hips" (08/07/2014)   Past Surgical History  Procedure Laterality Date  . Vaginal hysterectomy  1970's?    ovaries intact  . Shoulder open rotator cuff repair Right 1980's  . Cardiac catheterization  07/22/2014  . Coronary  angioplasty  1989    "@ [facility]"   History  Substance Use Topics  . Smoking status: Former Smoker -- 0.25 packs/day for 30 years    Types: Cigarettes  . Smokeless tobacco: Never Used     Comment: "quit smoking in the 1980's"  . Alcohol Use: No   Family History  Problem Relation Age of Onset  . Heart disease Mother     CAD  . Heart disease Father     CAD  . Heart disease Brother     CAD   Allergies  Allergen Reactions  . Alendronate Sodium   . Doxycycline Other (See Comments)    Inc LFT  . Isosorbide Other (See Comments)  . Metformin And Related     Decreases appetite resulting in weight loss  . Metronidazole     REACTION: sick/ GI  . Nifedipine Other (See Comments)   Current Outpatient Prescriptions on File Prior to Visit  Medication Sig Dispense Refill  . Cyanocobalamin (VITAMIN B-12 PO) Take 1 tablet by mouth daily.    Marland Kitchen docusate sodium 100 MG CAPS Take 100 mg by mouth 2 (two) times daily. 10 capsule 0  . glucose blood (ONE TOUCH ULTRA TEST) test strip USE ONE STRIP TO CHECK BLOOD SUGAR ONCE DAILY AND PRN FOR DM 250.0 100 each 3  . nitroGLYCERIN (NITROSTAT) 0.4 MG SL tablet Place 1 tablet (0.4 mg total) under the tongue every 5 (five) minutes as needed. For chest pain and if not improved seek medical attention in ER. 15 tablet 0  . ONETOUCH DELICA LANCETS 41D MISC Use as instructed to check blood sugar once daily or as needed.  250.00 100 each 3  . pravastatin (PRAVACHOL) 20 MG tablet Take 1 tablet (20 mg total) by mouth daily. 30 tablet 6  . sitaGLIPtin (JANUVIA) 100 MG tablet Take 100 mg by mouth daily.    Marland Kitchen warfarin (COUMADIN) 3 MG tablet Take 1 tablet (3 mg total) by mouth daily at 6 PM. Or as directed (Patient taking differently: Take 1.5-3 mg by mouth daily at 6 PM. 3 mg daily except 1.5 mg on Mon, Fri) 45 tablet 11   No current facility-administered medications on file prior to visit.     Review of Systems Review of Systems  Review of Systems  Constitutional:  Negative for fever, appetite change, fatigue and unexpected weight change.  Eyes: Negative for pain and visual disturbance.  Respiratory: Negative for cough and shortness of breath.  (sob is much improved) Cardiovascular: Negative for cp or palpitations   neg for pedal edema (now)  Gastrointestinal: Negative for nausea, diarrhea and constipation.  Genitourinary: Negative for urgency and frequency.  Skin: Negative for pallor or rash   Neurological: Negative for weakness, light-headedness, numbness and headaches.  Hematological: Negative for adenopathy. Does not bruise/bleed easily.  Psychiatric/Behavioral: Negative for dysphoric mood. The patient is not nervous/anxious.     .         Objective:   Physical Exam  Constitutional: She appears well-developed and well-nourished. No distress.  Petite and well appearing  More fatigued than usual  HENT:  Head: Normocephalic and atraumatic.  Right Ear: External ear normal.  Left Ear: External ear normal.  Mouth/Throat: Oropharynx is clear and moist.  Eyes: Conjunctivae and EOM are normal. Pupils are equal, round, and reactive to light. No scleral icterus.  Neck: Normal range of motion. Neck supple. No JVD present. Carotid bruit is not present. No thyromegaly present.  Cardiovascular: Normal rate, regular rhythm, normal heart sounds and intact distal pulses.  Exam reveals no gallop.   Pulmonary/Chest: Effort normal and breath sounds normal. No respiratory distress. She has no wheezes. She exhibits no tenderness.  Mildly distant bs No crackles   Abdominal: Soft. Bowel sounds are normal. She exhibits no distension, no abdominal bruit and no mass. There is no tenderness.  Genitourinary: No breast swelling, tenderness, discharge or bleeding.  Musculoskeletal: Normal range of motion. She exhibits no edema or tenderness.  slt kyphosis  No CCE  Lymphadenopathy:    She has no cervical adenopathy.  Neurological: She is alert. She has normal  reflexes. No cranial nerve deficit. She exhibits normal muscle tone. Coordination normal.  Skin: Skin is warm and dry. No rash noted. No erythema. No pallor.  Psychiatric: She has a normal mood and affect.          Assessment & Plan:   Problem List Items Addressed This Visit      Cardiovascular and Mediastinum   Essential hypertension - Primary    bp in fair control at this time  BP Readings from Last 1 Encounters:  08/21/14 122/76   No changes needed Disc lifstyle change with low sodium diet and exercise  Rev recent cardiology visit - doing better  Labs reviewed     Relevant Medications      amiodarone (PACERONE) tablet      furosemide (LASIX) tablet      lisinopril (PRINIVIL,ZESTRIL) tablet      metoprolol (LOPRESSOR) tablet     Endocrine   DM2 (diabetes mellitus, type 2)    Lab Results  Component Value Date   HGBA1C 7.1* 07/18/2014    However since hosp - her am glucose readings have been low (poss due to decreased po intake)  Will cut back on glipizide xl to 5 mg for now  Will likely inc it back when glucose readings go up  Due for A1C in early Feb and then f/u      Relevant Medications      glipiZIDE (GLUCOTROL XL) 24 hr tablet      lisinopril (PRINIVIL,ZESTRIL) tablet     Musculoskeletal and Integument   Osteoporosis    Disc need for calcium/ vitamin D/ wt bearing exercise and bone density test every 2 y to monitor Disc safety/ fracture risk in detail   She declines further bone density tests or treatment at this time  Aware of risks  No fractures       Other   Encounter for Medicare annual wellness exam    Reviewed health habits including diet and exercise and skin cancer prevention Reviewed appropriate screening tests for age  Also reviewed health mt list, fam hx and immunization status , as well as social and family history   See HPI  Labs reviewed  prevnar vaccine today    Hyperlipidemia    Per cardiology will start on 1/2 pravastatin dose-  watching liver function  Disc goals for lipids and reasons to control them Rev labs with pt Rev low sat fat diet in detail     Relevant Medications      amiodarone (PACERONE) tablet      furosemide (LASIX) tablet      lisinopril (PRINIVIL,ZESTRIL) tablet      metoprolol (LOPRESSOR) tablet   Vitamin D deficiency    Rev vit D intake for bone health Will check with next labs      Other Visit Diagnoses    Need for vaccination with 13-polyvalent pneumococcal conjugate vaccine        Relevant Orders       Pneumococcal conjugate vaccine 13-valent (Completed)

## 2014-08-21 NOTE — Assessment & Plan Note (Signed)
We have encouraged  careful diet management in an effort to lose weight.  

## 2014-08-21 NOTE — Telephone Encounter (Signed)
If Dr. Glori Bickers does not do lab work today, December 4 We could put in an order for lab work next week and she comes in for Coumadin check I suspect it will look relatively good but we can check the potassium

## 2014-08-21 NOTE — Assessment & Plan Note (Signed)
Legs are weak following recent hospital admissions. She is independent, daughter visits frequently. No recent falls

## 2014-08-21 NOTE — Assessment & Plan Note (Signed)
No complications on her warfarin. INR check today, elevated at more than 4. Adjustments made

## 2014-08-21 NOTE — Telephone Encounter (Signed)
Spoke w/ pt and advised her of Dr. Donivan Scull recommendation to decrease amiodarone to 200 mg once daily. She asks that I call [facility] and give her instructions.  Advised [facility] of Dr. Donivan Scull recommendation.  She verbalizes understanding.  She would like to know when pt is due for next BMET to have her K+ checked since starting on lasix.  Please advise.  Thank you.

## 2014-08-21 NOTE — Patient Instructions (Signed)
Over the counter knee highs with support are fine - or the TED hose you are wearing now It is ok to take claritin 10 mg daily for runny nose  Schedule follow up in 3 months with labs prior  prevnar vaccine today  Cut your glipizide in 1/2 and just take 1/2 pill daily for now because of low blood sugar (if not improved let me know)- start checking some afternoon blood glucose readings several times per week  As you appetite gets better and eating more - we will likely go back to the whole pill   I will refill the medicines you need   Take care of yourself - let me know if you feel like you need any physical therapy

## 2014-08-21 NOTE — Progress Notes (Signed)
Pre visit review using our clinic review tool, if applicable. No additional management support is needed unless otherwise documented below in the visit note. 

## 2014-08-21 NOTE — Telephone Encounter (Signed)
Spoke w/ [name].  Advised her of Dr. Donivan Scull  Recommendation.  She verbalizes understanding and will call back w/ any further questions or concerns.

## 2014-08-23 NOTE — Assessment & Plan Note (Signed)
bp in fair control at this time  BP Readings from Last 1 Encounters:  08/21/14 122/76   No changes needed Disc lifstyle change with low sodium diet and exercise  Rev recent cardiology visit - doing better  Labs reviewed

## 2014-08-23 NOTE — Assessment & Plan Note (Signed)
Lab Results  Component Value Date   HGBA1C 7.1* 07/18/2014    However since hosp - her am glucose readings have been low (poss due to decreased po intake)  Will cut back on glipizide xl to 5 mg for now  Will likely inc it back when glucose readings go up  Due for A1C in early Feb and then f/u

## 2014-08-23 NOTE — Assessment & Plan Note (Signed)
Per cardiology will start on 1/2 pravastatin dose- watching liver function  Disc goals for lipids and reasons to control them Rev labs with pt Rev low sat fat diet in detail

## 2014-08-23 NOTE — Assessment & Plan Note (Signed)
Rev vit D intake for bone health Will check with next labs

## 2014-08-23 NOTE — Assessment & Plan Note (Signed)
Disc need for calcium/ vitamin D/ wt bearing exercise and bone density test every 2 y to monitor Disc safety/ fracture risk in detail   She declines further bone density tests or treatment at this time  Aware of risks  No fractures

## 2014-08-23 NOTE — Assessment & Plan Note (Signed)
Reviewed health habits including diet and exercise and skin cancer prevention Reviewed appropriate screening tests for age  Also reviewed health mt list, fam hx and immunization status , as well as social and family history   See HPI Labs reviewed  prevnar vaccine today   

## 2014-08-24 ENCOUNTER — Telehealth: Payer: Self-pay | Admitting: Family Medicine

## 2014-08-24 NOTE — Telephone Encounter (Signed)
emmi emailed °

## 2014-08-26 ENCOUNTER — Ambulatory Visit (INDEPENDENT_AMBULATORY_CARE_PROVIDER_SITE_OTHER): Payer: Commercial Managed Care - HMO

## 2014-08-26 DIAGNOSIS — I4891 Unspecified atrial fibrillation: Secondary | ICD-10-CM

## 2014-08-26 DIAGNOSIS — I4892 Unspecified atrial flutter: Secondary | ICD-10-CM

## 2014-08-26 DIAGNOSIS — Z5181 Encounter for therapeutic drug level monitoring: Secondary | ICD-10-CM

## 2014-08-26 LAB — POCT INR: INR: 2.4

## 2014-08-27 ENCOUNTER — Encounter (HOSPITAL_COMMUNITY): Payer: Self-pay | Admitting: Interventional Cardiology

## 2014-08-28 ENCOUNTER — Encounter: Payer: Self-pay | Admitting: Cardiovascular Disease

## 2014-09-02 ENCOUNTER — Ambulatory Visit (INDEPENDENT_AMBULATORY_CARE_PROVIDER_SITE_OTHER): Payer: Commercial Managed Care - HMO

## 2014-09-02 DIAGNOSIS — I4892 Unspecified atrial flutter: Secondary | ICD-10-CM

## 2014-09-02 DIAGNOSIS — Z5181 Encounter for therapeutic drug level monitoring: Secondary | ICD-10-CM

## 2014-09-02 DIAGNOSIS — I4891 Unspecified atrial fibrillation: Secondary | ICD-10-CM

## 2014-09-02 LAB — POCT INR: INR: 2.2

## 2014-09-04 ENCOUNTER — Telehealth: Payer: Self-pay

## 2014-09-04 NOTE — Telephone Encounter (Signed)
Request from [employer](Disability) , sent to [employer] on 09/04/2014

## 2014-09-14 ENCOUNTER — Ambulatory Visit (INDEPENDENT_AMBULATORY_CARE_PROVIDER_SITE_OTHER): Payer: Commercial Managed Care - HMO | Admitting: Family Medicine

## 2014-09-14 DIAGNOSIS — Z5181 Encounter for therapeutic drug level monitoring: Secondary | ICD-10-CM

## 2014-09-14 DIAGNOSIS — I4891 Unspecified atrial fibrillation: Secondary | ICD-10-CM

## 2014-09-14 DIAGNOSIS — I4892 Unspecified atrial flutter: Secondary | ICD-10-CM

## 2014-09-14 LAB — POCT INR: INR: 2.7

## 2014-09-24 ENCOUNTER — Other Ambulatory Visit: Payer: Self-pay | Admitting: *Deleted

## 2014-09-24 MED ORDER — WARFARIN SODIUM 3 MG PO TABS
3.0000 mg | ORAL_TABLET | Freq: Every day | ORAL | Status: DC
Start: 2014-09-24 — End: 2015-09-29

## 2014-09-24 MED ORDER — SITAGLIPTIN PHOSPHATE 100 MG PO TABS: 100.0000 mg | ORAL_TABLET | Freq: Every day | ORAL | Status: DC

## 2014-09-24 MED ORDER — METOPROLOL TARTRATE 50 MG PO TABS: 50.0000 mg | ORAL_TABLET | Freq: Two times a day (BID) | ORAL | Status: DC

## 2014-09-24 MED ORDER — AMIODARONE HCL 200 MG PO TABS: 200.0000 mg | ORAL_TABLET | Freq: Two times a day (BID) | ORAL | Status: DC

## 2014-09-24 MED ORDER — PRAVASTATIN SODIUM 20 MG PO TABS: 20.0000 mg | ORAL_TABLET | Freq: Every day | ORAL | Status: DC

## 2014-09-24 MED ORDER — ESOMEPRAZOLE MAGNESIUM 40 MG PO CPDR: 40.0000 mg | DELAYED_RELEASE_CAPSULE | Freq: Every day | ORAL | Status: DC

## 2014-09-24 MED ORDER — CLOPIDOGREL BISULFATE 75 MG PO TABS: 75.0000 mg | ORAL_TABLET | Freq: Every day | ORAL | Status: DC

## 2014-09-24 MED ORDER — LISINOPRIL 20 MG PO TABS: 20.0000 mg | ORAL_TABLET | Freq: Every day | ORAL | Status: DC

## 2014-09-24 MED ORDER — GLIPIZIDE ER 10 MG PO TB24: 10.0000 mg | ORAL_TABLET | Freq: Every day | ORAL | Status: DC

## 2014-09-24 MED ORDER — FUROSEMIDE 40 MG PO TABS: 40.0000 mg | ORAL_TABLET | Freq: Two times a day (BID) | ORAL | Status: DC

## 2014-09-24 MED ORDER — WARFARIN SODIUM 3 MG PO TABS: 3.0000 mg | ORAL_TABLET | Freq: Every day | ORAL | Status: DC

## 2014-09-25 ENCOUNTER — Telehealth: Payer: Self-pay | Admitting: *Deleted

## 2014-09-25 NOTE — Telephone Encounter (Signed)
[facility] on [address] says they are switching brands of the Coumadin.  Is it okay to use [employer] [facility]?  Faxed form on your desk.

## 2014-09-25 NOTE — Telephone Encounter (Signed)
Ok to switch brands, would you like for me to call [facility]?

## 2014-09-25 NOTE — Telephone Encounter (Signed)
No, I'll do it.

## 2014-10-12 ENCOUNTER — Other Ambulatory Visit (INDEPENDENT_AMBULATORY_CARE_PROVIDER_SITE_OTHER): Payer: Commercial Managed Care - HMO

## 2014-10-12 DIAGNOSIS — I4891 Unspecified atrial fibrillation: Secondary | ICD-10-CM

## 2014-10-12 DIAGNOSIS — Z5181 Encounter for therapeutic drug level monitoring: Secondary | ICD-10-CM

## 2014-10-12 DIAGNOSIS — I4892 Unspecified atrial flutter: Secondary | ICD-10-CM

## 2014-10-12 LAB — POCT INR: INR: 2.1

## 2014-11-02 ENCOUNTER — Other Ambulatory Visit: Payer: Commercial Managed Care - HMO

## 2014-11-05 ENCOUNTER — Ambulatory Visit (INDEPENDENT_AMBULATORY_CARE_PROVIDER_SITE_OTHER): Payer: Commercial Managed Care - HMO | Admitting: Family Medicine

## 2014-11-05 ENCOUNTER — Encounter: Payer: Self-pay | Admitting: Family Medicine

## 2014-11-05 VITALS — BP 162/88 | HR 64 | Temp 99.0°F | Wt 110.5 lb

## 2014-11-05 DIAGNOSIS — R42 Dizziness and giddiness: Secondary | ICD-10-CM | POA: Diagnosis not present

## 2014-11-05 DIAGNOSIS — E119 Type 2 diabetes mellitus without complications: Secondary | ICD-10-CM

## 2014-11-05 MED ORDER — AMIODARONE HCL 200 MG PO TABS: 200.0000 mg | ORAL_TABLET | Freq: Every day | ORAL | Status: DC

## 2014-11-05 NOTE — Progress Notes (Signed)
Pre visit review using our clinic review tool, if applicable. No additional management support is needed unless otherwise documented below in the visit note.  Patient was at the grocery store this morning and had gathered her groceries and was near the check-out when she started to feel like she was "walking crooked" and she felt groggy.  A lady that worked at the store put her arms around her to stabilize her until they could get a chair for her to sit in.  Just as quickly as it happened, she felt better and still feels fine now but family requests that she consult with the doctor.  No fall, no syncope.  Single episode.  She had an egg biscuit at [place], then went to the grocery store.  No CP.  No heart racing.  She didn't have slurred speech.  She felt transiently SOB at the time.  She wasn't sweaty at the time, but had a sweat last night that self resolved.  She could still move her arms and legs normally.  No room spinning.  She felt lightheaded, but not fully syncopal.  No vision changes.    She didn't have to do anything other than sit down briefly to feel better.    No episodes like this prev.  Still on regular/baseline meds.  Has had sugars usually down in the 60s in AM, after glipizide was added back on.    Meds, vitals, and allergies reviewed.   ROS: See HPI.  Otherwise, noncontributory.  GEN: nad, alert and oriented HEENT: mucous membranes moist NECK: supple w/o LA CV: rrr. PULM: ctab, no inc wob ABD: soft, +bs EXT: no edema SKIN: no acute rash CN 2-12 wnl B, S/S/DTR wnl x4

## 2014-11-05 NOTE — Patient Instructions (Addendum)
Your EKG looks okay.  I'll await your labs.   Keep a snack nearby in case this was from a low sugar.  Stop the glipizide for now.   Update [place] in a few days.  Take care.  Glad to see you.

## 2014-11-06 DIAGNOSIS — R42 Dizziness and giddiness: Secondary | ICD-10-CM | POA: Insufficient documentation

## 2014-11-06 LAB — COMPREHENSIVE METABOLIC PANEL
ALK PHOS: 67 U/L (ref 39–117)
ALT: 53 U/L — AB (ref 0–35)
AST: 41 U/L — ABNORMAL HIGH (ref 0–37)
Albumin: 3.9 g/dL (ref 3.5–5.2)
BUN: 27 mg/dL — AB (ref 6–23)
CALCIUM: 9.7 mg/dL (ref 8.4–10.5)
CHLORIDE: 96 meq/L (ref 96–112)
CO2: 26 mEq/L (ref 19–32)
CREATININE: 0.97 mg/dL (ref 0.40–1.20)
GFR: 57.84 mL/min — ABNORMAL LOW (ref >60.00)
Glucose, Bld: 167 mg/dL — ABNORMAL HIGH (ref 70–99)
Potassium: 4.5 mEq/L (ref 3.5–5.1)
SODIUM: 133 meq/L — AB (ref 135–145)
Total Bilirubin: 0.3 mg/dL (ref 0.2–1.2)
Total Protein: 8.1 g/dL (ref 6.0–8.3)

## 2014-11-06 LAB — CBC WITH DIFFERENTIAL/PLATELET
BASOS ABS: 0.1 10*3/uL (ref 0.0–0.1)
Basophils Relative: 0.8 % (ref 0.0–3.0)
Eosinophils Absolute: 0 10*3/uL (ref 0.0–0.7)
Eosinophils Relative: 0.4 % (ref 0.0–5.0)
HCT: 43.5 % (ref 36.0–46.0)
HEMOGLOBIN: 14.3 g/dL (ref 12.0–15.0)
LYMPHS PCT: 19 % (ref 12.0–46.0)
Lymphs Abs: 1.9 10*3/uL (ref 0.7–4.0)
MCHC: 32.9 g/dL (ref 30.0–36.0)
MCV: 79.8 fl (ref 78.0–100.0)
Monocytes Absolute: 0.6 10*3/uL (ref 0.1–1.0)
Monocytes Relative: 6.3 % (ref 3.0–12.0)
NEUTROS ABS: 7.4 10*3/uL (ref 1.4–7.7)
NEUTROS PCT: 73.5 % (ref 43.0–77.0)
Platelets: 384 10*3/uL (ref 150.0–400.0)
RBC: 5.45 Mil/uL — ABNORMAL HIGH (ref 3.87–5.11)
RDW: 17.3 % — AB (ref 11.5–15.5)
WBC: 10.1 10*3/uL (ref 4.0–10.5)

## 2014-11-06 LAB — HEMOGLOBIN A1C: Hgb A1c MFr Bld: 7 % — ABNORMAL HIGH (ref 4.6–6.5)

## 2014-11-06 NOTE — Assessment & Plan Note (Signed)
Single episode, possibly from low sugar, no sx and normal exam now.  ekg unremarkable.  No CP.  Would stop glipizide for now.  D/w pt and daughter.  Continue other meds.   Check basic labs in the meantime, including A1c.  Still looks to be okay for outpatient f/u, as she feels well now.  She agrees.   >25 minutes spent in face to face time with patient, >50% spent in counselling or coordination of care.  Routed to PCP as FYI. See AVS.

## 2014-11-09 ENCOUNTER — Telehealth: Payer: Self-pay | Admitting: *Deleted

## 2014-11-09 ENCOUNTER — Other Ambulatory Visit (INDEPENDENT_AMBULATORY_CARE_PROVIDER_SITE_OTHER): Payer: Commercial Managed Care - HMO

## 2014-11-09 ENCOUNTER — Other Ambulatory Visit: Payer: Self-pay | Admitting: *Deleted

## 2014-11-09 DIAGNOSIS — Z5181 Encounter for therapeutic drug level monitoring: Secondary | ICD-10-CM | POA: Diagnosis not present

## 2014-11-09 DIAGNOSIS — I4891 Unspecified atrial fibrillation: Secondary | ICD-10-CM

## 2014-11-09 DIAGNOSIS — I4892 Unspecified atrial flutter: Secondary | ICD-10-CM

## 2014-11-09 LAB — POCT INR: INR: 1.7

## 2014-11-09 MED ORDER — GLIPIZIDE ER 2.5 MG PO TB24
2.5000 mg | ORAL_TABLET | Freq: Every day | ORAL | Status: DC
Start: 2014-11-09 — End: 2014-11-09

## 2014-11-09 MED ORDER — GLIPIZIDE ER 2.5 MG PO TB24: 2.5000 mg | ORAL_TABLET | Freq: Every day | ORAL | Status: DC

## 2014-11-09 NOTE — Telephone Encounter (Signed)
FBS off Glipizide XL has been running ~ 146.  Please advise.

## 2014-11-09 NOTE — Telephone Encounter (Signed)
I would think glipizide 2.5mg  a day in AM would be reasonable, but I wanted PCP's input.  I started the order in the meantime, but didn't send the rx yet.  [facility]- what do you think?

## 2014-11-09 NOTE — Telephone Encounter (Signed)
That is reasonable -have her keep an eye on glucose readings and alert me if any hypoglycemia Thanks !

## 2014-11-09 NOTE — Telephone Encounter (Signed)
Patient advised. Medication phoned to pharmacy.  

## 2014-11-16 ENCOUNTER — Other Ambulatory Visit (INDEPENDENT_AMBULATORY_CARE_PROVIDER_SITE_OTHER): Payer: Commercial Managed Care - HMO

## 2014-11-16 DIAGNOSIS — E559 Vitamin D deficiency, unspecified: Secondary | ICD-10-CM | POA: Diagnosis not present

## 2014-11-16 DIAGNOSIS — I4892 Unspecified atrial flutter: Secondary | ICD-10-CM

## 2014-11-16 DIAGNOSIS — Z5181 Encounter for therapeutic drug level monitoring: Secondary | ICD-10-CM | POA: Diagnosis not present

## 2014-11-16 DIAGNOSIS — E785 Hyperlipidemia, unspecified: Secondary | ICD-10-CM

## 2014-11-16 DIAGNOSIS — I4891 Unspecified atrial fibrillation: Secondary | ICD-10-CM | POA: Diagnosis not present

## 2014-11-16 DIAGNOSIS — M810 Age-related osteoporosis without current pathological fracture: Secondary | ICD-10-CM | POA: Diagnosis not present

## 2014-11-16 LAB — LIPID PANEL
CHOLESTEROL: 172 mg/dL (ref 0–200)
HDL: 41.2 mg/dL (ref >39.00)
NonHDL: 130.8
TRIGLYCERIDES: 270 mg/dL — AB (ref 0.0–149.0)
Total CHOL/HDL Ratio: 4
VLDL: 54 mg/dL — ABNORMAL HIGH (ref 0.0–40.0)

## 2014-11-16 LAB — AST: AST: 30 U/L (ref 0–37)

## 2014-11-16 LAB — VITAMIN D 25 HYDROXY (VIT D DEFICIENCY, FRACTURES): VITD: 17.41 ng/mL — AB (ref 30.00–100.00)

## 2014-11-16 LAB — POCT INR: INR: 2.7

## 2014-11-16 LAB — ALT: ALT: 38 U/L — ABNORMAL HIGH (ref 0–35)

## 2014-11-17 LAB — LDL CHOLESTEROL, DIRECT: LDL DIRECT: 102 mg/dL

## 2014-11-18 ENCOUNTER — Ambulatory Visit (INDEPENDENT_AMBULATORY_CARE_PROVIDER_SITE_OTHER): Payer: Commercial Managed Care - HMO | Admitting: Cardiovascular Disease

## 2014-11-18 ENCOUNTER — Encounter: Payer: Self-pay | Admitting: Cardiovascular Disease

## 2014-11-18 VITALS — BP 140/70 | HR 55 | Ht 62.0 in | Wt 110.0 lb

## 2014-11-18 DIAGNOSIS — I10 Essential (primary) hypertension: Secondary | ICD-10-CM

## 2014-11-18 DIAGNOSIS — I5033 Acute on chronic diastolic (congestive) heart failure: Secondary | ICD-10-CM

## 2014-11-18 DIAGNOSIS — I4891 Unspecified atrial fibrillation: Secondary | ICD-10-CM | POA: Diagnosis not present

## 2014-11-18 DIAGNOSIS — E785 Hyperlipidemia, unspecified: Secondary | ICD-10-CM

## 2014-11-18 DIAGNOSIS — I2583 Coronary atherosclerosis due to lipid rich plaque: Secondary | ICD-10-CM

## 2014-11-18 DIAGNOSIS — I4892 Unspecified atrial flutter: Secondary | ICD-10-CM

## 2014-11-18 DIAGNOSIS — E119 Type 2 diabetes mellitus without complications: Secondary | ICD-10-CM

## 2014-11-18 DIAGNOSIS — I2510 Atherosclerotic heart disease of native coronary artery without angina pectoris: Secondary | ICD-10-CM

## 2014-11-18 MED ORDER — PRAVASTATIN SODIUM 40 MG PO TABS: 40.0000 mg | ORAL_TABLET | Freq: Every day | ORAL | Status: DC

## 2014-11-18 NOTE — Progress Notes (Signed)
Patient ID: Bianca Mueller, female    DOB: 01-23-1926, 81 y.o.   MRN: [id number]  HPI Comments: 81 year old female with history of non-ST elevation MI complicated by paroxysmal atrial fibrillation and flutter who presents for follow-up of her coronary artery disease and atrial flutter In the hospital she had atrial fibrillation/flutter with RVR, started on diltiazem and amiodarone  cardiac catheterization showing severe diagonal, circumflex OM1, and RCA disease, medical management recommended Started on Coumadin and Plavix  follow-up in clinic she was still in atrial flutter with rate 86 bpm, normal sinus rhythm on her last clinic visit  In follow-up today, she reports that she is feeling well. Denies having any significant shortness of breath. She does report her weight goes up and down, for the most part takes Lasix once per day, rarely twice per day for high her weight. She does report one episode where she was feeling very staggery. Lab work done and most labs within normal limits Vitamin D was low, hemoglobin A1c of 7. She denies any further episodes Weight has been relatively stable  EKG on today's visit shows normal sinus rhythm with rate 55 bpm, nonspecific ST abnormality  Other past medical history Previous 2-D echocardiogram showed normal LV function.   Allergies  Allergen Reactions  . Alendronate Sodium   . Doxycycline Other (See Comments)    Inc LFT  . Isosorbide Other (See Comments)  . Metformin And Related     Decreases appetite resulting in weight loss  . Metronidazole     REACTION: sick/ GI  . Nifedipine Other (See Comments)    Outpatient Encounter Prescriptions as of 11/18/2014  Medication Sig  . amiodarone (PACERONE) 200 MG tablet Take 1 tablet (200 mg total) by mouth daily.  . clopidogrel (PLAVIX) 75 MG tablet Take 1 tablet (75 mg total) by mouth daily with breakfast.  . Cyanocobalamin (VITAMIN B-12 PO) Take 1 tablet by mouth daily.  Marland Kitchen docusate sodium 100 MG  CAPS Take 100 mg by mouth 2 (two) times daily.  Marland Kitchen esomeprazole (NEXIUM) 40 MG capsule Take 1 capsule (40 mg total) by mouth daily at 12 noon.  . furosemide (LASIX) 40 MG tablet Take 1 tablet (40 mg total) by mouth 2 (two) times daily. As directed by cardiologist  . glipiZIDE (GLUCOTROL XL) 2.5 MG 24 hr tablet Take 1 tablet (2.5 mg total) by mouth daily with breakfast.  . glucose blood (ONE TOUCH ULTRA TEST) test strip USE ONE STRIP TO CHECK BLOOD SUGAR ONCE DAILY AND PRN FOR DM 250.0  . lisinopril (PRINIVIL,ZESTRIL) 20 MG tablet Take 1 tablet (20 mg total) by mouth daily.  . metoprolol (LOPRESSOR) 50 MG tablet Take 1 tablet (50 mg total) by mouth 2 (two) times daily.  . nitroGLYCERIN (NITROSTAT) 0.4 MG SL tablet Place 1 tablet (0.4 mg total) under the tongue every 5 (five) minutes as needed. For chest pain and if not improved seek medical attention in ER.  Glory Rosebush DELICA LANCETS 03K MISC Use as instructed to check blood sugar once daily or as needed.  250.00  . pravastatin (PRAVACHOL) 40 MG tablet Take 1 tablet (40 mg total) by mouth daily.  . sitaGLIPtin (JANUVIA) 100 MG tablet Take 1 tablet (100 mg total) by mouth daily.  Marland Kitchen warfarin (COUMADIN) 3 MG tablet Take 1 tablet (3 mg total) by mouth daily at 6 PM. Take as directed by the [facility].  . [DISCONTINUED] pravastatin (PRAVACHOL) 20 MG tablet Take 1 tablet (20 mg total) by mouth daily.  Past Medical History  Diagnosis Date  . Osteoporosis   . Hyperlipidemia   . Hypertension   . Hypercalcemia   . CAD (coronary artery disease)     a. 1989 s/p MI, TPA, PTCA;  b. 06/2014 NSTEMI/Cath: LM nl, LAD 30-40, D2 90, small, LCX 171m, OM1 90, RCA Cs2+ 70-80p, 50-66m, 90d (? ruptured plaque), EF 55-60%-->Med Rx.  Marland Kitchen Zoster     past  . Diverticulosis   . Hx of adenomatous colonic polyps 2010    last colonscopy 2010,Dr Vira Agar - no need for further  colonoscopy given age  . PAF/PAFlutter     a. 06/2014->amio and coumadin started.  .  Elevated LFTs     a. 06/2014 Abd U/S: no GB pathology.  . Chronic diastolic CHF (congestive heart failure)     a. 07/2014 Echo: EF 60-65%, Gr 1 DD, mild to mod TR, PASP 79mmHg.  . Myocardial infarction 1989  . Type II diabetes mellitus   . GERD (gastroesophageal reflux disease)   . Arthritis     "hips" (08/07/2014)    Past Surgical History  Procedure Laterality Date  . Vaginal hysterectomy  1970's?    ovaries intact  . Shoulder open rotator cuff repair Right 1980's  . Cardiac catheterization  07/22/2014  . Coronary angioplasty  1989    "@ [facility]"  . Left heart catheterization with coronary angiogram N/A 07/22/2014    Procedure: LEFT HEART CATHETERIZATION WITH CORONARY ANGIOGRAM;  Surgeon: Sinclair Grooms, MD;  Location: [facility] CATH LAB;  Service: Cardiovascular;  Laterality: N/A;    Social History  reports that she has quit smoking. Her smoking use included Cigarettes. She has a 7.5 pack-year smoking history. She has never used smokeless tobacco. She reports that she does not drink alcohol or use illicit drugs.  Family History family history includes Heart disease in her brother, father, and mother.    Review of Systems  Constitutional: Negative.   Eyes: Negative.   Respiratory: Negative.   Cardiovascular: Negative.   Gastrointestinal: Negative.   Musculoskeletal: Negative.   Neurological: Negative.   Hematological: Negative.   Psychiatric/Behavioral: Negative.   All other systems reviewed and are negative.  BP 140/70 mmHg  Pulse 55  Ht 5\' 2"  (1.575 m)  Wt 110 lb (49.896 kg)  BMI 20.11 kg/m2   Physical Exam  Constitutional: She is oriented to person, place, and time. She appears well-developed and well-nourished.  HENT:  Head: Normocephalic.  Nose: Nose normal.  Mouth/Throat: Oropharynx is clear and moist.  Eyes: Conjunctivae are normal. Pupils are equal, round, and reactive to light.  Neck: Normal range of motion. Neck supple. No JVD present.  Cardiovascular:  Normal rate, regular rhythm, S1 normal, S2 normal, normal heart sounds and intact distal pulses.  Exam reveals no gallop and no friction rub.   No murmur heard. Pulmonary/Chest: Effort normal and breath sounds normal. No respiratory distress. She has no wheezes. She has no rales. She exhibits no tenderness.  Abdominal: Soft. Bowel sounds are normal. She exhibits no distension. There is no tenderness.  Musculoskeletal: Normal range of motion. She exhibits no edema or tenderness.  Lymphadenopathy:    She has no cervical adenopathy.  Neurological: She is alert and oriented to person, place, and time. Coordination normal.  Skin: Skin is warm and dry. No rash noted. No erythema.  Psychiatric: She has a normal mood and affect. Her behavior is normal. Judgment and thought content normal.    Assessment and Plan  Nursing note and  vitals reviewed.

## 2014-11-18 NOTE — Assessment & Plan Note (Signed)
Currently with no symptoms of angina. No further workup at this time. Continue current medication regimen. 

## 2014-11-18 NOTE — Assessment & Plan Note (Signed)
Maintaining normal sinus rhythm. No changes to her medications 

## 2014-11-18 NOTE — Assessment & Plan Note (Signed)
Weight is stable on her current regimen. Suggested she try to avoid overdiuresis. Creatinine and electrolytes within reasonable range

## 2014-11-18 NOTE — Patient Instructions (Addendum)
You are doing well.  Please increase the pravastatin up to 40 mg daily  Please call us if you have new issues that need to be addressed before your next appt.  Your physician wants you to follow-up in: 6 months.  You will receive a reminder letter in the mail two months in advance. If you don't receive a letter, please call our office to schedule the follow-up appointment.

## 2014-11-18 NOTE — Assessment & Plan Note (Signed)
Blood pressure is well controlled on today's visit. No changes made to the medications. 

## 2014-11-18 NOTE — Assessment & Plan Note (Signed)
We have suggested she increase her pravastatin up to 40 mg daily, goal LDL less than 70

## 2014-11-18 NOTE — Assessment & Plan Note (Signed)
We have encouraged continued exercise, careful diet management  Hemoglobin A1c of 7

## 2014-11-20 ENCOUNTER — Ambulatory Visit (INDEPENDENT_AMBULATORY_CARE_PROVIDER_SITE_OTHER): Payer: Commercial Managed Care - HMO | Admitting: Family Medicine

## 2014-11-20 ENCOUNTER — Encounter: Payer: Self-pay | Admitting: Family Medicine

## 2014-11-20 VITALS — BP 136/70 | HR 57 | Temp 97.7°F | Ht 62.0 in | Wt 111.0 lb

## 2014-11-20 DIAGNOSIS — L989 Disorder of the skin and subcutaneous tissue, unspecified: Secondary | ICD-10-CM | POA: Diagnosis not present

## 2014-11-20 DIAGNOSIS — E559 Vitamin D deficiency, unspecified: Secondary | ICD-10-CM

## 2014-11-20 DIAGNOSIS — E119 Type 2 diabetes mellitus without complications: Secondary | ICD-10-CM | POA: Diagnosis not present

## 2014-11-20 MED ORDER — ERGOCALCIFEROL 1.25 MG (50000 UT) PO CAPS: 50000.0000 [IU] | ORAL_CAPSULE | ORAL | Status: DC

## 2014-11-20 NOTE — Progress Notes (Signed)
Pre visit review using our clinic review tool, if applicable. No additional management support is needed unless otherwise documented below in the visit note. 

## 2014-11-20 NOTE — Progress Notes (Signed)
Subjective:    Patient ID: Bianca Mueller, female    DOB: [birth year], 81 y.o.   MRN: [id number]  HPI Here for f/u of chronic health problems   (also on the way out mentions a sore spot on top of R ear)- hurts to lie on it (for 6 weeks or more)  Wt is up 1 lb with bmi of 20  Seen recently for lightheadedness    (no more episodes since)  Her appetite is ok - stays hungry all the time  Nl w/u  Held glipizide -susp of low glucose Her readings rebounded and 2.5 mg was re started (recent)  In the am - her blood sugars are in 60-70 range  During the day - in the 140s  She usually also eats a bedtime snack   Lab Results  Component Value Date   HGBA1C 7.0* 11/05/2014   Also on Januvia   BP Readings from Last 3 Encounters:  11/20/14 136/70  11/18/14 140/70  11/05/14 162/88   followed by cardiol  Lab Results  Component Value Date   CHOL 172 11/16/2014   HDL 41.20 11/16/2014   LDLCALC 41 07/19/2014   LDLDIRECT 102.0 11/16/2014   TRIG 270.0* 11/16/2014   CHOLHDL 4 11/16/2014   Her cardiologist inc her pravastatin to 40 for better control  Is being aggressive with her LDL to get to 70   Vit D level at 17.4  She was not taken any vit D   Patient Active Problem List   Diagnosis Date Noted  . Lightheaded 11/06/2014  . Leukocytosis 08/07/2014  . Acute on chronic diastolic heart failure 35/32/9924  . Encounter for therapeutic drug monitoring 08/06/2014  . Chronic diastolic CHF (congestive heart failure)   . CAD (coronary artery disease)   . Type II diabetes mellitus   . Hyperlipidemia   . Acute on chronic diastolic CHF (congestive heart failure), NYHA class 3 07/27/2014  . Atrial fibrillation   . Nausea without vomiting   . Transaminitis 07/20/2014  . Acute sinusitis 07/20/2014  . Atrial flutter 07/20/2014  . NSTEMI (non-ST elevated myocardial infarction) 07/18/2014  . Dyslipidemia 07/18/2014  . Acute bronchitis 07/15/2014  . Leg cramps 02/17/2014  . Encounter for  Medicare annual wellness exam 08/19/2013  . Skin lesion 03/16/2013  . Other screening mammogram 05/17/2012  . Adult failure to thrive 05/17/2011  . Palpitations 02/16/2011  . DM2 (diabetes mellitus, type 2) 02/14/2011  . ANEMIA, VITAMIN B12 DEFICIENCY 09/26/2010  . PEDAL EDEMA 03/16/2010  . [place] ALLERGY 09/06/2009  . Vitamin D deficiency 03/02/2009  . DIVERTICULITIS, COLON 11/12/2008  . COLONIC POLYPS, HX OF 09/02/2008  . HYPERCHOLESTEROLEMIA 12/10/2006  . Essential hypertension 12/10/2006  . Osteoporosis 03/18/2002   Past Medical History  Diagnosis Date  . Osteoporosis   . Hyperlipidemia   . Hypertension   . Hypercalcemia   . CAD (coronary artery disease)     a. 1989 s/p MI, TPA, PTCA;  b. 06/2014 NSTEMI/Cath: LM nl, LAD 30-40, D2 90, small, LCX 124m, OM1 90, RCA Cs2+ 70-80p, 50-12m, 90d (? ruptured plaque), EF 55-60%-->Med Rx.  Marland Kitchen Zoster     past  . Diverticulosis   . Hx of adenomatous colonic polyps 2010    last colonscopy 2010,Dr Vira Agar - no need for further  colonoscopy given age  . PAF/PAFlutter     a. 06/2014->amio and coumadin started.  . Elevated LFTs     a. 06/2014 Abd U/S: no GB pathology.  . Chronic diastolic CHF (congestive  heart failure)     a. 07/2014 Echo: EF 60-65%, Gr 1 DD, mild to mod TR, PASP 41mmHg.  . Myocardial infarction 1989  . Type II diabetes mellitus   . GERD (gastroesophageal reflux disease)   . Arthritis     "hips" (08/07/2014)   Past Surgical History  Procedure Laterality Date  . Vaginal hysterectomy  1970's?    ovaries intact  . Shoulder open rotator cuff repair Right 1980's  . Cardiac catheterization  07/22/2014  . Coronary angioplasty  1989    "@ [facility]"  . Left heart catheterization with coronary angiogram N/A 07/22/2014    Procedure: LEFT HEART CATHETERIZATION WITH CORONARY ANGIOGRAM;  Surgeon: Sinclair Grooms, MD;  Location: [facility] CATH LAB;  Service: Cardiovascular;  Laterality: N/A;   History  Substance Use Topics  . Smoking  status: Former Smoker -- 0.25 packs/day for 30 years    Types: Cigarettes  . Smokeless tobacco: Never Used     Comment: "quit smoking in the 1980's"  . Alcohol Use: No   Family History  Problem Relation Age of Onset  . Heart disease Mother     CAD  . Heart disease Father     CAD  . Heart disease Brother     CAD   Allergies  Allergen Reactions  . Alendronate Sodium   . Doxycycline Other (See Comments)    Inc LFT  . Isosorbide Other (See Comments)  . Metformin And Related     Decreases appetite resulting in weight loss  . Metronidazole     REACTION: sick/ GI  . Nifedipine Other (See Comments)   Current Outpatient Prescriptions on File Prior to Visit  Medication Sig Dispense Refill  . amiodarone (PACERONE) 200 MG tablet Take 1 tablet (200 mg total) by mouth daily.    . clopidogrel (PLAVIX) 75 MG tablet Take 1 tablet (75 mg total) by mouth daily with breakfast. 90 tablet 2  . Cyanocobalamin (VITAMIN B-12 PO) Take 1 tablet by mouth daily.    Marland Kitchen docusate sodium 100 MG CAPS Take 100 mg by mouth 2 (two) times daily. 10 capsule 0  . esomeprazole (NEXIUM) 40 MG capsule Take 1 capsule (40 mg total) by mouth daily at 12 noon. 90 capsule 2  . furosemide (LASIX) 40 MG tablet Take 1 tablet (40 mg total) by mouth 2 (two) times daily. As directed by cardiologist 180 tablet 2  . glipiZIDE (GLUCOTROL XL) 2.5 MG 24 hr tablet Take 1 tablet (2.5 mg total) by mouth daily with breakfast. 30 tablet 1  . glucose blood (ONE TOUCH ULTRA TEST) test strip USE ONE STRIP TO CHECK BLOOD SUGAR ONCE DAILY AND PRN FOR DM 250.0 100 each 3  . lisinopril (PRINIVIL,ZESTRIL) 20 MG tablet Take 1 tablet (20 mg total) by mouth daily. 90 tablet 2  . metoprolol (LOPRESSOR) 50 MG tablet Take 1 tablet (50 mg total) by mouth 2 (two) times daily. 180 tablet 2  . nitroGLYCERIN (NITROSTAT) 0.4 MG SL tablet Place 1 tablet (0.4 mg total) under the tongue every 5 (five) minutes as needed. For chest pain and if not improved seek  medical attention in ER. 15 tablet 0  . ONETOUCH DELICA LANCETS 26R MISC Use as instructed to check blood sugar once daily or as needed.  250.00 100 each 3  . pravastatin (PRAVACHOL) 40 MG tablet Take 1 tablet (40 mg total) by mouth daily. 90 tablet 3  . sitaGLIPtin (JANUVIA) 100 MG tablet Take 1 tablet (100 mg total) by  mouth daily. 90 tablet 2  . warfarin (COUMADIN) 3 MG tablet Take 1 tablet (3 mg total) by mouth daily at 6 PM. Take as directed by the [facility]. 90 tablet 2   No current facility-administered medications on file prior to visit.      Review of Systems Review of Systems  Constitutional: Negative for fever, appetite change, fatigue and unexpected weight change.  Eyes: Negative for pain and visual disturbance.  Respiratory: Negative for cough and shortness of breath.   Cardiovascular: Negative for cp or palpitations    Gastrointestinal: Negative for nausea, diarrhea and constipation.  Genitourinary: Negative for urgency and frequency.  Skin: Negative for pallor or rash  pos for skin lesion on ear  Neurological: Negative for weakness, light-headedness, numbness and headaches.  Hematological: Negative for adenopathy. Does not bruise/bleed easily.  Psychiatric/Behavioral: Negative for dysphoric mood. The patient is not nervous/anxious.         Objective:   Physical Exam  Constitutional: She appears well-developed and well-nourished. No distress.  Well appearing elderly female  HENT:  Head: Normocephalic and atraumatic.  Mouth/Throat: Oropharynx is clear and moist.  Eyes: Conjunctivae and EOM are normal. Pupils are equal, round, and reactive to light. No scleral icterus.  Neck: Normal range of motion. Neck supple. No JVD present. Carotid bruit is not present. No thyromegaly present.  Cardiovascular: Normal rate, regular rhythm, normal heart sounds and intact distal pulses.  Exam reveals no gallop.   Pulmonary/Chest: Effort normal and breath sounds normal. No  respiratory distress. She has no wheezes. She exhibits no tenderness.  Abdominal: Soft. Bowel sounds are normal. She exhibits no distension, no abdominal bruit and no mass. There is no tenderness.  Musculoskeletal: Normal range of motion. She exhibits no edema or tenderness.  Lymphadenopathy:    She has no cervical adenopathy.  Neurological: She is alert. She has normal reflexes. No cranial nerve deficit. She exhibits normal muscle tone. Coordination normal.  Skin: Skin is warm and dry. No rash noted. No erythema. No pallor.  Lesion with scale on R pinna    Psychiatric: She has a normal mood and affect.          Assessment & Plan:   Problem List Items Addressed This Visit      Endocrine   DM2 (diabetes mellitus, type 2)    Lab Results  Component Value Date   HGBA1C 7.0* 11/05/2014   Pt still has low glucose in am even with reduced glipizide dose  On januvia  Will hold glipizde entirely  More comfortable with higher A1C to avoid hypoglycemia at her age  Continue to f/u udpate if change in blood glucose Good lifestyle habits         Musculoskeletal and Integument   Skin lesion - Primary    Firm area of scale on R pinna Ref to derm  Disc poss of early skin cancer       Relevant Orders   Ambulatory referral to Dermatology     Other   Vitamin D deficiency    D level 17.1 in elderly female with bone loss tx with high dose weekly D for 12 weeks Also 2000 iu daily indefinitely Continue to follow  Disc imp to overall and bone health

## 2014-11-20 NOTE — Patient Instructions (Addendum)
Stop the glipizide completely- your am blood sugars are a bit to low  Keep eating a healthy diet  Start weekly px vitamin D for 12 weeks  Take 2000 iu of vitamin D daily - and keep doing that every day indefinitely   Follow up in 3 months with labs prior   Stop at check out for referral to dermatology

## 2014-11-22 NOTE — Assessment & Plan Note (Signed)
Firm area of scale on R pinna Ref to derm  Disc poss of early skin cancer

## 2014-11-22 NOTE — Assessment & Plan Note (Signed)
D level 17.1 in elderly female with bone loss tx with high dose weekly D for 12 weeks Also 2000 iu daily indefinitely Continue to follow  Disc imp to overall and bone health

## 2014-11-22 NOTE — Assessment & Plan Note (Signed)
Lab Results  Component Value Date   HGBA1C 7.0* 11/05/2014   Pt still has low glucose in am even with reduced glipizide dose  On januvia  Will hold glipizde entirely  More comfortable with higher A1C to avoid hypoglycemia at her age  Continue to f/u udpate if change in blood glucose Good lifestyle habits

## 2014-12-08 ENCOUNTER — Other Ambulatory Visit: Payer: Self-pay | Admitting: *Deleted

## 2014-12-08 MED ORDER — GLUCOSE BLOOD VI STRP: ORAL_STRIP | Status: DC

## 2014-12-08 MED ORDER — ACCU-CHEK FASTCLIX LANCETS MISC: Status: AC

## 2014-12-08 MED ORDER — ACCU-CHEK AVIVA PLUS W/DEVICE KIT: PACK | Status: AC

## 2014-12-14 ENCOUNTER — Other Ambulatory Visit (INDEPENDENT_AMBULATORY_CARE_PROVIDER_SITE_OTHER): Payer: Commercial Managed Care - HMO

## 2014-12-14 DIAGNOSIS — I4891 Unspecified atrial fibrillation: Secondary | ICD-10-CM

## 2014-12-14 DIAGNOSIS — I4892 Unspecified atrial flutter: Secondary | ICD-10-CM | POA: Diagnosis not present

## 2014-12-14 DIAGNOSIS — Z5181 Encounter for therapeutic drug level monitoring: Secondary | ICD-10-CM | POA: Diagnosis not present

## 2014-12-14 LAB — POCT INR: INR: 3.4

## 2014-12-17 ENCOUNTER — Other Ambulatory Visit: Payer: Commercial Managed Care - HMO

## 2014-12-28 ENCOUNTER — Other Ambulatory Visit (INDEPENDENT_AMBULATORY_CARE_PROVIDER_SITE_OTHER): Payer: Commercial Managed Care - HMO

## 2014-12-28 DIAGNOSIS — Z5181 Encounter for therapeutic drug level monitoring: Secondary | ICD-10-CM | POA: Diagnosis not present

## 2014-12-28 DIAGNOSIS — I4892 Unspecified atrial flutter: Secondary | ICD-10-CM | POA: Diagnosis not present

## 2014-12-28 DIAGNOSIS — I4891 Unspecified atrial fibrillation: Secondary | ICD-10-CM

## 2014-12-28 LAB — POCT INR: INR: 3.8

## 2015-01-11 ENCOUNTER — Other Ambulatory Visit (INDEPENDENT_AMBULATORY_CARE_PROVIDER_SITE_OTHER): Payer: Commercial Managed Care - HMO

## 2015-01-11 DIAGNOSIS — I4891 Unspecified atrial fibrillation: Secondary | ICD-10-CM | POA: Diagnosis not present

## 2015-01-11 DIAGNOSIS — Z5181 Encounter for therapeutic drug level monitoring: Secondary | ICD-10-CM | POA: Diagnosis not present

## 2015-01-11 DIAGNOSIS — I4892 Unspecified atrial flutter: Secondary | ICD-10-CM

## 2015-01-11 LAB — POCT INR: INR: 3.4

## 2015-01-18 ENCOUNTER — Telehealth: Payer: Self-pay | Admitting: *Deleted

## 2015-01-18 NOTE — Telephone Encounter (Signed)
Bianca Mueller with Dr. Kenton Kingfisher dental office called because pt is having some dental work done and they need to know how to handle pt's Coumadin medication. Do you want pt to stop the med and if so how many days before the procedure and when should she restart it. Pt is having some cavities filled

## 2015-01-18 NOTE — Telephone Encounter (Signed)
Does the dentist think she needs to stop coumadin for that - generally do not for filling cavities

## 2015-01-18 NOTE — Telephone Encounter (Signed)
Left voicemail requesting Bianca Mueller from dentist office to call back and let us know if Dr. Kenton Kingfisher thinks she needs to stop coumadin for fillings

## 2015-01-19 NOTE — Telephone Encounter (Signed)
I am going to copy this to her cardiologist Dr Rockey Situ - to see what he has to add, not sure with her picture if she would need to be bridged on lovenox if she needed a tooth pulled (hopefully she will not)  Thanks

## 2015-01-19 NOTE — Telephone Encounter (Signed)
Dr. Kenton Kingfisher didn't think she needed to stop the coumadin just for the filling but he wanted to check to see if they do a cleaning where they might be getting close to her gums should they stop her coumadin and if so what are your directions, and also if they did end up needing to do an extraction on her what directions should they tell her about stopping her coumadin

## 2015-01-21 ENCOUNTER — Telehealth: Payer: Self-pay | Admitting: *Deleted

## 2015-01-21 NOTE — Telephone Encounter (Signed)
Would probably stay on warfarin,  Should be fine for cleaning and cavities.  If extraction needed, they would need to do another day as we would have to stop warfarin thx

## 2015-01-21 NOTE — Telephone Encounter (Signed)
Patient has been experiencing a constant runny nose for months now.  She has used Zyrtec in the past that worked well, however, now it does not.  She has tried Claritin and now Human resources officer, all to no avail.  Two questions: 1) This seems to have gotten out of control since her heart attack in [place]. Could some of her medications be causing this? 2) Would it be okay for her to try Flonase?

## 2015-01-21 NOTE — Telephone Encounter (Signed)
I do not think it is from any medication- I think flonase would be ok to try  Keep me posted

## 2015-01-22 NOTE — Telephone Encounter (Signed)
See note from cardiologist-thanks

## 2015-01-25 ENCOUNTER — Other Ambulatory Visit: Payer: Commercial Managed Care - HMO

## 2015-01-25 ENCOUNTER — Other Ambulatory Visit (INDEPENDENT_AMBULATORY_CARE_PROVIDER_SITE_OTHER): Payer: Commercial Managed Care - HMO

## 2015-01-25 DIAGNOSIS — I4891 Unspecified atrial fibrillation: Secondary | ICD-10-CM

## 2015-01-25 DIAGNOSIS — L570 Actinic keratosis: Secondary | ICD-10-CM | POA: Diagnosis not present

## 2015-01-25 DIAGNOSIS — Z5181 Encounter for therapeutic drug level monitoring: Secondary | ICD-10-CM

## 2015-01-25 DIAGNOSIS — D485 Neoplasm of uncertain behavior of skin: Secondary | ICD-10-CM | POA: Diagnosis not present

## 2015-01-25 DIAGNOSIS — I4892 Unspecified atrial flutter: Secondary | ICD-10-CM

## 2015-01-25 LAB — POCT INR: INR: 2.2

## 2015-01-25 NOTE — Telephone Encounter (Signed)
Bianca Mueller with dentist notified of Dr. Glori Bickers Dr. Donivan Scull comments/recommendations

## 2015-02-01 ENCOUNTER — Encounter: Payer: Self-pay | Admitting: *Deleted

## 2015-02-17 ENCOUNTER — Other Ambulatory Visit: Payer: Commercial Managed Care - HMO

## 2015-02-18 ENCOUNTER — Other Ambulatory Visit (INDEPENDENT_AMBULATORY_CARE_PROVIDER_SITE_OTHER): Payer: Commercial Managed Care - HMO

## 2015-02-18 DIAGNOSIS — E119 Type 2 diabetes mellitus without complications: Secondary | ICD-10-CM

## 2015-02-18 DIAGNOSIS — E559 Vitamin D deficiency, unspecified: Secondary | ICD-10-CM

## 2015-02-18 LAB — HEMOGLOBIN A1C: Hgb A1c MFr Bld: 7.1 % — ABNORMAL HIGH (ref 4.6–6.5)

## 2015-02-18 LAB — VITAMIN D 25 HYDROXY (VIT D DEFICIENCY, FRACTURES): VITD: 34.91 ng/mL (ref 30.00–100.00)

## 2015-02-22 ENCOUNTER — Other Ambulatory Visit: Payer: Commercial Managed Care - HMO

## 2015-02-22 ENCOUNTER — Ambulatory Visit: Payer: Commercial Managed Care - HMO | Admitting: Family Medicine

## 2015-02-26 ENCOUNTER — Encounter: Payer: Self-pay | Admitting: Family Medicine

## 2015-02-26 ENCOUNTER — Other Ambulatory Visit (INDEPENDENT_AMBULATORY_CARE_PROVIDER_SITE_OTHER): Payer: Commercial Managed Care - HMO

## 2015-02-26 ENCOUNTER — Ambulatory Visit (INDEPENDENT_AMBULATORY_CARE_PROVIDER_SITE_OTHER): Payer: Commercial Managed Care - HMO | Admitting: Family Medicine

## 2015-02-26 ENCOUNTER — Ambulatory Visit (INDEPENDENT_AMBULATORY_CARE_PROVIDER_SITE_OTHER)
Admission: RE | Admit: 2015-02-26 | Discharge: 2015-02-26 | Disposition: A | Discharge status: Home / Self Care | Payer: Commercial Managed Care - HMO | Source: Ambulatory Visit | Attending: Family Medicine | Admitting: Family Medicine

## 2015-02-26 VITALS — BP 132/80 | HR 53 | Temp 98.1°F | Ht 62.0 in | Wt 112.8 lb

## 2015-02-26 DIAGNOSIS — R059 Cough, unspecified: Secondary | ICD-10-CM

## 2015-02-26 DIAGNOSIS — I4891 Unspecified atrial fibrillation: Secondary | ICD-10-CM | POA: Diagnosis not present

## 2015-02-26 DIAGNOSIS — R05 Cough: Secondary | ICD-10-CM

## 2015-02-26 DIAGNOSIS — I4892 Unspecified atrial flutter: Secondary | ICD-10-CM | POA: Diagnosis not present

## 2015-02-26 DIAGNOSIS — Z5181 Encounter for therapeutic drug level monitoring: Secondary | ICD-10-CM | POA: Diagnosis not present

## 2015-02-26 DIAGNOSIS — E119 Type 2 diabetes mellitus without complications: Secondary | ICD-10-CM | POA: Diagnosis not present

## 2015-02-26 DIAGNOSIS — I10 Essential (primary) hypertension: Secondary | ICD-10-CM

## 2015-02-26 LAB — POCT INR: INR: 1.7

## 2015-02-26 MED ORDER — BENZONATATE 200 MG PO CAPS: 200.0000 mg | ORAL_CAPSULE | Freq: Three times a day (TID) | ORAL | Status: DC | PRN

## 2015-02-26 NOTE — Progress Notes (Signed)
Subjective:    Patient ID: Bianca Mueller, female    DOB: 1926/05/05, 81 y.o.   MRN: [id number]  HPI Here for f/u of chronic health problems  Overall feeling good   bp is stable today  No cp or palpitations or headaches or edema  No side effects to medicines  BP Readings from Last 3 Encounters:  02/26/15 132/80  11/20/14 136/70  11/18/14 140/70     Wt is up 1 lb with bmi of 20 She eats "too much"- hungry all the time (she avoids junk food or salty foods)   Diabetes- last visit stopped glipizide due to hypoglycemia  Home sugar results - better / no more hypoglycemia and highest is 130s  DM diet - same  Exercise -uses a machine/pedaler every day  Symptoms-none  A1C last  Lab Results  Component Value Date   HGBA1C 7.1* 02/18/2015  about stable from 7.0 Not much difference - since stopping glipizide   No problems with medications -on januvia alone Renal protection on ace  Last eye exam 7/15 - does not have an appointment   Still couging occ prod- yellow  Not actively wheezing No fever  Has tried allergy meds-flonase and antihistamine   No imp with robitussin    Used to smoke years ago     Patient Active Problem List   Diagnosis Date Noted  . Cough 02/26/2015  . Lightheaded 11/06/2014  . Leukocytosis 08/07/2014  . Acute on chronic diastolic heart failure 26/33/3545  . Encounter for therapeutic drug monitoring 08/06/2014  . Chronic diastolic CHF (congestive heart failure)   . CAD (coronary artery disease)   . Type II diabetes mellitus   . Hyperlipidemia   . Acute on chronic diastolic CHF (congestive heart failure), NYHA class 3 07/27/2014  . Atrial fibrillation   . Nausea without vomiting   . Transaminitis 07/20/2014  . Acute sinusitis 07/20/2014  . Atrial flutter 07/20/2014  . NSTEMI (non-ST elevated myocardial infarction) 07/18/2014  . Dyslipidemia 07/18/2014  . Acute bronchitis 07/15/2014  . Leg cramps 02/17/2014  . Encounter for Medicare annual  wellness exam 08/19/2013  . Skin lesion 03/16/2013  . Other screening mammogram 05/17/2012  . Adult failure to thrive 05/17/2011  . Palpitations 02/16/2011  . DM2 (diabetes mellitus, type 2) 02/14/2011  . ANEMIA, VITAMIN B12 DEFICIENCY 09/26/2010  . PEDAL EDEMA 03/16/2010  . [facility] 09/06/2009  . Vitamin D deficiency 03/02/2009  . DIVERTICULITIS, COLON 11/12/2008  . COLONIC POLYPS, HX OF 09/02/2008  . HYPERCHOLESTEROLEMIA 12/10/2006  . Essential hypertension 12/10/2006  . Osteoporosis 03/18/2002   Past Medical History  Diagnosis Date  . Osteoporosis   . Hyperlipidemia   . Hypertension   . Hypercalcemia   . CAD (coronary artery disease)     a. 1989 s/p MI, TPA, PTCA;  b. 06/2014 NSTEMI/Cath: LM nl, LAD 30-40, D2 90, small, LCX 166m OM1 90, RCA Cs2+ 70-80p, 50-624m90d (? ruptured plaque), EF 55-60%-->Med Rx.  . Marland Kitchenoster     past  . Diverticulosis   . Hx of adenomatous colonic polyps 2010    last colonscopy 2010,Dr ElVira Agar no need for further  colonoscopy given age  . PAF/PAFlutter     a. 06/2014->amio and coumadin started.  . Elevated LFTs     a. 06/2014 Abd U/S: no GB pathology.  . Chronic diastolic CHF (congestive heart failure)     a. 07/2014 Echo: EF 60-65%, Gr 1 DD, mild to mod TR, PASP 3760m.  . Myocardial infarction  1989  . Type II diabetes mellitus   . GERD (gastroesophageal reflux disease)   . Arthritis     "hips" (08/07/2014)   Past Surgical History  Procedure Laterality Date  . Vaginal hysterectomy  1970's?    ovaries intact  . Shoulder open rotator cuff repair Right 1980's  . Cardiac catheterization  07/22/2014  . Coronary angioplasty  1989    "@ [facility]"  . Left heart catheterization with coronary angiogram N/A 07/22/2014    Procedure: LEFT HEART CATHETERIZATION WITH CORONARY ANGIOGRAM;  Surgeon: Sinclair Grooms, MD;  Location: [facility] CATH LAB;  Service: Cardiovascular;  Laterality: N/A;   History  Substance Use Topics  . Smoking status: Former  Smoker -- 0.25 packs/day for 30 years    Types: Cigarettes  . Smokeless tobacco: Never Used     Comment: "quit smoking in the 1980's"  . Alcohol Use: No   Family History  Problem Relation Age of Onset  . Heart disease Mother     CAD  . Heart disease Father     CAD  . Heart disease Brother     CAD   Allergies  Allergen Reactions  . Alendronate Sodium   . Doxycycline Other (See Comments)    Inc LFT  . Isosorbide Other (See Comments)  . Metformin And Related     Decreases appetite resulting in weight loss  . Metronidazole     REACTION: sick/ GI  . Nifedipine Other (See Comments)   Current Outpatient Prescriptions on File Prior to Visit  Medication Sig Dispense Refill  . ACCU-CHEK FASTCLIX LANCETS MISC Use as instructed to test blood sugar once daily or as needed.  Diagnosis:  E11.9   Non insulin-dependent. 102 each 4  . amiodarone (PACERONE) 200 MG tablet Take 1 tablet (200 mg total) by mouth daily.    . Blood Glucose Monitoring Suppl (ACCU-CHEK AVIVA PLUS) W/DEVICE KIT Use as instructed to test blood sugar once daily or as needed.  Diagnosis:  E11.9  Non insulin-dependent. 1 kit 0  . cholecalciferol (VITAMIN D) 1000 UNITS tablet Take 2,000 Units by mouth daily.    . clopidogrel (PLAVIX) 75 MG tablet Take 1 tablet (75 mg total) by mouth daily with breakfast. 90 tablet 2  . Cyanocobalamin (VITAMIN B-12 PO) Take 1 tablet by mouth daily.    Marland Kitchen docusate sodium 100 MG CAPS Take 100 mg by mouth 2 (two) times daily. 10 capsule 0  . ergocalciferol (VITAMIN D2) 50000 UNITS capsule Take 1 capsule (50,000 Units total) by mouth once a week. 12 capsule 0  . esomeprazole (NEXIUM) 40 MG capsule Take 1 capsule (40 mg total) by mouth daily at 12 noon. 90 capsule 2  . furosemide (LASIX) 40 MG tablet Take 1 tablet (40 mg total) by mouth 2 (two) times daily. As directed by cardiologist 180 tablet 2  . glucose blood test strip Use as instructed to test blood sugar once daily or as needed.  Diagnosis:   E11.9   Non insulin-dependent.    Marland Kitchen glucose blood test strip Use as instructed to check blood sugar once daily or as needed.  Diagnosis:  E11.9   Non insulin-dependent. 100 each 4  . lisinopril (PRINIVIL,ZESTRIL) 20 MG tablet Take 1 tablet (20 mg total) by mouth daily. 90 tablet 2  . metoprolol (LOPRESSOR) 50 MG tablet Take 1 tablet (50 mg total) by mouth 2 (two) times daily. 180 tablet 2  . nitroGLYCERIN (NITROSTAT) 0.4 MG SL tablet Place 1 tablet (0.4 mg  total) under the tongue every 5 (five) minutes as needed. For chest pain and if not improved seek medical attention in ER. 15 tablet 0  . pravastatin (PRAVACHOL) 40 MG tablet Take 1 tablet (40 mg total) by mouth daily. 90 tablet 3  . sitaGLIPtin (JANUVIA) 100 MG tablet Take 1 tablet (100 mg total) by mouth daily. 90 tablet 2  . warfarin (COUMADIN) 3 MG tablet Take 1 tablet (3 mg total) by mouth daily at 6 PM. Take as directed by the [facility]. 90 tablet 2   No current facility-administered medications on file prior to visit.    Review of Systems Review of Systems  Constitutional: Negative for fever, appetite change, fatigue and unexpected weight change.  Eyes: Negative for pain and visual disturbance.  Respiratory: Negative for wheeze  and shortness of breath.   Cardiovascular: Negative for cp or palpitations    Gastrointestinal: Negative for nausea, diarrhea and constipation.  Genitourinary: Negative for urgency and frequency.  Skin: Negative for pallor or rash   Neurological: Negative for weakness, light-headedness, numbness and headaches.  Hematological: Negative for adenopathy. Does not bruise/bleed easily.  Psychiatric/Behavioral: Negative for dysphoric mood. The patient is not nervous/anxious.         Objective:   Physical Exam  Constitutional: She appears well-developed and well-nourished. No distress.  Slim and well appearing elderly female  HENT:  Head: Normocephalic and atraumatic.  Mouth/Throat: Oropharynx is clear  and moist.  Eyes: Conjunctivae and EOM are normal. Pupils are equal, round, and reactive to light.  Neck: Normal range of motion. Neck supple. No JVD present. Carotid bruit is not present. No thyromegaly present.  Cardiovascular: Normal rate, normal heart sounds and intact distal pulses.  Exam reveals no gallop.   Pulmonary/Chest: Effort normal and breath sounds normal. No respiratory distress. She has no wheezes. She has no rales.  No crackles  Abdominal: Soft. Bowel sounds are normal. She exhibits no distension, no abdominal bruit and no mass. There is no tenderness.  Musculoskeletal: She exhibits no edema.  Lymphadenopathy:    She has no cervical adenopathy.  Neurological: She is alert. She has normal reflexes.  Skin: Skin is warm and dry. No rash noted.  Psychiatric: She has a normal mood and affect.          Assessment & Plan:   Problem List Items Addressed This Visit    Cough    Ongoing irritative cough in former smoker  Re assuring exam  Given tessalon to stop the cough cycle  CXR today       Relevant Orders   DG Chest 2 View (Completed)   DM2 (diabetes mellitus, type 2)    Lab Results  Component Value Date   HGBA1C 7.1* 02/18/2015   This is stable without glipizide and no further hypoglycemia  Good diet and exercise habits       Encounter for therapeutic drug monitoring   Essential hypertension - Primary    bp in fair control at this time  BP Readings from Last 1 Encounters:  02/26/15 132/80   No changes needed Disc lifstyle change with low sodium diet and exercise  Labs reviewed

## 2015-02-26 NOTE — Patient Instructions (Signed)
Chest xray today for cough  Try tessalon pills as needed to control the cough  If fever or other symptoms let me know  BP and diabetes are stable -continue current medicines  Follow up in mid December for annual exam with labs prior

## 2015-02-28 NOTE — Assessment & Plan Note (Signed)
Lab Results  Component Value Date   HGBA1C 7.1* 02/18/2015   This is stable without glipizide and no further hypoglycemia  Good diet and exercise habits

## 2015-02-28 NOTE — Assessment & Plan Note (Signed)
Ongoing irritative cough in former smoker  Re assuring exam  Given tessalon to stop the cough cycle  CXR today

## 2015-02-28 NOTE — Assessment & Plan Note (Signed)
bp in fair control at this time  BP Readings from Last 1 Encounters:  02/26/15 132/80   No changes needed Disc lifstyle change with low sodium diet and exercise  Labs reviewed

## 2015-03-01 ENCOUNTER — Encounter: Payer: Self-pay | Admitting: Gastroenterology

## 2015-03-15 ENCOUNTER — Ambulatory Visit: Payer: Commercial Managed Care - HMO

## 2015-03-15 ENCOUNTER — Ambulatory Visit (INDEPENDENT_AMBULATORY_CARE_PROVIDER_SITE_OTHER): Payer: Commercial Managed Care - HMO | Admitting: *Deleted

## 2015-03-15 DIAGNOSIS — I4891 Unspecified atrial fibrillation: Secondary | ICD-10-CM | POA: Diagnosis not present

## 2015-03-15 DIAGNOSIS — I4892 Unspecified atrial flutter: Secondary | ICD-10-CM | POA: Diagnosis not present

## 2015-03-15 DIAGNOSIS — Z5181 Encounter for therapeutic drug level monitoring: Secondary | ICD-10-CM | POA: Diagnosis not present

## 2015-03-15 LAB — POCT INR: INR: 2.8

## 2015-03-15 NOTE — Progress Notes (Signed)
Pre visit review using our clinic review tool, if applicable. No additional management support is needed unless otherwise documented below in the visit note. 

## 2015-04-02 ENCOUNTER — Telehealth: Payer: Self-pay | Admitting: *Deleted

## 2015-04-02 DIAGNOSIS — E119 Type 2 diabetes mellitus without complications: Secondary | ICD-10-CM

## 2015-04-02 NOTE — Telephone Encounter (Signed)
Ref done Will route to [place] 

## 2015-04-02 NOTE — Telephone Encounter (Signed)
Patient needs a referral to eye MD for routine checkup because of insurance requirement.  Appt is August 2nd.

## 2015-04-12 ENCOUNTER — Ambulatory Visit (INDEPENDENT_AMBULATORY_CARE_PROVIDER_SITE_OTHER): Payer: Commercial Managed Care - HMO | Admitting: *Deleted

## 2015-04-12 DIAGNOSIS — Z5181 Encounter for therapeutic drug level monitoring: Secondary | ICD-10-CM

## 2015-04-12 DIAGNOSIS — I4892 Unspecified atrial flutter: Secondary | ICD-10-CM

## 2015-04-12 DIAGNOSIS — I4891 Unspecified atrial fibrillation: Secondary | ICD-10-CM | POA: Diagnosis not present

## 2015-04-12 LAB — POCT INR: INR: 4.5

## 2015-04-12 NOTE — Progress Notes (Signed)
Pre visit review using our clinic review tool, if applicable. No additional management support is needed unless otherwise documented below in the visit note. 

## 2015-04-19 LAB — HM DIABETES EYE EXAM

## 2015-04-20 ENCOUNTER — Encounter: Payer: Self-pay | Admitting: Family Medicine

## 2015-04-20 DIAGNOSIS — E119 Type 2 diabetes mellitus without complications: Secondary | ICD-10-CM | POA: Diagnosis not present

## 2015-04-20 DIAGNOSIS — H40003 Preglaucoma, unspecified, bilateral: Secondary | ICD-10-CM | POA: Diagnosis not present

## 2015-05-10 ENCOUNTER — Ambulatory Visit (INDEPENDENT_AMBULATORY_CARE_PROVIDER_SITE_OTHER): Payer: Medicare HMO | Admitting: *Deleted

## 2015-05-10 ENCOUNTER — Encounter: Payer: Self-pay | Admitting: *Deleted

## 2015-05-10 DIAGNOSIS — I4892 Unspecified atrial flutter: Secondary | ICD-10-CM | POA: Diagnosis not present

## 2015-05-10 DIAGNOSIS — I4891 Unspecified atrial fibrillation: Secondary | ICD-10-CM

## 2015-05-10 DIAGNOSIS — Z5181 Encounter for therapeutic drug level monitoring: Secondary | ICD-10-CM

## 2015-05-10 LAB — POCT INR: INR: 4.8

## 2015-05-10 NOTE — Progress Notes (Signed)
Pre visit review using our clinic review tool, if applicable. No additional management support is needed unless otherwise documented below in the visit note. 

## 2015-05-17 ENCOUNTER — Other Ambulatory Visit: Payer: Self-pay | Admitting: Family Medicine

## 2015-05-19 ENCOUNTER — Encounter: Payer: Self-pay | Admitting: Cardiovascular Disease

## 2015-05-19 ENCOUNTER — Ambulatory Visit (INDEPENDENT_AMBULATORY_CARE_PROVIDER_SITE_OTHER): Payer: Medicare HMO | Admitting: Cardiovascular Disease

## 2015-05-19 VITALS — BP 150/86 | HR 51 | Ht 62.0 in | Wt 114.5 lb

## 2015-05-19 DIAGNOSIS — E785 Hyperlipidemia, unspecified: Secondary | ICD-10-CM

## 2015-05-19 DIAGNOSIS — I10 Essential (primary) hypertension: Secondary | ICD-10-CM

## 2015-05-19 DIAGNOSIS — I5033 Acute on chronic diastolic (congestive) heart failure: Secondary | ICD-10-CM

## 2015-05-19 DIAGNOSIS — I2510 Atherosclerotic heart disease of native coronary artery without angina pectoris: Secondary | ICD-10-CM

## 2015-05-19 DIAGNOSIS — I4891 Unspecified atrial fibrillation: Secondary | ICD-10-CM

## 2015-05-19 DIAGNOSIS — I2583 Coronary atherosclerosis due to lipid rich plaque: Secondary | ICD-10-CM

## 2015-05-19 MED ORDER — METOPROLOL TARTRATE 25 MG PO TABS: 25.0000 mg | ORAL_TABLET | Freq: Two times a day (BID) | ORAL | Status: DC

## 2015-05-19 NOTE — Assessment & Plan Note (Signed)
Encouraged her to stay on her statin. Cholesterol slightly above goal

## 2015-05-19 NOTE — Progress Notes (Signed)
Patient ID: Bianca Mueller, female    DOB: Nov 23, 1925, 81 y.o.   MRN: [id number]  HPI Comments: 81 year old female with history of non-ST elevation MI complicated by paroxysmal atrial fibrillation and flutter who presents for follow-up of her coronary artery disease and atrial flutter In the hospital she had atrial fibrillation/flutter with RVR, started on diltiazem and amiodarone  cardiac catheterization showing severe diagonal, circumflex OM1, and RCA disease, medical management recommended Started on Coumadin and Plavix  follow-up in clinic she was still in atrial flutter with rate 86 bpm, normal sinus rhythm on her last clinic visit  In follow-up today, she reports that she is doing well. Daughter does most of her medications. She denies any symptoms of lightheadedness or dizziness. No chest pain concerning for angina  Denies having any significant shortness of breath.  takes Lasix once per day, rarely twice per day for high her weight. Previous lab work showing Vitamin D was low, hemoglobin A1c of 7.  EKG on today's visit shows normal sinus rhythm with rate 51 bpm, nonspecific ST abnormality  Other past medical history Previous 2-D echocardiogram showed normal LV function.   Allergies  Allergen Reactions  . Alendronate Sodium   . Doxycycline Other (See Comments)    Inc LFT  . Isosorbide Other (See Comments)  . Metformin And Related     Decreases appetite resulting in weight loss  . Metronidazole     REACTION: sick/ GI  . Nifedipine Other (See Comments)    Outpatient Encounter Prescriptions as of 05/19/2015  Medication Sig  . ACCU-CHEK FASTCLIX LANCETS MISC Use as instructed to test blood sugar once daily or as needed.  Diagnosis:  E11.9   Non insulin-dependent.  Marland Kitchen amiodarone (PACERONE) 200 MG tablet Take 1 tablet (200 mg total) by mouth daily.  . benzonatate (TESSALON) 200 MG capsule Take 1 capsule (200 mg total) by mouth 3 (three) times daily as needed for cough  (swallow pill whole, do not bite).  . Blood Glucose Monitoring Suppl (ACCU-CHEK AVIVA PLUS) W/DEVICE KIT Use as instructed to test blood sugar once daily or as needed.  Diagnosis:  E11.9  Non insulin-dependent.  . cholecalciferol (VITAMIN D) 1000 UNITS tablet Take 2,000 Units by mouth daily.  . clopidogrel (PLAVIX) 75 MG tablet Take 1 tablet (75 mg total) by mouth daily with breakfast.  . Cyanocobalamin (VITAMIN B-12 PO) Take 1 tablet by mouth daily.  Marland Kitchen docusate sodium 100 MG CAPS Take 100 mg by mouth 2 (two) times daily.  . ergocalciferol (VITAMIN D2) 50000 UNITS capsule Take 1 capsule (50,000 Units total) by mouth once a week.  . esomeprazole (NEXIUM) 40 MG capsule Take 1 capsule (40 mg total) by mouth daily at 12 noon.  . furosemide (LASIX) 40 MG tablet Take 1 tablet (40 mg total) by mouth 2 (two) times daily. As directed by cardiologist  . glucose blood test strip Use as instructed to test blood sugar once daily or as needed.  Diagnosis:  E11.9   Non insulin-dependent.  Marland Kitchen glucose blood test strip Use as instructed to check blood sugar once daily or as needed.  Diagnosis:  E11.9   Non insulin-dependent.  Marland Kitchen lisinopril (PRINIVIL,ZESTRIL) 20 MG tablet Take 1 tablet (20 mg total) by mouth daily.  . metoprolol (LOPRESSOR) 25 MG tablet Take 1 tablet (25 mg total) by mouth 2 (two) times daily.  . nitroGLYCERIN (NITROSTAT) 0.4 MG SL tablet Place 1 tablet (0.4 mg total) under the tongue every 5 (five) minutes as needed.  For chest pain and if not improved seek medical attention in ER.  . pravastatin (PRAVACHOL) 40 MG tablet Take 1 tablet (40 mg total) by mouth daily.  . sitaGLIPtin (JANUVIA) 100 MG tablet Take 1 tablet (100 mg total) by mouth daily.  Marland Kitchen warfarin (COUMADIN) 3 MG tablet Take 1 tablet (3 mg total) by mouth daily at 6 PM. Take as directed by the [facility].  . [DISCONTINUED] metoprolol (LOPRESSOR) 50 MG tablet Take 1 tablet (50 mg total) by mouth 2 (two) times daily.  . [DISCONTINUED]  JANUVIA 100 MG tablet TAKE ONE TABLET BY MOUTH ONCE DAILY (Patient not taking: Reported on 05/19/2015)   No facility-administered encounter medications on file as of 05/19/2015.    Past Medical History  Diagnosis Date  . Osteoporosis   . Hyperlipidemia   . Hypertension   . Hypercalcemia   . CAD (coronary artery disease)     a. 1989 s/p MI, TPA, PTCA;  b. 06/2014 NSTEMI/Cath: LM nl, LAD 30-40, D2 90, small, LCX 159m OM1 90, RCA Cs2+ 70-80p, 50-629m90d (? ruptured plaque), EF 55-60%-->Med Rx.  . Marland Kitchenoster     past  . Diverticulosis   . Hx of adenomatous colonic polyps 2010    last colonscopy 2010,Dr ElVira Agar no need for further  colonoscopy given age  . PAF/PAFlutter     a. 06/2014->amio and coumadin started.  . Elevated LFTs     a. 06/2014 Abd U/S: no GB pathology.  . Chronic diastolic CHF (congestive heart failure)     a. 07/2014 Echo: EF 60-65%, Gr 1 DD, mild to mod TR, PASP 3719m.  . Myocardial infarction 1989  . Type II diabetes mellitus   . GERD (gastroesophageal reflux disease)   . Arthritis     "hips" (08/07/2014)    Past Surgical History  Procedure Laterality Date  . Vaginal hysterectomy  1970's?    ovaries intact  . Shoulder open rotator cuff repair Right 1980's  . Cardiac catheterization  07/22/2014  . Coronary angioplasty  1989    "@ [facility]"  . Left heart catheterization with coronary angiogram N/A 07/22/2014    Procedure: LEFT HEART CATHETERIZATION WITH CORONARY ANGIOGRAM;  Surgeon: HenSinclair GroomsD;  [facility];  Service: Cardiovascular;  Laterality: N/A;    Social History  reports that she has quit smoking. Her smoking use included Cigarettes. She has a 7.5 pack-year smoking history. She has never used smokeless tobacco. She reports that she does not drink alcohol or use illicit drugs.  Family History family history includes Heart disease in her brother, father, and mother.    Review of Systems  Constitutional: Negative.   Eyes: Negative.    Respiratory: Negative.   Cardiovascular: Negative.   Gastrointestinal: Negative.   Musculoskeletal: Negative.   Neurological: Negative.   Hematological: Negative.   Psychiatric/Behavioral: Negative.   All other systems reviewed and are negative.  BP 150/86 mmHg  Pulse 51  Ht _0  (1.575 m)  Wt 114 lb 8 oz (51.937 kg)  BMI 20.94 kg/m2   Physical Exam  Constitutional: She is oriented to person, place, and time. She appears well-developed and well-nourished.  HENT:  Head: Normocephalic.  Nose: Nose normal.  Mouth/Throat: Oropharynx is clear and moist.  Eyes: Conjunctivae are normal. Pupils are equal, round, and reactive to light.  Neck: Normal range of motion. Neck supple. No JVD present.  Cardiovascular: Normal rate, regular rhythm, S1 normal, S2 normal, normal heart sounds and intact distal pulses.  Exam reveals no gallop and no friction rub.   No murmur heard. Pulmonary/Chest: Effort normal and breath sounds normal. No respiratory distress. She has no wheezes. She has no rales. She exhibits no tenderness.  Abdominal: Soft. Bowel sounds are normal. She exhibits no distension. There is no tenderness.  Musculoskeletal: Normal range of motion. She exhibits no edema or tenderness.  Lymphadenopathy:    She has no cervical adenopathy.  Neurological: She is alert and oriented to person, place, and time. Coordination normal.  Skin: Skin is warm and dry. No rash noted. No erythema.  Psychiatric: She has a normal mood and affect. Her behavior is normal. Judgment and thought content normal.    Assessment and Plan  Nursing note and vitals reviewed.

## 2015-05-19 NOTE — Assessment & Plan Note (Signed)
Currently with no symptoms of angina. No further workup at this time. Continue current medication regimen. 

## 2015-05-19 NOTE — Assessment & Plan Note (Signed)
Blood pressure mildly elevated Recommended she monitor this at home We will decrease metoprolol dose given her bradycardia, down to 25 mg twice a day metoprolol tartrate

## 2015-05-19 NOTE — Patient Instructions (Addendum)
You are doing well.  heart rate/beat is very slow (51 BPM) Please decrease the metoprolol down to 25 mg twice a day  (new script sent in, ok to cut 50 mg pills in 1/2 for now)  Wear the compression hose only for bad leg swelling (as needed)  Please call us if you have new issues that need to be addressed before your next appt.  Your physician wants you to follow-up in: 6 months.  You will receive a reminder letter in the mail two months in advance. If you don't receive a letter, please call our office to schedule the follow-up appointment.

## 2015-05-19 NOTE — Assessment & Plan Note (Signed)
Maintaining normal sinus rhythm. For bradycardia, we have decreased the metoprolol

## 2015-05-19 NOTE — Assessment & Plan Note (Signed)
She appears relatively euvolemic on today's visit. No changes to her medications apart from the decrease metoprolol

## 2015-05-27 ENCOUNTER — Ambulatory Visit (INDEPENDENT_AMBULATORY_CARE_PROVIDER_SITE_OTHER): Payer: Medicare HMO | Admitting: *Deleted

## 2015-05-27 DIAGNOSIS — Z5181 Encounter for therapeutic drug level monitoring: Secondary | ICD-10-CM | POA: Diagnosis not present

## 2015-05-27 DIAGNOSIS — I4892 Unspecified atrial flutter: Secondary | ICD-10-CM | POA: Diagnosis not present

## 2015-05-27 DIAGNOSIS — I4891 Unspecified atrial fibrillation: Secondary | ICD-10-CM

## 2015-05-27 LAB — POCT INR: INR: 3.1

## 2015-05-27 NOTE — Progress Notes (Signed)
Pre visit review using our clinic review tool, if applicable. No additional management support is needed unless otherwise documented below in the visit note. 

## 2015-06-08 ENCOUNTER — Other Ambulatory Visit: Payer: Self-pay | Admitting: Family Medicine

## 2015-06-24 ENCOUNTER — Ambulatory Visit (INDEPENDENT_AMBULATORY_CARE_PROVIDER_SITE_OTHER): Payer: Commercial Managed Care - HMO | Admitting: *Deleted

## 2015-06-24 DIAGNOSIS — Z23 Encounter for immunization: Secondary | ICD-10-CM

## 2015-06-24 DIAGNOSIS — I4891 Unspecified atrial fibrillation: Secondary | ICD-10-CM

## 2015-06-24 DIAGNOSIS — I4892 Unspecified atrial flutter: Secondary | ICD-10-CM

## 2015-06-24 DIAGNOSIS — Z5181 Encounter for therapeutic drug level monitoring: Secondary | ICD-10-CM

## 2015-06-24 LAB — POCT INR: INR: 2.5

## 2015-06-24 NOTE — Progress Notes (Signed)
Pre visit review using our clinic review tool, if applicable. No additional management support is needed unless otherwise documented below in the visit note. 

## 2015-08-05 ENCOUNTER — Ambulatory Visit (INDEPENDENT_AMBULATORY_CARE_PROVIDER_SITE_OTHER): Payer: Commercial Managed Care - HMO | Admitting: *Deleted

## 2015-08-05 DIAGNOSIS — I4891 Unspecified atrial fibrillation: Secondary | ICD-10-CM | POA: Diagnosis not present

## 2015-08-05 DIAGNOSIS — I4892 Unspecified atrial flutter: Secondary | ICD-10-CM | POA: Diagnosis not present

## 2015-08-05 DIAGNOSIS — Z5181 Encounter for therapeutic drug level monitoring: Secondary | ICD-10-CM | POA: Diagnosis not present

## 2015-08-05 LAB — POCT INR: INR: 2.1

## 2015-08-05 NOTE — Progress Notes (Signed)
Pre visit review using our clinic review tool, if applicable. No additional management support is needed unless otherwise documented below in the visit note. 

## 2015-08-12 ENCOUNTER — Other Ambulatory Visit: Payer: Self-pay | Admitting: Family Medicine

## 2015-08-13 ENCOUNTER — Encounter: Payer: Self-pay | Admitting: Family Medicine

## 2015-08-23 ENCOUNTER — Other Ambulatory Visit: Payer: Self-pay | Admitting: *Deleted

## 2015-08-23 MED ORDER — CLOPIDOGREL BISULFATE 75 MG PO TABS: 75.0000 mg | ORAL_TABLET | Freq: Every day | ORAL | Status: DC

## 2015-08-24 ENCOUNTER — Encounter: Payer: Self-pay | Admitting: Family Medicine

## 2015-08-24 ENCOUNTER — Ambulatory Visit (INDEPENDENT_AMBULATORY_CARE_PROVIDER_SITE_OTHER): Payer: Commercial Managed Care - HMO | Admitting: Family Medicine

## 2015-08-24 VITALS — BP 128/70 | HR 67 | Temp 98.8°F | Ht 62.0 in | Wt 118.5 lb

## 2015-08-24 DIAGNOSIS — H9193 Unspecified hearing loss, bilateral: Secondary | ICD-10-CM

## 2015-08-24 DIAGNOSIS — I10 Essential (primary) hypertension: Secondary | ICD-10-CM | POA: Diagnosis not present

## 2015-08-24 DIAGNOSIS — E7800 Pure hypercholesterolemia, unspecified: Secondary | ICD-10-CM

## 2015-08-24 DIAGNOSIS — E119 Type 2 diabetes mellitus without complications: Secondary | ICD-10-CM

## 2015-08-24 DIAGNOSIS — I5032 Chronic diastolic (congestive) heart failure: Secondary | ICD-10-CM

## 2015-08-24 DIAGNOSIS — H9190 Unspecified hearing loss, unspecified ear: Secondary | ICD-10-CM | POA: Insufficient documentation

## 2015-08-24 DIAGNOSIS — Z9181 History of falling: Secondary | ICD-10-CM

## 2015-08-24 DIAGNOSIS — S20219A Contusion of unspecified front wall of thorax, initial encounter: Secondary | ICD-10-CM | POA: Insufficient documentation

## 2015-08-24 DIAGNOSIS — S20212A Contusion of left front wall of thorax, initial encounter: Secondary | ICD-10-CM

## 2015-08-24 DIAGNOSIS — I5033 Acute on chronic diastolic (congestive) heart failure: Secondary | ICD-10-CM

## 2015-08-24 LAB — COMPREHENSIVE METABOLIC PANEL
ALT: 48 U/L — ABNORMAL HIGH (ref 0–35)
AST: 64 U/L — ABNORMAL HIGH (ref 0–37)
Albumin: 3.8 g/dL (ref 3.5–5.2)
Alkaline Phosphatase: 69 U/L (ref 39–117)
BUN: 12 mg/dL (ref 6–23)
CO2: 31 mEq/L (ref 19–32)
Calcium: 9.5 mg/dL (ref 8.4–10.5)
Chloride: 100 mEq/L (ref 96–112)
Creatinine, Ser: 0.79 mg/dL (ref 0.40–1.20)
GFR: 73.16 mL/min (ref >60.00)
Glucose, Bld: 178 mg/dL — ABNORMAL HIGH (ref 70–99)
Potassium: 3.9 mEq/L (ref 3.5–5.1)
Sodium: 140 mEq/L (ref 135–145)
Total Bilirubin: 0.6 mg/dL (ref 0.2–1.2)
Total Protein: 7.6 g/dL (ref 6.0–8.3)

## 2015-08-24 LAB — LIPID PANEL
CHOL/HDL RATIO: 4
Cholesterol: 150 mg/dL (ref 0–200)
HDL: 34.1 mg/dL — ABNORMAL LOW (ref >39.00)
LDL CALC: 77 mg/dL (ref 0–99)
NonHDL: 116.01
TRIGLYCERIDES: 196 mg/dL — AB (ref 0.0–149.0)
VLDL: 39.2 mg/dL (ref 0.0–40.0)

## 2015-08-24 LAB — HEMOGLOBIN A1C: Hgb A1c MFr Bld: 7.4 % — ABNORMAL HIGH (ref 4.6–6.5)

## 2015-08-24 NOTE — Patient Instructions (Signed)
For balance and fall prevention - use a walker if you feel unsteady and get help when you transition between surfaces We can refer you to physical therapy for balance at any time  If you want a referral for a hearing evaluation -let me know (I will run this by [place])- ears are clear today  Stop at check out to get your cardiology appointment Labs today

## 2015-08-24 NOTE — Progress Notes (Signed)
Subjective:    Patient ID: Bianca Mueller, female    DOB: [birth year], 81 y.o.   MRN: [id number]  HPI Here for f/u of chronic health problems   [place] 08/13/15 and contused her L side  Lost her balance getting out of the car  Sore over L chest wall now -getting better  No sob , but it hurts a bit to take a deep breath  No cough until she caught a cold  Unsure if she broke a rib   Declines use of walker or other device  Does not notice balance problems often    Wt is up 4 lb with bmi of 21 Is eating ok  At time ? If she is retaining fluid     This spring  -cardiologist inc her pravastatin to 40 mg  Lab Results  Component Value Date   CHOL 172 11/16/2014   HDL 41.20 11/16/2014   LDLCALC 41 07/19/2014   LDLDIRECT 102.0 11/16/2014   TRIG 270.0* 11/16/2014   CHOLHDL 4 11/16/2014   due for a check   Diabetes Home sugar results - up some , 130s  , no low glucose readings at all  DM diet -about the same/ good  Exercise - not moving as much /not as much exercise  Symptoms A1C last  Lab Results  Component Value Date   HGBA1C 7.1* 02/18/2015  suspects this may go up a bit   No problems with medications  Renal protection-on ace  Last eye exam 8/16  bp is stable today  No cp or palpitations or headaches or edema  No side effects to medicines  BP Readings from Last 3 Encounters:  08/24/15 128/70  05/19/15 150/86  02/26/15 132/80      hx of MI/ CAD and CHF Has appt with Dr Rockey Situ in Feb Per pt - does not move around as much because "she does not feel as good" No cp or sob  No more specific than that  Does not sleep well eigher    Hearing issues -she feels like her ears are stopped up   Patient Active Problem List   Diagnosis Date Noted  . Cough 02/26/2015  . Lightheaded 11/06/2014  . Leukocytosis 08/07/2014  . Acute on chronic diastolic heart failure ([place]) 08/07/2014  . Encounter for therapeutic drug monitoring 08/06/2014  . Chronic diastolic CHF  (congestive heart failure) ([place])   . CAD (coronary artery disease)   . Type II diabetes mellitus ([place])   . Hyperlipidemia   . Acute on chronic diastolic CHF (congestive heart failure), NYHA class 3 ([place]) 07/27/2014  . Atrial fibrillation ([place])   . Nausea without vomiting   . Transaminitis 07/20/2014  . Acute sinusitis 07/20/2014  . Atrial flutter ([place]) 07/20/2014  . NSTEMI (non-ST elevated myocardial infarction) ([place]) 07/18/2014  . Dyslipidemia 07/18/2014  . Acute bronchitis 07/15/2014  . Leg cramps 02/17/2014  . Encounter for Medicare annual wellness exam 08/19/2013  . Skin lesion 03/16/2013  . Other screening mammogram 05/17/2012  . Adult failure to thrive 05/17/2011  . Palpitations 02/16/2011  . DM2 (diabetes mellitus, type 2) ([place]) 02/14/2011  . ANEMIA, VITAMIN B12 DEFICIENCY 09/26/2010  . PEDAL EDEMA 03/16/2010  . [place] ALLERGY 09/06/2009  . Vitamin D deficiency 03/02/2009  . DIVERTICULITIS, COLON 11/12/2008  . COLONIC POLYPS, HX OF 09/02/2008  . HYPERCHOLESTEROLEMIA 12/10/2006  . Essential hypertension 12/10/2006  . Osteoporosis 03/18/2002   Past Medical History  Diagnosis Date  . Osteoporosis   . Hyperlipidemia   .  Hypertension   . Hypercalcemia   . CAD (coronary artery disease)     a. 1989 s/p MI, TPA, PTCA;  b. 06/2014 NSTEMI/Cath: LM nl, LAD 30-40, D2 90, small, LCX 148m OM1 90, RCA Cs2+ 70-80p, 50-68m90d (? ruptured plaque), EF 55-60%-->Med Rx.  . Marland Kitchenoster     past  . Diverticulosis   . Hx of adenomatous colonic polyps 2010    last colonscopy 2010,Dr ElVira Agar no need for further  colonoscopy given age  . PAF/PAFlutter     a. 06/2014->amio and coumadin started.  . Elevated LFTs     a. 06/2014 Abd U/S: no GB pathology.  . Chronic diastolic CHF (congestive heart failure) ([facility]    a. 07/2014 Echo: EF 60-65%, Gr 1 DD, mild to mod TR, PASP 3726m.  . Myocardial infarction ([facility]  . Type II diabetes mellitus ([facility] . GERD (gastroesophageal reflux  disease)   . Arthritis     "hips" (08/07/2014)   Past Surgical History  Procedure Laterality Date  . Vaginal hysterectomy  1970's?    ovaries intact  . Shoulder open rotator cuff repair Right 1980's  . Cardiac catheterization  07/22/2014  . Coronary angioplasty  1989    "@ [facility]"  . Left heart catheterization with coronary angiogram N/A 07/22/2014    Procedure: LEFT HEART CATHETERIZATION WITH CORONARY ANGIOGRAM;  Surgeon: HenSinclair GroomsD;  [facility];  Service: Cardiovascular;  Laterality: N/A;   Social History  Substance Use Topics  . Smoking status: Former Smoker -- 0.25 packs/day for 30 years    Types: Cigarettes  . Smokeless tobacco: Never Used     Comment: "quit smoking in the 1980's"  . Alcohol Use: No   Family History  Problem Relation Age of Onset  . Heart disease Mother     CAD  . Heart disease Father     CAD  . Heart disease Brother     CAD   Allergies  Allergen Reactions  . Alendronate Sodium   . Doxycycline Other (See Comments)    Inc LFT  . Isosorbide Other (See Comments)  . Metformin And Related     Decreases appetite resulting in weight loss  . Metronidazole     REACTION: sick/ GI  . Nifedipine Other (See Comments)   Current Outpatient Prescriptions on File Prior to Visit  Medication Sig Dispense Refill  . ACCU-CHEK FASTCLIX LANCETS MISC Use as instructed to test blood sugar once daily or as needed.  Diagnosis:  E11.9   Non insulin-dependent. 102 each 4  . amiodarone (PACERONE) 200 MG tablet Take 1 tablet (200 mg total) by mouth daily.    . benzonatate (TESSALON) 200 MG capsule Take 1 capsule (200 mg total) by mouth 3 (three) times daily as needed for cough (swallow pill whole, do not bite). 30 capsule 1  . Blood Glucose Monitoring Suppl (ACCU-CHEK AVIVA PLUS) W/DEVICE KIT Use as instructed to test blood sugar once daily or as needed.  Diagnosis:  E11.9  Non insulin-dependent. 1 kit 0  . cholecalciferol (VITAMIN D) 1000 UNITS tablet Take  2,000 Units by mouth daily.    . clopidogrel (PLAVIX) 75 MG tablet Take 1 tablet (75 mg total) by mouth daily. 90 tablet 3  . Cyanocobalamin (VITAMIN B-12 PO) Take 1 tablet by mouth daily.    . dMarland Kitchencusate sodium 100 MG CAPS Take 100 mg by mouth 2 (two) times daily. 10 capsule 0  . ergocalciferol (VITAMIN D2) 50000  UNITS capsule Take 1 capsule (50,000 Units total) by mouth once a week. 12 capsule 0  . esomeprazole (NEXIUM) 40 MG capsule Take 1 capsule (40 mg total) by mouth daily at 12 noon. 90 capsule 2  . furosemide (LASIX) 40 MG tablet Take 1 tablet (40 mg total) by mouth 2 (two) times daily. As directed by cardiologist 180 tablet 2  . glucose blood test strip Use as instructed to test blood sugar once daily or as needed.  Diagnosis:  E11.9   Non insulin-dependent.    Marland Kitchen glucose blood test strip Use as instructed to check blood sugar once daily or as needed.  Diagnosis:  E11.9   Non insulin-dependent. 100 each 4  . lisinopril (PRINIVIL,ZESTRIL) 20 MG tablet TAKE 1 TABLET EVERY DAY 90 tablet 2  . metoprolol (LOPRESSOR) 25 MG tablet Take 1 tablet (25 mg total) by mouth 2 (two) times daily. 180 tablet 3  . nitroGLYCERIN (NITROSTAT) 0.4 MG SL tablet Place 1 tablet (0.4 mg total) under the tongue every 5 (five) minutes as needed. For chest pain and if not improved seek medical attention in ER. 15 tablet 0  . pravastatin (PRAVACHOL) 40 MG tablet Take 1 tablet (40 mg total) by mouth daily. 90 tablet 3  . sitaGLIPtin (JANUVIA) 100 MG tablet Take 1 tablet (100 mg total) by mouth daily. 90 tablet 2  . warfarin (COUMADIN) 3 MG tablet Take 1 tablet (3 mg total) by mouth daily at 6 PM. Take as directed by the [facility]. 90 tablet 2   No current facility-administered medications on file prior to visit.      Review of Systems     Objective:   Physical Exam        Assessment & Plan:

## 2015-08-24 NOTE — Progress Notes (Signed)
Pre visit review using our clinic review tool, if applicable. No additional management support is needed unless otherwise documented below in the visit note. 

## 2015-08-26 ENCOUNTER — Telehealth: Payer: Self-pay | Admitting: *Deleted

## 2015-08-26 ENCOUNTER — Encounter: Payer: Self-pay | Admitting: Cardiovascular Disease

## 2015-08-26 ENCOUNTER — Ambulatory Visit (INDEPENDENT_AMBULATORY_CARE_PROVIDER_SITE_OTHER): Payer: Commercial Managed Care - HMO | Admitting: Cardiovascular Disease

## 2015-08-26 VITALS — BP 199/106 | HR 71 | Ht 61.0 in | Wt 117.0 lb

## 2015-08-26 DIAGNOSIS — I10 Essential (primary) hypertension: Secondary | ICD-10-CM | POA: Diagnosis not present

## 2015-08-26 DIAGNOSIS — I4891 Unspecified atrial fibrillation: Secondary | ICD-10-CM

## 2015-08-26 DIAGNOSIS — E1159 Type 2 diabetes mellitus with other circulatory complications: Secondary | ICD-10-CM

## 2015-08-26 DIAGNOSIS — I2583 Coronary atherosclerosis due to lipid rich plaque: Secondary | ICD-10-CM

## 2015-08-26 DIAGNOSIS — I214 Non-ST elevation (NSTEMI) myocardial infarction: Secondary | ICD-10-CM

## 2015-08-26 DIAGNOSIS — I2510 Atherosclerotic heart disease of native coronary artery without angina pectoris: Secondary | ICD-10-CM

## 2015-08-26 DIAGNOSIS — I5032 Chronic diastolic (congestive) heart failure: Secondary | ICD-10-CM

## 2015-08-26 DIAGNOSIS — E785 Hyperlipidemia, unspecified: Secondary | ICD-10-CM

## 2015-08-26 MED ORDER — CLONIDINE HCL 0.2 MG PO TABS: 0.2000 mg | ORAL_TABLET | Freq: Two times a day (BID) | ORAL | Status: DC

## 2015-08-26 MED ORDER — CLONIDINE HCL 0.1 MG PO TABS: 0.1000 mg | ORAL_TABLET | Freq: Two times a day (BID) | ORAL | Status: DC

## 2015-08-26 NOTE — Progress Notes (Signed)
Patient ID: Bianca Mueller, female    DOB: [birth year], 81 y.o.   MRN: [id number]  HPI Comments: 81 year old female with history of non-ST elevation MI complicated by paroxysmal atrial fibrillation and flutter who presents for follow-up of her coronary artery disease and atrial flutter. Prior non-ST elevation MI October 2015  Recent fall as she was getting out of her car in the garage Feels her feet slipped, did not have good balance, went down and hurt her ribs Denies any loss of consciousness, near syncope or dizziness  no further falls since that time Reports that her balance is okay, does not need a walker  Denies any stressors on today's visit other than her husband driving Blood pressure is very elevated even on recheck, 226 systolic Does not appear anxious, denies having any pain Otherwise reports she is doing well   note from her daughter provided todayReporting that the patient has not been feeling well lately,"been a decline since the fall after Thanksgiving" Unclear what she means by a decline, patient reports that she is okay. Daughter is not here to discuss new changes  EKG on today's visit shows normal sinus rhythm with rate 69 bpm,nonspecific ST abnormality  Orthostatics done in the office shows upper showed 223/111 supine, 205/113 standing, heart rate 70  Lab work reviewed with her showing mildly elevated AST ALT, hemoglobin A1c 7.4, total cholesterol 150  Other past medical history In the hospital she had atrial fibrillation/flutter with RVR, started on diltiazem and amiodarone  cardiac catheterization showing severe diagonal, circumflex OM1, and RCA disease, medical management recommended Started on Coumadin and Plavix  follow-up in clinic she was still in atrial flutter with rate 86 bpm, normal sinus rhythm on her last clinic visit  Previous 2-D echocardiogram showed normal LV function.   Allergies  Allergen Reactions  . Alendronate Sodium   . Doxycycline  Other (See Comments)    Inc LFT  . Isosorbide Other (See Comments)  . Metformin And Related     Decreases appetite resulting in weight loss  . Metronidazole     REACTION: sick/ GI  . Nifedipine Other (See Comments)    Outpatient Encounter Prescriptions as of 08/26/2015  Medication Sig  . ACCU-CHEK FASTCLIX LANCETS MISC Use as instructed to test blood sugar once daily or as needed.  Diagnosis:  E11.9   Non insulin-dependent.  Marland Kitchen amiodarone (PACERONE) 200 MG tablet Take 1 tablet (200 mg total) by mouth daily.  . Blood Glucose Monitoring Suppl (ACCU-CHEK AVIVA PLUS) W/DEVICE KIT Use as instructed to test blood sugar once daily or as needed.  Diagnosis:  E11.9  Non insulin-dependent.  . cholecalciferol (VITAMIN D) 1000 UNITS tablet Take 2,000 Units by mouth daily.  . clopidogrel (PLAVIX) 75 MG tablet Take 1 tablet (75 mg total) by mouth daily.  . Cyanocobalamin (VITAMIN B-12 PO) Take 1 tablet by mouth daily.  Marland Kitchen docusate sodium 100 MG CAPS Take 100 mg by mouth 2 (two) times daily.  . ergocalciferol (VITAMIN D2) 50000 UNITS capsule Take 1 capsule (50,000 Units total) by mouth once a week.  . esomeprazole (NEXIUM) 40 MG capsule Take 1 capsule (40 mg total) by mouth daily at 12 noon.  . furosemide (LASIX) 40 MG tablet Take 1 tablet (40 mg total) by mouth 2 (two) times daily. As directed by cardiologist (Patient taking differently: Take 40 mg by mouth 2 (two) times daily as needed. As directed by cardiologist)  . glucose blood test strip Use as instructed to test blood  sugar once daily or as needed.  Diagnosis:  E11.9   Non insulin-dependent.  Marland Kitchen glucose blood test strip Use as instructed to check blood sugar once daily or as needed.  Diagnosis:  E11.9   Non insulin-dependent.  Marland Kitchen lisinopril (PRINIVIL,ZESTRIL) 20 MG tablet TAKE 1 TABLET EVERY DAY  . metoprolol (LOPRESSOR) 25 MG tablet Take 1 tablet (25 mg total) by mouth 2 (two) times daily.  . nitroGLYCERIN (NITROSTAT) 0.4 MG SL tablet Place 1 tablet  (0.4 mg total) under the tongue every 5 (five) minutes as needed. For chest pain and if not improved seek medical attention in ER.  . pravastatin (PRAVACHOL) 40 MG tablet Take 1 tablet (40 mg total) by mouth daily.  . sitaGLIPtin (JANUVIA) 100 MG tablet Take 1 tablet (100 mg total) by mouth daily.  Marland Kitchen warfarin (COUMADIN) 3 MG tablet Take 1 tablet (3 mg total) by mouth daily at 6 PM. Take as directed by the [facility] Clinic.  . [DISCONTINUED] benzonatate (TESSALON) 200 MG capsule Take 1 capsule (200 mg total) by mouth 3 (three) times daily as needed for cough (swallow pill whole, do not bite). (Patient not taking: Reported on 08/26/2015)   No facility-administered encounter medications on file as of 08/26/2015.    Past Medical History  Diagnosis Date  . Osteoporosis   . Hyperlipidemia   . Hypertension   . Hypercalcemia   . CAD (coronary artery disease)     a. 1989 s/p MI, TPA, PTCA;  b. 06/2014 NSTEMI/Cath: LM nl, LAD 30-40, D2 90, small, LCX 147m OM1 90, RCA Cs2+ 70-80p, 50-668m90d (? ruptured plaque), EF 55-60%-->Med Rx.  . Marland Kitchenoster     past  . Diverticulosis   . Hx of adenomatous colonic polyps 2010    last colonscopy 2010,Dr ElVira Agar no need for further  colonoscopy given age  . PAF/PAFlutter     a. 06/2014->amio and coumadin started.  . Elevated LFTs     a. 06/2014 Abd U/S: no GB pathology.  . Chronic diastolic CHF (congestive heart failure) ([facility]    a. 07/2014 Echo: EF 60-65%, Gr 1 DD, mild to mod TR, PASP 376m.  . Myocardial infarction ([facility]  . Type II diabetes mellitus ([place] . GERD (gastroesophageal reflux disease)   . Arthritis     "hips" (08/07/2014)    Past Surgical History  Procedure Laterality Date  . Vaginal hysterectomy  1970's?    ovaries intact  . Shoulder open rotator cuff repair Right 1980's  . Cardiac catheterization  07/22/2014  . Coronary angioplasty  1989    "@ [facility]"  . Left heart catheterization with coronary angiogram N/A 07/22/2014     Procedure: LEFT HEART CATHETERIZATION WITH CORONARY ANGIOGRAM;  Surgeon: HenSinclair GroomsD;  [facility];  Service: Cardiovascular;  Laterality: N/A;    Social History  reports that she has quit smoking. Her smoking use included Cigarettes. She has a 7.5 pack-year smoking history. She has never used smokeless tobacco. She reports that she does not drink alcohol or use illicit drugs.  Family History family history includes Heart disease in her brother, father, and mother.   Review of Systems  Constitutional: Negative.   Respiratory: Negative.   Cardiovascular: Negative.   Gastrointestinal: Negative.   Musculoskeletal: Negative.   Neurological: Negative.   Hematological: Negative.   Psychiatric/Behavioral: Negative.   All other systems reviewed and are negative.  BP 199/106 mmHg  Pulse 71  Ht _0  (1.549 m)  Wt 117 lb (53.071 kg)  BMI 22.12 kg/m2   Physical Exam  Constitutional: She is oriented to person, place, and time. She appears well-developed and well-nourished.  HENT:  Head: Normocephalic.  Nose: Nose normal.  Mouth/Throat: Oropharynx is clear and moist.  Eyes: Conjunctivae are normal. Pupils are equal, round, and reactive to light.  Neck: Normal range of motion. Neck supple. No JVD present.  Cardiovascular: Normal rate, regular rhythm, S1 normal, S2 normal, normal heart sounds and intact distal pulses.  Exam reveals no gallop and no friction rub.   No murmur heard. Pulmonary/Chest: Effort normal and breath sounds normal. No respiratory distress. She has no wheezes. She has no rales. She exhibits no tenderness.  Abdominal: Soft. Bowel sounds are normal. She exhibits no distension. There is no tenderness.  Musculoskeletal: Normal range of motion. She exhibits no edema or tenderness.  Lymphadenopathy:    She has no cervical adenopathy.  Neurological: She is alert and oriented to person, place, and time. Coordination normal.  Skin: Skin is warm and dry. No  rash noted. No erythema.  Psychiatric: She has a normal mood and affect. Her behavior is normal. Judgment and thought content normal.    Assessment and Plan  Nursing note and vitals reviewed.

## 2015-08-26 NOTE — Assessment & Plan Note (Addendum)
Hemoglobin A1c elevated. She reports that she has not been eating well Recommended she stick with a strict diet

## 2015-08-26 NOTE — Assessment & Plan Note (Signed)
Currently with no symptoms of angina. No further workup at this time. Continue warfarin. We will hold the Plavix

## 2015-08-26 NOTE — Assessment & Plan Note (Signed)
Good control 128/70 No change in tx  Lab today

## 2015-08-26 NOTE — Patient Instructions (Addendum)
Blood pressure is very elevated today We will give clonidine 0.2 mg 1 and monitor your blood pressure: 142/78 @ 11:23am (ok to leave) If blood pressure improves after this medication, we will start clonidine 0.1 mg twice a day on a regular basis  Please hold the plavix Continue warfarin  Please call us if you have new issues that need to be addressed before your next appt.  Your physician wants you to follow-up in: 1 month.

## 2015-08-26 NOTE — Assessment & Plan Note (Signed)
Cholesterol is at goal on the current lipid regimen. No changes to the medications were made.  

## 2015-08-26 NOTE — Telephone Encounter (Signed)
Pt daughter calling she would like to leave message for Dr Rockey Situ For patient is coming today and she can't make it with her.  States Lately pt is not feeling well. There's been a decline and she had a fall day after thanksgiving. Did not pass out just does not understand why she had that.  This is the reason for the appointment.  She just did not want pt come in and say she is not sure why she is here.  Please take this into consideration when treating her.

## 2015-08-26 NOTE — Assessment & Plan Note (Signed)
She appears relatively euvolemic on today's visit No changes to her medications 

## 2015-08-26 NOTE — Assessment & Plan Note (Signed)
Pt states she does not feel as well after her MI  No sob on exertion and no edema  Wt is up 4 lb  Ref for f/u with cardiology

## 2015-08-26 NOTE — Assessment & Plan Note (Signed)
Maintaining normal sinus rhythm today. Recommended that she continue on her warfarin

## 2015-08-26 NOTE — Assessment & Plan Note (Signed)
Due for labs Pravastatin was inc to 40 mg by cardiology for more aggressive control  Diet is overall pretty good  Lipid panel today

## 2015-08-26 NOTE — Assessment & Plan Note (Signed)
[place] in Nov getting out of car and bruised her L side /ribs  Reassuring exam - and old ecchymosis is clearing  Disc imp of deep breaths to prevent atelectasis  Can use warm compresses  Nl gait and rom   Also long disc re: prev of falls  Recommended PT for balance-pt declined that for now  Disc imp of use of walker when needed - changing surfaces/uneven surfaces or when feeling tired or unsteady - pt voiced understanding

## 2015-08-26 NOTE — Assessment & Plan Note (Signed)
Recommended that she stay on her warfarin, stop the Plavix She's had Plavix for one year, likely does not need both given her age, recent falls, severe hypertension

## 2015-08-26 NOTE — Assessment & Plan Note (Signed)
Wt is up 4 lb  No edema  Pt states she just does not feel right since she had her MI  Ref for f/u from cardiology

## 2015-08-26 NOTE — Assessment & Plan Note (Addendum)
Very high blood pressure on today's visit, over [id number] systolic in a supine position, around 200 when upright even on my recheck. Denies any pain, no anxiety, no change in the way she feels. We will give her clonidine 0.2 mg 1, monitor her in clinic until blood pressure starts to improve If she responds to clonidine, could start the clonidine 0.1 mg twice a day with close monitoring of blood pressure. We'll try to contact her daughter   addendum: Blood pressure significant improved on to [id number] systolic after she was given clonidine 0.2 mg 1 We will give her a prescription for clonidine 0.1 mg twice a day She will call us daily with her blood pressures for the next week

## 2015-08-26 NOTE — Telephone Encounter (Signed)
Printed for Dr. Rockey Situ to review before he goes in the room w/ pt.

## 2015-08-26 NOTE — Assessment & Plan Note (Signed)
No cerumen on exam today Pt is not advocating for a formal hearing eval and unsure if she would wear hearing aides  She will disc this with her daughter

## 2015-08-26 NOTE — Assessment & Plan Note (Signed)
From fall in NOV Symptoms are improving -old ecchymosis seen  May have rib contusion or healing fx - but exam is reassuring Disc imp of deep breaths to prevent atelectasis and warm compresses Will continue to follow

## 2015-08-26 NOTE — Assessment & Plan Note (Signed)
A1C today Pt states blood sugar has been up a bit  No hypoglycemia  No complications  Lab today

## 2015-08-27 ENCOUNTER — Telehealth: Payer: Self-pay | Admitting: *Deleted

## 2015-08-27 NOTE — Telephone Encounter (Signed)
Pt calling stating she was to call with her Bp reading every day.   168/92 today it is.  Please advise

## 2015-08-27 NOTE — Telephone Encounter (Signed)
Spoke w/ pt.  She reports that she has not taken clonidine yet, as her daughter has not picked up from pharmacy and brought to her yet.  Advised her that her BP is too high and the clonidine will bring it down.  She states that her daughter will not be there until tonight, so she will have her husband take her to pharmacy and go ahead and pick it up. Advised her to take 1 pill as soon as she gets it and call if her numbers do not go down.  Otherwise, she will call back on Monday w/ readings over the weekend.

## 2015-08-30 ENCOUNTER — Telehealth: Payer: Self-pay

## 2015-08-30 NOTE — Telephone Encounter (Signed)
Pt calling w BP readings: 12/10-168/93, later in day 140/78 12/12-158/97, just now, 130/78

## 2015-08-30 NOTE — Telephone Encounter (Signed)
Would make sure she is taking clonidine 0.1 mg twice a day Would suggest she moved the evening clonidine later into the evening and continue to monitor numbers

## 2015-08-30 NOTE — Telephone Encounter (Signed)
Pt calling w/ daily BP readings. She picked up clonidine on Friday and started that afternoon.

## 2015-08-31 NOTE — Telephone Encounter (Signed)
Spoke w/ pt.  She reports BP this am 153/90. She has been taking her clonidine 0.1 mg as soon as she gets up, as her am numbers are very high. She usually goes to bed around 9 pm, takes pm dose right before she goes to bed. She states that she previously used a clonidine patch w/ good results and asks if this is an option. Advised her that I will make Dr. Rockey Situ aware of her request and call her back w/ his recommendation.

## 2015-09-01 ENCOUNTER — Telehealth: Payer: Self-pay | Admitting: *Deleted

## 2015-09-01 NOTE — Telephone Encounter (Signed)
Spoke w/ [name].  Advised her of Dr. Donivan Scull recommendation.  She will have pt keep clonidine next to her bed to take right before she goes to sleep and then as soon as she gets up in the am.  Advised her to have pt keep a log of BP readings about 2-3 times day for 5 days and then bring this in instead of calling every day.  She is agreeable and will report readings next week.

## 2015-09-01 NOTE — Telephone Encounter (Signed)
Pt c/o BP issue: STAT if pt c/o blurred vision, one-sided weakness or slurred speech - right side feeling week  1. What are your last 5 BP readings? 09/01/15 8:45am 200/95   2. Are you having any other symptoms (ex. Dizziness, headache, blurred vision, passed out)? Headache last night  3. What is your BP issue? Worried it was high this morning, please call patient.

## 2015-09-01 NOTE — Telephone Encounter (Signed)
Pt calling to give in her BP readings.  She reports this morning when she woke up it was  200/90 Just took her medication and it has come down a little.  123/78 is the current reading.

## 2015-09-01 NOTE — Telephone Encounter (Signed)
Please see previous phone note.  

## 2015-09-01 NOTE — Telephone Encounter (Signed)
Traci L Tekely at 09/01/2015 1:47 PM             Pt c/o BP issue: STAT if pt c/o blurred vision, one-sided weakness or slurred speech - right side feeling week  1. What are your last 5 BP readings? 09/01/15 8:45am 200/95   2. Are you having any other symptoms (ex. Dizziness, headache, blurred vision, passed out)? Headache last night  3. What is your BP issue? Worried it was high this morning, please call patient.

## 2015-09-01 NOTE — Telephone Encounter (Signed)
Would check pressures several times per day, write them down, need numbers for at least one to 2 weeks If always elevated in the morning, Again would take clonidine first thing when she wakes up May need 0.2 mg right before bed Need numbers during the daytime  Once we figure out the right dosing, clonidine patch could be called in

## 2015-09-16 ENCOUNTER — Ambulatory Visit (INDEPENDENT_AMBULATORY_CARE_PROVIDER_SITE_OTHER): Payer: Commercial Managed Care - HMO | Admitting: *Deleted

## 2015-09-16 DIAGNOSIS — I4892 Unspecified atrial flutter: Secondary | ICD-10-CM

## 2015-09-16 DIAGNOSIS — Z5181 Encounter for therapeutic drug level monitoring: Secondary | ICD-10-CM

## 2015-09-16 DIAGNOSIS — I4891 Unspecified atrial fibrillation: Secondary | ICD-10-CM

## 2015-09-16 LAB — POCT INR: INR: 2.6

## 2015-09-16 NOTE — Progress Notes (Signed)
Pre visit review using our clinic review tool, if applicable. No additional management support is needed unless otherwise documented below in the visit note. 

## 2015-09-18 ENCOUNTER — Encounter (HOSPITAL_COMMUNITY): Payer: Self-pay | Admitting: Emergency Medicine

## 2015-09-18 ENCOUNTER — Emergency Department (HOSPITAL_COMMUNITY)
Admission: EM | Admit: 2015-09-18 | Discharge: 2015-09-18 | Disposition: A | Discharge status: Home / Self Care | Payer: Commercial Managed Care - HMO | Attending: Emergency Medicine | Admitting: Emergency Medicine

## 2015-09-18 ENCOUNTER — Emergency Department (HOSPITAL_COMMUNITY): Payer: Commercial Managed Care - HMO

## 2015-09-18 DIAGNOSIS — Y9389 Activity, other specified: Secondary | ICD-10-CM | POA: Diagnosis not present

## 2015-09-18 DIAGNOSIS — Z87891 Personal history of nicotine dependence: Secondary | ICD-10-CM | POA: Insufficient documentation

## 2015-09-18 DIAGNOSIS — Z79899 Other long term (current) drug therapy: Secondary | ICD-10-CM | POA: Insufficient documentation

## 2015-09-18 DIAGNOSIS — I252 Old myocardial infarction: Secondary | ICD-10-CM | POA: Diagnosis not present

## 2015-09-18 DIAGNOSIS — X58XXXA Exposure to other specified factors, initial encounter: Secondary | ICD-10-CM | POA: Insufficient documentation

## 2015-09-18 DIAGNOSIS — E785 Hyperlipidemia, unspecified: Secondary | ICD-10-CM | POA: Insufficient documentation

## 2015-09-18 DIAGNOSIS — S0990XA Unspecified injury of head, initial encounter: Secondary | ICD-10-CM | POA: Diagnosis not present

## 2015-09-18 DIAGNOSIS — R55 Syncope and collapse: Secondary | ICD-10-CM | POA: Insufficient documentation

## 2015-09-18 DIAGNOSIS — Y9289 Other specified places as the place of occurrence of the external cause: Secondary | ICD-10-CM | POA: Diagnosis not present

## 2015-09-18 DIAGNOSIS — S0083XA Contusion of other part of head, initial encounter: Secondary | ICD-10-CM | POA: Insufficient documentation

## 2015-09-18 DIAGNOSIS — R001 Bradycardia, unspecified: Secondary | ICD-10-CM | POA: Insufficient documentation

## 2015-09-18 DIAGNOSIS — M1990 Unspecified osteoarthritis, unspecified site: Secondary | ICD-10-CM | POA: Diagnosis not present

## 2015-09-18 DIAGNOSIS — E119 Type 2 diabetes mellitus without complications: Secondary | ICD-10-CM | POA: Insufficient documentation

## 2015-09-18 DIAGNOSIS — Y998 Other external cause status: Secondary | ICD-10-CM | POA: Diagnosis not present

## 2015-09-18 DIAGNOSIS — I5032 Chronic diastolic (congestive) heart failure: Secondary | ICD-10-CM | POA: Insufficient documentation

## 2015-09-18 DIAGNOSIS — Z86010 Personal history of colonic polyps: Secondary | ICD-10-CM | POA: Diagnosis not present

## 2015-09-18 DIAGNOSIS — I2510 Atherosclerotic heart disease of native coronary artery without angina pectoris: Secondary | ICD-10-CM | POA: Diagnosis not present

## 2015-09-18 DIAGNOSIS — K219 Gastro-esophageal reflux disease without esophagitis: Secondary | ICD-10-CM | POA: Diagnosis not present

## 2015-09-18 DIAGNOSIS — I10 Essential (primary) hypertension: Secondary | ICD-10-CM | POA: Diagnosis not present

## 2015-09-18 LAB — URINALYSIS, ROUTINE W REFLEX MICROSCOPIC
BILIRUBIN URINE: NEGATIVE
Glucose, UA: 100 mg/dL — AB
HGB URINE DIPSTICK: NEGATIVE
KETONES UR: NEGATIVE mg/dL
Nitrite: NEGATIVE
PROTEIN: NEGATIVE mg/dL
Specific Gravity, Urine: 1.01 (ref 1.005–1.030)
pH: 7 (ref 5.0–8.0)

## 2015-09-18 LAB — CBC
HEMATOCRIT: 45.8 % (ref 36.0–46.0)
HEMOGLOBIN: 14.9 g/dL (ref 12.0–15.0)
MCH: 29.3 pg (ref 26.0–34.0)
MCHC: 32.5 g/dL (ref 30.0–36.0)
MCV: 90 fL (ref 78.0–100.0)
Platelets: 199 10*3/uL (ref 150–400)
RBC: 5.09 MIL/uL (ref 3.87–5.11)
RDW: 14.1 % (ref 11.5–15.5)
WBC: 7 10*3/uL (ref 4.0–10.5)

## 2015-09-18 LAB — URINE MICROSCOPIC-ADD ON: BACTERIA UA: NONE SEEN

## 2015-09-18 LAB — TROPONIN I: Troponin I: <0.03 ng/mL (ref <0.031)

## 2015-09-18 LAB — BASIC METABOLIC PANEL
ANION GAP: 11 (ref 5–15)
BUN: 12 mg/dL (ref 6–20)
CHLORIDE: 100 mmol/L — AB (ref 101–111)
CO2: 29 mmol/L (ref 22–32)
Calcium: 9.8 mg/dL (ref 8.9–10.3)
Creatinine, Ser: 0.88 mg/dL (ref 0.44–1.00)
GFR, EST NON AFRICAN AMERICAN: 57 mL/min — AB (ref >60)
Glucose, Bld: 199 mg/dL — ABNORMAL HIGH (ref 65–99)
POTASSIUM: 4.6 mmol/L (ref 3.5–5.1)
SODIUM: 140 mmol/L (ref 135–145)

## 2015-09-18 LAB — PROTIME-INR
INR: 0.9 (ref 0.00–1.49)
PROTHROMBIN TIME: 12.4 s (ref 11.6–15.2)

## 2015-09-18 MED ORDER — CLONIDINE HCL 0.1 MG PO TABS: 0.1000 mg | ORAL_TABLET | Freq: Once | ORAL | Status: DC

## 2015-09-18 MED ORDER — METOPROLOL TARTRATE 25 MG PO TABS: 12.5000 mg | ORAL_TABLET | Freq: Two times a day (BID) | ORAL | Status: DC

## 2015-09-18 MED ORDER — ACETAMINOPHEN 325 MG PO TABS
650.0000 mg | ORAL_TABLET | Freq: Once | ORAL | Status: AC
  Administered 2015-09-18: 650 mg via ORAL
  Filled 2015-09-18: qty 2

## 2015-09-18 MED ORDER — LISINOPRIL 20 MG PO TABS
20.0000 mg | ORAL_TABLET | Freq: Once | ORAL | Status: AC
  Administered 2015-09-18: 20 mg via ORAL
  Filled 2015-09-18: qty 1

## 2015-09-18 NOTE — ED Notes (Signed)
This nurse found discharge prescription and instructions on bench outside of ED door.  Called pt to ask if she had any questions, pt asked that I call her daughter, [name] @ [phone] to clarify.  Called Lugene who verbalized discharge instructions.  Discharge prescription and instructions put in shredder box.

## 2015-09-18 NOTE — ED Notes (Signed)
Dr. Steinl MD at bedside. 

## 2015-09-18 NOTE — ED Provider Notes (Signed)
CSN: [id number]     Arrival date & time 09/18/15  1216 History   First MD Initiated Contact with Patient 09/18/15 1257     Chief Complaint  Patient presents with  . Hypertension  . Near Syncope     (Consider location/radiation/quality/duration/timing/severity/associated sxs/prior Treatment) Patient is a 81 y.o. female presenting with hypertension and near-syncope. The history is provided by the patient and a relative.  Hypertension Pertinent negatives include no chest pain, no abdominal pain, no headaches and no shortness of breath.  Near Syncope Pertinent negatives include no chest pain, no abdominal pain, no headaches and no shortness of breath.  Patient w hx afib, cad, htn, presents after syncopal event at home tonight. Had gotten up, in kitchen to fix food for spouse, got faint/lightheaded feeling and then briefly passed out.  No hx same. Contusion to forehead. No lac. Is on coumadin. Denies headache. No neck or back pain. Denies injury or pain from fall.  No palpitations. No current or recent chest pain or discomfort. No cough or uri c/o. No abd pain. No nvd. No dysuria or gu c/o. No recent blood loss or melena. No recent change in meds, except clonidine was add as bp high a couple months ago. Had been told hr slow in past and bblocker dose had been decreased, but not recently.  No fever or chills.       Past Medical History  Diagnosis Date  . Osteoporosis   . Hyperlipidemia   . Hypertension   . Hypercalcemia   . CAD (coronary artery disease)     a. 1989 s/p MI, TPA, PTCA;  b. 06/2014 NSTEMI/Cath: LM nl, LAD 30-40, D2 90, small, LCX 100m, OM1 90, RCA Cs2+ 70-80p, 50-60m, 90d (? ruptured plaque), EF 55-60%-->Med Rx.  . Zoster     past  . Diverticulosis   . Hx of adenomatous colonic polyps 2010    last colonscopy 2010,Dr Elliott - no need for further  colonoscopy given age  . PAF/PAFlutter     a. 06/2014->amio and coumadin started.  . Elevated LFTs     a. 06/2014 Abd U/S: no  GB pathology.  . Chronic diastolic CHF (congestive heart failure) (HCC)     a. 07/2014 Echo: EF 60-65%, Gr 1 DD, mild to mod TR, PASP 37mmHg.  . Myocardial infarction (HCC) 1989  . Type II diabetes mellitus (HCC)   . GERD (gastroesophageal reflux disease)   . Arthritis     "hips" (08/07/2014)   Past Surgical History  Procedure Laterality Date  . Vaginal hysterectomy  1970's?    ovaries intact  . Shoulder open rotator cuff repair Right 1980's  . Cardiac catheterization  07/22/2014  . Coronary angioplasty  1989    "@ [facility]"  . Left heart catheterization with coronary angiogram N/A 07/22/2014    Procedure: LEFT HEART CATHETERIZATION WITH CORONARY ANGIOGRAM;  Surgeon: Henry W Smith III, MD;  Location: [facility]ATH [facility];  Service: Cardiovascular;  Laterality: N/A;   Family History  Problem Relation Age of Onset  . Heart disease Mother     CAD  . Heart disease Father     CAD  . Heart disease Brother     CAD   Social History  Substance Use Topics  . Smoking status: Former Smoker -- 0.25 packs/day for 30 years    Types: Cigarettes  . Smokeless tobacco: Never Used     Comment: "quit smoking in the 1980's"  . Alcohol Use: No   OB History      No data available     Review of Systems  Constitutional: Negative for fever and chills.  HENT: Negative for sore throat.   Eyes: Negative for visual disturbance.  Respiratory: Negative for cough and shortness of breath.   Cardiovascular: Positive for near-syncope. Negative for chest pain.  Gastrointestinal: Negative for vomiting, abdominal pain, diarrhea and blood in stool.  Genitourinary: Negative for dysuria and flank pain.  Musculoskeletal: Negative for back pain and neck pain.  Skin: Negative for rash.  Neurological: Negative for headaches.  Hematological: Does not bruise/bleed easily.  Psychiatric/Behavioral: Negative for confusion.      Allergies  Alendronate sodium; Doxycycline; Isosorbide; Metformin and related; Metronidazole; and  Nifedipine  Home Medications   Prior to Admission medications   Medication Sig Start Date End Date Taking? Authorizing Provider  ACCU-CHEK FASTCLIX LANCETS MISC Use as instructed to test blood sugar once daily or as needed.  Diagnosis:  E11.9   Non insulin-dependent. 12/08/14   Marne A Tower, MD  amiodarone (PACERONE) 200 MG tablet Take 1 tablet (200 mg total) by mouth daily. 11/05/14   Graham S Duncan, MD  Blood Glucose Monitoring Suppl (ACCU-CHEK AVIVA PLUS) W/DEVICE KIT Use as instructed to test blood sugar once daily or as needed.  Diagnosis:  E11.9  Non insulin-dependent. 12/08/14   Marne A Tower, MD  cholecalciferol (VITAMIN D) 1000 UNITS tablet Take 2,000 Units by mouth daily.    Historical Provider, MD  cloNIDine (CATAPRES) 0.1 MG tablet Take 1 tablet (0.1 mg total) by mouth 2 (two) times daily. 08/26/15   Timothy J Gollan, MD  cloNIDine (CATAPRES) 0.2 MG tablet Take 0.2 mg by mouth once. Pt given 1 tab in exam room at 10:00am due to SBP > 200 per Dr. Gollan.  Pt relaxing in room, husband is present.    Historical Provider, MD  clopidogrel (PLAVIX) 75 MG tablet Take 1 tablet (75 mg total) by mouth daily. 08/23/15   Marne A Tower, MD  Cyanocobalamin (VITAMIN B-12 PO) Take 1 tablet by mouth daily.    Historical Provider, MD  docusate sodium 100 MG CAPS Take 100 mg by mouth 2 (two) times daily. 07/29/14   Rhonda G Barrett, PA-C  ergocalciferol (VITAMIN D2) 50000 UNITS capsule Take 1 capsule (50,000 Units total) by mouth once a week. 11/20/14   Marne A Tower, MD  esomeprazole (NEXIUM) 40 MG capsule Take 1 capsule (40 mg total) by mouth daily at 12 noon. 09/24/14   Marne A Tower, MD  furosemide (LASIX) 40 MG tablet Take 1 tablet (40 mg total) by mouth 2 (two) times daily. As directed by cardiologist Patient taking differently: Take 40 mg by mouth 2 (two) times daily as needed. As directed by cardiologist 09/24/14   Marne A Tower, MD  glucose blood test strip Use as instructed to test blood sugar once  daily or as needed.  Diagnosis:  E11.9   Non insulin-dependent.    Historical Provider, MD  glucose blood test strip Use as instructed to check blood sugar once daily or as needed.  Diagnosis:  E11.9   Non insulin-dependent. 12/08/14   Marne A Tower, MD  lisinopril (PRINIVIL,ZESTRIL) 20 MG tablet TAKE 1 TABLET EVERY DAY 06/08/15   Marne A Tower, MD  metoprolol (LOPRESSOR) 25 MG tablet Take 1 tablet (25 mg total) by mouth 2 (two) times daily. 05/19/15   Timothy J Gollan, MD  nitroGLYCERIN (NITROSTAT) 0.4 MG SL tablet Place 1 tablet (0.4 mg total) under the tongue every 5 (five) minutes as   needed. For chest pain and if not improved seek medical attention in ER. 07/03/13   Marne A Tower, MD  pravastatin (PRAVACHOL) 40 MG tablet Take 1 tablet (40 mg total) by mouth daily. 11/18/14   Timothy J Gollan, MD  sitaGLIPtin (JANUVIA) 100 MG tablet Take 1 tablet (100 mg total) by mouth daily. 09/24/14   Marne A Tower, MD  warfarin (COUMADIN) 3 MG tablet Take 1 tablet (3 mg total) by mouth daily at 6 PM. Take as directed by the [facility]. 09/24/14   Marne A Tower, MD   BP 211/113 mmHg  Pulse 57  Temp(Src) 98.6 F (37 C) (Oral)  Resp 16  Ht 5' 1" (1.549 m)  Wt 51.71 kg  BMI 21.55 kg/m2  SpO2 100% Physical Exam  Constitutional: She is oriented to person, place, and time. She appears well-developed and well-nourished. No distress.  HENT:  Contusion to forehead.   Eyes: Conjunctivae are normal. Pupils are equal, round, and reactive to light. No scleral icterus.  Neck: Normal range of motion. Neck supple. No tracheal deviation present.  No bruit  Cardiovascular: Normal rate, regular rhythm, normal heart sounds and intact distal pulses.   Pulmonary/Chest: Effort normal and breath sounds normal. No respiratory distress. She exhibits no tenderness.  Abdominal: Soft. Normal appearance and bowel sounds are normal. She exhibits no distension. There is no tenderness.  Genitourinary:  No cva tenderness   Musculoskeletal: She exhibits no edema.  CTLS spine, non tender, aligned, no step off. Good rom bil ext without pain or focal bony tenderness.   Neurological: She is alert and oriented to person, place, and time.  Speech clear/fluent. Motor intact bil. Steady gait.   Skin: Skin is warm and dry. No rash noted. She is not diaphoretic.  Psychiatric: She has a normal mood and affect.  Nursing note and vitals reviewed.   ED Course  Procedures (including critical care time) Labs Review  Results for orders placed or performed during the hospital encounter of 09/18/15  Basic metabolic panel  Result Value Ref Range   Sodium 140 135 - 145 mmol/L   Potassium 4.6 3.5 - 5.1 mmol/L   Chloride 100 (L) 101 - 111 mmol/L   CO2 29 22 - 32 mmol/L   Glucose, Bld 199 (H) 65 - 99 mg/dL   BUN 12 6 - 20 mg/dL   Creatinine, Ser 0.88 0.44 - 1.00 mg/dL   Calcium 9.8 8.9 - 10.3 mg/dL   GFR calc non Af Amer 57 (L) >60 mL/min   GFR calc Af Amer >60 >60 mL/min   Anion gap 11 5 - 15  CBC  Result Value Ref Range   WBC 7.0 4.0 - 10.5 K/uL   RBC 5.09 3.87 - 5.11 MIL/uL   Hemoglobin 14.9 12.0 - 15.0 g/dL   HCT 45.8 36.0 - 46.0 %   MCV 90.0 78.0 - 100.0 fL   MCH 29.3 26.0 - 34.0 pg   MCHC 32.5 30.0 - 36.0 g/dL   RDW 14.1 11.5 - 15.5 %   Platelets 199 150 - 400 K/uL  Urinalysis, Routine w reflex microscopic (not at [facility])  Result Value Ref Range   Color, Urine YELLOW YELLOW   APPearance CLEAR CLEAR   Specific Gravity, Urine 1.010 1.005 - 1.030   pH 7.0 5.0 - 8.0   Glucose, UA 100 (A) NEGATIVE mg/dL   Hgb urine dipstick NEGATIVE NEGATIVE   Bilirubin Urine NEGATIVE NEGATIVE   Ketones, ur NEGATIVE NEGATIVE mg/dL   Protein, ur   NEGATIVE NEGATIVE mg/dL   Nitrite NEGATIVE NEGATIVE   Leukocytes, UA TRACE (A) NEGATIVE  Protime-INR  Result Value Ref Range   Prothrombin Time 12.4 11.6 - 15.2 seconds   INR 0.90 0.00 - 1.49  Troponin I  Result Value Ref Range   Troponin I <0.03 <0.031 ng/mL  Urine  microscopic-add on  Result Value Ref Range   Squamous Epithelial / LPF 0-5 (A) NONE SEEN   WBC, UA 0-5 0 - 5 WBC/hpf   RBC / HPF 0-5 0 - 5 RBC/hpf   Bacteria, UA NONE SEEN NONE SEEN   Ct Head Wo Contrast  09/18/2015  CLINICAL DATA:  Slipped and fell, striking back of the head EXAM: CT HEAD WITHOUT CONTRAST TECHNIQUE: Contiguous axial images were obtained from the base of the skull through the vertex without intravenous contrast. COMPARISON:  None. FINDINGS: Global atrophy. Chronic ischemic changes in the periventricular white matter. No mass effect, midline shift, or acute hemorrhage. Cranium is intact. Mastoid air cells are clear. There are air-fluid levels in the bilateral maxillary sinuses. Mucosal thickening in the ethmoid air cells bilaterally. IMPRESSION: No acute intracranial pathology. Inflammatory changes in the paranasal sinuses are noted. Electronically Signed   By: Marybelle Killings M.D.   On: 09/18/2015 14:32       I have personally reviewed and evaluated these images and lab results as part of my medical decision-making.   EKG Interpretation   Date/Time:  Saturday September 18 2015 12:51:43 EST Ventricular Rate:  57 PR Interval:  164 QRS Duration: 68 QT Interval:  492 QTC Calculation: 478 R Axis:   53 Text Interpretation:  Sinus bradycardia Nonspecific ST abnormality  Confirmed by Ashok Cordia  MD, Lennette Bihari ([id number]) on 09/18/2015 12:58:45 PM      MDM   Iv ns. Continuous pulse ox and monitor. Labs. Ct.    Reviewed nursing notes and prior charts for additional history.   Pt ambulates about ED with steady gait.  No faintness or dizziness.  No dysrhythmia while on monitor, w exception sinus brady, hr low as 50.    No recurrent faintness or dizziness. Pt denies any chest pain or discomfort.   Pt given po fluids, happy meal.   pts bp is elevated. Given her normal dose of her bp med.  Pt did already take first dose of her clonidine today.  ?whether relative bradycardia related  to earlier syncope, pt is noted on metoprolol. Will decrease dose for now, and rec close pcp follow up.   Pt remains asymptomatic, and indicates feels ready for d/c to home.         Lajean Saver, MD 09/18/15 [phone]

## 2015-09-18 NOTE — Discharge Instructions (Signed)
It was our pleasure to provide your ER care today - we hope that you feel better.  Rest. Drink adequate fluids.  As your heart rate is slow, decrease the dose of your metoprolol to 12.5 mg (instead of 25 mg).  Follow up with your doctor for recheck, including recheck of blood pressure, in the next few days - call office Monday or Tuesday morning to arrange follow up appointment.   If you begin to feel lightheaded, swimmy-headed, or as if you could faint - sit or lie down immediately.  Return to ER if worse, new symptoms, fevers, chest pain, trouble breathing, weak/fainting, other concern.      Syncope Syncope is a medical term for fainting or passing out. This means you lose consciousness and drop to the ground. People are generally unconscious for less than 5 minutes. You may have some muscle twitches for up to 15 seconds before waking up and returning to normal. Syncope occurs more often in older adults, but it can happen to anyone. While most causes of syncope are not dangerous, syncope can be a sign of a serious medical problem. It is important to seek medical care.  CAUSES  Syncope is caused by a sudden drop in blood flow to the brain. The specific cause is often not determined. Factors that can bring on syncope include:  Taking medicines that lower blood pressure.  Sudden changes in posture, such as standing up quickly.  Taking more medicine than prescribed.  Standing in one place for too long.  Seizure disorders.  Dehydration and excessive exposure to heat.  Low blood sugar (hypoglycemia).  Straining to have a bowel movement.  Heart disease, irregular heartbeat, or other circulatory problems.  Fear, emotional distress, seeing blood, or severe pain. SYMPTOMS  Right before fainting, you may:  Feel dizzy or light-headed.  Feel nauseous.  See all white or all black in your field of vision.  Have cold, clammy skin. DIAGNOSIS  Your health care provider will ask  about your symptoms, perform a physical exam, and perform an electrocardiogram (ECG) to record the electrical activity of your heart. Your health care provider may also perform other heart or blood tests to determine the cause of your syncope which may include:  Transthoracic echocardiogram (TTE). During echocardiography, sound waves are used to evaluate how blood flows through your heart.  Transesophageal echocardiogram (TEE).  Cardiac monitoring. This allows your health care provider to monitor your heart rate and rhythm in real time.  Holter monitor. This is a portable device that records your heartbeat and can help diagnose heart arrhythmias. It allows your health care provider to track your heart activity for several days, if needed.  Stress tests by exercise or by giving medicine that makes the heart beat faster. TREATMENT  In most cases, no treatment is needed. Depending on the cause of your syncope, your health care provider may recommend changing or stopping some of your medicines. HOME CARE INSTRUCTIONS  Have someone stay with you until you feel stable.  Do not drive, use machinery, or play sports until your health care provider says it is okay.  Keep all follow-up appointments as directed by your health care provider.  Lie down right away if you start feeling like you might faint. Breathe deeply and steadily. Wait until all the symptoms have passed.  Drink enough fluids to keep your urine clear or pale yellow.  If you are taking blood pressure or heart medicine, get up slowly and take several minutes to  sit and then stand. This can reduce dizziness. SEEK IMMEDIATE MEDICAL CARE IF:   You have a severe headache.  You have unusual pain in the chest, abdomen, or back.  You are bleeding from your mouth or rectum, or you have black or tarry stool.  You have an irregular or very fast heartbeat.  You have pain with breathing.  You have repeated fainting or seizure-like  jerking during an episode.  You faint when sitting or lying down.  You have confusion.  You have trouble walking.  You have severe weakness.  You have vision problems. If you fainted, call your local emergency services (911 in [place] Do not drive yourself to the hospital.    This information is not intended to replace advice given to you by your health care provider. Make sure you discuss any questions you have with your health care provider.   Document Released: 09/04/2005 Document Revised: 01/19/2015 Document Reviewed: 11/03/2011 Elsevier Interactive Patient Education 2016 [employer]   Bradycardia Bradycardia is a slower-than-normal heart rate. A normal resting heart rate for an adult ranges from 60 to 100 beats per minute. With bradycardia, the resting heart rate is less than 60 beats per minute. Bradycardia is a problem if your heart cannot pump enough oxygen-rich blood through your body. Bradycardia is not a problem for everyone. For some healthy adults, a slow resting heart rate is normal.  CAUSES  Bradycardia may be caused by:  A problem with the heart's electrical system, such as heart block.  A problem with the heart's natural pacemaker (sinus node).  Heart disease, damage, or infection.  Certain medicines that treat heart conditions.  Certain conditions, such as hypothyroidism and obstructive sleep apnea. RISK FACTORS  Risk factors include:  Being 41 or older.  Having high blood pressure (hypertension), high cholesterol (hyperlipidemia), or diabetes.  Drinking heavily, using tobacco products, or using drugs.  Being stressed. SIGNS AND SYMPTOMS  Signs and symptoms include:  Light-headedness.  Faintingor near fainting.  Fatigue and weakness.  Shortness of breath.  Chest pain (angina).  Drowsiness.  Confusion.  Dizziness. DIAGNOSIS  Diagnosis of bradycardia may include:  A physical exam.  An electrocardiogram (ECG).  Blood  tests. TREATMENT  Treatment for bradycardia may include:  Treatment of an underlying condition.  Pacemaker placement. A pacemaker is a small, battery-powered device that is placed under the skin and is programmed to sense your heartbeats. If your heart rate is lower than the programmed rate, the pacemaker will pace your heart.  Changing your medicines or dosages. HOME CARE INSTRUCTIONS  Take medicines only as directed by your health care provider.  Manage any health conditions that contribute to bradycardia as directed by your health care provider.  Follow a heart-healthy diet. A dietitian can help educate you on healthy food options and changes.  Follow an exercise program approved by your health care provider.  Maintain a healthy weight. Lose weight as approved by your health care provider.  Do not use tobacco products, including cigarettes, chewing tobacco, or electronic cigarettes. If you need help quitting, ask your health care provider.  Do not use illegal drugs.  Limit alcohol intake to no more than 1 drink per day for nonpregnant women and 2 drinks per day for men. One drink equals 12 ounces of beer, 5 ounces of wine, or 1 ounces of hard liquor.  Keep all follow-up visits as directed by your health care provider. This is important. SEEK MEDICAL CARE IF:  You feel light-headed or  dizzy.  You almost faint.  You feel weak or are easily fatigued during physical activity.  You experience confusion or have memory problems. SEEK IMMEDIATE MEDICAL CARE IF:   You faint.  You have an irregular heartbeat.  You have chest pain.  You have trouble breathing. MAKE SURE YOU:   Understand these instructions.  Will watch your condition.  Will get help right away if you are not doing well or get worse.   This information is not intended to replace advice given to you by your health care provider. Make sure you discuss any questions you have with your health care  provider.   Document Released: 05/27/2002 Document Revised: 09/25/2014 Document Reviewed: 12/10/2013 Elsevier Interactive Patient Education 2016 [employer].   Hypertension Hypertension, commonly called high blood pressure, is when the force of blood pumping through your arteries is too strong. Your arteries are the blood vessels that carry blood from your heart throughout your body. A blood pressure reading consists of a higher number over a lower number, such as 110/72. The higher number (systolic) is the pressure inside your arteries when your heart pumps. The lower number (diastolic) is the pressure inside your arteries when your heart relaxes. Ideally you want your blood pressure below 120/80. Hypertension forces your heart to work harder to pump blood. Your arteries may become narrow or stiff. Having untreated or uncontrolled hypertension can cause heart attack, stroke, kidney disease, and other problems. RISK FACTORS Some risk factors for high blood pressure are controllable. Others are not.  Risk factors you cannot control include:   Race. You may be at higher risk if you are African American.  Age. Risk increases with age.  Gender. Men are at higher risk than women before age 65 years. After age 27, women are at higher risk than men. Risk factors you can control include:  Not getting enough exercise or physical activity.  Being overweight.  Getting too much fat, sugar, calories, or salt in your diet.  Drinking too much alcohol. SIGNS AND SYMPTOMS Hypertension does not usually cause signs or symptoms. Extremely high blood pressure (hypertensive crisis) may cause headache, anxiety, shortness of breath, and nosebleed. DIAGNOSIS To check if you have hypertension, your health care provider will measure your blood pressure while you are seated, with your arm held at the level of your heart. It should be measured at least twice using the same arm. Certain conditions can cause a  difference in blood pressure between your right and left arms. A blood pressure reading that is higher than normal on one occasion does not mean that you need treatment. If it is not clear whether you have high blood pressure, you may be asked to return on a different day to have your blood pressure checked again. Or, you may be asked to monitor your blood pressure at home for 1 or more weeks. TREATMENT Treating high blood pressure includes making lifestyle changes and possibly taking medicine. Living a healthy lifestyle can help lower high blood pressure. You may need to change some of your habits. Lifestyle changes may include:  Following the DASH diet. This diet is high in fruits, vegetables, and whole grains. It is low in salt, red meat, and added sugars.  Keep your sodium intake below 2,300 mg per day.  Getting at least 30-45 minutes of aerobic exercise at least 4 times per week.  Losing weight if necessary.  Not smoking.  Limiting alcoholic beverages.  Learning ways to reduce stress. Your health care  provider may prescribe medicine if lifestyle changes are not enough to get your blood pressure under control, and if one of the following is true:  You are 72-56 years of age and your systolic blood pressure is above 140.  You are 41 years of age or older, and your systolic blood pressure is above 150.  Your diastolic blood pressure is above 90.  You have diabetes, and your systolic blood pressure is over [id number] or your diastolic blood pressure is over 90.  You have kidney disease and your blood pressure is above 140/90.  You have heart disease and your blood pressure is above 140/90. Your personal target blood pressure may vary depending on your medical conditions, your age, and other factors. HOME CARE INSTRUCTIONS  Have your blood pressure rechecked as directed by your health care provider.   Take medicines only as directed by your health care provider. Follow the directions  carefully. Blood pressure medicines must be taken as prescribed. The medicine does not work as well when you skip doses. Skipping doses also puts you at risk for problems.  Do not smoke.   Monitor your blood pressure at home as directed by your health care provider. SEEK MEDICAL CARE IF:   You think you are having a reaction to medicines taken.  You have recurrent headaches or feel dizzy.  You have swelling in your ankles.  You have trouble with your vision. SEEK IMMEDIATE MEDICAL CARE IF:  You develop a severe headache or confusion.  You have unusual weakness, numbness, or feel faint.  You have severe chest or abdominal pain.  You vomit repeatedly.  You have trouble breathing. MAKE SURE YOU:   Understand these instructions.  Will watch your condition.  Will get help right away if you are not doing well or get worse.   This information is not intended to replace advice given to you by your health care provider. Make sure you discuss any questions you have with your health care provider.   Document Released: 09/04/2005 Document Revised: 01/19/2015 Document Reviewed: 06/27/2013 Elsevier Interactive Patient Education [employer].

## 2015-09-18 NOTE — ED Notes (Signed)
Daughter stated, she said her BP was high and she passed out this morning.

## 2015-09-18 NOTE — ED Notes (Signed)
Pt's daughter expressed concerns about continued hypertension.  Dr. Kathrynn Humble made aware.

## 2015-09-21 ENCOUNTER — Encounter: Payer: Self-pay | Admitting: Family Medicine

## 2015-09-21 ENCOUNTER — Telehealth: Payer: Self-pay | Admitting: Cardiovascular Disease

## 2015-09-21 ENCOUNTER — Ambulatory Visit (INDEPENDENT_AMBULATORY_CARE_PROVIDER_SITE_OTHER): Payer: Commercial Managed Care - HMO | Admitting: Family Medicine

## 2015-09-21 VITALS — BP 140/84 | HR 54 | Temp 98.3°F | Ht 61.0 in | Wt 115.2 lb

## 2015-09-21 DIAGNOSIS — R001 Bradycardia, unspecified: Secondary | ICD-10-CM | POA: Diagnosis not present

## 2015-09-21 DIAGNOSIS — R269 Unspecified abnormalities of gait and mobility: Secondary | ICD-10-CM

## 2015-09-21 DIAGNOSIS — R55 Syncope and collapse: Secondary | ICD-10-CM | POA: Insufficient documentation

## 2015-09-21 DIAGNOSIS — I10 Essential (primary) hypertension: Secondary | ICD-10-CM

## 2015-09-21 NOTE — Assessment & Plan Note (Signed)
Pt c/o vague problems walking- difficult for her to describe  Can fatigue easily after 5-10 minutes- req small wheeled walker with seat-px provided and eval done  She has slow shuffling gait that is wide based No s/s of parkinsons-will watch for bradykinesia Consider neruo eval if needed CT of head was reviewed

## 2015-09-21 NOTE — Assessment & Plan Note (Signed)
Rev ED notes/labs/studies from 09/19/15 when she was seen after syncopal episode at home  Her Beta blocker was cut in 1/2 -and pulse is currently in the 50s Per pt bp is up at home (after recent start on clonidine)- but she feels ok overall  Today BP: 140/84 mmHg   Will arrange f/u with cardiology - unsure if syncope may be due to episodes of bradycardia

## 2015-09-21 NOTE — Assessment & Plan Note (Signed)
Was newly started on clonidine by cardiology Beta blocker was cut in 1/2 by ED after syncopal episode BP: 140/84 mmHg  Per pt-higher at home Arranged cardiology f/u

## 2015-09-21 NOTE — Patient Instructions (Signed)
Take the px for the walker to any medical supply company  Use it when needed  See cardiology as planned- stop up front to get an appointment  If you feel faint-always sit or lie down as quickly as possible  If headaches or other symptoms let me know  If walking problems worsen please also let me know

## 2015-09-21 NOTE — Telephone Encounter (Signed)
Error

## 2015-09-21 NOTE — Assessment & Plan Note (Signed)
?   If cause of syncope Ref to cardiology  Metoprolol was cut in ED

## 2015-09-21 NOTE — Progress Notes (Signed)
Pre visit review using our clinic review tool, if applicable. No additional management support is needed unless otherwise documented below in the visit note. 

## 2015-09-21 NOTE — Progress Notes (Signed)
Subjective:    Patient ID: Bianca Mueller, female    DOB: 1926-08-26, 82 y.o.   MRN: [id number]  HPI  Here for f/u of episode of syncope 09/19/15 in ED  She was walking to the kitchen and woke up on the floor  Had felt light headed before that  Was out just a few minutes   bp was high (recently began clonidine in cardiol) Pulse was low in 50s Had forehead contusion-no other injuries  (pt denies this saying it was the back of her head not the front)  Denies HA or other symptoms Buttocks are sore   Ct Head Wo Contrast  09/18/2015  CLINICAL DATA:  Slipped and fell, striking back of the head EXAM: CT HEAD WITHOUT CONTRAST TECHNIQUE: Contiguous axial images were obtained from the base of the skull through the vertex without intravenous contrast. COMPARISON:  None. FINDINGS: Global atrophy. Chronic ischemic changes in the periventricular white matter. No mass effect, midline shift, or acute hemorrhage. Cranium is intact. Mastoid air cells are clear. There are air-fluid levels in the bilateral maxillary sinuses. Mucosal thickening in the ethmoid air cells bilaterally. IMPRESSION: No acute intracranial pathology. Inflammatory changes in the paranasal sinuses are noted. Electronically Signed   By: Marybelle Killings M.D.   On: 09/18/2015 14:32    Results for orders placed or performed during the hospital encounter of 70/35/00  Basic metabolic panel  Result Value Ref Range   Sodium 140 135 - 145 mmol/L   Potassium 4.6 3.5 - 5.1 mmol/L   Chloride 100 (L) 101 - 111 mmol/L   CO2 29 22 - 32 mmol/L   Glucose, Bld 199 (H) 65 - 99 mg/dL   BUN 12 6 - 20 mg/dL   Creatinine, Ser 0.88 0.44 - 1.00 mg/dL   Calcium 9.8 8.9 - 10.3 mg/dL   GFR calc non Af Amer 57 (L) >60 mL/min   GFR calc Af Amer >60 >60 mL/min   Anion gap 11 5 - 15  CBC  Result Value Ref Range   WBC 7.0 4.0 - 10.5 K/uL   RBC 5.09 3.87 - 5.11 MIL/uL   Hemoglobin 14.9 12.0 - 15.0 g/dL   HCT 45.8 36.0 - 46.0 %   MCV 90.0 78.0 - 100.0 fL   MCH 29.3 26.0 - 34.0 pg   MCHC 32.5 30.0 - 36.0 g/dL   RDW 14.1 11.5 - 15.5 %   Platelets 199 150 - 400 K/uL  Urinalysis, Routine w reflex microscopic (not at [facility])  Result Value Ref Range   Color, Urine YELLOW YELLOW   APPearance CLEAR CLEAR   Specific Gravity, Urine 1.010 1.005 - 1.030   pH 7.0 5.0 - 8.0   Glucose, UA 100 (A) NEGATIVE mg/dL   Hgb urine dipstick NEGATIVE NEGATIVE   Bilirubin Urine NEGATIVE NEGATIVE   Ketones, ur NEGATIVE NEGATIVE mg/dL   Protein, ur NEGATIVE NEGATIVE mg/dL   Nitrite NEGATIVE NEGATIVE   Leukocytes, UA TRACE (A) NEGATIVE  Protime-INR  Result Value Ref Range   Prothrombin Time 12.4 11.6 - 15.2 seconds   INR 0.90 0.00 - 1.49  Troponin I  Result Value Ref Range   Troponin I <0.03 <0.031 ng/mL  Urine microscopic-add on  Result Value Ref Range   Squamous Epithelial / LPF 0-5 (A) NONE SEEN   WBC, UA 0-5 0 - 5 WBC/hpf   RBC / HPF 0-5 0 - 5 RBC/hpf   Bacteria, UA NONE SEEN NONE SEEN    They dec her  metoprolol to 12.5 mg bid Pulse still in 50s BP Readings from Last 3 Encounters:  09/21/15 140/84  09/18/15 167/80  08/26/15 199/106    States at home she has high bp -- 150s-160 /90s   States she is unsteady  Desires a small rolling walker with seat  She says she cannot walk "as good" - does not know why  Feet feel heavy  Not light headed    Her f/u cardiology appt 1/13   Patient Active Problem List   Diagnosis Date Noted  . Syncope 09/21/2015  . Bradycardia 09/21/2015  . Gait disorder 09/21/2015  . History of fall 08/24/2015  . Hearing loss 08/24/2015  . Chest wall contusion 08/24/2015  . Cough 02/26/2015  . Lightheaded 11/06/2014  . Leukocytosis 08/07/2014  . Acute on chronic diastolic heart failure ([place]) 08/07/2014  . Encounter for therapeutic drug monitoring 08/06/2014  . Chronic diastolic CHF (congestive heart failure) ([place])   . CAD (coronary artery disease)   . Type II diabetes mellitus ([place])   . Hyperlipidemia   . Acute on  chronic diastolic CHF (congestive heart failure), NYHA class 3 ([place]) 07/27/2014  . Atrial fibrillation ([place])   . Nausea without vomiting   . Transaminitis 07/20/2014  . Atrial flutter ([place]) 07/20/2014  . NSTEMI (non-ST elevated myocardial infarction) ([place]) 07/18/2014  . Dyslipidemia 07/18/2014  . Leg cramps 02/17/2014  . Encounter for Medicare annual wellness exam 08/19/2013  . Skin lesion 03/16/2013  . Other screening mammogram 05/17/2012  . Adult failure to thrive 05/17/2011  . Palpitations 02/16/2011  . DM2 (diabetes mellitus, type 2) ([place]) 02/14/2011  . ANEMIA, VITAMIN B12 DEFICIENCY 09/26/2010  . PEDAL EDEMA 03/16/2010  . [place] ALLERGY 09/06/2009  . Vitamin D deficiency 03/02/2009  . DIVERTICULITIS, COLON 11/12/2008  . COLONIC POLYPS, HX OF 09/02/2008  . HYPERCHOLESTEROLEMIA 12/10/2006  . Essential hypertension 12/10/2006  . Osteoporosis 03/18/2002   Past Medical History  Diagnosis Date  . Osteoporosis   . Hyperlipidemia   . Hypertension   . Hypercalcemia   . CAD (coronary artery disease)     a. 1989 s/p MI, TPA, PTCA;  b. 06/2014 NSTEMI/Cath: LM nl, LAD 30-40, D2 90, small, LCX 135m OM1 90, RCA Cs2+ 70-80p, 50-633m90d (? ruptured plaque), EF 55-60%-->Med Rx.  . Marland Kitchenoster     past  . Diverticulosis   . Hx of adenomatous colonic polyps 2010    last colonscopy 2010,Dr ElVira Agar no need for further  colonoscopy given age  . PAF/PAFlutter     a. 06/2014->amio and coumadin started.  . Elevated LFTs     a. 06/2014 Abd U/S: no GB pathology.  . Chronic diastolic CHF (congestive heart failure) ([place]    a. 07/2014 Echo: EF 60-65%, Gr 1 DD, mild to mod TR, PASP 3723m.  . Myocardial infarction ([place]  . Type II diabetes mellitus ([place] . GERD (gastroesophageal reflux disease)   . Arthritis     "hips" (08/07/2014)   Past Surgical History  Procedure Laterality Date  . Vaginal hysterectomy  1970's?    ovaries intact  . Shoulder open rotator cuff repair Right  1980's  . Cardiac catheterization  07/22/2014  . Coronary angioplasty  1989    "@ [facility]"  . Left heart catheterization with coronary angiogram N/A 07/22/2014    Procedure: LEFT HEART CATHETERIZATION WITH CORONARY ANGIOGRAM;  Surgeon: HenSinclair GroomsD;  [facility];  Service: Cardiovascular;  Laterality: N/A;   Social History  Substance Use Topics  . Smoking status: Former Smoker -- 0.25 packs/day for 30 years    Types: Cigarettes  . Smokeless tobacco: Never Used     Comment: "quit smoking in the 1980's"  . Alcohol Use: No   Family History  Problem Relation Age of Onset  . Heart disease Mother     CAD  . Heart disease Father     CAD  . Heart disease Brother     CAD   Allergies  Allergen Reactions  . Alendronate Sodium   . Doxycycline Other (See Comments)    Inc LFT  . Isosorbide Other (See Comments)  . Lidocaine Nausea Only    Given at Dentist office  . Metformin And Related     Decreases appetite resulting in weight loss  . Metronidazole     REACTION: sick/ GI  . Nifedipine Other (See Comments)   Current Outpatient Prescriptions on File Prior to Visit  Medication Sig Dispense Refill  . ACCU-CHEK FASTCLIX LANCETS MISC Use as instructed to test blood sugar once daily or as needed.  Diagnosis:  E11.9   Non insulin-dependent. 102 each 4  . amiodarone (PACERONE) 200 MG tablet Take 1 tablet (200 mg total) by mouth daily.    . Blood Glucose Monitoring Suppl (ACCU-CHEK AVIVA PLUS) W/DEVICE KIT Use as instructed to test blood sugar once daily or as needed.  Diagnosis:  E11.9  Non insulin-dependent. 1 kit 0  . cholecalciferol (VITAMIN D) 1000 UNITS tablet Take 2,000 Units by mouth daily at 12 noon.     . cloNIDine (CATAPRES) 0.1 MG tablet Take 1 tablet (0.1 mg total) by mouth 2 (two) times daily. 60 tablet 6  . Cyanocobalamin (VITAMIN B-12 PO) Take 1 tablet by mouth daily at 12 noon.     . docusate sodium 100 MG CAPS Take 100 mg by mouth 2 (two) times daily. 10 capsule  0  . esomeprazole (NEXIUM) 40 MG capsule Take 1 capsule (40 mg total) by mouth daily at 12 noon. (Patient taking differently: Take 40 mg by mouth at bedtime. ) 90 capsule 2  . furosemide (LASIX) 40 MG tablet Take 1 tablet (40 mg total) by mouth 2 (two) times daily. As directed by cardiologist (Patient taking differently: Take 40 mg by mouth daily. As directed by cardiologist, Pt may take additional doses as needed) 180 tablet 2  . glucose blood test strip Use as instructed to test blood sugar once daily or as needed.  Diagnosis:  E11.9   Non insulin-dependent.    Marland Kitchen glucose blood test strip Use as instructed to check blood sugar once daily or as needed.  Diagnosis:  E11.9   Non insulin-dependent. 100 each 4  . lisinopril (PRINIVIL,ZESTRIL) 20 MG tablet TAKE 1 TABLET EVERY DAY (Patient taking differently: TAKE 1 TABLET EVERY DAY AT NOON) 90 tablet 2  . metoprolol tartrate (LOPRESSOR) 25 MG tablet Take 0.5 tablets (12.5 mg total) by mouth 2 (two) times daily. 180 tablet 3  . nitroGLYCERIN (NITROSTAT) 0.4 MG SL tablet Place 1 tablet (0.4 mg total) under the tongue every 5 (five) minutes as needed. For chest pain and if not improved seek medical attention in ER. 15 tablet 0  . pravastatin (PRAVACHOL) 40 MG tablet Take 1 tablet (40 mg total) by mouth daily. 90 tablet 3  . sitaGLIPtin (JANUVIA) 100 MG tablet Take 1 tablet (100 mg total) by mouth daily. 90 tablet 2  . warfarin (COUMADIN) 3 MG tablet Take 1 tablet (3 mg total)  by mouth daily at 6 PM. Take as directed by the [facility] Clinic. (Patient taking differently: Take 1.5-3 mg by mouth daily at 6 PM. 1.5 mg Mon, Wed, and Fri. All other days 3 mg .Take as directed by the Coumadin Clinic.) 90 tablet 2   No current facility-administered medications on file prior to visit.     Review of Systems Review of Systems  Constitutional: Negative for fever, appetite change, and unexpected weight change.  Eyes: Negative for pain and visual disturbance.    Respiratory: Negative for cough and shortness of breath.   Cardiovascular: Negative for cp or palpitations    Gastrointestinal: Negative for nausea, diarrhea and constipation.  Genitourinary: Negative for urgency and frequency. neg for dysuria MSk pos for "problems walking"- not picking feet up well  Skin: Negative for pallor or rash   Neurological: Negative for weakness, light-headedness, numbness and headaches. pos for poor balance  Hematological: Negative for adenopathy. Does not bruise/bleed easily.  Psychiatric/Behavioral: Negative for dysphoric mood. The patient is not nervous/anxious.         Objective:   Physical Exam  Constitutional: She appears well-developed and well-nourished. No distress.  Well appearing   HENT:  Head: Normocephalic and atraumatic.  Mouth/Throat: Oropharynx is clear and moist.  No scalp hematoma or tenderness   Eyes: Conjunctivae and EOM are normal. Pupils are equal, round, and reactive to light.  Neck: Normal range of motion. Neck supple. No JVD present. Carotid bruit is not present. No thyromegaly present.  Cardiovascular: Normal heart sounds and intact distal pulses.  Exam reveals no gallop.   Rate in 50s No orthostatic changes   Pulmonary/Chest: Effort normal and breath sounds normal. No respiratory distress. She has no wheezes. She has no rales.  No crackles  Abdominal: Soft. Bowel sounds are normal. She exhibits no distension, no abdominal bruit and no mass. There is no tenderness.  Musculoskeletal: She exhibits no edema.  No LS tenderness  Lymphadenopathy:    She has no cervical adenopathy.  Neurological: She is alert. She has normal reflexes. She displays no atrophy and no tremor. No cranial nerve deficit. She exhibits normal muscle tone. Gait abnormal. Coordination normal.  Gait is wide based and shuffling  No cogwheel rigidity of limbs No bradykinesia noted     Skin: Skin is warm and dry. No rash noted.  Psychiatric: She has a normal  mood and affect.          Assessment & Plan:   Problem List Items Addressed This Visit      Cardiovascular and Mediastinum   Essential hypertension    Was newly started on clonidine by cardiology Beta blocker was cut in 1/2 by ED after syncopal episode BP: 140/84 mmHg  Per pt-higher at home Arranged cardiology f/u      Relevant Orders   Ambulatory referral to Cardiology   Syncope - Primary    Rev ED notes/labs/studies from 09/19/15 when she was seen after syncopal episode at home  Her Beta blocker was cut in 1/2 -and pulse is currently in the 50s Per pt bp is up at home (after recent start on clonidine)- but she feels ok overall  Today BP: 140/84 mmHg   Will arrange f/u with cardiology - unsure if syncope may be due to episodes of bradycardia       Relevant Orders   Ambulatory referral to Cardiology     Other   Bradycardia    ? If cause of syncope Ref to cardiology  Metoprolol  was cut in ED      Relevant Orders   Ambulatory referral to Cardiology   Gait disorder    Pt c/o vague problems walking- difficult for her to describe  Can fatigue easily after 5-10 minutes- req small wheeled walker with seat-px provided and eval done  She has slow shuffling gait that is wide based No s/s of parkinsons-will watch for bradykinesia Consider neruo eval if needed CT of head was reviewed

## 2015-09-23 ENCOUNTER — Ambulatory Visit (INDEPENDENT_AMBULATORY_CARE_PROVIDER_SITE_OTHER): Payer: Commercial Managed Care - HMO | Admitting: Cardiovascular Disease

## 2015-09-23 ENCOUNTER — Encounter: Payer: Self-pay | Admitting: Cardiovascular Disease

## 2015-09-23 VITALS — BP 195/104 | HR 67 | Ht 62.0 in | Wt 114.0 lb

## 2015-09-23 DIAGNOSIS — I10 Essential (primary) hypertension: Secondary | ICD-10-CM

## 2015-09-23 DIAGNOSIS — R55 Syncope and collapse: Secondary | ICD-10-CM | POA: Diagnosis not present

## 2015-09-23 DIAGNOSIS — I2583 Coronary atherosclerosis due to lipid rich plaque: Secondary | ICD-10-CM

## 2015-09-23 DIAGNOSIS — I5033 Acute on chronic diastolic (congestive) heart failure: Secondary | ICD-10-CM

## 2015-09-23 DIAGNOSIS — I4891 Unspecified atrial fibrillation: Secondary | ICD-10-CM

## 2015-09-23 DIAGNOSIS — I2510 Atherosclerotic heart disease of native coronary artery without angina pectoris: Secondary | ICD-10-CM | POA: Diagnosis not present

## 2015-09-23 DIAGNOSIS — E785 Hyperlipidemia, unspecified: Secondary | ICD-10-CM

## 2015-09-23 MED ORDER — CLONIDINE HCL 0.1 MG/24HR TD PTWK: 0.1000 mg | MEDICATED_PATCH | TRANSDERMAL | Status: DC

## 2015-09-23 NOTE — Assessment & Plan Note (Addendum)
Etiology of her syncope is unclear, concerning for orthostasis 30 day monitor could be ordered for any near syncope or syncope episodes Medication changes for blood pressure as below

## 2015-09-23 NOTE — Assessment & Plan Note (Signed)
Blood pressure is very labile. She prefers to retry her clonidine patch. We will restart this at 0.1 mg patch weekly Suggested she only use half dose clonidine if her blood pressure stays elevated for long periods of time Suggested she stay well-hydrated Continue on her lisinopril at midday

## 2015-09-23 NOTE — Assessment & Plan Note (Signed)
Recommended she continue pravastatin for now

## 2015-09-23 NOTE — Assessment & Plan Note (Signed)
She appears relatively euvolemic on today's visit No changes to her medications 

## 2015-09-23 NOTE — Assessment & Plan Note (Signed)
She denies any symptoms concerning for arrhythmia though certainly of concern given recent syncope We will have a low threshold for ordering 30 day monitor

## 2015-09-23 NOTE — Progress Notes (Signed)
Patient ID: Bianca Mueller, female    DOB: 1926/03/09, 82 y.o.   MRN: [id number]  HPI Comments: 82 year old female with history of non-ST elevation MI complicated by paroxysmal atrial fibrillation and flutter who presents for follow-up of her coronary artery disease and atrial flutter. Prior non-ST elevation MI October 2015 On her last clinic visit blood pressure is very high, we recommended she start clonidine 0.1 mg twice a day ( she was previously on a clonidine patch)  In follow-up today, blood pressure is labile, sometimes runs low in the late morning 440 systolic at the lowest, on average 140s, 150 In the morning prior to her pill will run 160 to 170 In the afternoon she will typically have 102 systolic Before bed she will run high 140, all way up to 170  She reports having syncopal episode last weekend, happened  Midmorning, she found herself on the ground, lasted for several minutes She had the back of her head, went to the emergency room, had a CT scan Denies feeling lightheadedness prior to the event Otherwise feels fine She is interested in retrying the clonidine patch  Blood pressure on today's visit is markedly elevated, 190 with standing Family that presents with her today reports it is typically elevated in Dr. offices  EKG on today's visit shows normal sinus rhythm with rate 68 bpm,Nonspecific ST abnormality anterolateral leads, inferior leads, prolonged QTc  Past medical history Recent fall as she was getting out of her car in the garage Feels her feet slipped, did not have good balance, went down and hurt her ribs Denies any loss of consciousness, near syncope or dizziness  no further falls since that time Reports that her balance is okay, does not need a walker  hemoglobin A1c 7.4, total cholesterol 150  In the hospital she had atrial fibrillation/flutter with RVR, started on diltiazem and amiodarone  cardiac catheterization showing severe diagonal, circumflex  OM1, and RCA disease, medical management recommended Started on Coumadin and Plavix  follow-up in clinic she was still in atrial flutter with rate 86 bpm, normal sinus rhythm on her last clinic visit  Previous 2-D echocardiogram showed normal LV function.   Allergies  Allergen Reactions  . Alendronate Sodium   . Doxycycline Other (See Comments)    Inc LFT  . Isosorbide Other (See Comments)  . Lidocaine Nausea Only    Given at Dentist office  . Metformin And Related     Decreases appetite resulting in weight loss  . Metronidazole     REACTION: sick/ GI  . Nifedipine Other (See Comments)    Outpatient Encounter Prescriptions as of 09/23/2015  Medication Sig  . ACCU-CHEK FASTCLIX LANCETS MISC Use as instructed to test blood sugar once daily or as needed.  Diagnosis:  E11.9   Non insulin-dependent.  Marland Kitchen amiodarone (PACERONE) 200 MG tablet Take 1 tablet (200 mg total) by mouth daily.  . Blood Glucose Monitoring Suppl (ACCU-CHEK AVIVA PLUS) W/DEVICE KIT Use as instructed to test blood sugar once daily or as needed.  Diagnosis:  E11.9  Non insulin-dependent.  . cholecalciferol (VITAMIN D) 1000 UNITS tablet Take 2,000 Units by mouth daily at 12 noon.   . cloNIDine (CATAPRES) 0.1 MG tablet Take 1 tablet (0.1 mg total) by mouth 2 (two) times daily.  . Cyanocobalamin (VITAMIN B-12 PO) Take 1 tablet by mouth daily at 12 noon.   . docusate sodium 100 MG CAPS Take 100 mg by mouth 2 (two) times daily.  Marland Kitchen esomeprazole ([place])  40 MG capsule Take 1 capsule (40 mg total) by mouth daily at 12 noon. (Patient taking differently: Take 40 mg by mouth at bedtime. )  . furosemide (LASIX) 40 MG tablet Take 1 tablet (40 mg total) by mouth 2 (two) times daily. As directed by cardiologist (Patient taking differently: Take 40 mg by mouth daily. As directed by cardiologist, Pt may take additional doses as needed)  . glucose blood test strip Use as instructed to test blood sugar once daily or as needed.  Diagnosis:   E11.9   Non insulin-dependent.  Marland Kitchen glucose blood test strip Use as instructed to check blood sugar once daily or as needed.  Diagnosis:  E11.9   Non insulin-dependent.  Marland Kitchen lisinopril (PRINIVIL,ZESTRIL) 20 MG tablet TAKE 1 TABLET EVERY DAY (Patient taking differently: TAKE 1 TABLET EVERY DAY AT NOON)  . metoprolol tartrate (LOPRESSOR) 25 MG tablet Take 0.5 tablets (12.5 mg total) by mouth 2 (two) times daily.  . nitroGLYCERIN (NITROSTAT) 0.4 MG SL tablet Place 1 tablet (0.4 mg total) under the tongue every 5 (five) minutes as needed. For chest pain and if not improved seek medical attention in ER.  . pravastatin (PRAVACHOL) 40 MG tablet Take 1 tablet (40 mg total) by mouth daily.  . sitaGLIPtin (JANUVIA) 100 MG tablet Take 1 tablet (100 mg total) by mouth daily.  Marland Kitchen warfarin (COUMADIN) 3 MG tablet Take 1 tablet (3 mg total) by mouth daily at 6 PM. Take as directed by the [facility]mad[facility] Clinic. (Patient taking differently: Take 1.5-3 mg by mouth daily at 6 PM. 1.5 mg Mon, Wed, and Fri. All other days 3 mg .Take as directed by the Coumadin Clinic.)  . cloNIDine (CATAPRES - DOSED IN MG/24 HR) 0.1 mg/24hr patch Place 1 patch (0.1 mg total) onto the skin once a week.   No facility-administered encounter medications on file as of 09/23/2015.    Past Medical History  Diagnosis Date  . Osteoporosis   . Hyperlipidemia   . Hypertension   . Hypercalcemia   . CAD (coronary artery disease)     a. 1989 s/p MI, TPA, PTCA;  b. 06/2014 NSTEMI/Cath: LM nl, LAD 30-40, D2 90, small, LCX 119m OM1 90, RCA Cs2+ 70-80p, 50-684m90d (? ruptured plaque), EF 55-60%-->Med Rx.  . Marland Kitchenoster     past  . Diverticulosis   . Hx of adenomatous colonic polyps 2010    last colonscopy 2010,Dr ElVira Agar no need for further  colonoscopy given age  . PAF/PAFlutter     a. 06/2014->amio and coumadin started.  . Elevated LFTs     a. 06/2014 Abd U/S: no GB pathology.  . Chronic diastolic CHF (congestive heart failure) ([place]    a. 07/2014  Echo: EF 60-65%, Gr 1 DD, mild to mod TR, PASP 3723m.  . Myocardial infarction ([place]  . Type II diabetes mellitus ([place] . GERD (gastroesophageal reflux disease)   . Arthritis     "hips" (08/07/2014)    Past Surgical History  Procedure Laterality Date  . Vaginal hysterectomy  1970's?    ovaries intact  . Shoulder open rotator cuff repair Right 1980's  . Left heart catheterization with coronary angiogram N/A 07/22/2014    Procedure: LEFT HEART CATHETERIZATION WITH CORONARY ANGIOGRAM;  Surgeon: HenSinclair GroomsD;  [facility];  Service: Cardiovascular;  Laterality: N/A;  . Cardiac catheterization  07/22/2014  . Coronary angioplasty  1989    "@ [facility]"    Social History  reports that she has quit smoking. Her smoking use included Cigarettes. She has a 7.5 pack-year smoking history. She has never used smokeless tobacco. She reports that she does not drink alcohol or use illicit drugs.  Family History family history includes Heart disease in her brother, father, and mother.   Review of Systems  Constitutional: Negative.   Respiratory: Negative.   Cardiovascular: Negative.   Gastrointestinal: Negative.   Musculoskeletal: Negative.   Neurological: Negative.   Hematological: Negative.   Psychiatric/Behavioral: Negative.   All other systems reviewed and are negative.  BP 195/104 mmHg  Pulse 67  Ht 5' 2" (1.575 m)  Wt 114 lb (51.71 kg)  BMI 20.85 kg/m2   Physical Exam  Constitutional: She is oriented to person, place, and time.  Thin appearing  HENT:  Head: Normocephalic.  Nose: Nose normal.  Mouth/Throat: Oropharynx is clear and moist.  Eyes: Conjunctivae are normal. Pupils are equal, round, and reactive to light.  Neck: Normal range of motion. Neck supple. No JVD present.  Cardiovascular: Normal rate, regular rhythm, S1 normal, S2 normal, normal heart sounds and intact distal pulses.  Exam reveals no gallop and no friction rub.   No murmur  heard. Pulmonary/Chest: Effort normal and breath sounds normal. No respiratory distress. She has no wheezes. She has no rales. She exhibits no tenderness.  Abdominal: Soft. Bowel sounds are normal. She exhibits no distension. There is no tenderness.  Musculoskeletal: Normal range of motion. She exhibits no edema or tenderness.  Lymphadenopathy:    She has no cervical adenopathy.  Neurological: She is alert and oriented to person, place, and time. Coordination normal.  Skin: Skin is warm and dry. No rash noted. No erythema.  Psychiatric: She has a normal mood and affect. Her behavior is normal. Judgment and thought content normal.    Assessment and Plan  Nursing note and vitals reviewed.

## 2015-09-23 NOTE — Patient Instructions (Signed)
You are doing well. Please hold the clonidine pill and start the clonidine patch  If your blood pressure runs high, Ok to take a 1/2 pill of clonidine up to twice a day  Please call us if you have new issues that need to be addressed before your next appt.  Your physician wants you to follow-up in: 3 months.  You will receive a reminder letter in the mail two months in advance. If you don't receive a letter, please call our office to schedule the follow-up appointment.

## 2015-09-23 NOTE — Assessment & Plan Note (Signed)
Currently with no symptoms of angina. No further workup at this time. Continue current medication regimen. 

## 2015-09-29 ENCOUNTER — Other Ambulatory Visit: Payer: Self-pay | Admitting: Family Medicine

## 2015-09-29 DIAGNOSIS — R269 Unspecified abnormalities of gait and mobility: Secondary | ICD-10-CM | POA: Diagnosis not present

## 2015-09-30 NOTE — Telephone Encounter (Signed)
Rout to [facility] for coumadin refill request

## 2015-10-01 ENCOUNTER — Ambulatory Visit: Payer: Commercial Managed Care - HMO | Admitting: Nurse Practitioner

## 2015-10-05 ENCOUNTER — Other Ambulatory Visit: Payer: Self-pay | Admitting: Cardiovascular Disease

## 2015-10-05 ENCOUNTER — Other Ambulatory Visit: Payer: Self-pay

## 2015-10-05 MED ORDER — CLONIDINE HCL 0.2 MG/24HR TD PTWK: 0.2000 mg | MEDICATED_PATCH | TRANSDERMAL | Status: DC

## 2015-10-05 NOTE — Telephone Encounter (Signed)
Pt c/o BP issue: STAT if pt c/o blurred vision, one-sided weakness or slurred speech  1. What are your last 5 BP readings?  188/99 173/91 158/94 193/104 All these are within the last week. Pt is not sure of the day of each  2. Are you having any other symptoms (ex. Dizziness, headache, blurred vision, passed out)? No   3. What is your BP issue? elevated

## 2015-10-05 NOTE — Telephone Encounter (Signed)
Would place 2 patches at one time Next Prescription will be for higher dose patch at which time she would use one patch at a time Would continue to use clonidine one half pill twice a day as needed

## 2015-10-05 NOTE — Telephone Encounter (Signed)
Spoke w/ pt.  Pt was advised on 09/22/14: "Please hold the clonidine pill and start the clonidine patch If your blood pressure runs high, Ok to take a 1/2 pill of clonidine up to twice a day" She reports that she has been takingn the extra clonidine as directed, but her BP is still running high.  She would like to know if she needs a stronger patch or if her pills need to be adjusted. Advised her that I will make Dr. Rockey Situ aware and call her back w/ his recommendation.

## 2015-10-06 MED ORDER — CLONIDINE HCL 0.1 MG PO TABS: 0.1000 mg | ORAL_TABLET | Freq: Two times a day (BID) | ORAL | Status: DC

## 2015-10-06 MED ORDER — CLONIDINE HCL 0.2 MG PO TABS: 0.2000 mg | ORAL_TABLET | Freq: Two times a day (BID) | ORAL | Status: DC

## 2015-10-06 MED ORDER — CLONIDINE HCL 0.2 MG/24HR TD PTWK: 0.2000 mg | MEDICATED_PATCH | TRANSDERMAL | Status: DC

## 2015-10-06 NOTE — Telephone Encounter (Signed)
Spoke w/ Bianca Mueller.  Advised her of Dr. Donivan Scull recommendation. She verbalizes understanding and will call back if BP readings do not improve.

## 2015-10-08 ENCOUNTER — Other Ambulatory Visit: Payer: Self-pay

## 2015-10-08 MED ORDER — CLONIDINE HCL 0.2 MG/24HR TD PTWK: 0.2000 mg | MEDICATED_PATCH | TRANSDERMAL | Status: DC

## 2015-10-20 ENCOUNTER — Other Ambulatory Visit: Payer: Self-pay | Admitting: Cardiovascular Disease

## 2015-10-20 ENCOUNTER — Encounter: Payer: Self-pay | Admitting: Cardiovascular Disease

## 2015-10-20 MED ORDER — CLONIDINE HCL 0.3 MG/24HR TD PTWK: 0.3000 mg | MEDICATED_PATCH | TRANSDERMAL | Status: DC

## 2015-10-22 ENCOUNTER — Other Ambulatory Visit: Payer: Self-pay

## 2015-10-26 ENCOUNTER — Other Ambulatory Visit: Payer: Self-pay

## 2015-10-26 ENCOUNTER — Encounter: Payer: Self-pay | Admitting: Cardiovascular Disease

## 2015-10-27 ENCOUNTER — Encounter: Payer: Self-pay | Admitting: Primary Care

## 2015-10-27 ENCOUNTER — Ambulatory Visit (INDEPENDENT_AMBULATORY_CARE_PROVIDER_SITE_OTHER)
Admission: RE | Admit: 2015-10-27 | Discharge: 2015-10-27 | Disposition: A | Discharge status: Home / Self Care | Payer: Commercial Managed Care - HMO | Source: Ambulatory Visit | Attending: Primary Care | Admitting: Primary Care

## 2015-10-27 ENCOUNTER — Ambulatory Visit (INDEPENDENT_AMBULATORY_CARE_PROVIDER_SITE_OTHER): Payer: Commercial Managed Care - HMO | Admitting: *Deleted

## 2015-10-27 ENCOUNTER — Ambulatory Visit (INDEPENDENT_AMBULATORY_CARE_PROVIDER_SITE_OTHER): Payer: Commercial Managed Care - HMO | Admitting: Primary Care

## 2015-10-27 VITALS — BP 142/68 | HR 63 | Temp 98.2°F | Ht 61.0 in | Wt 114.0 lb

## 2015-10-27 DIAGNOSIS — R05 Cough: Secondary | ICD-10-CM | POA: Diagnosis not present

## 2015-10-27 DIAGNOSIS — I4892 Unspecified atrial flutter: Secondary | ICD-10-CM | POA: Diagnosis not present

## 2015-10-27 DIAGNOSIS — Z5181 Encounter for therapeutic drug level monitoring: Secondary | ICD-10-CM | POA: Diagnosis not present

## 2015-10-27 DIAGNOSIS — I4891 Unspecified atrial fibrillation: Secondary | ICD-10-CM | POA: Diagnosis not present

## 2015-10-27 DIAGNOSIS — R059 Cough, unspecified: Secondary | ICD-10-CM

## 2015-10-27 DIAGNOSIS — R531 Weakness: Secondary | ICD-10-CM

## 2015-10-27 LAB — BASIC METABOLIC PANEL
BUN: 18 mg/dL (ref 6–23)
CALCIUM: 9.3 mg/dL (ref 8.4–10.5)
CO2: 27 mEq/L (ref 19–32)
CREATININE: 0.92 mg/dL (ref 0.40–1.20)
Chloride: 97 mEq/L (ref 96–112)
GFR: 61.34 mL/min (ref >60.00)
GLUCOSE: 236 mg/dL — AB (ref 70–99)
POTASSIUM: 3.6 meq/L (ref 3.5–5.1)
Sodium: 135 mEq/L (ref 135–145)

## 2015-10-27 LAB — CBC WITH DIFFERENTIAL/PLATELET
Basophils Absolute: 0 10*3/uL (ref 0.0–0.1)
Basophils Relative: 0.3 % (ref 0.0–3.0)
EOS PCT: 0.2 % (ref 0.0–5.0)
Eosinophils Absolute: 0 10*3/uL (ref 0.0–0.7)
HCT: 44.4 % (ref 36.0–46.0)
Hemoglobin: 14.5 g/dL (ref 12.0–15.0)
LYMPHS ABS: 0.6 10*3/uL — AB (ref 0.7–4.0)
Lymphocytes Relative: 8.4 % — ABNORMAL LOW (ref 12.0–46.0)
MCHC: 32.6 g/dL (ref 30.0–36.0)
MCV: 89.1 fl (ref 78.0–100.0)
MONO ABS: 0.7 10*3/uL (ref 0.1–1.0)
Monocytes Relative: 9.9 % (ref 3.0–12.0)
NEUTROS PCT: 81.2 % — AB (ref 43.0–77.0)
Neutro Abs: 5.5 10*3/uL (ref 1.4–7.7)
Platelets: 186 10*3/uL (ref 150.0–400.0)
RBC: 4.98 Mil/uL (ref 3.87–5.11)
RDW: 15.3 % (ref 11.5–15.5)
WBC: 6.8 10*3/uL (ref 4.0–10.5)

## 2015-10-27 LAB — GLUCOSE, POCT (MANUAL RESULT ENTRY): POC GLUCOSE: 234 mg/dL — AB (ref 70–99)

## 2015-10-27 LAB — POCT INR: INR: 1.8

## 2015-10-27 NOTE — Patient Instructions (Signed)
Complete lab work prior to leaving today. I will notify you of your results once received.   Complete xray(s) prior to leaving today. I will notify you of your results once received.  Ensure that you are drinking enough water daily and slowly increase your consumption of food.  I will be in touch with you later today.  It was a pleasure meeting you!

## 2015-10-27 NOTE — Progress Notes (Signed)
INR is subtherapeutic today.  Patient denies any missed doses or medication changes.  Will boost today then continue with the same dose.  Will recheck in 4 weeks and make additional changes at that time if needed.

## 2015-10-27 NOTE — Progress Notes (Signed)
Pre visit review using our clinic review tool, if applicable. No additional management support is needed unless otherwise documented below in the visit note. 

## 2015-10-27 NOTE — Progress Notes (Signed)
Subjective:    Patient ID: Bianca Mueller, female    DOB: 09/12/1926, 82 y.o.   MRN: [id number]  HPI  Bianca Mueller is an 82 year old female with an extensive medical history including CHF, Afib, Syncope, Diabetes who presents today with a chief complaint of dizziness and fatigue. She's felt dizzy and weak for the past several weeks and over the past 24-48 hours she's felt an increase in fatigue. She also reports nausea. Denies cough, fevers, nasal congestion.   This morning she felt okay, she ate breakfast this morning and was able to dress herself without difficulty. Prior to leaving her her appointment this morning she felt dizziness and fell. She hit her face on the chair.  Denies LOC, headaches, dizziness at this time, chest pain, shortness of breath. Her blood sugar this morning was 80 before breakfast. Her appetite has been decreased over the past several days and she's had little water intake. She's been coughing for a total of 1 week which has not improved. She has a history of COPD and prior tobacco abuse.  She's not taken anything OTC for her cough. She is managed on coumadin and is due for recheck today.  Review of Systems  Constitutional: Positive for fatigue. Negative for fever and chills.  HENT: Negative for rhinorrhea.   Respiratory: Negative for cough and shortness of breath.   Cardiovascular: Negative for chest pain.  Gastrointestinal: Negative for nausea and abdominal pain.  Neurological: Positive for dizziness and weakness.       Past Medical History  Diagnosis Date  . Osteoporosis   . Hyperlipidemia   . Hypertension   . Hypercalcemia   . CAD (coronary artery disease)     a. 1989 s/p MI, TPA, PTCA;  b. 06/2014 NSTEMI/Cath: LM nl, LAD 30-40, D2 90, small, LCX 147m OM1 90, RCA Cs2+ 70-80p, 50-649m90d (? ruptured plaque), EF 55-60%-->Med Rx.  . Marland Kitchenoster     past  . Diverticulosis   . Hx of adenomatous colonic polyps 2010    last colonscopy 2010,Dr ElVira Agar no need  for further  colonoscopy given age  . PAF/PAFlutter     a. 06/2014->amio and coumadin started.  . Elevated LFTs     a. 06/2014 Abd U/S: no GB pathology.  . Chronic diastolic CH[place] ([place]ive heart failure[place]    a. 07/2014 Echo: EF 60-65%, Gr 1 DD, mild to mod TR, PASP 3751m.  . Myocardial infarction ([place]  . Type II diabetes mellitus ([facility] . GERD (gastroesophageal reflux disease)   . Arthritis     "hips" (08/07/2014)    Social History   Social History  . Marital Status: Married    Spouse Name: N/A  . Number of Children: N/A  . Years of Education: N/A   Occupational History  . Not on file.   Social History Main Topics  . Smoking status: Former Smoker -- 0.25 packs/day for 30 years    Types: Cigarettes  . Smokeless tobacco: Never Used     Comment: "quit smoking in the 1980's"  . Alcohol Use: No  . Drug Use: No  . Sexual Activity: No   Other Topics Concern  . Not on file   Social History Narrative    Past Surgical History  Procedure Laterality Date  . Vaginal hysterectomy  1970's?    ovaries intact  . Shoulder open rotator cuff repair Right 1980's  . Left heart catheterization with coronary angiogram N/A 07/22/2014  Procedure: LEFT HEART CATHETERIZATION WITH CORONARY ANGIOGRAM;  Surgeon: Sinclair Grooms, MD;  Location: [facility] CATH LAB;  Service: Cardiovascular;  Laterality: N/A;  . Cardiac catheterization  07/22/2014  . Coronary angioplasty  1989    "@ [facility]"    Family History  Problem Relation Age of Onset  . Heart disease Mother     CAD  . Heart disease Father     CAD  . Heart disease Brother     CAD    Allergies  Allergen Reactions  . Alendronate Sodium   . Doxycycline Other (See Comments)    Inc LFT  . Isosorbide Other (See Comments)  . Lidocaine Nausea Only    Given at Dentist office  . Metformin And Related     Decreases appetite resulting in weight loss  . Metronidazole     REACTION: sick/ GI  . Nifedipine Other (See Comments)     Current Outpatient Prescriptions on File Prior to Visit  Medication Sig Dispense Refill  . ACCU-CHEK FASTCLIX LANCETS MISC Use as instructed to test blood sugar once daily or as needed.  Diagnosis:  E11.9   Non insulin-dependent. 102 each 4  . amiodarone (PACERONE) 200 MG tablet TAKE 1 TABLET TWICE DAILY 180 tablet 3  . Blood Glucose Monitoring Suppl (ACCU-CHEK AVIVA PLUS) W/DEVICE KIT Use as instructed to test blood sugar once daily or as needed.  Diagnosis:  E11.9  Non insulin-dependent. 1 kit 0  . cholecalciferol (VITAMIN D) 1000 UNITS tablet Take 2,000 Units by mouth daily at 12 noon.     . cloNIDine (CATAPRES) 0.1 MG tablet Take 1 tablet (0.1 mg total) by mouth 2 (two) times daily. 180 tablet 3  . Cyanocobalamin (VITAMIN B-12 PO) Take 1 tablet by mouth daily at 12 noon.     . docusate sodium 100 MG CAPS Take 100 mg by mouth 2 (two) times daily. 10 capsule 0  . esomeprazole (NEXIUM) 40 MG capsule Take 1 capsule (40 mg total) by mouth daily at 12 noon. (Patient taking differently: Take 40 mg by mouth at bedtime. ) 90 capsule 2  . furosemide (LASIX) 40 MG tablet Take 1 tablet (40 mg total) by mouth 2 (two) times daily. As directed by cardiologist (Patient taking differently: Take 40 mg by mouth daily. As directed by cardiologist, Pt may take additional doses as needed) 180 tablet 2  . glucose blood test strip Use as instructed to test blood sugar once daily or as needed.  Diagnosis:  E11.9   Non insulin-dependent.    Marland Kitchen glucose blood test strip Use as instructed to check blood sugar once daily or as needed.  Diagnosis:  E11.9   Non insulin-dependent. 100 each 4  . lisinopril (PRINIVIL,ZESTRIL) 20 MG tablet TAKE 1 TABLET EVERY DAY (Patient taking differently: TAKE 1 TABLET EVERY DAY AT NOON) 90 tablet 2  . metoprolol tartrate (LOPRESSOR) 25 MG tablet Take 0.5 tablets (12.5 mg total) by mouth 2 (two) times daily. 180 tablet 3  . nitroGLYCERIN (NITROSTAT) 0.4 MG SL tablet Place 1 tablet (0.4 mg  total) under the tongue every 5 (five) minutes as needed. For chest pain and if not improved seek medical attention in ER. 15 tablet 0  . pravastatin (PRAVACHOL) 40 MG tablet Take 1 tablet (40 mg total) by mouth daily. 90 tablet 3  . sitaGLIPtin (JANUVIA) 100 MG tablet Take 1 tablet (100 mg total) by mouth daily. 90 tablet 2  . warfarin (COUMADIN) 3 MG tablet Take as directed by  anti-coagulation clinic 70 tablet 0   No current facility-administered medications on file prior to visit.    BP 142/68 mmHg  Pulse 63  Temp(Src) 98.2 F (36.8 C) (Oral)  Ht _0  (1.549 m)  Wt 114 lb (51.71 kg)  BMI 21.55 kg/m2  SpO2 93%    Objective:   Physical Exam  Constitutional: She is oriented to person, place, and time.  HENT:  Right Ear: Tympanic membrane and ear canal normal.  Left Ear: Tympanic membrane and ear canal normal.  Nose: Right sinus exhibits no maxillary sinus tenderness and no frontal sinus tenderness. Left sinus exhibits no maxillary sinus tenderness and no frontal sinus tenderness.  Mouth/Throat: Oropharynx is clear and moist.  Eyes: Conjunctivae and EOM are normal. Pupils are equal, round, and reactive to light.  Neck: Neck supple.  Cardiovascular: Normal rate and regular rhythm.   Pulmonary/Chest: Effort normal. She has no wheezes.  Crackles noted to bilateral bases with a decrease in sounds to right side.  Lymphadenopathy:    She has no cervical adenopathy.  Neurological: She is alert and oriented to person, place, and time. She has normal reflexes.  Skin: Skin is warm and dry.  Mild bruising and abrasion above left eye and to left cheek.          Assessment & Plan:  Dizziness and Fatigue:  Endorses not feeling well since hospital discharge months ago. Symptoms worse over the past several weeks also with decrease in appetite and consumption of water.  CBG in office of 234.  Chest xray obtained due to crackles and cough: Negative for pneumonia. CMP and CBC  pending. Suspect weakness and fall related to decreased oral intake and declining of overall health. Discussed home safety with walker, rise slowly from sitting positions. Discussed to call me immediatly with headaches, increased bruising, further falls, blood sugars drop. She and her husband verbalized understanding.

## 2015-10-28 ENCOUNTER — Ambulatory Visit: Payer: Commercial Managed Care - HMO

## 2015-10-29 ENCOUNTER — Telehealth: Payer: Self-pay | Admitting: *Deleted

## 2015-10-29 MED ORDER — CLONIDINE HCL 0.1 MG PO TABS: 0.1000 mg | ORAL_TABLET | Freq: Two times a day (BID) | ORAL | Status: DC

## 2015-10-29 NOTE — Telephone Encounter (Signed)
Refill sent for clonidine 0.1 mg twice a day prn for increased BP. The patient is actually taking both the patch and the pill.

## 2015-10-29 NOTE — Telephone Encounter (Signed)
Pt granddaughter calling stating we sent refill to mail order when it needs to go to pharmacy listed below.  *STAT* If patient is at the pharmacy, call can be transferred to refill team.   1. Which medications need to be refilled? (please list name of each medication and dose if known) CloNIDine  2. Which pharmacy/location (including street and city if local pharmacy) is medication to be sent to? [name]   3. Do they need a 30 day or 90 day supply? [place]

## 2015-10-30 ENCOUNTER — Observation Stay (HOSPITAL_COMMUNITY)
Admission: EM | Admit: 2015-10-30 | Discharge: 2015-11-02 | Disposition: A | Discharge status: Home Health | Payer: Commercial Managed Care - HMO | Attending: Internal Medicine | Admitting: Internal Medicine

## 2015-10-30 ENCOUNTER — Encounter (HOSPITAL_COMMUNITY): Payer: Self-pay | Admitting: Emergency Medicine

## 2015-10-30 DIAGNOSIS — J189 Pneumonia, unspecified organism: Secondary | ICD-10-CM | POA: Diagnosis not present

## 2015-10-30 DIAGNOSIS — Z87891 Personal history of nicotine dependence: Secondary | ICD-10-CM | POA: Insufficient documentation

## 2015-10-30 DIAGNOSIS — Z90710 Acquired absence of both cervix and uterus: Secondary | ICD-10-CM | POA: Diagnosis not present

## 2015-10-30 DIAGNOSIS — I5032 Chronic diastolic (congestive) heart failure: Secondary | ICD-10-CM | POA: Diagnosis not present

## 2015-10-30 DIAGNOSIS — Z79899 Other long term (current) drug therapy: Secondary | ICD-10-CM | POA: Insufficient documentation

## 2015-10-30 DIAGNOSIS — W19XXXA Unspecified fall, initial encounter: Secondary | ICD-10-CM | POA: Diagnosis not present

## 2015-10-30 DIAGNOSIS — E119 Type 2 diabetes mellitus without complications: Secondary | ICD-10-CM

## 2015-10-30 DIAGNOSIS — I4891 Unspecified atrial fibrillation: Secondary | ICD-10-CM | POA: Diagnosis present

## 2015-10-30 DIAGNOSIS — E876 Hypokalemia: Secondary | ICD-10-CM | POA: Insufficient documentation

## 2015-10-30 DIAGNOSIS — T45515A Adverse effect of anticoagulants, initial encounter: Secondary | ICD-10-CM | POA: Diagnosis not present

## 2015-10-30 DIAGNOSIS — I480 Paroxysmal atrial fibrillation: Secondary | ICD-10-CM | POA: Insufficient documentation

## 2015-10-30 DIAGNOSIS — Z7901 Long term (current) use of anticoagulants: Secondary | ICD-10-CM | POA: Insufficient documentation

## 2015-10-30 DIAGNOSIS — I252 Old myocardial infarction: Secondary | ICD-10-CM | POA: Diagnosis not present

## 2015-10-30 DIAGNOSIS — M810 Age-related osteoporosis without current pathological fracture: Secondary | ICD-10-CM | POA: Insufficient documentation

## 2015-10-30 DIAGNOSIS — S0993XA Unspecified injury of face, initial encounter: Secondary | ICD-10-CM | POA: Diagnosis not present

## 2015-10-30 DIAGNOSIS — S0083XA Contusion of other part of head, initial encounter: Secondary | ICD-10-CM | POA: Diagnosis not present

## 2015-10-30 DIAGNOSIS — Z9181 History of falling: Secondary | ICD-10-CM

## 2015-10-30 DIAGNOSIS — S0990XA Unspecified injury of head, initial encounter: Secondary | ICD-10-CM | POA: Diagnosis not present

## 2015-10-30 DIAGNOSIS — N390 Urinary tract infection, site not specified: Secondary | ICD-10-CM

## 2015-10-30 DIAGNOSIS — I2510 Atherosclerotic heart disease of native coronary artery without angina pectoris: Secondary | ICD-10-CM | POA: Diagnosis present

## 2015-10-30 DIAGNOSIS — K219 Gastro-esophageal reflux disease without esophagitis: Secondary | ICD-10-CM | POA: Diagnosis not present

## 2015-10-30 DIAGNOSIS — I110 Hypertensive heart disease with heart failure: Secondary | ICD-10-CM | POA: Diagnosis not present

## 2015-10-30 DIAGNOSIS — R05 Cough: Secondary | ICD-10-CM | POA: Diagnosis not present

## 2015-10-30 DIAGNOSIS — R531 Weakness: Principal | ICD-10-CM

## 2015-10-30 DIAGNOSIS — E785 Hyperlipidemia, unspecified: Secondary | ICD-10-CM | POA: Diagnosis not present

## 2015-10-30 DIAGNOSIS — R791 Abnormal coagulation profile: Secondary | ICD-10-CM | POA: Diagnosis not present

## 2015-10-30 DIAGNOSIS — I10 Essential (primary) hypertension: Secondary | ICD-10-CM | POA: Diagnosis present

## 2015-10-30 NOTE — ED Notes (Signed)
EMS states pt called out tonight for nausea, vomiting, and dizziness  Pt states she has had a fever  Pt states she has had a cough for one week  Upon EMS arrival pt was hypertensive at 200/90  Last B/P when the got here was 166/82  IV was started and pt was given Zofran 4mg  IV prior to arrival  EKG was done and it was unremarkable a rate of 74 NSR

## 2015-10-30 NOTE — ED Notes (Signed)
Bed: [id number] Expected date: 10/30/15 Expected time: 10:56 PM Means of arrival: Ambulance Comments: 82 yo F  Nausea, vomiting

## 2015-10-31 ENCOUNTER — Emergency Department (HOSPITAL_COMMUNITY): Payer: Commercial Managed Care - HMO

## 2015-10-31 DIAGNOSIS — I2510 Atherosclerotic heart disease of native coronary artery without angina pectoris: Secondary | ICD-10-CM

## 2015-10-31 DIAGNOSIS — R531 Weakness: Secondary | ICD-10-CM | POA: Diagnosis not present

## 2015-10-31 DIAGNOSIS — R791 Abnormal coagulation profile: Secondary | ICD-10-CM | POA: Diagnosis not present

## 2015-10-31 DIAGNOSIS — N390 Urinary tract infection, site not specified: Secondary | ICD-10-CM | POA: Diagnosis not present

## 2015-10-31 DIAGNOSIS — R05 Cough: Secondary | ICD-10-CM | POA: Diagnosis not present

## 2015-10-31 DIAGNOSIS — Z9181 History of falling: Secondary | ICD-10-CM

## 2015-10-31 DIAGNOSIS — I481 Persistent atrial fibrillation: Secondary | ICD-10-CM | POA: Diagnosis not present

## 2015-10-31 DIAGNOSIS — T45515A Adverse effect of anticoagulants, initial encounter: Secondary | ICD-10-CM | POA: Diagnosis not present

## 2015-10-31 DIAGNOSIS — S0993XA Unspecified injury of face, initial encounter: Secondary | ICD-10-CM | POA: Diagnosis not present

## 2015-10-31 DIAGNOSIS — T148XXA Other injury of unspecified body region, initial encounter: Secondary | ICD-10-CM | POA: Insufficient documentation

## 2015-10-31 DIAGNOSIS — S0990XA Unspecified injury of head, initial encounter: Secondary | ICD-10-CM | POA: Diagnosis not present

## 2015-10-31 DIAGNOSIS — T148 Other injury of unspecified body region: Secondary | ICD-10-CM

## 2015-10-31 DIAGNOSIS — J189 Pneumonia, unspecified organism: Secondary | ICD-10-CM | POA: Diagnosis not present

## 2015-10-31 LAB — GLUCOSE, CAPILLARY
Glucose-Capillary: 215 mg/dL — ABNORMAL HIGH (ref 65–99)
Glucose-Capillary: 154 mg/dL — ABNORMAL HIGH (ref 65–99)
Glucose-Capillary: 112 mg/dL — ABNORMAL HIGH (ref 65–99)

## 2015-10-31 LAB — CBC
HEMATOCRIT: 40.5 % (ref 36.0–46.0)
Hemoglobin: 13.2 g/dL (ref 12.0–15.0)
MCH: 28.9 pg (ref 26.0–34.0)
MCHC: 32.6 g/dL (ref 30.0–36.0)
MCV: 88.6 fL (ref 78.0–100.0)
Platelets: 143 10*3/uL — ABNORMAL LOW (ref 150–400)
RBC: 4.57 MIL/uL (ref 3.87–5.11)
RDW: 14 % (ref 11.5–15.5)
WBC: 5.6 10*3/uL (ref 4.0–10.5)

## 2015-10-31 LAB — COMPREHENSIVE METABOLIC PANEL
ALK PHOS: 54 U/L (ref 38–126)
ALT: 62 U/L — AB (ref 14–54)
ANION GAP: 11 (ref 5–15)
AST: 73 U/L — ABNORMAL HIGH (ref 15–41)
Albumin: 3.6 g/dL (ref 3.5–5.0)
BILIRUBIN TOTAL: 1 mg/dL (ref 0.3–1.2)
BUN: 15 mg/dL (ref 6–20)
CALCIUM: 8.6 mg/dL — AB (ref 8.9–10.3)
CO2: 25 mmol/L (ref 22–32)
CREATININE: 0.79 mg/dL (ref 0.44–1.00)
Chloride: 101 mmol/L (ref 101–111)
Glucose, Bld: 261 mg/dL — ABNORMAL HIGH (ref 65–99)
Potassium: 3.5 mmol/L (ref 3.5–5.1)
SODIUM: 137 mmol/L (ref 135–145)
TOTAL PROTEIN: 6.9 g/dL (ref 6.5–8.1)

## 2015-10-31 LAB — URINALYSIS, ROUTINE W REFLEX MICROSCOPIC
BILIRUBIN URINE: NEGATIVE
Glucose, UA: 500 mg/dL — AB
KETONES UR: 15 mg/dL — AB
NITRITE: NEGATIVE
PH: 6.5 (ref 5.0–8.0)
Protein, ur: 30 mg/dL — AB
Specific Gravity, Urine: 1.014 (ref 1.005–1.030)

## 2015-10-31 LAB — URINE MICROSCOPIC-ADD ON

## 2015-10-31 LAB — PROTIME-INR
INR: 3.22 — ABNORMAL HIGH (ref 0.00–1.49)
PROTHROMBIN TIME: 32.3 s — AB (ref 11.6–15.2)

## 2015-10-31 LAB — LIPASE, BLOOD: Lipase: 29 U/L (ref 11–51)

## 2015-10-31 MED ORDER — LISINOPRIL 20 MG PO TABS
20.0000 mg | ORAL_TABLET | Freq: Every day | ORAL | Status: DC
  Administered 2015-10-31 – 2015-11-02 (×3): 20 mg via ORAL
  Filled 2015-10-31 (×3): qty 1

## 2015-10-31 MED ORDER — ALUM & MAG HYDROXIDE-SIMETH 200-200-20 MG/5ML PO SUSP: 30.0000 mL | Freq: Four times a day (QID) | ORAL | Status: DC | PRN

## 2015-10-31 MED ORDER — ONDANSETRON HCL 4 MG/2ML IJ SOLN
4.0000 mg | Freq: Four times a day (QID) | INTRAMUSCULAR | Status: DC | PRN
  Administered 2015-10-31: 4 mg via INTRAVENOUS
  Filled 2015-10-31: qty 2

## 2015-10-31 MED ORDER — DEXTROSE 5 % IV SOLN
1.0000 g | INTRAVENOUS | Status: DC
  Administered 2015-11-01 – 2015-11-02 (×2): 1 g via INTRAVENOUS
  Filled 2015-10-31 (×2): qty 10

## 2015-10-31 MED ORDER — INSULIN ASPART 100 UNIT/ML ~~LOC~~ SOLN: 0.0000 [IU] | Freq: Every day | SUBCUTANEOUS | Status: DC

## 2015-10-31 MED ORDER — AZITHROMYCIN 250 MG PO TABS
250.0000 mg | ORAL_TABLET | Freq: Every day | ORAL | Status: DC
Start: 2015-11-01 — End: 2015-11-02
  Administered 2015-11-01: 250 mg via ORAL
  Filled 2015-10-31 (×2): qty 1

## 2015-10-31 MED ORDER — HYDROMORPHONE HCL 1 MG/ML IJ SOLN: 0.5000 mg | INTRAMUSCULAR | Status: DC | PRN

## 2015-10-31 MED ORDER — PANTOPRAZOLE SODIUM 40 MG PO TBEC
40.0000 mg | DELAYED_RELEASE_TABLET | Freq: Every day | ORAL | Status: DC
  Administered 2015-10-31 – 2015-11-02 (×3): 40 mg via ORAL
  Filled 2015-10-31 (×3): qty 1

## 2015-10-31 MED ORDER — ACETAMINOPHEN 650 MG RE SUPP: 650.0000 mg | Freq: Four times a day (QID) | RECTAL | Status: DC | PRN

## 2015-10-31 MED ORDER — AMIODARONE HCL 200 MG PO TABS
200.0000 mg | ORAL_TABLET | Freq: Two times a day (BID) | ORAL | Status: DC
  Administered 2015-10-31 – 2015-11-02 (×3): 200 mg via ORAL
  Filled 2015-10-31 (×6): qty 1

## 2015-10-31 MED ORDER — SODIUM CHLORIDE 0.9% FLUSH: 3.0000 mL | INTRAVENOUS | Status: DC | PRN

## 2015-10-31 MED ORDER — FUROSEMIDE 40 MG PO TABS
40.0000 mg | ORAL_TABLET | Freq: Every day | ORAL | Status: DC
  Administered 2015-10-31 – 2015-11-02 (×3): 40 mg via ORAL
  Filled 2015-10-31 (×3): qty 1

## 2015-10-31 MED ORDER — AZITHROMYCIN 500 MG PO TABS
500.0000 mg | ORAL_TABLET | Freq: Every day | ORAL | Status: AC
  Administered 2015-10-31: 500 mg via ORAL
  Filled 2015-10-31: qty 1

## 2015-10-31 MED ORDER — PRAVASTATIN SODIUM 40 MG PO TABS
40.0000 mg | ORAL_TABLET | Freq: Every day | ORAL | Status: DC
Start: 2015-10-31 — End: 2015-11-02
  Administered 2015-10-31 – 2015-11-02 (×3): 40 mg via ORAL
  Filled 2015-10-31 (×3): qty 1

## 2015-10-31 MED ORDER — CLONIDINE HCL 0.1 MG PO TABS
0.1000 mg | ORAL_TABLET | Freq: Two times a day (BID) | ORAL | Status: DC
  Filled 2015-10-31 (×2): qty 1

## 2015-10-31 MED ORDER — ACETAMINOPHEN 325 MG PO TABS
650.0000 mg | ORAL_TABLET | Freq: Once | ORAL | Status: AC
  Administered 2015-10-31: 650 mg via ORAL
  Filled 2015-10-31: qty 2

## 2015-10-31 MED ORDER — NITROGLYCERIN 0.4 MG SL SUBL: 0.4000 mg | SUBLINGUAL_TABLET | SUBLINGUAL | Status: DC | PRN

## 2015-10-31 MED ORDER — OXYCODONE HCL 5 MG PO TABS: 5.0000 mg | ORAL_TABLET | ORAL | Status: DC | PRN

## 2015-10-31 MED ORDER — DOCUSATE SODIUM 100 MG PO CAPS
100.0000 mg | ORAL_CAPSULE | Freq: Two times a day (BID) | ORAL | Status: DC
  Administered 2015-10-31 – 2015-11-02 (×5): 100 mg via ORAL
  Filled 2015-10-31 (×5): qty 1

## 2015-10-31 MED ORDER — SODIUM CHLORIDE 0.9% FLUSH
3.0000 mL | Freq: Two times a day (BID) | INTRAVENOUS | Status: DC
  Administered 2015-10-31 – 2015-11-01 (×2): 3 mL via INTRAVENOUS

## 2015-10-31 MED ORDER — INSULIN ASPART 100 UNIT/ML ~~LOC~~ SOLN
0.0000 [IU] | Freq: Three times a day (TID) | SUBCUTANEOUS | Status: DC
  Administered 2015-10-31: 3 [IU] via SUBCUTANEOUS
  Administered 2015-10-31 – 2015-11-01 (×2): 5 [IU] via SUBCUTANEOUS
  Administered 2015-11-01 – 2015-11-02 (×2): 2 [IU] via SUBCUTANEOUS
  Administered 2015-11-02: 5 [IU] via SUBCUTANEOUS

## 2015-10-31 MED ORDER — DEXTROSE 5 % IV SOLN
1.0000 g | Freq: Once | INTRAVENOUS | Status: AC
  Administered 2015-10-31: 1 g via INTRAVENOUS
  Filled 2015-10-31: qty 10

## 2015-10-31 MED ORDER — SODIUM CHLORIDE 0.9% FLUSH
3.0000 mL | Freq: Two times a day (BID) | INTRAVENOUS | Status: DC
  Administered 2015-10-31 – 2015-11-01 (×3): 3 mL via INTRAVENOUS

## 2015-10-31 MED ORDER — SODIUM CHLORIDE 0.9 % IV SOLN: 250.0000 mL | INTRAVENOUS | Status: DC | PRN

## 2015-10-31 MED ORDER — ONDANSETRON HCL 4 MG PO TABS: 4.0000 mg | ORAL_TABLET | Freq: Four times a day (QID) | ORAL | Status: DC | PRN

## 2015-10-31 MED ORDER — VITAMIN D3 25 MCG (1000 UNIT) PO TABS
2000.0000 [IU] | ORAL_TABLET | Freq: Every day | ORAL | Status: DC
  Administered 2015-10-31 – 2015-11-02 (×3): 2000 [IU] via ORAL
  Filled 2015-10-31 (×3): qty 2

## 2015-10-31 MED ORDER — ACETAMINOPHEN 325 MG PO TABS: 650.0000 mg | ORAL_TABLET | Freq: Four times a day (QID) | ORAL | Status: DC | PRN

## 2015-10-31 NOTE — ED Provider Notes (Signed)
CSN: [id number]     Arrival date & time 10/30/15  2310 History  By signing my name below, I, Rowan Blase, attest that this documentation has been prepared under the direction and in the presence of Rolland Porter, MD at [phone] . Electronically Signed: Rowan Blase, Scribe. 10/31/2015. 3:35 AM.  Chief Complaint  Patient presents with  . Emesis  . Fever   The history is provided by the patient and a relative. No language interpreter was used.   HPI Comments:  Bianca Mueller is a 82 y.o. female with PMHx of HLD, HTN, CAD, CHF, MI, and DM brought in by EMS who presents to the Emergency Department complaining of tremors in whole body, weakness, dizziness, and nausea onset ~6 hours ago. She states she had been feeling good until 9 PM and when she stood up she felt like she was going to fall backward and her husband had to catch her. Pt reports diffuse, dull, 4/10 headache onset around noon yesterday. She also reports a mild, deep cough occasionally productive of grey sputum onset a week ago. Pt is unsure of fever but reports chills yesterday. She also notes her urine feels hot with elimination. No treatments attempted PTA. Pt reports a fall 4 days ago due to instability; she hit her head on an upholstered chair at that time but denies LOC. She saw a doctor later that day with no acute dx. Pt is currently taking coumadin. Pt denies room-spinning, lightheadedness, vomiting, dysuria or past headaches. Pt denies smoking or secondary exposure. Patient states she "feels good right now".  PCP Dr Glori Bickers  Past Medical History  Diagnosis Date  . Osteoporosis   . Hyperlipidemia   . Hypertension   . Hypercalcemia   . CAD (coronary artery disease)     a. 1989 s/p MI, TPA, PTCA;  b. 06/2014 NSTEMI/Cath: LM nl, LAD 30-40, D2 90, small, LCX 173m OM1 90, RCA Cs2+ 70-80p, 50-652m90d (? ruptured plaque), EF 55-60%-->Med Rx.  . Marland Kitchenoster     past  . Diverticulosis   . Hx of adenomatous colonic polyps 2010    last  colonscopy 2010,Dr ElVira Agar no need for further  colonoscopy given age  . PAF/PAFlutter     a. 06/2014->amio and coumadin started.  . Elevated LFTs     a. 06/2014 Abd U/S: no GB pathology.  . Chronic diastolic CH[place] ([place]estive heart failure) [place] [place]    a. 07/2014 Echo: EF 60-65%, Gr 1 DD, mild to mod TR, PASP 3769m.  . Myocardial infarction ([facility]  . Type II diabetes mellitus ([facility] GERD (gastroesophageal reflux disease)   . Arthritis     "hips" (08/07/2014)   Past Surgical History  Procedure Laterality Date  . Vaginal hysterectomy  1970's?    ovaries intact  . Shoulder open rotator cuff repair Right 1980's  . Left heart catheterization with coronary angiogram N/A 07/22/2014    Procedure: LEFT HEART CATHETERIZATION WITH CORONARY ANGIOGRAM;  Surgeon: HenSinclair GroomsD;  [facility];  Service: Cardiovascular;  Laterality: N/A;  . Cardiac catheterization  07/22/2014  . Coronary angioplasty  1989    "@ [facility]"   Family History  Problem Relation Age of Onset  . Heart disease Mother     CAD  . Heart disease Father     CAD  . Heart disease Brother     CAD   Social History  Substance Use Topics  . Smoking status: Former Smoker -- 0.25  packs/day for 30 years    Types: Cigarettes  . Smokeless tobacco: Never Used     Comment: "quit smoking in the 1980's"  . Alcohol Use: No   Lives at home  Live with spouse Uses a walker at times.  OB History    No data available     Review of Systems  Constitutional: Positive for chills.  Respiratory: Positive for cough.   Gastrointestinal: Positive for nausea. Negative for vomiting.  Genitourinary: Negative for dysuria.  Neurological: Positive for dizziness, tremors, weakness and headaches. Negative for syncope and light-headedness.  All other systems reviewed and are negative.  Allergies  Alendronate sodium; Doxycycline; Isosorbide; Lidocaine; Metformin and related; Metronidazole; and Nifedipine  Home Medications    Prior to Admission medications   Medication Sig Start Date End Date Taking? Authorizing Provider  ACCU-CHEK FASTCLIX LANCETS MISC Use as instructed to test blood sugar once daily or as needed.  Diagnosis:  E11.9   Non insulin-dependent. 12/08/14   Abner Greenspan, MD  amiodarone (PACERONE) 200 MG tablet TAKE 1 TABLET TWICE DAILY 09/30/15   Abner Greenspan, MD  Blood Glucose Monitoring Suppl (ACCU-CHEK AVIVA PLUS) W/DEVICE KIT Use as instructed to test blood sugar once daily or as needed.  Diagnosis:  E11.9  Non insulin-dependent. 12/08/14   Abner Greenspan, MD  cholecalciferol (VITAMIN D) 1000 UNITS tablet Take 2,000 Units by mouth daily at 12 noon.     Historical Provider, MD  cloNIDine (CATAPRES) 0.1 MG tablet Take 1 tablet (0.1 mg total) by mouth 2 (two) times daily. 10/29/15   Minna Merritts, MD  Cyanocobalamin (VITAMIN B-12 PO) Take 1 tablet by mouth daily at 12 noon.     Historical Provider, MD  docusate sodium 100 MG CAPS Take 100 mg by mouth 2 (two) times daily. 07/29/14   Rhonda G Barrett, PA-C  esomeprazole (NEXIUM) 40 MG capsule Take 1 capsule (40 mg total) by mouth daily at 12 noon. Patient taking differently: Take 40 mg by mouth at bedtime.  09/24/14   Abner Greenspan, MD  furosemide (LASIX) 40 MG tablet Take 1 tablet (40 mg total) by mouth 2 (two) times daily. As directed by cardiologist Patient taking differently: Take 40 mg by mouth daily. As directed by cardiologist, Pt may take additional doses as needed 09/24/14   Abner Greenspan, MD  glucose blood test strip Use as instructed to test blood sugar once daily or as needed.  Diagnosis:  E11.9   Non insulin-dependent.    Historical Provider, MD  glucose blood test strip Use as instructed to check blood sugar once daily or as needed.  Diagnosis:  E11.9   Non insulin-dependent. 12/08/14   Abner Greenspan, MD  lisinopril (PRINIVIL,ZESTRIL) 20 MG tablet TAKE 1 TABLET EVERY DAY Patient taking differently: TAKE 1 TABLET EVERY DAY AT NOON 06/08/15   Abner Greenspan, MD  metoprolol tartrate (LOPRESSOR) 25 MG tablet Take 0.5 tablets (12.5 mg total) by mouth 2 (two) times daily. 09/18/15   Lajean Saver, MD  nitroGLYCERIN (NITROSTAT) 0.4 MG SL tablet Place 1 tablet (0.4 mg total) under the tongue every 5 (five) minutes as needed. For chest pain and if not improved seek medical attention in ER. 07/03/13   Abner Greenspan, MD  pravastatin (PRAVACHOL) 40 MG tablet Take 1 tablet (40 mg total) by mouth daily. 11/18/14   Minna Merritts, MD  sitaGLIPtin (JANUVIA) 100 MG tablet Take 1 tablet (100 mg total) by mouth daily. 09/24/14  Abner Greenspan, MD  warfarin (COUMADIN) 3 MG tablet Take as directed by anti-coagulation clinic 09/30/15   Abner Greenspan, MD   BP 138/54 mmHg  Pulse 68  Temp(Src) 99.2 F (37.3 C) (Oral)  Resp 17  SpO2 97%  Vital signs normal   Physical Exam  Constitutional: She is oriented to person, place, and time. She appears well-developed and well-nourished.  Non-toxic appearance. She does not appear ill. No distress.  Patient has lost most of her scalp hair  HENT:  Head: Normocephalic.  Right Ear: External ear normal.  Left Ear: External ear normal.  Nose: Nose normal. No mucosal edema or rhinorrhea.  Mouth/Throat: Oropharynx is clear and moist and mucous membranes are normal. No dental abscesses or uvula swelling.  Redness to both cheeks with bruising around left lateral eyelid and abrasion on left cheek and left forehead.  Eyes: Conjunctivae and EOM are normal. Pupils are equal, round, and reactive to light.  Neck: Normal range of motion and full passive range of motion without pain. Neck supple.  Cardiovascular: Normal rate, regular rhythm and normal heart sounds.  Exam reveals no gallop and no friction rub.   No murmur heard. Pulmonary/Chest: Effort normal and breath sounds normal. No respiratory distress. She has no wheezes. She has no rhonchi. She has no rales. She exhibits no tenderness and no crepitus.  Abdominal: Soft. Normal  appearance and bowel sounds are normal. She exhibits no distension. There is no tenderness. There is no rebound and no guarding.  Musculoskeletal: Normal range of motion. She exhibits no edema or tenderness.  Moves all extremities well.   Neurological: She is alert and oriented to person, place, and time. She has normal strength. No cranial nerve deficit.  Able to sit up without dizziness. Grips are equal. FTN was coordinated BL.  Skin: Skin is warm, dry and intact. No rash noted. No erythema. No pallor.  Psychiatric: She has a normal mood and affect. Her speech is normal and behavior is normal. Her mood appears not anxious.  Nursing note and vitals reviewed.      ED Course  Procedures   Medications  cefTRIAXone (ROCEPHIN) 1 g in dextrose 5 % 50 mL IVPB (not administered)  cefTRIAXone (ROCEPHIN) 1 g in dextrose 5 % 50 mL IVPB (0 g Intravenous Stopped 10/31/15 0331)  acetaminophen (TYLENOL) tablet 650 mg (650 mg Oral Given 10/31/15 0322)   e DIAGNOSTIC STUDIES:  Oxygen Saturation is 90% on RA, low by my interpretation.    COORDINATION OF CARE:  2:24 AM Will prescribe antibiotics and order imaging. Will speak to admitting physician when imaging returns. Discussed treatment plan with pt at bedside and pt agreed to plan.  She was given Rocephin IV for her urinary tract infection. CT scan of her head and maxillofacial bones was added due to her recent trauma and patient being on Coumadin. Due to her cough for the past week she had a chest x-ray done. I explained to the patient and her daughter that as soon as the CT scan results that I would speak to the hospitalist about getting her admitted.  Patient was noted to be hypertensive however her blood pressure improved to 138/54 with heart rate of 71 without treatment in the emergency department.  04:43 Dr Arnoldo Morale, admit to obs, telemetry  Labs Review Results for orders placed or performed during the hospital encounter of 10/30/15   Lipase, blood  Result Value Ref Range   Lipase 29 11 - 51 U/L  Comprehensive  metabolic panel  Result Value Ref Range   Sodium 137 135 - 145 mmol/L   Potassium 3.5 3.5 - 5.1 mmol/L   Chloride 101 101 - 111 mmol/L   CO2 25 22 - 32 mmol/L   Glucose, Bld 261 (H) 65 - 99 mg/dL   BUN 15 6 - 20 mg/dL   Creatinine, Ser 0.79 0.44 - 1.00 mg/dL   Calcium 8.6 (L) 8.9 - 10.3 mg/dL   Total Protein 6.9 6.5 - 8.1 g/dL   Albumin 3.6 3.5 - 5.0 g/dL   AST 73 (H) 15 - 41 U/L   ALT 62 (H) 14 - 54 U/L   Alkaline Phosphatase 54 38 - 126 U/L   Total Bilirubin 1.0 0.3 - 1.2 mg/dL   GFR calc non Af Amer >60 >60 mL/min   GFR calc Af Amer >60 >60 mL/min   Anion gap 11 5 - 15  CBC  Result Value Ref Range   WBC 5.6 4.0 - 10.5 K/uL   RBC 4.57 3.87 - 5.11 MIL/uL   Hemoglobin 13.2 12.0 - 15.0 g/dL   HCT 40.5 36.0 - 46.0 %   MCV 88.6 78.0 - 100.0 fL   MCH 28.9 26.0 - 34.0 pg   MCHC 32.6 30.0 - 36.0 g/dL   RDW 14.0 11.5 - 15.5 %   Platelets 143 (L) 150 - 400 K/uL  Urinalysis, Routine w reflex microscopic (not at [facility])  Result Value Ref Range   Color, Urine YELLOW YELLOW   APPearance CLOUDY (A) CLEAR   Specific Gravity, Urine 1.014 1.005 - 1.030   pH 6.5 5.0 - 8.0   Glucose, UA 500 (A) NEGATIVE mg/dL   Hgb urine dipstick SMALL (A) NEGATIVE   Bilirubin Urine NEGATIVE NEGATIVE   Ketones, ur 15 (A) NEGATIVE mg/dL   Protein, ur 30 (A) NEGATIVE mg/dL   Nitrite NEGATIVE NEGATIVE   Leukocytes, UA MODERATE (A) NEGATIVE  Urine microscopic-add on  Result Value Ref Range   Squamous Epithelial / LPF 6-30 (A) NONE SEEN   WBC, UA TOO NUMEROUS TO COUNT 0 - 5 WBC/hpf   RBC / HPF TOO NUMEROUS TO COUNT 0 - 5 RBC/hpf   Bacteria, UA MANY (A) NONE SEEN   Casts GRANULAR CAST (A) NEGATIVE  Protime-INR  Result Value Ref Range   Prothrombin Time 32.3 (H) 11.6 - 15.2 seconds   INR 3.22 (H) 0.00 - 1.49    Laboratory interpretation all normal except over therapeutic INR, urinary tract infection, urine culture was sent,  hyperglycemia   Dg Chest 2 View  10/31/2015  CLINICAL DATA:  82 year old female with cough EXAM: CHEST  2 VIEW COMPARISON:  Radiograph dated 10/27/2015 and CT dated 07/18/2014 FINDINGS: Two views of the chest demonstrate stable appearing left lung base densities similar to prior study which may represent atelectasis/scarring. Areas of linear and reticular densities at the right lung base appear similar or slightly more prominent compared to prior study and also may represent atelectasis/scarring. Developing pneumonia is not excluded. There is no focal consolidation, pleural effusion, or pneumothorax. Stable cardiac silhouette. No acute osseous pathology. IMPRESSION: Bibasilar atelectasis/ scarring. Pneumonia is not excluded. Clinical correlation is recommended. Electronically Signed   By: Anner Crete M.D.   On: 10/31/2015 03:16   Dg Chest 2 View  10/27/2015  CLINICAL DATA:  One week of cough, weakness ; patient fell this morning striking his space on a chair, history of previous smoking, coronary artery disease and CHF. EXAM: CHEST  2 VIEW COMPARISON:  PA  and lateral chest x-ray of February 26, 2015 FINDINGS: The lungs are mildly hyperinflated with hemidiaphragm flattening. There is subtle increased density at the left lung base which is not entirely new but is more conspicuous than on the previous study. The cardiac silhouette remains mildly enlarged. The pulmonary vascularity is normal. There is tortuosity of the ascending and descending thoracic aorta with mural calcification. The mediastinum is normal in width. There is no pleural effusion. The bony thorax exhibits no acute abnormality. IMPRESSION: COPD with minimal left basilar subsegmental atelectasis and scarring. There is no alveolar pneumonia. Mild chronic enlargement of the cardiac silhouette without evidence of pulmonary vascular congestion or pulmonary edema. The observed portions of the bony thorax exhibit no acute abnormalities. Electronically  Signed   By: David  Martinique M.D.   On: 10/27/2015 11:20   Ct Head Wo Contrast Ct Maxillofacial Wo Cm  10/31/2015  CLINICAL DATA:  82 year old female with fall EXAM: CT HEAD WITHOUT CONTRAST CT MAXILLOFACIAL WITHOUT CONTRAST TECHNIQUE: Multidetector CT imaging of the head and maxillofacial structures were performed using the standard protocol without intravenous contrast. Multiplanar CT image reconstructions of the maxillofacial structures were also generated. COMPARISON:  CT dated 02/31/2016 FINDINGS: CT HEAD FINDINGS There is prominence of the ventricles and sulci compatible with age-related atrophy. Periventricular and deep white matter chronic microvascular ischemic changes noted. There is no acute intracranial hemorrhage. No mass effect or midline shift. There is mucoperiosteal thickening and partial opacification of the visualized paranasal sinuses. The mastoid air cells are clear. The calvarium is intact. CT MAXILLOFACIAL FINDINGS There is no acute facial bone fractures. The maxilla, mandible, and pterygoid plates are intact. The globes, retro-orbital fat, and orbital walls are preserved. There is partial opacification of the maxillary sinuses bilaterally. The soft tissues are unremarkable. IMPRESSION: No acute intracranial hemorrhage. No acute/ traumatic ischial bone fractures. Electronically Signed   By: Anner Crete M.D.   On: 10/31/2015 02:59       MDM   Final diagnoses:  Weakness  Urinary tract infection without hematuria, site unspecified    Plan admission  Rolland Porter, MD, FACEP   I personally performed the services described in this documentation, which was scribed in my presence. The recorded information has been reviewed and considered.  Rolland Porter, MD, Barbette Or, MD 10/31/15 [phone]

## 2015-10-31 NOTE — H&P (Addendum)
[facility] Admission History and Physical       Bianca Mueller LSL:[id number] DOB: Oct 14, 1925 DOA: 10/30/2015  Referring physician: EDP PCP: Loura Pardon, MD  Specialists:   Chief Complaint: Weakness  HPI: Bianca Mueller is a 82 y.o. female with a history of CAD, Atrial Fibrillation on Coumadin Rx, HTN, Diastolic CHF, DM2 and Hyperlipidemia who presents to the ED with complaints of increased weakness and dizziness and feeling as if she would fall.   She suffered a fall 3 days ago which caused facial bruising.  Of Note:  she did see her PCP the same day of her fall and was evaluated.  She reports having nausea and vomiting and productive coughing this past week.  She denies having fevers and chills and dysuria.   Her labs in the ED revealed that she has a UTI, and a Urine culture was sent and she was placed on IV Rocephin.   A CT scan of the Head and Maxillofacial areas were performed as well and were negative for acute findings.   She was referred for admission.      Review of Systems:  Constitutional: No Weight Loss, No Weight Gain, Night Sweats, Fevers, Chills, +Dizziness, Light Headedness, Fatigue, +Generalized Weakness HEENT: No Headaches, Difficulty Swallowing,Tooth/Dental Problems,Sore Throat,  No Sneezing, Rhinitis, Ear Ache, Nasal Congestion, or Post Nasal Drip,  Cardio-vascular:  No Chest pain, Orthopnea, PND, Edema in Lower Extremities, Anasarca, Dizziness, Palpitations  Resp: No Dyspnea, No DOE, +Productive Cough, No Hemoptysis, No Wheezing.    GI: No Heartburn, Indigestion, Abdominal Pain, +Nausea, +Vomiting, Diarrhea, Constipation, Hematemesis, Hematochezia, Melena, Change in Bowel Habits,  Loss of Appetite  GU: No Dysuria, No Change in Color of Urine, No Urgency or Urinary Frequency, No Flank pain.  Musculoskeletal: No Joint Pain or Swelling, No Decreased Range of Motion, No Back Pain.  Neurologic: No Syncope, No Seizures, Muscle Weakness, Paresthesia, Vision  Disturbance or Loss, No Diplopia, No Difficulty Walking,  Skin: No Rash or Lesions. Psych: No Change in Mood or Affect, No Depression or Anxiety, No Memory loss, No Confusion, or Hallucinations   Past Medical History  Diagnosis Date  . Osteoporosis   . Hyperlipidemia   . Hypertension   . Hypercalcemia   . CAD (coronary artery disease)     a. 1989 s/p MI, TPA, PTCA;  b. 06/2014 NSTEMI/Cath: LM nl, LAD 30-40, D2 90, small, LCX 161m OM1 90, RCA Cs2+ 70-80p, 50-642m90d (? ruptured plaque), EF 55-60%-->Med Rx.  . Marland Kitchenoster     past  . Diverticulosis   . Hx of adenomatous colonic polyps 2010    last colonscopy 2010,Dr ElVira Agar no need for further  colonoscopy given age  . PAF/PAFlutter     a. 06/2014->amio and coumadin started.  . Elevated LFTs     a. 06/2014 Abd U/S: no GB pathology.  . Chronic diastolic CHF (congestive heart failure) ([facility]    a. 07/2014 Echo: EF 60-65%, Gr 1 DD, mild to mod TR, PASP 3782m.  . Myocardial infarction ([facility]  . Type II diabetes mellitus ([facility] GERD (gastroesophageal reflux disease)   . Arthritis     "hips" (08/07/2014)     Past Surgical History  Procedure Laterality Date  . Vaginal hysterectomy  1970's?    ovaries intact  . Shoulder open rotator cuff repair Right 1980's  . Left heart catheterization with coronary angiogram N/A 07/22/2014    Procedure: LEFT HEART CATHETERIZATION WITH CORONARY ANGIOGRAM;  Surgeon: HenLynnell Dike  Blenda Bridegroom, MD;  Location: [facility] CATH LAB;  Service: Cardiovascular;  Laterality: N/A;  . Cardiac catheterization  07/22/2014  . Coronary angioplasty  1989    "@ [facility]"      Prior to Admission medications   Medication Sig Start Date End Date Taking? Authorizing Provider  ACCU-CHEK FASTCLIX LANCETS MISC Use as instructed to test blood sugar once daily or as needed.  Diagnosis:  E11.9   Non insulin-dependent. 12/08/14   Abner Greenspan, MD  amiodarone (PACERONE) 200 MG tablet TAKE 1 TABLET TWICE DAILY 09/30/15   Abner Greenspan, MD    Blood Glucose Monitoring Suppl (ACCU-CHEK AVIVA PLUS) W/DEVICE KIT Use as instructed to test blood sugar once daily or as needed.  Diagnosis:  E11.9  Non insulin-dependent. 12/08/14   Abner Greenspan, MD  cholecalciferol (VITAMIN D) 1000 UNITS tablet Take 2,000 Units by mouth daily at 12 noon.     Historical Provider, MD  cloNIDine (CATAPRES) 0.1 MG tablet Take 1 tablet (0.1 mg total) by mouth 2 (two) times daily. 10/29/15   Minna Merritts, MD  Cyanocobalamin (VITAMIN B-12 PO) Take 1 tablet by mouth daily at 12 noon.     Historical Provider, MD  docusate sodium 100 MG CAPS Take 100 mg by mouth 2 (two) times daily. 07/29/14   Rhonda G Barrett, PA-C  esomeprazole (NEXIUM) 40 MG capsule Take 1 capsule (40 mg total) by mouth daily at 12 noon. Patient taking differently: Take 40 mg by mouth at bedtime.  09/24/14   Abner Greenspan, MD  furosemide (LASIX) 40 MG tablet Take 1 tablet (40 mg total) by mouth 2 (two) times daily. As directed by cardiologist Patient taking differently: Take 40 mg by mouth daily. As directed by cardiologist, Pt may take additional doses as needed 09/24/14   Abner Greenspan, MD  glucose blood test strip Use as instructed to test blood sugar once daily or as needed.  Diagnosis:  E11.9   Non insulin-dependent.    Historical Provider, MD  glucose blood test strip Use as instructed to check blood sugar once daily or as needed.  Diagnosis:  E11.9   Non insulin-dependent. 12/08/14   Abner Greenspan, MD  lisinopril (PRINIVIL,ZESTRIL) 20 MG tablet TAKE 1 TABLET EVERY DAY Patient taking differently: TAKE 1 TABLET EVERY DAY AT NOON 06/08/15   Abner Greenspan, MD  metoprolol tartrate (LOPRESSOR) 25 MG tablet Take 0.5 tablets (12.5 mg total) by mouth 2 (two) times daily. 09/18/15   Lajean Saver, MD  nitroGLYCERIN (NITROSTAT) 0.4 MG SL tablet Place 1 tablet (0.4 mg total) under the tongue every 5 (five) minutes as needed. For chest pain and if not improved seek medical attention in ER. 07/03/13   Abner Greenspan, MD  pravastatin (PRAVACHOL) 40 MG tablet Take 1 tablet (40 mg total) by mouth daily. 11/18/14   Minna Merritts, MD  sitaGLIPtin (JANUVIA) 100 MG tablet Take 1 tablet (100 mg total) by mouth daily. 09/24/14   Abner Greenspan, MD  warfarin (COUMADIN) 3 MG tablet Take as directed by anti-coagulation clinic 09/30/15   Abner Greenspan, MD     Allergies  Allergen Reactions  . Alendronate Sodium   . Doxycycline Other (See Comments)    Inc LFT  . Isosorbide Other (See Comments)  . Lidocaine Nausea Only    Given at Dentist office  . Metformin And Related     Decreases appetite resulting in weight loss  . Metronidazole  REACTION: sick/ GI  . Nifedipine Other (See Comments)    Social History:  reports that she has quit smoking. Her smoking use included Cigarettes. She has a 7.5 pack-year smoking history. She has never used smokeless tobacco. She reports that she does not drink alcohol or use illicit drugs.    Family History  Problem Relation Age of Onset  . Heart disease Mother     CAD  . Heart disease Father     CAD  . Heart disease Brother     CAD       Physical Exam:  GEN:  Pleasant Elderly Thin  82 y.o. Caucasian female examined and in no acute distress; cooperative with exam Filed Vitals:   10/30/15 2323 10/31/15 0155 10/31/15 0158 10/31/15 0323  BP: 161/77 138/54    Pulse: 77   68  Temp: 99.4 F (37.4 C)  99.2 F (37.3 C)   TempSrc: Oral  Oral   Resp: 24 17    SpO2: 93%  90% 97%   Blood pressure 138/54, pulse 68, temperature 99.2 F (37.3 C), temperature source Oral, resp. rate 17, SpO2 97 %. PSYCH: She is alert and oriented x4; does not appear anxious does not appear depressed; affect is normal HEENT: Normocephalic and + Ecchymosis of the mid forehead and above the Left Eye.  Mucous membranes pink; PERRLA; EOM intact; Fundi:  Benign;  No scleral icterus, Nares: Patent, Oropharynx: Clear, Fair Dentition,    Neck:  FROM, No Cervical Lymphadenopathy nor Thyromegaly or  Carotid Bruit; No JVD; Breasts:: Not examined CHEST WALL: No tenderness CHEST: Normal respiration, clear to auscultation bilaterally HEART: Regular rate and rhythm; no murmurs rubs or gallops BACK: No kyphosis or scoliosis; No CVA tenderness ABDOMEN: Positive Bowel Sounds, Soft Non-Tender, No Rebound or Guarding; No Masses, No Organomegaly. Rectal Exam: Not done EXTREMITIES: No Cyanosis, Clubbing, or Edema; No Ulcerations. Genitalia: not examined PULSES: 2+ and symmetric SKIN: Normal hydration no rash or ulceration CNS:  Alert and Oriented x 4, No Focal Deficits Vascular: pulses palpable throughout    Labs on Admission:  Basic Metabolic Panel:  Recent Labs Lab 10/27/15 1035 10/30/15 2359  NA 135 137  K 3.6 3.5  CL 97 101  CO2 27 25  GLUCOSE 236* 261*  BUN 18 15  CREATININE 0.92 0.79  CALCIUM 9.3 8.6*   Liver Function Tests:  Recent Labs Lab 10/30/15 2359  AST 73*  ALT 62*  ALKPHOS 54  BILITOT 1.0  PROT 6.9  ALBUMIN 3.6    Recent Labs Lab 10/30/15 2359  LIPASE 29   No results for input(s): AMMONIA in the last 168 hours. CBC:  Recent Labs Lab 10/27/15 1035 10/30/15 2359  WBC 6.8 5.6  NEUTROABS 5.5  --   HGB 14.5 13.2  HCT 44.4 40.5  MCV 89.1 88.6  PLT 186.0 143*   Cardiac Enzymes: No results for input(s): CKTOTAL, CKMB, CKMBINDEX, TROPONINI in the last 168 hours.  BNP (last 3 results) No results for input(s): BNP in the last 8760 hours.  ProBNP (last 3 results) No results for input(s): PROBNP in the last 8760 hours.  CBG: No results for input(s): GLUCAP in the last 168 hours.  Radiological Exams on Admission: Dg Chest 2 View  10/31/2015  CLINICAL DATA:  82 year old female with cough EXAM: CHEST  2 VIEW COMPARISON:  Radiograph dated 10/27/2015 and CT dated 07/18/2014 FINDINGS: Two views of the chest demonstrate stable appearing left lung base densities similar to prior study which may represent atelectasis/scarring.  Areas of linear and  reticular densities at the right lung base appear similar or slightly more prominent compared to prior study and also may represent atelectasis/scarring. Developing pneumonia is not excluded. There is no focal consolidation, pleural effusion, or pneumothorax. Stable cardiac silhouette. No acute osseous pathology. IMPRESSION: Bibasilar atelectasis/ scarring. Pneumonia is not excluded. Clinical correlation is recommended. Electronically Signed   By: Anner Crete M.D.   On: 10/31/2015 03:16   Ct Head Wo Contrast  10/31/2015  CLINICAL DATA:  82 year old female with fall EXAM: CT HEAD WITHOUT CONTRAST CT MAXILLOFACIAL WITHOUT CONTRAST TECHNIQUE: Multidetector CT imaging of the head and maxillofacial structures were performed using the standard protocol without intravenous contrast. Multiplanar CT image reconstructions of the maxillofacial structures were also generated. COMPARISON:  CT dated 02/31/2016 FINDINGS: CT HEAD FINDINGS There is prominence of the ventricles and sulci compatible with age-related atrophy. Periventricular and deep white matter chronic microvascular ischemic changes noted. There is no acute intracranial hemorrhage. No mass effect or midline shift. There is mucoperiosteal thickening and partial opacification of the visualized paranasal sinuses. The mastoid air cells are clear. The calvarium is intact. CT MAXILLOFACIAL FINDINGS There is no acute facial bone fractures. The maxilla, mandible, and pterygoid plates are intact. The globes, retro-orbital fat, and orbital walls are preserved. There is partial opacification of the maxillary sinuses bilaterally. The soft tissues are unremarkable. IMPRESSION: No acute intracranial hemorrhage. No acute/ traumatic ischial bone fractures. Electronically Signed   By: Anner Crete M.D.   On: 10/31/2015 02:59   Ct Maxillofacial Wo Cm  10/31/2015  CLINICAL DATA:  82 year old female with fall EXAM: CT HEAD WITHOUT CONTRAST CT MAXILLOFACIAL WITHOUT  CONTRAST TECHNIQUE: Multidetector CT imaging of the head and maxillofacial structures were performed using the standard protocol without intravenous contrast. Multiplanar CT image reconstructions of the maxillofacial structures were also generated. COMPARISON:  CT dated 02/31/2016 FINDINGS: CT HEAD FINDINGS There is prominence of the ventricles and sulci compatible with age-related atrophy. Periventricular and deep white matter chronic microvascular ischemic changes noted. There is no acute intracranial hemorrhage. No mass effect or midline shift. There is mucoperiosteal thickening and partial opacification of the visualized paranasal sinuses. The mastoid air cells are clear. The calvarium is intact. CT MAXILLOFACIAL FINDINGS There is no acute facial bone fractures. The maxilla, mandible, and pterygoid plates are intact. The globes, retro-orbital fat, and orbital walls are preserved. There is partial opacification of the maxillary sinuses bilaterally. The soft tissues are unremarkable. IMPRESSION: No acute intracranial hemorrhage. No acute/ traumatic ischial bone fractures. Electronically Signed   By: Anner Crete M.D.   On: 10/31/2015 02:59      Assessment/Plan:      82 y.o. female with  Principal Problem:     1.    Weakness    Monitor     IV abx for UTI    PT consult    Active Problems:    2.    UTI    IV  Rocephin    Urine Cx sent     3.    Contusion     Ice Packs PRN     4.    Coagulopathy- due to Coumadin Rx    Monitor PT/INR daily and adjust PRN       5.    Atrial fibrillation (HCC)    Continue Amiodarone Rx    On Coumadin Rx      6.    Chronic diastolic CHF (congestive heart failure) (HCC)    On Lasix,  Metoprolol, and Lisinopril  Daily    Monitor I/Os    Avoid Fluid Overload          7.    CAD (coronary artery disease)    On Metoprolol, ASA, Pravastatin and Lisinopril Rx       8.    Essential hypertension    On Lasix, Metoprolol, and Lisinopril  Daily    Monitor  BPs       9.    DM2 (diabetes mellitus, type 2) (HCC)    Hold Januvia Rx    SSI coverage PRN      10.   History of fall    PT Consult in AM    11.   DVT Prophylaxis     On Coumadin Rx       Code Status:     FULL CODE    Family Communication:   Daughter at Bedside  Disposition Plan:   Observation Status        Time spent: 22 Minutes      Theressa Millard [facility] Pager [phone]   If [place] Please Contact the Day Rounding Team MD for [facility]  If 7PM-7AM, Please Contact Night-Floor Coverage  [email or web address] Password [email or web address] 10/31/2015, 5:06 AM     ADDENDUM:   Patient was seen and examined on 10/31/2015

## 2015-10-31 NOTE — Progress Notes (Addendum)
Patient admitted after midnight.  Check orthos PT eval for falls-- describes "falling backwards/dizziness" -r/o UTI/PNA-- rocephin/azithromycin  Eulogio Bear DO

## 2015-11-01 DIAGNOSIS — J189 Pneumonia, unspecified organism: Secondary | ICD-10-CM | POA: Diagnosis not present

## 2015-11-01 DIAGNOSIS — I481 Persistent atrial fibrillation: Secondary | ICD-10-CM | POA: Diagnosis not present

## 2015-11-01 DIAGNOSIS — R531 Weakness: Secondary | ICD-10-CM | POA: Diagnosis not present

## 2015-11-01 DIAGNOSIS — I5032 Chronic diastolic (congestive) heart failure: Secondary | ICD-10-CM | POA: Diagnosis not present

## 2015-11-01 DIAGNOSIS — I10 Essential (primary) hypertension: Secondary | ICD-10-CM | POA: Diagnosis not present

## 2015-11-01 DIAGNOSIS — E1159 Type 2 diabetes mellitus with other circulatory complications: Secondary | ICD-10-CM | POA: Diagnosis not present

## 2015-11-01 LAB — URINE CULTURE: Special Requests: NORMAL

## 2015-11-01 LAB — PROTIME-INR
INR: 2.3 — AB (ref 0.00–1.49)
PROTHROMBIN TIME: 25.1 s — AB (ref 11.6–15.2)

## 2015-11-01 LAB — BASIC METABOLIC PANEL
ANION GAP: 10 (ref 5–15)
BUN: 17 mg/dL (ref 6–20)
CHLORIDE: 99 mmol/L — AB (ref 101–111)
CO2: 28 mmol/L (ref 22–32)
Calcium: 8.5 mg/dL — ABNORMAL LOW (ref 8.9–10.3)
Creatinine, Ser: 0.85 mg/dL (ref 0.44–1.00)
GFR calc Af Amer: >60 mL/min (ref >60)
GFR calc non Af Amer: 60 mL/min — ABNORMAL LOW (ref >60)
Glucose, Bld: 115 mg/dL — ABNORMAL HIGH (ref 65–99)
POTASSIUM: 2.9 mmol/L — AB (ref 3.5–5.1)
SODIUM: 137 mmol/L (ref 135–145)

## 2015-11-01 LAB — GLUCOSE, CAPILLARY
GLUCOSE-CAPILLARY: 140 mg/dL — AB (ref 65–99)
Glucose-Capillary: 103 mg/dL — ABNORMAL HIGH (ref 65–99)
Glucose-Capillary: 210 mg/dL — ABNORMAL HIGH (ref 65–99)
Glucose-Capillary: 156 mg/dL — ABNORMAL HIGH (ref 65–99)

## 2015-11-01 LAB — CBC
HCT: 40.5 % (ref 36.0–46.0)
HEMOGLOBIN: 13.1 g/dL (ref 12.0–15.0)
MCH: 29.2 pg (ref 26.0–34.0)
MCHC: 32.3 g/dL (ref 30.0–36.0)
MCV: 90.2 fL (ref 78.0–100.0)
Platelets: 135 10*3/uL — ABNORMAL LOW (ref 150–400)
RBC: 4.49 MIL/uL (ref 3.87–5.11)
RDW: 14.1 % (ref 11.5–15.5)
WBC: 7 10*3/uL (ref 4.0–10.5)

## 2015-11-01 MED ORDER — WARFARIN - PHARMACIST DOSING INPATIENT: Freq: Every day | Status: DC

## 2015-11-01 MED ORDER — METOPROLOL TARTRATE 12.5 MG HALF TABLET
12.5000 mg | ORAL_TABLET | Freq: Two times a day (BID) | ORAL | Status: DC
  Administered 2015-11-01 – 2015-11-02 (×3): 12.5 mg via ORAL
  Filled 2015-11-01 (×4): qty 1

## 2015-11-01 MED ORDER — WARFARIN SODIUM 3 MG PO TABS
3.0000 mg | ORAL_TABLET | Freq: Once | ORAL | Status: DC
  Filled 2015-11-01: qty 1

## 2015-11-01 MED ORDER — VITAMIN B-12 1000 MCG PO TABS
1000.0000 ug | ORAL_TABLET | Freq: Every day | ORAL | Status: DC
  Administered 2015-11-01 – 2015-11-02 (×2): 1000 ug via ORAL
  Filled 2015-11-01 (×2): qty 1

## 2015-11-01 MED ORDER — POTASSIUM CHLORIDE CRYS ER 20 MEQ PO TBCR
40.0000 meq | EXTENDED_RELEASE_TABLET | ORAL | Status: AC
  Administered 2015-11-01 (×3): 40 meq via ORAL
  Filled 2015-11-01 (×3): qty 2

## 2015-11-01 NOTE — Evaluation (Signed)
Physical Therapy Evaluation Patient Details Name: Bianca Mueller MRN: [id number] DOB: 03-01-1926 Today's Date: 11/01/2015   History of Present Illness  82 y.o. female with a history of CAD, Atrial Fibrillation on Coumadin, HTN, Diastolic CHF, DM2 and Hyperlipidemia who presents to the ED with complaints of increased weakness and dizziness and feeling as if she would fall  Clinical Impression  Pt admitted with above diagnosis. Pt currently with functional limitations due to the deficits listed below (see PT Problem List).  Pt will benefit from skilled PT to increase their independence and safety with mobility to allow discharge to the venue listed below.  Pt with negative vestibular exam and no symptoms elicited.  Pt does present with balance deficits.  Pt not agreeable to outpatient PT for balance training however would accept HHPT.    11/01/15 0909  Vestibular Assessment  General Observation Pt reports a spinning sensation upon getting ready to get into bed prior to admission.  She states she layed on the bed and the sensation was relieved.  She has not had any symptoms since this one occurrence and none were elicited during vestibular testing.  Pt does report poor balance and falling at home due to LOB backwards.  Symptom Behavior  Type of Dizziness Spinning  Frequency of Dizziness once just prior to admission  Duration of Dizziness short - stopped after laying on bed  Aggravating Factors Spontaneous onset  Relieving Factors Lying supine  Occulomotor Exam  Occulomotor Alignment Normal (abrasion from fall to L eye facial area)  Spontaneous Absent  Gaze-induced Absent  Head shaking Horizontal Absent  Head Shaking Vertical Absent  Smooth Pursuits Intact  Saccades Intact  Vestibulo-Occular Reflex  VOR 1 Head Only (x 1 viewing) normal  Positional Testing  Dix-Hallpike Dix-Hallpike Right;Dix-Hallpike Left  Horizontal Canal Testing Horizontal Canal Left;Horizontal Canal Right   Dix-Hallpike Right  Dix-Hallpike Right Symptoms No nystagmus  Dix-Hallpike Left  Dix-Hallpike Left Symptoms No nystagmus  Horizontal Canal Right  Horizontal Canal Right Symptoms Normal  Horizontal Canal Left  Horizontal Canal Left Symptoms Normal       Follow Up Recommendations Home health PT;Supervision for mobility/OOB    Equipment Recommendations  None recommended by PT    Recommendations for Other Services       Precautions / Restrictions Precautions Precautions: Fall Restrictions Weight Bearing Restrictions: No      Mobility  Bed Mobility                  Transfers                    Ambulation/Gait                Stairs            Wheelchair Mobility    Modified Rankin (Stroke Patients Only)       Balance Overall balance assessment: History of Falls;Needs assistance         Standing balance support: Bilateral upper extremity supported;During functional activity Standing balance-Leahy Scale: Poor Standing balance comment: requires RW during ambulation Single Leg Stance - Right Leg: 3 Single Leg Stance - Left Leg: 2           High Level Balance Comments: very slow to perform head turns and walking backwards with RW during gait however no LOB             Pertinent Vitals/Pain Pain Assessment: No/denies pain    Home Living Family/patient expects to be discharged to::  Private residence Living Arrangements: Spouse/significant other Available Help at Discharge: Family Type of Home: [place]: [place]: Environmental consultant - 4 wheels      Prior Function Level of Independence: Independent with assistive device(s)         Comments: typically does not use assistive device, states she has been using 4 wheeled walker for past week prior to admission     Hand Dominance        Extremity/Trunk Assessment               Lower Extremity Assessment: Generalized weakness          Communication   Communication: No difficulties  Cognition Arousal/Alertness: Awake/alert Behavior During Therapy: WFL for tasks assessed/performed Overall Cognitive Status: Within Functional Limits for tasks assessed                      General Comments      Exercises        Assessment/Plan    PT Assessment Patient needs continued PT services  PT Diagnosis Difficulty walking   PT Problem List Decreased balance;Decreased mobility  PT Treatment Interventions DME instruction;Gait training;Functional mobility training;Patient/family education;Neuromuscular re-education;Balance training;Therapeutic activities;Therapeutic exercise   PT Goals (Current goals can be found in the Care Plan section) Acute Rehab PT Goals PT Goal Formulation: With patient Time For Goal Achievement: 11/08/15 Potential to Achieve Goals: Good    Frequency Min 2X/week   Barriers to discharge        Co-evaluation               End of Session Equipment Utilized During Treatment: Gait belt Activity Tolerance: Patient tolerated treatment well Patient left: in bed;with call bell/phone within reach;with bed alarm set      Functional Assessment Tool Used: clinical judgement Functional Limitation: Mobility: Walking and moving around Mobility: Walking and Moving Around Current Status [id number]): At least 1 percent but less than 20 percent impaired, limited or restricted Mobility: Walking and Moving Around Goal Status [phone]): At least 1 percent but less than 20 percent impaired, limited or restricted    Time: 0902-0939 PT Time Calculation (min) (ACUTE ONLY): 37 min   Charges:   PT Evaluation $PT Eval Moderate Complexity: 1 Procedure PT Treatments $Gait Training: 8-22 mins   PT G Codes:   PT G-Codes **NOT FOR INPATIENT CLASS** Functional Assessment Tool Used: clinical judgement Functional Limitation: Mobility: Walking and moving around Mobility: Walking and Moving Around Current Status  [id number]): At least 1 percent but less than 20 percent impaired, limited or restricted Mobility: Walking and Moving Around Goal Status [phone]): At least 1 percent but less than 20 percent impaired, limited or restricted    Graison Leinberger,KATHrine E 11/01/2015, 12:35 PM Carmelia Bake, PT, DPT 11/01/2015 Pager: [phone]

## 2015-11-01 NOTE — Progress Notes (Signed)
[place] for Warfarin Indication: atrial fibrillation  Allergies  Allergen Reactions  . Alendronate Sodium Other (See Comments)    unknown  . Doxycycline Other (See Comments)    Inc LFT  . Isosorbide Other (See Comments)  . Lidocaine Nausea Only    Given at Dentist office  . Metformin And Related     Decreases appetite resulting in weight loss  . Metronidazole Nausea And Vomiting    REACTION: sick/ GI  . Nifedipine Other (See Comments)   Patient Measurements: Height: 5' (152.4 cm) Weight: 114 lb (51.71 kg) IBW/kg (Calculated) : 45.5  Vital Signs: Temp: 99 F (37.2 C) (02/13 0635) Temp Source: Oral (02/13 0635) BP: 144/72 mmHg (02/13 0635) Pulse Rate: 76 (02/13 0635)  Labs:  Recent Labs  10/30/15 2359 10/31/15 0256 11/01/15 0629  HGB 13.2  --  13.1  HCT 40.5  --  40.5  PLT 143*  --  135*  LABPROT  --  32.3* 25.1*  INR  --  3.22* 2.30*  CREATININE 0.79  --  0.85   Estimated Creatinine Clearance: 33.5 mL/min (by C-G formula based on Cr of 0.85).  Medical History: Past Medical History  Diagnosis Date  . Osteoporosis   . Hyperlipidemia   . Hypertension   . Hypercalcemia   . CAD (coronary artery disease)     a. 1989 s/p MI, TPA, PTCA;  b. 06/2014 NSTEMI/Cath: LM nl, LAD 30-40, D2 90, small, LCX 160m, OM1 90, RCA Cs2+ 70-80p, 50-78m, 90d (? ruptured plaque), EF 55-60%-->Med Rx.  Marland Kitchen [place]     past  . Diverticulosis   . Hx of adenomatous colonic polyps 2010    last colonscopy 2010,Dr Vira Agar - no need for further  colonoscopy given age  . PAF/PAFlutter     a. 06/2014->amio and coumadin started.  . Elevated LFTs     a. 06/2014 Abd U/S: no GB pathology.  . Chronic diastolic CHF (congestive heart failure) ([place])     a. 07/2014 Echo: EF 60-65%, Gr 1 DD, mild to mod TR, PASP 40mmHg.  . Myocardial infarction ([place]) 1989  . Type II diabetes mellitus ([place])   . GERD (gastroesophageal reflux disease)   . Arthritis     "hips"  (08/07/2014)   Medications:  Scheduled:  . amiodarone  200 mg Oral BID  . azithromycin  250 mg Oral Daily  . cefTRIAXone (ROCEPHIN)  IV  1 g Intravenous Q24H  . cholecalciferol  2,000 Units Oral Q1200  . docusate sodium  100 mg Oral BID  . furosemide  40 mg Oral Daily  . insulin aspart  0-15 Units Subcutaneous TID WC  . insulin aspart  0-5 Units Subcutaneous QHS  . lisinopril  20 mg Oral Daily  . metoprolol tartrate  12.5 mg Oral BID  . pantoprazole  40 mg Oral Daily  . potassium chloride  40 mEq Oral Q4H  . pravastatin  40 mg Oral Daily  . sodium chloride flush  3 mL Intravenous Q12H  . sodium chloride flush  3 mL Intravenous Q12H  . vitamin B-12  1,000 mcg Oral Daily   Assessment: 89 yoF to ED with weakness, dizzines; fall at home 3 days PTA with facial bruising. PMH includes: Afib, HTN, CAD, CHF, DM2, HPL. Chronic Warfarin for Afib: home dose 3mg  on MWF, 1.5mg  other days, last dose 2/11 at 1630. Admit INR 3.22  Today 11/01/2015 INR 2.30, regular diet ordered H/H, platelets in good range  Goal of Therapy:  INR 2-3 Monitor platelets by anticoagulation protocol: Yes   Plan:   Warfarin 3mg  today  Daily PT/INR ordered  Monitor CBC  Minda Ditto PharmD Pager [phone] 11/01/2015, 12:21 PM

## 2015-11-01 NOTE — Progress Notes (Signed)
PROGRESS NOTE  Bianca Mueller [id number] DOB: [birth year] DOA: 10/30/2015 PCP: Loura Pardon, MD  Assessment/Plan: Weakness IV abx for UTI/PNA PT consult     UTI IV Rocephin Urine Cx sent   Contusion  Ice Packs PRN   Coagulopathy- due to Coumadin Rx Monitor PT/INR daily and adjust PRN   Atrial fibrillation (HCC) Continue Amiodarone Rx On Coumadin Rx         Rate controlled    Chronic diastolic CHF (congestive heart failure) (HCC) On Lasix, Metoprolol, and Lisinopril Daily Monitor I/Os Avoid Fluid Overload   CAD (coronary artery disease) On Metoprolol, ASA, Pravastatin and Lisinopril Rx    Essential hypertension On Lasix, Metoprolol, and Lisinopril Daily Monitor BPs    DM2 (diabetes mellitus, type 2) ([place]) Hold Januvia Rx SSI coverage PRN   History of fall PT Consult- home health  Hypokalemia -replete and recheck in AM   Code Status: full Family Communication: patient/called daughter Disposition Plan: d/c once urine cx back   Consultants:    Procedures:      HPI/Subjective: Still not feeling well but did eat breakfast and work with PT   Objective: Filed Vitals:   11/01/15 0228 11/01/15 0635  BP: 136/57 144/72  Pulse: 68 76  Temp: 97.8 F (36.6 C) 99 F (37.2 C)  Resp: 19 18    Intake/Output Summary (Last 24 hours) at 11/01/15 1035 Last data filed at 11/01/15 0910  Gross per 24 hour  Intake    480 ml  Output   1275 ml  Net   -795 ml   Filed Weights   10/31/15 1204  Weight: 51.71 kg (114 lb)    Exam:   General:  Awake, NAD  Cardiovascular: rrr  Respiratory:  clear  Abdomen: +BS, soft  Musculoskeletal: no edema  Data Reviewed: Basic Metabolic Panel:  Recent Labs Lab 10/27/15 1035 10/30/15 2359 11/01/15 0629  NA 135 137 137  K 3.6 3.5 2.9*  CL 97 101 99*  CO2 27 25 28   GLUCOSE 236* 261* 115*  BUN 18 15 17   CREATININE 0.92 0.79 0.85  CALCIUM 9.3 8.6* 8.5*   Liver Function Tests:  Recent Labs Lab 10/30/15 2359  AST 73*  ALT 62*  ALKPHOS 54  BILITOT 1.0  PROT 6.9  ALBUMIN 3.6    Recent Labs Lab 10/30/15 2359  LIPASE 29   No results for input(s): AMMONIA in the last 168 hours. CBC:  Recent Labs Lab 10/27/15 1035 10/30/15 2359 11/01/15 0629  WBC 6.8 5.6 7.0  NEUTROABS 5.5  --   --   HGB 14.5 13.2 13.1  HCT 44.4 40.5 40.5  MCV 89.1 88.6 90.2  PLT 186.0 143* 135*   Cardiac Enzymes: No results for input(s): CKTOTAL, CKMB, CKMBINDEX, TROPONINI in the last 168 hours. BNP (last 3 results) No results for input(s): BNP in the last 8760 hours.  ProBNP (last 3 results) No results for input(s): PROBNP in the last 8760 hours.  CBG:  Recent Labs Lab 10/31/15 1155 10/31/15 1624 10/31/15 2153 11/01/15 0738  GLUCAP 215* 154* 112* 103*    No results found for this or any previous visit (from the past 240 hour(s)).   Studies: Dg Chest 2 View  10/31/2015  CLINICAL DATA:  82 year old female with cough EXAM: CHEST  2 VIEW COMPARISON:  Radiograph dated 10/27/2015 and CT dated 07/18/2014 FINDINGS: Two views of the chest demonstrate stable appearing left lung base densities similar to prior study which may represent atelectasis/scarring. Areas of linear and reticular  densities at the right lung base appear similar or slightly more prominent compared to prior study and also may represent atelectasis/scarring. Developing pneumonia is not excluded. There is no focal consolidation, pleural effusion, or pneumothorax. Stable cardiac silhouette. No acute osseous pathology. IMPRESSION: Bibasilar atelectasis/ scarring. Pneumonia is  not excluded. Clinical correlation is recommended. Electronically Signed   By: Anner Crete M.D.   On: 10/31/2015 03:16   Ct Head Wo Contrast  10/31/2015  CLINICAL DATA:  82 year old female with fall EXAM: CT HEAD WITHOUT CONTRAST CT MAXILLOFACIAL WITHOUT CONTRAST TECHNIQUE: Multidetector CT imaging of the head and maxillofacial structures were performed using the standard protocol without intravenous contrast. Multiplanar CT image reconstructions of the maxillofacial structures were also generated. COMPARISON:  CT dated 02/31/2016 FINDINGS: CT HEAD FINDINGS There is prominence of the ventricles and sulci compatible with age-related atrophy. Periventricular and deep white matter chronic microvascular ischemic changes noted. There is no acute intracranial hemorrhage. No mass effect or midline shift. There is mucoperiosteal thickening and partial opacification of the visualized paranasal sinuses. The mastoid air cells are clear. The calvarium is intact. CT MAXILLOFACIAL FINDINGS There is no acute facial bone fractures. The maxilla, mandible, and pterygoid plates are intact. The globes, retro-orbital fat, and orbital walls are preserved. There is partial opacification of the maxillary sinuses bilaterally. The soft tissues are unremarkable. IMPRESSION: No acute intracranial hemorrhage. No acute/ traumatic ischial bone fractures. Electronically Signed   By: Anner Crete M.D.   On: 10/31/2015 02:59   Ct Maxillofacial Wo Cm  10/31/2015  CLINICAL DATA:  82 year old female with fall EXAM: CT HEAD WITHOUT CONTRAST CT MAXILLOFACIAL WITHOUT CONTRAST TECHNIQUE: Multidetector CT imaging of the head and maxillofacial structures were performed using the standard protocol without intravenous contrast. Multiplanar CT image reconstructions of the maxillofacial structures were also generated. COMPARISON:  CT dated 02/31/2016 FINDINGS: CT HEAD FINDINGS There is prominence of the ventricles and sulci compatible with  age-related atrophy. Periventricular and deep white matter chronic microvascular ischemic changes noted. There is no acute intracranial hemorrhage. No mass effect or midline shift. There is mucoperiosteal thickening and partial opacification of the visualized paranasal sinuses. The mastoid air cells are clear. The calvarium is intact. CT MAXILLOFACIAL FINDINGS There is no acute facial bone fractures. The maxilla, mandible, and pterygoid plates are intact. The globes, retro-orbital fat, and orbital walls are preserved. There is partial opacification of the maxillary sinuses bilaterally. The soft tissues are unremarkable. IMPRESSION: No acute intracranial hemorrhage. No acute/ traumatic ischial bone fractures. Electronically Signed   By: Anner Crete M.D.   On: 10/31/2015 02:59    Scheduled Meds: . amiodarone  200 mg Oral BID  . azithromycin  250 mg Oral Daily  . cefTRIAXone (ROCEPHIN)  IV  1 g Intravenous Q24H  . cholecalciferol  2,000 Units Oral Q1200  . docusate sodium  100 mg Oral BID  . furosemide  40 mg Oral Daily  . insulin aspart  0-15 Units Subcutaneous TID WC  . insulin aspart  0-5 Units Subcutaneous QHS  . lisinopril  20 mg Oral Daily  . metoprolol tartrate  12.5 mg Oral BID  . pantoprazole  40 mg Oral Daily  . potassium chloride  40 mEq Oral Q4H  . pravastatin  40 mg Oral Daily  . sodium chloride flush  3 mL Intravenous Q12H  . sodium chloride flush  3 mL Intravenous Q12H  . vitamin B-12  1,000 mcg Oral Daily   Continuous Infusions:  Antibiotics Given (last 72 hours)  Date/Time Action Medication Dose Rate   10/31/15 1158 Given   azithromycin (ZITHROMAX) tablet 500 mg 500 mg    11/01/15 0300 Given   cefTRIAXone (ROCEPHIN) 1 g in dextrose 5 % 50 mL IVPB 1 g 100 mL/hr   11/01/15 1031 Given   azithromycin (ZITHROMAX) tablet 250 mg 250 mg       Principal Problem:   Weakness Active Problems:   Essential hypertension   DM2 (diabetes mellitus, type 2) (HCC)   Atrial  fibrillation (HCC)   Chronic diastolic CHF (congestive heart failure) ([place])   CAD (coronary artery disease)   Type II diabetes mellitus ([place])   History of fall    Time spent: 25 min    [facility] Pager [phone]. If 7PM-7AM, please contact night-coverage at [email or web address], password [facility] 11/01/2015, 10:35 AM

## 2015-11-02 DIAGNOSIS — I5032 Chronic diastolic (congestive) heart failure: Secondary | ICD-10-CM | POA: Diagnosis not present

## 2015-11-02 DIAGNOSIS — J189 Pneumonia, unspecified organism: Secondary | ICD-10-CM | POA: Diagnosis not present

## 2015-11-02 DIAGNOSIS — E1159 Type 2 diabetes mellitus with other circulatory complications: Secondary | ICD-10-CM | POA: Diagnosis not present

## 2015-11-02 DIAGNOSIS — R531 Weakness: Secondary | ICD-10-CM | POA: Diagnosis not present

## 2015-11-02 DIAGNOSIS — I481 Persistent atrial fibrillation: Secondary | ICD-10-CM | POA: Diagnosis not present

## 2015-11-02 DIAGNOSIS — I10 Essential (primary) hypertension: Secondary | ICD-10-CM | POA: Diagnosis not present

## 2015-11-02 LAB — BASIC METABOLIC PANEL
ANION GAP: 9 (ref 5–15)
BUN: 14 mg/dL (ref 6–20)
CALCIUM: 9 mg/dL (ref 8.9–10.3)
CO2: 26 mmol/L (ref 22–32)
Chloride: 104 mmol/L (ref 101–111)
Creatinine, Ser: 0.87 mg/dL (ref 0.44–1.00)
GFR calc Af Amer: >60 mL/min (ref >60)
GFR, EST NON AFRICAN AMERICAN: 58 mL/min — AB (ref >60)
GLUCOSE: 142 mg/dL — AB (ref 65–99)
Potassium: 5.3 mmol/L — ABNORMAL HIGH (ref 3.5–5.1)
Sodium: 139 mmol/L (ref 135–145)

## 2015-11-02 LAB — GLUCOSE, CAPILLARY
GLUCOSE-CAPILLARY: 227 mg/dL — AB (ref 65–99)
Glucose-Capillary: 125 mg/dL — ABNORMAL HIGH (ref 65–99)

## 2015-11-02 LAB — MAGNESIUM: Magnesium: 1.9 mg/dL (ref 1.7–2.4)

## 2015-11-02 LAB — PROTIME-INR
INR: 2.87 — AB (ref 0.00–1.49)
PROTHROMBIN TIME: 29.6 s — AB (ref 11.6–15.2)

## 2015-11-02 MED ORDER — WARFARIN 0.5 MG HALF TABLET
1.5000 mg | ORAL_TABLET | Freq: Once | ORAL | Status: DC
  Filled 2015-11-02: qty 1

## 2015-11-02 MED ORDER — AMIODARONE HCL 200 MG PO TABS: 200.0000 mg | ORAL_TABLET | Freq: Every day | ORAL | Status: DC

## 2015-11-02 MED ORDER — AMOXICILLIN-POT CLAVULANATE 875-125 MG PO TABS: 1.0000 | ORAL_TABLET | Freq: Two times a day (BID) | ORAL | Status: DC

## 2015-11-02 MED ORDER — FUROSEMIDE 40 MG PO TABS: 40.0000 mg | ORAL_TABLET | Freq: Every day | ORAL | Status: DC

## 2015-11-02 MED ORDER — WARFARIN SODIUM 3 MG PO TABS: 1.5000 mg | ORAL_TABLET | Freq: Every day | ORAL | Status: DC

## 2015-11-02 MED ORDER — AMOXICILLIN-POT CLAVULANATE 875-125 MG PO TABS
1.0000 | ORAL_TABLET | Freq: Two times a day (BID) | ORAL | Status: DC
  Administered 2015-11-02: 1 via ORAL
  Filled 2015-11-02 (×2): qty 1

## 2015-11-02 NOTE — Progress Notes (Signed)
Date: November 02, 2015 Chart reviewed for concurrent status and case management needs. Will continue to follow patient for changes and needs: advanced hhc per patient request notified of pt home needs/ Velva Harman, BSN, [place], [place]   [phone]

## 2015-11-02 NOTE — Progress Notes (Signed)
Pt discharged home. Discharge instructions given. Pt verbalized understanding. VSS. IV d/c with cath intact.  Pt escorted to main entrance via WC to awaiting private vehicle.

## 2015-11-02 NOTE — Progress Notes (Signed)
[facility] Indication: atrial fibrillation  Allergies  Allergen Reactions  . Alendronate Sodium Other (See Comments)    unknown  . Doxycycline Other (See Comments)    Inc LFT  . Isosorbide Other (See Comments)  . Lidocaine Nausea Only    Given at Dentist office  . Metformin And Related     Decreases appetite resulting in weight loss  . Metronidazole Nausea And Vomiting    REACTION: sick/ GI  . Nifedipine Other (See Comments)   Patient Measurements: Height: 5' (152.4 cm) Weight: 114 lb (51.71 kg) IBW/kg (Calculated) : 45.5  Vital Signs: Temp: 97.3 F (36.3 C) (02/14 0611) Temp Source: Oral (02/14 0611) BP: 156/65 mmHg (02/14 0611) Pulse Rate: 65 (02/14 0611)  Labs:  Recent Labs  10/30/15 2359 10/31/15 0256 11/01/15 0629 11/02/15 0558  HGB 13.2  --  13.1  --   HCT 40.5  --  40.5  --   PLT 143*  --  135*  --   LABPROT  --  32.3* 25.1* 29.6*  INR  --  3.22* 2.30* 2.87*  CREATININE 0.79  --  0.85 0.87   Estimated Creatinine Clearance: 32.7 mL/min (by C-G formula based on Cr of 0.87).  Medical History: Past Medical History  Diagnosis Date  . Osteoporosis   . Hyperlipidemia   . Hypertension   . Hypercalcemia   . CAD (coronary artery disease)     a. 1989 s/p MI, TPA, PTCA;  b. 06/2014 NSTEMI/Cath: LM nl, LAD 30-40, D2 90, small, LCX 151m, OM1 90, RCA Cs2+ 70-80p, 50-74m, 90d (? ruptured plaque), EF 55-60%-->Med Rx.  Marland Kitchen Zoster     past  . Diverticulosis   . Hx of adenomatous colonic polyps 2010    last colonscopy 2010,Dr Vira Agar - no need for further  colonoscopy given age  . PAF/PAFlutter     a. 06/2014->amio and coumadin started.  . Elevated LFTs     a. 06/2014 Abd U/S: no GB pathology.  . Chronic diastolic CHF (congestive heart failure) ([place])     a. 07/2014 Echo: EF 60-65%, Gr 1 DD, mild to mod TR, PASP 73mmHg.  . Myocardial infarction ([place]) 1989  . Type II diabetes mellitus ([place])   . GERD  (gastroesophageal reflux disease)   . Arthritis     "hips" (08/07/2014)   Medications:  Scheduled:  . amiodarone  200 mg Oral BID  . azithromycin  250 mg Oral Daily  . cefTRIAXone (ROCEPHIN)  IV  1 g Intravenous Q24H  . cholecalciferol  2,000 Units Oral Q1200  . docusate sodium  100 mg Oral BID  . furosemide  40 mg Oral Daily  . insulin aspart  0-15 Units Subcutaneous TID WC  . insulin aspart  0-5 Units Subcutaneous QHS  . lisinopril  20 mg Oral Daily  . metoprolol tartrate  12.5 mg Oral BID  . pantoprazole  40 mg Oral Daily  . pravastatin  40 mg Oral Daily  . sodium chloride flush  3 mL Intravenous Q12H  . sodium chloride flush  3 mL Intravenous Q12H  . vitamin B-12  1,000 mcg Oral Daily  . warfarin  3 mg Oral ONCE-1800  . Warfarin - Pharmacist Dosing Inpatient   Does not apply q1800   Assessment: 26 yoF to ED with weakness, dizzines; fall at home 3 days PTA with facial bruising. PMH includes: Afib, HTN, CAD, CHF, DM2, HPL. Chronic Warfarin for Afib: home dose 3mg  on MWF, 1.5mg  other days,  last dose 2/11 at 1630. Admit INR 3.22 DDI: Azithromycin may increase INR  Today 11/02/2015 INR 2.87, regular diet ordered H/H, platelets in good range yesterday  Goal of Therapy:  INR 2-3 Monitor platelets by anticoagulation protocol: Yes   Plan:   Warfarin 1.5mg  today  Daily PT/INR ordered  Monitor CBC  Minda Ditto PharmD Pager [phone] 11/02/2015, 7:35 AM

## 2015-11-02 NOTE — Discharge Summary (Signed)
Physician Discharge Summary  Bianca Mueller XBJ:[id number] DOB: [birth year] DOA: 10/30/2015  PCP: Loura Pardon, MD  Admit date: 10/30/2015 Discharge date: 11/02/2015  Time spent: 35 minutes  Recommendations for Outpatient Follow-up:  1. Home health PT 2. INR check in 1 week    Discharge Diagnoses:  Principal Problem:   Weakness Active Problems:   Essential hypertension   DM2 (diabetes mellitus, type 2) (HCC)   Atrial fibrillation (HCC)   Chronic diastolic CHF (congestive heart failure) (HCC)   CAD (coronary artery disease)   Type II diabetes mellitus ([place])   History of fall   Discharge Condition: improved  Diet recommendation: cardiac/diabetic  Filed Weights   10/31/15 1204  Weight: 51.71 kg (114 lb)    History of present illness:  Bianca Mueller is a 82 y.o. female with a history of CAD, Atrial Fibrillation on Coumadin Rx, HTN, Diastolic CHF, DM2 and Hyperlipidemia who presents to the ED with complaints of increased weakness and dizziness and feeling as if she would fall. She suffered a fall 3 days ago which caused facial bruising. Of Note: she did see her PCP the same day of her fall and was evaluated. She reports having nausea and vomiting and productive coughing this past week. She denies having fevers and chills and dysuria. Her labs in the ED revealed that she has a UTI, and a Urine culture was sent and she was placed on IV Rocephin. A CT scan of the Head and Maxillofacial areas were performed as well and were negative for acute findings. She was referred for admission.   Hospital Course:  Weakness urine culture was negative  Suspect due to PNA based on physical exam and x ray along with cough  PNA -augmentin treatment as will not prolong Qtc or interfere with coumadin  Contusion  Ice Packs PRN   Coagulopathy- due to Coumadin Rx Monitor PT/INR  adjust PRN   Atrial fibrillation  (HCC) Continue Amiodarone Rx On Coumadin Rx  Rate controlled   Chronic diastolic CHF (congestive heart failure) (HCC) On Lasix, Metoprolol, and Lisinopril Daily   CAD (coronary artery disease) On Metoprolol, ASA, Pravastatin and Lisinopril Rx   Essential hypertension On Lasix, Metoprolol, and Lisinopril Daily    DM2 (diabetes mellitus, type 2) ([place]) resume home meds   History of fall PT Consult- home health -PCP and cardiology will need to discuss risks and benefits coumadin if continues to have falls   Hypokalemia -repleted  Procedures:    Consultations:    Discharge Exam: Filed Vitals:   11/02/15 0210 11/02/15 0611  BP: 155/60 156/65  Pulse: 57 65  Temp: 98.1 F (36.7 C) 97.3 F (36.3 C)  Resp: 18 18    General: awake-- feeling better- eating well Cardiovascular: rrr Respiratory: right middle lobe rales  Discharge Instructions   Discharge Instructions    Diet - low sodium heart healthy    Complete by:  As directed      Diet Carb Modified    Complete by:  As directed      Discharge instructions    Complete by:  As directed   Home health- intermittent supervision INR per cardiology     Increase activity slowly    Complete by:  As directed           Current Discharge Medication List    START taking these medications   Details  amoxicillin-clavulanate (AUGMENTIN) 875-125 MG tablet Take 1 tablet by mouth every 12 (twelve) hours. Qty: 10 tablet, Refills: 0  CONTINUE these medications which have CHANGED   Details  amiodarone (PACERONE) 200 MG tablet Take 1 tablet (200 mg total) by mouth daily. Qty: 180 tablet, Refills: 3    furosemide (LASIX) 40 MG tablet Take 1 tablet (40 mg total) by mouth daily. As directed by cardiologist,  Pt may take additional doses as needed Qty: 180 tablet, Refills: 2    warfarin (COUMADIN) 3 MG tablet Take 0.5-1 tablets (1.5-3 mg total) by mouth daily. Take as directed by anti-coagulation clinic TAKE ONE-HALF OF A TABLET (1.5 MG) ON SUNDAYS,TUESDAYS,THURSDAYS AND SATURDAYS. TAKE 1 TABLET (3 MG) ON ALL OTHER DAYS Qty: 70 tablet, Refills: 0      CONTINUE these medications which have NOT CHANGED   Details  ACCU-CHEK FASTCLIX LANCETS MISC Use as instructed to test blood sugar once daily or as needed.  Diagnosis:  E11.9   Non insulin-dependent. Qty: 102 each, Refills: 4    Blood Glucose Monitoring Suppl (ACCU-CHEK AVIVA PLUS) W/DEVICE KIT Use as instructed to test blood sugar once daily or as needed.  Diagnosis:  E11.9  Non insulin-dependent. Qty: 1 kit, Refills: 0    cholecalciferol (VITAMIN D) 1000 UNITS tablet Take 2,000 Units by mouth daily at 12 noon.     cloNIDine (CATAPRES - DOSED IN MG/24 HR) 0.2 mg/24hr patch Place 0.2 mg onto the skin once a week. ON WEDNESDAYS    docusate sodium 100 MG CAPS Take 100 mg by mouth 2 (two) times daily. Qty: 10 capsule, Refills: 0    esomeprazole (NEXIUM) 40 MG capsule Take 1 capsule (40 mg total) by mouth daily at 12 noon. Qty: 90 capsule, Refills: 2    !! glucose blood test strip Use as instructed to test blood sugar once daily or as needed.  Diagnosis:  E11.9   Non insulin-dependent.    !! glucose blood test strip Use as instructed to check blood sugar once daily or as needed.  Diagnosis:  E11.9   Non insulin-dependent. Qty: 100 each, Refills: 4    lisinopril (PRINIVIL,ZESTRIL) 20 MG tablet TAKE 1 TABLET EVERY DAY Qty: 90 tablet, Refills: 2    metoprolol tartrate (LOPRESSOR) 25 MG tablet Take 0.5 tablets (12.5 mg total) by mouth 2 (two) times daily. Qty: 180 tablet, Refills: 3    nitroGLYCERIN (NITROSTAT) 0.4 MG SL tablet Place 1 tablet (0.4 mg total) under the tongue every 5 (five) minutes as needed. For chest pain and if not improved  seek medical attention in ER. Qty: 15 tablet, Refills: 0    pravastatin (PRAVACHOL) 40 MG tablet Take 1 tablet (40 mg total) by mouth daily. Qty: 90 tablet, Refills: 3    sitaGLIPtin (JANUVIA) 100 MG tablet Take 1 tablet (100 mg total) by mouth daily. Qty: 90 tablet, Refills: 2    vitamin B-12 (CYANOCOBALAMIN) 1000 MCG tablet Take 1,000 mcg by mouth daily.     !! - Potential duplicate medications found. Please discuss with provider.    STOP taking these medications     cloNIDine (CATAPRES) 0.1 MG tablet        Allergies  Allergen Reactions  . Alendronate Sodium Other (See Comments)    unknown  . Doxycycline Other (See Comments)    Inc LFT  . Isosorbide Other (See Comments)  . Lidocaine Nausea Only    Given at Dentist office  . Metformin And Related     Decreases appetite resulting in weight loss  . Metronidazole Nausea And Vomiting    REACTION: sick/ GI  . Nifedipine Other (  See Comments)      The results of significant diagnostics from this hospitalization (including imaging, microbiology, ancillary and laboratory) are listed below for reference.    Significant Diagnostic Studies: Dg Chest 2 View  10/31/2015  CLINICAL DATA:  82 year old female with cough EXAM: CHEST  2 VIEW COMPARISON:  Radiograph dated 10/27/2015 and CT dated 07/18/2014 FINDINGS: Two views of the chest demonstrate stable appearing left lung base densities similar to prior study which may represent atelectasis/scarring. Areas of linear and reticular densities at the right lung base appear similar or slightly more prominent compared to prior study and also may represent atelectasis/scarring. Developing pneumonia is not excluded. There is no focal consolidation, pleural effusion, or pneumothorax. Stable cardiac silhouette. No acute osseous pathology. IMPRESSION: Bibasilar atelectasis/ scarring. Pneumonia is not excluded. Clinical correlation is recommended. Electronically Signed   By: Anner Crete M.D.    On: 10/31/2015 03:16   Dg Chest 2 View  10/27/2015  CLINICAL DATA:  One week of cough, weakness ; patient fell this morning striking his space on a chair, history of previous smoking, coronary artery disease and CHF. EXAM: CHEST  2 VIEW COMPARISON:  PA and lateral chest x-ray of February 26, 2015 FINDINGS: The lungs are mildly hyperinflated with hemidiaphragm flattening. There is subtle increased density at the left lung base which is not entirely new but is more conspicuous than on the previous study. The cardiac silhouette remains mildly enlarged. The pulmonary vascularity is normal. There is tortuosity of the ascending and descending thoracic aorta with mural calcification. The mediastinum is normal in width. There is no pleural effusion. The bony thorax exhibits no acute abnormality. IMPRESSION: COPD with minimal left basilar subsegmental atelectasis and scarring. There is no alveolar pneumonia. Mild chronic enlargement of the cardiac silhouette without evidence of pulmonary vascular congestion or pulmonary edema. The observed portions of the bony thorax exhibit no acute abnormalities. Electronically Signed   By: David  Martinique M.D.   On: 10/27/2015 11:20   Ct Head Wo Contrast  10/31/2015  CLINICAL DATA:  82 year old female with fall EXAM: CT HEAD WITHOUT CONTRAST CT MAXILLOFACIAL WITHOUT CONTRAST TECHNIQUE: Multidetector CT imaging of the head and maxillofacial structures were performed using the standard protocol without intravenous contrast. Multiplanar CT image reconstructions of the maxillofacial structures were also generated. COMPARISON:  CT dated 02/31/2016 FINDINGS: CT HEAD FINDINGS There is prominence of the ventricles and sulci compatible with age-related atrophy. Periventricular and deep white matter chronic microvascular ischemic changes noted. There is no acute intracranial hemorrhage. No mass effect or midline shift. There is mucoperiosteal thickening and partial opacification of the visualized  paranasal sinuses. The mastoid air cells are clear. The calvarium is intact. CT MAXILLOFACIAL FINDINGS There is no acute facial bone fractures. The maxilla, mandible, and pterygoid plates are intact. The globes, retro-orbital fat, and orbital walls are preserved. There is partial opacification of the maxillary sinuses bilaterally. The soft tissues are unremarkable. IMPRESSION: No acute intracranial hemorrhage. No acute/ traumatic ischial bone fractures. Electronically Signed   By: Anner Crete M.D.   On: 10/31/2015 02:59   Ct Maxillofacial Wo Cm  10/31/2015  CLINICAL DATA:  82 year old female with fall EXAM: CT HEAD WITHOUT CONTRAST CT MAXILLOFACIAL WITHOUT CONTRAST TECHNIQUE: Multidetector CT imaging of the head and maxillofacial structures were performed using the standard protocol without intravenous contrast. Multiplanar CT image reconstructions of the maxillofacial structures were also generated. COMPARISON:  CT dated 02/31/2016 FINDINGS: CT HEAD FINDINGS There is prominence of the ventricles and sulci compatible with  age-related atrophy. Periventricular and deep white matter chronic microvascular ischemic changes noted. There is no acute intracranial hemorrhage. No mass effect or midline shift. There is mucoperiosteal thickening and partial opacification of the visualized paranasal sinuses. The mastoid air cells are clear. The calvarium is intact. CT MAXILLOFACIAL FINDINGS There is no acute facial bone fractures. The maxilla, mandible, and pterygoid plates are intact. The globes, retro-orbital fat, and orbital walls are preserved. There is partial opacification of the maxillary sinuses bilaterally. The soft tissues are unremarkable. IMPRESSION: No acute intracranial hemorrhage. No acute/ traumatic ischial bone fractures. Electronically Signed   By: Anner Crete M.D.   On: 10/31/2015 02:59    Microbiology: Recent Results (from the past 240 hour(s))  Urine culture     Status: None   Collection  Time: 10/31/15  1:10 AM  Result Value Ref Range Status   Specimen Description URINE, RANDOM  Final   Special Requests Normal  Final   Culture   Final    MULTIPLE SPECIES PRESENT, SUGGEST RECOLLECTION Performed at [facility]    Report Status 11/01/2015 FINAL  Final     Labs: Basic Metabolic Panel:  Recent Labs Lab 10/27/15 1035 10/30/15 2359 11/01/15 0629 11/02/15 0558  NA 135 137 137 139  K 3.6 3.5 2.9* 5.3*  CL 97 101 99* 104  CO2 27 25 28 26   GLUCOSE 236* 261* 115* 142*  BUN 18 15 17 14   CREATININE 0.92 0.79 0.85 0.87  CALCIUM 9.3 8.6* 8.5* 9.0  MG  --   --   --  1.9   Liver Function Tests:  Recent Labs Lab 10/30/15 2359  AST 73*  ALT 62*  ALKPHOS 54  BILITOT 1.0  PROT 6.9  ALBUMIN 3.6    Recent Labs Lab 10/30/15 2359  LIPASE 29   No results for input(s): AMMONIA in the last 168 hours. CBC:  Recent Labs Lab 10/27/15 1035 10/30/15 2359 11/01/15 0629  WBC 6.8 5.6 7.0  NEUTROABS 5.5  --   --   HGB 14.5 13.2 13.1  HCT 44.4 40.5 40.5  MCV 89.1 88.6 90.2  PLT 186.0 143* 135*   Cardiac Enzymes: No results for input(s): CKTOTAL, CKMB, CKMBINDEX, TROPONINI in the last 168 hours. BNP: BNP (last 3 results) No results for input(s): BNP in the last 8760 hours.  ProBNP (last 3 results) No results for input(s): PROBNP in the last 8760 hours.  CBG:  Recent Labs Lab 11/01/15 1141 11/01/15 1645 11/01/15 2032 11/02/15 0733 11/02/15 1133  GLUCAP 210* 140* 156* 125* 227*       Signed:  JESSICA U VANN  DO.  [facility] 11/02/2015, 12:06 PM

## 2015-11-03 ENCOUNTER — Telehealth: Payer: Self-pay

## 2015-11-03 NOTE — Telephone Encounter (Signed)
Admission: 10/30/15-11/02/15  Discharge Diagnoses:  Principal Problem:  Weakness Active Problems:  Essential hypertension  DM2 (diabetes mellitus, type 2) (HCC)  Atrial fibrillation (HCC)  Chronic diastolic CHF (congestive heart failure) ([place])  CAD (coronary artery disease)  Type II diabetes mellitus ([place])  History of fall  Transition Care Management Follow-up Telephone Call - Carlisle Cater   Date discharged? 11/02/2015   How have you been since you were released from the hospital? Health status improving   Do you understand why you were in the hospital? Yes   Do you understand the discharge instructions? Yes   Where were you discharged to? Home   Items Reviewed:  Medications reviewed: No, daughter manages medications  Allergies reviewed: Yes, but patient was unable to confirm accuracy  Dietary changes reviewed: Yes, low sodium heart healthy  Referrals reviewed: Yes   Functional Questionnaire:   Activities of Daily Living (ADLs):   He states they are independent in the following: independent with ADLs  States they require assistance with the following: ambulates with walker for safety purposes   Any transportation issues/concerns?: NO, husband drives    Any patient concerns? NO   Confirmed importance and date/time of follow-up visits scheduled YES  Provider Appointment booked with Dr. Glori Bickers on 11/08/15 @ 2:00PM  Confirmed with patient if condition begins to worsen call PCP or go to the ER.  Patient was given the office number and encouraged to call back with question or concerns.  : YES

## 2015-11-04 ENCOUNTER — Telehealth: Payer: Self-pay | Admitting: *Deleted

## 2015-11-04 DIAGNOSIS — I5032 Chronic diastolic (congestive) heart failure: Secondary | ICD-10-CM | POA: Diagnosis not present

## 2015-11-04 DIAGNOSIS — J189 Pneumonia, unspecified organism: Secondary | ICD-10-CM | POA: Diagnosis not present

## 2015-11-04 DIAGNOSIS — I4891 Unspecified atrial fibrillation: Secondary | ICD-10-CM | POA: Diagnosis not present

## 2015-11-04 DIAGNOSIS — E119 Type 2 diabetes mellitus without complications: Secondary | ICD-10-CM | POA: Diagnosis not present

## 2015-11-04 DIAGNOSIS — I110 Hypertensive heart disease with heart failure: Secondary | ICD-10-CM | POA: Diagnosis not present

## 2015-11-04 DIAGNOSIS — I2510 Atherosclerotic heart disease of native coronary artery without angina pectoris: Secondary | ICD-10-CM | POA: Diagnosis not present

## 2015-11-04 DIAGNOSIS — Z9181 History of falling: Secondary | ICD-10-CM | POA: Diagnosis not present

## 2015-11-04 DIAGNOSIS — K5790 Diverticulosis of intestine, part unspecified, without perforation or abscess without bleeding: Secondary | ICD-10-CM | POA: Diagnosis not present

## 2015-11-04 DIAGNOSIS — I252 Old myocardial infarction: Secondary | ICD-10-CM | POA: Diagnosis not present

## 2015-11-05 ENCOUNTER — Encounter: Payer: Self-pay | Admitting: Family Medicine

## 2015-11-08 ENCOUNTER — Encounter: Payer: Self-pay | Admitting: *Deleted

## 2015-11-08 ENCOUNTER — Ambulatory Visit (INDEPENDENT_AMBULATORY_CARE_PROVIDER_SITE_OTHER): Payer: Commercial Managed Care - HMO | Admitting: *Deleted

## 2015-11-08 ENCOUNTER — Ambulatory Visit: Payer: Commercial Managed Care - HMO | Admitting: Family Medicine

## 2015-11-08 ENCOUNTER — Encounter: Payer: Self-pay | Admitting: Family Medicine

## 2015-11-08 ENCOUNTER — Other Ambulatory Visit: Payer: Commercial Managed Care - HMO

## 2015-11-08 VITALS — BP 160/88 | HR 63 | Temp 98.0°F | Ht 61.0 in | Wt 112.0 lb

## 2015-11-08 DIAGNOSIS — R7401 Elevation of levels of liver transaminase levels: Secondary | ICD-10-CM

## 2015-11-08 DIAGNOSIS — R740 Nonspecific elevation of levels of transaminase and lactic acid dehydrogenase [LDH]: Secondary | ICD-10-CM | POA: Diagnosis not present

## 2015-11-08 DIAGNOSIS — I10 Essential (primary) hypertension: Secondary | ICD-10-CM

## 2015-11-08 DIAGNOSIS — I4892 Unspecified atrial flutter: Secondary | ICD-10-CM

## 2015-11-08 DIAGNOSIS — Z5189 Encounter for other specified aftercare: Secondary | ICD-10-CM

## 2015-11-08 DIAGNOSIS — I481 Persistent atrial fibrillation: Secondary | ICD-10-CM

## 2015-11-08 DIAGNOSIS — I4819 Other persistent atrial fibrillation: Secondary | ICD-10-CM

## 2015-11-08 DIAGNOSIS — E876 Hypokalemia: Secondary | ICD-10-CM | POA: Insufficient documentation

## 2015-11-08 DIAGNOSIS — J189 Pneumonia, unspecified organism: Secondary | ICD-10-CM

## 2015-11-08 DIAGNOSIS — Z5181 Encounter for therapeutic drug level monitoring: Secondary | ICD-10-CM

## 2015-11-08 DIAGNOSIS — F419 Anxiety disorder, unspecified: Secondary | ICD-10-CM | POA: Insufficient documentation

## 2015-11-08 DIAGNOSIS — I4891 Unspecified atrial fibrillation: Secondary | ICD-10-CM

## 2015-11-08 LAB — POCT INR: INR: 2.6

## 2015-11-08 MED ORDER — BUSPIRONE HCL 15 MG PO TABS: 7.5000 mg | ORAL_TABLET | Freq: Two times a day (BID) | ORAL | Status: DC

## 2015-11-08 NOTE — Patient Instructions (Signed)
You are done with your antibiotic (augmentin)-if your cough worsens or does not continue to improve-please let me know  Do your physical therapy when they come out Try to increase your fluid intake  Lab today for INR and chemistries and potassium I will contact Dr Rockey Situ to discuss other safe options for your blood pressure  Try buspar 1/2 pill twice daily for anxiety -if this does not help or makes you worse -stop it and let me know

## 2015-11-08 NOTE — Assessment & Plan Note (Signed)
bp continues to be elevated despite catapress patch and additional clonidine po  BP: (!) 160/88 mmHg    Will make her cardiologist aware and ask for suggestion  No doubt- some anxiety effect -but not the whole reason  bp is very labile in pt with complex cardiac hx

## 2015-11-08 NOTE — Progress Notes (Signed)
Pre visit review using our clinic review tool, if applicable. No additional management support is needed unless otherwise documented below in the visit note. 

## 2015-11-08 NOTE — Progress Notes (Signed)
Subjective:    Patient ID: Bianca Mueller, female    DOB: Feb 22, 1926, 82 y.o.   MRN: [id number]  HPI Here for f/u of hospitalization 2/11-2/14 for weakness Initially thought due to uti but cx was neg (tx with rocephin) Then pneumonia - L atelectasis- cannot r/o infiltrate-and she had a cough   Had been dizzy the week before and fell and hit her face   Dg Chest 2 View  10/31/2015  CLINICAL DATA:  82 year old female with cough EXAM: CHEST  2 VIEW COMPARISON:  Radiograph dated 10/27/2015 and CT dated 07/18/2014 FINDINGS: Two views of the chest demonstrate stable appearing left lung base densities similar to prior study which may represent atelectasis/scarring. Areas of linear and reticular densities at the right lung base appear similar or slightly more prominent compared to prior study and also may represent atelectasis/scarring. Developing pneumonia is not excluded. There is no focal consolidation, pleural effusion, or pneumothorax. Stable cardiac silhouette. No acute osseous pathology. IMPRESSION: Bibasilar atelectasis/ scarring. Pneumonia is not excluded. Clinical correlation is recommended. Electronically Signed   By: Anner Crete M.D.   On: 10/31/2015 03:16   Ct Head Wo Contrast  10/31/2015  CLINICAL DATA:  82 year old female with fall EXAM: CT HEAD WITHOUT CONTRAST CT MAXILLOFACIAL WITHOUT CONTRAST TECHNIQUE: Multidetector CT imaging of the head and maxillofacial structures were performed using the standard protocol without intravenous contrast. Multiplanar CT image reconstructions of the maxillofacial structures were also generated. COMPARISON:  CT dated 02/31/2016 FINDINGS: CT HEAD FINDINGS There is prominence of the ventricles and sulci compatible with age-related atrophy. Periventricular and deep white matter chronic microvascular ischemic changes noted. There is no acute intracranial hemorrhage. No mass effect or midline shift. There is mucoperiosteal thickening and partial  opacification of the visualized paranasal sinuses. The mastoid air cells are clear. The calvarium is intact. CT MAXILLOFACIAL FINDINGS There is no acute facial bone fractures. The maxilla, mandible, and pterygoid plates are intact. The globes, retro-orbital fat, and orbital walls are preserved. There is partial opacification of the maxillary sinuses bilaterally. The soft tissues are unremarkable. IMPRESSION: No acute intracranial hemorrhage. No acute/ traumatic ischial bone fractures. Electronically Signed   By: Anner Crete M.D.   On: 10/31/2015 02:59   Ct Maxillofacial Wo Cm  10/31/2015  CLINICAL DATA:  82 year old female with fall EXAM: CT HEAD WITHOUT CONTRAST CT MAXILLOFACIAL WITHOUT CONTRAST TECHNIQUE: Multidetector CT imaging of the head and maxillofacial structures were performed using the standard protocol without intravenous contrast. Multiplanar CT image reconstructions of the maxillofacial structures were also generated. COMPARISON:  CT dated 02/31/2016 FINDINGS: CT HEAD FINDINGS There is prominence of the ventricles and sulci compatible with age-related atrophy. Periventricular and deep white matter chronic microvascular ischemic changes noted. There is no acute intracranial hemorrhage. No mass effect or midline shift. There is mucoperiosteal thickening and partial opacification of the visualized paranasal sinuses. The mastoid air cells are clear. The calvarium is intact. CT MAXILLOFACIAL FINDINGS There is no acute facial bone fractures. The maxilla, mandible, and pterygoid plates are intact. The globes, retro-orbital fat, and orbital walls are preserved. There is partial opacification of the maxillary sinuses bilaterally. The soft tissues are unremarkable. IMPRESSION: No acute intracranial hemorrhage. No acute/ traumatic ischial bone fractures. Electronically Signed   By: Anner Crete M.D.   On: 10/31/2015 02:59      Chemistry      Component Value Date/Time   NA 139 11/02/2015 0558    K 5.3* 11/02/2015 0558   CL 104 11/02/2015 0558  CO2 26 11/02/2015 0558   BUN 14 11/02/2015 0558   CREATININE 0.87 11/02/2015 0558      Component Value Date/Time   CALCIUM 9.0 11/02/2015 0558   ALKPHOS 54 10/30/2015 2359   AST 73* 10/30/2015 2359   ALT 62* 10/30/2015 2359   BILITOT 1.0 10/30/2015 2359     Was initially hypokalemic- replaced in hosp  Lab Results  Component Value Date   WBC 7.0 11/01/2015   HGB 13.1 11/01/2015   HCT 40.5 11/01/2015   MCV 90.2 11/01/2015   PLT 135* 11/01/2015   bp remains quite elevated at home  Unsure how elevated  Cardiologist was aware -before hospitalization    This is labile Was px catapress patch by cardiol-very $ for her - she is wearing the patch right now and it is not helping much  On top of the pastch - has been taking clonidine oral .05 mg twice daily  Thought it was not very effective in the past  Daughter wondered about anxiety- with all the health problems she has had lately  Does not know what is making it go up  She has felt tense and anxious - cannot relax  Has had it before  Never had medicine for anxiety  Cough continues but it is much improved Some grey sputum occasionally    Already on lisinopril and metoprolol HR in 60s Has multiple heart problems   Lab Results  Component Value Date   HGBA1C 7.4* 08/24/2015    Is feeling not 100% but much better than she was  Set up PT to come- will start tomorrow  Needs an INR   Lab Results  Component Value Date   INR 2.87* 11/02/2015   INR 2.30* 11/01/2015   INR 3.22* 10/31/2015    Patient Active Problem List   Diagnosis Date Noted  . Hypokalemia 11/08/2015  . Anxiety 11/08/2015  . CAP (community acquired pneumonia) 11/08/2015  . Weakness 10/31/2015  . Urinary tract infectious disease   . Contusion   . Syncope 09/21/2015  . Bradycardia 09/21/2015  . Gait disorder 09/21/2015  . History of fall 08/24/2015  . Hearing loss 08/24/2015  . Chest wall  contusion 08/24/2015  . Cough 02/26/2015  . Lightheaded 11/06/2014  . Leukocytosis 08/07/2014  . Acute on chronic diastolic heart failure ([place]) 08/07/2014  . Encounter for therapeutic drug monitoring 08/06/2014  . Chronic diastolic CHF (congestive heart failure) ([place])   . CAD (coronary artery disease)   . Type II diabetes mellitus ([place])   . Hyperlipidemia   . Acute on chronic diastolic CHF (congestive heart failure), NYHA class 3 ([place]) 07/27/2014  . Atrial fibrillation ([place])   . Nausea without vomiting   . Transaminitis 07/20/2014  . Atrial flutter ([place]) 07/20/2014  . NSTEMI (non-ST elevated myocardial infarction) ([place]) 07/18/2014  . Dyslipidemia 07/18/2014  . Leg cramps 02/17/2014  . Encounter for Medicare annual wellness exam 08/19/2013  . Skin lesion 03/16/2013  . Other screening mammogram 05/17/2012  . Adult failure to thrive 05/17/2011  . Palpitations 02/16/2011  . DM2 (diabetes mellitus, type 2) ([place]) 02/14/2011  . ANEMIA, VITAMIN B12 DEFICIENCY 09/26/2010  . PEDAL EDEMA 03/16/2010  . [place] ALLERGY 09/06/2009  . Vitamin D deficiency 03/02/2009  . DIVERTICULITIS, COLON 11/12/2008  . COLONIC POLYPS, HX OF 09/02/2008  . HYPERCHOLESTEROLEMIA 12/10/2006  . Essential hypertension 12/10/2006  . Osteoporosis 03/18/2002   Past Medical History  Diagnosis Date  . Osteoporosis   . Hyperlipidemia   . Hypertension   .  Hypercalcemia   . CAD (coronary artery disease)     a. 1989 s/p MI, TPA, PTCA;  b. 06/2014 NSTEMI/Cath: LM nl, LAD 30-40, D2 90, small, LCX 142m OM1 90, RCA Cs2+ 70-80p, 50-676m90d (? ruptured plaque), EF 55-60%-->Med Rx.  . Marland Kitchenoster     past  . Diverticulosis   . Hx of adenomatous colonic polyps 2010    last colonscopy 2010,Dr ElVira Agar no need for further  colonoscopy given age  . PAF/PAFlutter     a. 06/2014->amio and coumadin started.  . Elevated LFTs     a. 06/2014 Abd U/S: no GB pathology.  . Chronic diastolic CHF (congestive heart failure) ([place]     a. 07/2014 Echo: EF 60-65%, Gr 1 DD, mild to mod TR, PASP 3759m.  . Myocardial infarction ([facility]  . Type II diabetes mellitus ([facility] . GERD (gastroesophageal reflux disease)   . Arthritis     "hips" (08/07/2014)   Past Surgical History  Procedure Laterality Date  . Vaginal hysterectomy  1970's?    ovaries intact  . Shoulder open rotator cuff repair Right 1980's  . Left heart catheterization with coronary angiogram N/A 07/22/2014    Procedure: LEFT HEART CATHETERIZATION WITH CORONARY ANGIOGRAM;  Surgeon: HenSinclair GroomsD;  [facility];  Service: Cardiovascular;  Laterality: N/A;  . Cardiac catheterization  07/22/2014  . Coronary angioplasty  1989    "@ [facility]"   Social History  Substance Use Topics  . Smoking status: Former Smoker -- 0.25 packs/day for 30 years    Types: Cigarettes  . Smokeless tobacco: Never Used     Comment: "quit smoking in the 1980's"  . Alcohol Use: No   Family History  Problem Relation Age of Onset  . Heart disease Mother     CAD  . Heart disease Father     CAD  . Heart disease Brother     CAD   Allergies  Allergen Reactions  . Alendronate Sodium Other (See Comments)    unknown  . Doxycycline Other (See Comments)    Inc LFT  . Isosorbide Other (See Comments)  . Lidocaine Nausea Only    Given at Dentist office  . Metformin And Related     Decreases appetite resulting in weight loss  . Metronidazole Nausea And Vomiting    REACTION: sick/ GI  . Nifedipine Other (See Comments)   Current Outpatient Prescriptions on File Prior to Visit  Medication Sig Dispense Refill  . ACCU-CHEK FASTCLIX LANCETS MISC Use as instructed to test blood sugar once daily or as needed.  Diagnosis:  E11.9   Non insulin-dependent. 102 each 4  . amiodarone (PACERONE) 200 MG tablet Take 1 tablet (200 mg total) by mouth daily. 180 tablet 3  . Blood Glucose Monitoring Suppl (ACCU-CHEK AVIVA PLUS) W/DEVICE KIT Use as instructed to test blood sugar once daily  or as needed.  Diagnosis:  E11.9  Non insulin-dependent. 1 kit 0  . cholecalciferol (VITAMIN D) 1000 UNITS tablet Take 2,000 Units by mouth daily at 12 noon.     . cloNIDine (CATAPRES - DOSED IN MG/24 HR) 0.2 mg/24hr patch Place 0.2 mg onto the skin once a week. ON WEDNESDAYS    . docusate sodium 100 MG CAPS Take 100 mg by mouth 2 (two) times daily. 10 capsule 0  . esomeprazole (NEXIUM) 40 MG capsule Take 1 capsule (40 mg total) by mouth daily at 12 noon. 90 capsule 2  . furosemide (  LASIX) 40 MG tablet Take 1 tablet (40 mg total) by mouth daily. As directed by cardiologist, Pt may take additional doses as needed 180 tablet 2  . glucose blood test strip Use as instructed to test blood sugar once daily or as needed.  Diagnosis:  E11.9   Non insulin-dependent.    Marland Kitchen lisinopril (PRINIVIL,ZESTRIL) 20 MG tablet TAKE 1 TABLET EVERY DAY (Patient taking differently: TAKE 1 TABLET EVERY DAY AT NOON) 90 tablet 2  . metoprolol tartrate (LOPRESSOR) 25 MG tablet Take 0.5 tablets (12.5 mg total) by mouth 2 (two) times daily. 180 tablet 3  . pravastatin (PRAVACHOL) 40 MG tablet Take 1 tablet (40 mg total) by mouth daily. 90 tablet 3  . sitaGLIPtin (JANUVIA) 100 MG tablet Take 1 tablet (100 mg total) by mouth daily. 90 tablet 2  . vitamin B-12 (CYANOCOBALAMIN) 1000 MCG tablet Take 1,000 mcg by mouth daily.    Marland Kitchen warfarin (COUMADIN) 3 MG tablet Take 0.5-1 tablets (1.5-3 mg total) by mouth daily. Take as directed by anti-coagulation clinic TAKE ONE-HALF OF A TABLET (1.5 MG) ON SUNDAYS,TUESDAYS,THURSDAYS AND SATURDAYS. TAKE 1 TABLET (3 MG) ON ALL OTHER DAYS 70 tablet 0  . nitroGLYCERIN (NITROSTAT) 0.4 MG SL tablet Place 1 tablet (0.4 mg total) under the tongue every 5 (five) minutes as needed. For chest pain and if not improved seek medical attention in ER. (Patient not taking: Reported on 11/08/2015) 15 tablet 0   No current facility-administered medications on file prior to visit.    Review of Systems Review of  Systems  Constitutional: Negative for fever, appetite change,  and unexpected weight change. pos for fatigue and generalized weakness  Eyes: Negative for pain and visual disturbance.  Respiratory: Negative for wheeze and shortness of breath.  neg for cough  Cardiovascular: Negative for cp or palpitations    Gastrointestinal: Negative for nausea, diarrhea and constipation.  Genitourinary: Negative for urgency and frequency.  Skin: Negative for pallor or rash   MSK neg for muscle cramps  Neurological: Negative for weakness, light-headedness, numbness and headaches.  Hematological: Negative for adenopathy. Does not bruise/bleed easily.  Psychiatric/Behavioral: Negative for dysphoric mood. The patient  not nervous/anxious.  Neg for SI or HI       Objective:   Physical Exam  Constitutional: She appears well-developed and well-nourished. No distress.  Frail appearing elderly female  HENT:  Head: Normocephalic and atraumatic.  Mouth/Throat: Oropharynx is clear and moist.  Eyes: Conjunctivae and EOM are normal. Pupils are equal, round, and reactive to light.  Neck: Normal range of motion. Neck supple. No JVD present. Carotid bruit is not present. No thyromegaly present.  Cardiovascular: Normal rate, normal heart sounds and intact distal pulses.  Exam reveals no gallop.   Pulmonary/Chest: Effort normal and breath sounds normal. No respiratory distress. She has no wheezes. She has no rales. She exhibits no tenderness.  No crackles  Harsh bs  Upper airway sounds cleared by cough    Abdominal: Soft. Bowel sounds are normal. She exhibits no distension, no abdominal bruit and no mass. There is no tenderness.  Musculoskeletal: She exhibits no edema or tenderness.  Lymphadenopathy:    She has no cervical adenopathy.  Neurological: She is alert. She has normal reflexes.  Skin: Skin is warm and dry. No rash noted. No pallor.  Psychiatric: Her speech is normal and behavior is normal. Thought  content normal. Her mood appears anxious. Her affect is not blunt, not labile and not inappropriate. Thought content is not paranoid.  She does not exhibit a depressed mood. She expresses no homicidal and no suicidal ideation.  Mildly anxious Attentive Not tearful           Assessment & Plan:   Problem List Items Addressed This Visit      Cardiovascular and Mediastinum   Essential hypertension - Primary    bp continues to be elevated despite catapress patch and additional clonidine po  BP: (!) 160/88 mmHg    Will make her cardiologist aware and ask for suggestion  No doubt- some anxiety effect -but not the whole reason  bp is very labile in pt with complex cardiac hx       Relevant Medications   cloNIDine (CATAPRES) 0.1 MG tablet   Other Relevant Orders   Comprehensive metabolic panel   TSH     Respiratory   CAP (community acquired pneumonia)    Recently hospitalized- she developed opacity at L base Dx after weakness and confusion and fall (also pos ua but neg urine cx) Now finishing abx -much improved Reassuring exam  Rev hospital records and studies and labs in detail Seems to be getting closer to baseline  To start home PT soon - tomorrow          Other   Anxiety    Pt admits to having more anxiety than she prev let on Reviewed stressors/ coping techniques/symptoms/ support sources/ tx options and side effects in detail today States she cannot wind down or relax  Will px buspar 7.5 bid - in hopes of avoiding meds of habit or sedation  Discussed expectations of SSRI medication including time to effectiveness and mechanism of action, also poss of side effects (early and late)- including mental fuzziness, weight or appetite change, nausea and poss of worse dep or anxiety (even suicidal thoughts)  Pt voiced understanding and will stop med and update if this occurs  F/u planned Will update if not helpful         Relevant Medications   busPIRone (BUSPAR) 15 MG  tablet   Encounter for therapeutic drug monitoring    Due for INR s/p hosp for pneumonia tx with augmentin  Lab today No bleeding or dosing confusion       Relevant Orders   INR (Completed)   Hypokalemia    Low K in hospital when sick- was corrected Not sent home on K Level today      Relevant Orders   Comprehensive metabolic panel   Transaminitis    Re check today      Relevant Orders   Comprehensive metabolic panel

## 2015-11-08 NOTE — Assessment & Plan Note (Signed)
Recheck today. 

## 2015-11-08 NOTE — Assessment & Plan Note (Signed)
Pt admits to having more anxiety than she prev let on Reviewed stressors/ coping techniques/symptoms/ support sources/ tx options and side effects in detail today States she cannot wind down or relax  Will px buspar 7.5 bid - in hopes of avoiding meds of habit or sedation  Discussed expectations of SSRI medication including time to effectiveness and mechanism of action, also poss of side effects (early and late)- including mental fuzziness, weight or appetite change, nausea and poss of worse dep or anxiety (even suicidal thoughts)  Pt voiced understanding and will stop med and update if this occurs  F/u planned Will update if not helpful

## 2015-11-08 NOTE — Assessment & Plan Note (Signed)
Recently hospitalized- she developed opacity at L base Dx after weakness and confusion and fall (also pos ua but neg urine cx) Now finishing abx -much improved Reassuring exam  Rev hospital records and studies and labs in detail Seems to be getting closer to baseline  To start home PT soon - tomorrow

## 2015-11-08 NOTE — Assessment & Plan Note (Signed)
Low K in hospital when sick- was corrected Not sent home on K Level today

## 2015-11-08 NOTE — Telephone Encounter (Signed)
Erroneous encounter

## 2015-11-08 NOTE — Assessment & Plan Note (Signed)
Due for INR s/p hosp for pneumonia tx with augmentin  Lab today No bleeding or dosing confusion

## 2015-11-09 DIAGNOSIS — I4891 Unspecified atrial fibrillation: Secondary | ICD-10-CM | POA: Diagnosis not present

## 2015-11-09 DIAGNOSIS — K5790 Diverticulosis of intestine, part unspecified, without perforation or abscess without bleeding: Secondary | ICD-10-CM | POA: Diagnosis not present

## 2015-11-09 DIAGNOSIS — I5032 Chronic diastolic (congestive) heart failure: Secondary | ICD-10-CM | POA: Diagnosis not present

## 2015-11-09 DIAGNOSIS — J189 Pneumonia, unspecified organism: Secondary | ICD-10-CM | POA: Diagnosis not present

## 2015-11-09 DIAGNOSIS — I252 Old myocardial infarction: Secondary | ICD-10-CM | POA: Diagnosis not present

## 2015-11-09 DIAGNOSIS — I2510 Atherosclerotic heart disease of native coronary artery without angina pectoris: Secondary | ICD-10-CM | POA: Diagnosis not present

## 2015-11-09 DIAGNOSIS — E119 Type 2 diabetes mellitus without complications: Secondary | ICD-10-CM | POA: Diagnosis not present

## 2015-11-09 DIAGNOSIS — I110 Hypertensive heart disease with heart failure: Secondary | ICD-10-CM | POA: Diagnosis not present

## 2015-11-09 DIAGNOSIS — Z9181 History of falling: Secondary | ICD-10-CM | POA: Diagnosis not present

## 2015-11-09 LAB — COMPREHENSIVE METABOLIC PANEL
ALBUMIN: 4 g/dL (ref 3.5–5.2)
ALK PHOS: 50 U/L (ref 39–117)
ALT: 99 U/L — ABNORMAL HIGH (ref 0–35)
AST: 91 U/L — AB (ref 0–37)
BUN: 17 mg/dL (ref 6–23)
CO2: 27 mEq/L (ref 19–32)
CREATININE: 0.93 mg/dL (ref 0.40–1.20)
Calcium: 9.5 mg/dL (ref 8.4–10.5)
Chloride: 98 mEq/L (ref 96–112)
GFR: 60.58 mL/min (ref >60.00)
Glucose, Bld: 164 mg/dL — ABNORMAL HIGH (ref 70–99)
Potassium: 4.1 mEq/L (ref 3.5–5.1)
Sodium: 135 mEq/L (ref 135–145)
TOTAL PROTEIN: 7.7 g/dL (ref 6.0–8.3)
Total Bilirubin: 0.5 mg/dL (ref 0.2–1.2)

## 2015-11-09 LAB — TSH: TSH: 5.04 u[IU]/mL — AB (ref 0.35–4.50)

## 2015-11-09 NOTE — Progress Notes (Signed)
Pre visit review using our clinic review tool, if applicable. No additional management support is needed unless otherwise documented below in the visit note. 

## 2015-11-11 ENCOUNTER — Encounter: Payer: Self-pay | Admitting: Family Medicine

## 2015-11-11 ENCOUNTER — Telehealth: Payer: Self-pay | Admitting: Family Medicine

## 2015-11-11 DIAGNOSIS — K5790 Diverticulosis of intestine, part unspecified, without perforation or abscess without bleeding: Secondary | ICD-10-CM | POA: Diagnosis not present

## 2015-11-11 DIAGNOSIS — E119 Type 2 diabetes mellitus without complications: Secondary | ICD-10-CM | POA: Diagnosis not present

## 2015-11-11 DIAGNOSIS — I4891 Unspecified atrial fibrillation: Secondary | ICD-10-CM | POA: Diagnosis not present

## 2015-11-11 DIAGNOSIS — I2510 Atherosclerotic heart disease of native coronary artery without angina pectoris: Secondary | ICD-10-CM | POA: Diagnosis not present

## 2015-11-11 DIAGNOSIS — Z9181 History of falling: Secondary | ICD-10-CM | POA: Diagnosis not present

## 2015-11-11 DIAGNOSIS — I252 Old myocardial infarction: Secondary | ICD-10-CM | POA: Diagnosis not present

## 2015-11-11 DIAGNOSIS — I5032 Chronic diastolic (congestive) heart failure: Secondary | ICD-10-CM | POA: Diagnosis not present

## 2015-11-11 DIAGNOSIS — J189 Pneumonia, unspecified organism: Secondary | ICD-10-CM | POA: Diagnosis not present

## 2015-11-11 DIAGNOSIS — I110 Hypertensive heart disease with heart failure: Secondary | ICD-10-CM | POA: Diagnosis not present

## 2015-11-11 MED ORDER — LEVOTHYROXINE SODIUM 25 MCG PO TABS: 25.0000 ug | ORAL_TABLET | Freq: Every day | ORAL | Status: DC

## 2015-11-11 NOTE — Telephone Encounter (Signed)
Starting levothyroxine.

## 2015-11-12 ENCOUNTER — Telehealth: Payer: Self-pay | Admitting: Family Medicine

## 2015-11-12 MED ORDER — CLONIDINE HCL 0.2 MG PO TABS: 0.2000 mg | ORAL_TABLET | Freq: Two times a day (BID) | ORAL | Status: DC

## 2015-11-12 MED ORDER — PRAVASTATIN SODIUM 20 MG PO TABS: 20.0000 mg | ORAL_TABLET | Freq: Every day | ORAL | Status: DC

## 2015-11-12 MED ORDER — HYDRALAZINE HCL 50 MG PO TABS: 50.0000 mg | ORAL_TABLET | Freq: Two times a day (BID) | ORAL | Status: DC

## 2015-11-12 NOTE — Telephone Encounter (Signed)
I already outlined this to pt's daughter [name] -but please call pt to re-iterate the plan (I conferred with her cardiologist)   1) change clonidine to 0.2 mg twice daily 2) add hydralazine 50 mg 1 pill twice daily (then later if bp is too high - with systolic of 0000000 we can add a 3rd pill.)   (we will see how it goes)  3) cut the pravastatin to 20 mg daily- she can cut what she has in 1/2 and the new px I sent will be for 20 so she does not have to keep doing that (hope this will take a bit of stress off her liver)  If any side effects or problems please let me know F/u with me in 4-6 weeks and we will check labs that day

## 2015-11-12 NOTE — Telephone Encounter (Signed)
Patient advised.

## 2015-11-16 DIAGNOSIS — J189 Pneumonia, unspecified organism: Secondary | ICD-10-CM | POA: Diagnosis not present

## 2015-11-16 DIAGNOSIS — I5032 Chronic diastolic (congestive) heart failure: Secondary | ICD-10-CM | POA: Diagnosis not present

## 2015-11-16 DIAGNOSIS — I252 Old myocardial infarction: Secondary | ICD-10-CM | POA: Diagnosis not present

## 2015-11-16 DIAGNOSIS — I2510 Atherosclerotic heart disease of native coronary artery without angina pectoris: Secondary | ICD-10-CM | POA: Diagnosis not present

## 2015-11-16 DIAGNOSIS — Z9181 History of falling: Secondary | ICD-10-CM | POA: Diagnosis not present

## 2015-11-16 DIAGNOSIS — I110 Hypertensive heart disease with heart failure: Secondary | ICD-10-CM | POA: Diagnosis not present

## 2015-11-16 DIAGNOSIS — E119 Type 2 diabetes mellitus without complications: Secondary | ICD-10-CM | POA: Diagnosis not present

## 2015-11-16 DIAGNOSIS — I4891 Unspecified atrial fibrillation: Secondary | ICD-10-CM | POA: Diagnosis not present

## 2015-11-16 DIAGNOSIS — K5790 Diverticulosis of intestine, part unspecified, without perforation or abscess without bleeding: Secondary | ICD-10-CM | POA: Diagnosis not present

## 2015-11-18 DIAGNOSIS — I2510 Atherosclerotic heart disease of native coronary artery without angina pectoris: Secondary | ICD-10-CM | POA: Diagnosis not present

## 2015-11-18 DIAGNOSIS — E119 Type 2 diabetes mellitus without complications: Secondary | ICD-10-CM | POA: Diagnosis not present

## 2015-11-18 DIAGNOSIS — I5032 Chronic diastolic (congestive) heart failure: Secondary | ICD-10-CM | POA: Diagnosis not present

## 2015-11-18 DIAGNOSIS — I252 Old myocardial infarction: Secondary | ICD-10-CM | POA: Diagnosis not present

## 2015-11-18 DIAGNOSIS — I110 Hypertensive heart disease with heart failure: Secondary | ICD-10-CM | POA: Diagnosis not present

## 2015-11-18 DIAGNOSIS — K5790 Diverticulosis of intestine, part unspecified, without perforation or abscess without bleeding: Secondary | ICD-10-CM | POA: Diagnosis not present

## 2015-11-18 DIAGNOSIS — J189 Pneumonia, unspecified organism: Secondary | ICD-10-CM | POA: Diagnosis not present

## 2015-11-18 DIAGNOSIS — I4891 Unspecified atrial fibrillation: Secondary | ICD-10-CM | POA: Diagnosis not present

## 2015-11-18 DIAGNOSIS — Z9181 History of falling: Secondary | ICD-10-CM | POA: Diagnosis not present

## 2015-11-19 ENCOUNTER — Telehealth: Payer: Self-pay | Admitting: *Deleted

## 2015-11-19 NOTE — Telephone Encounter (Signed)
Glad to hear it. 

## 2015-11-19 NOTE — Telephone Encounter (Signed)
Patient reports that blood pressure readings are doing great on the new regimen of medication.  Follow up appt is scheduled in 4-6 weeks.  Appreciation is relayed to both Dr. Glori Bickers and Dr. Rockey Situ.

## 2015-11-23 DIAGNOSIS — K5790 Diverticulosis of intestine, part unspecified, without perforation or abscess without bleeding: Secondary | ICD-10-CM | POA: Diagnosis not present

## 2015-11-23 DIAGNOSIS — I4891 Unspecified atrial fibrillation: Secondary | ICD-10-CM | POA: Diagnosis not present

## 2015-11-23 DIAGNOSIS — I110 Hypertensive heart disease with heart failure: Secondary | ICD-10-CM | POA: Diagnosis not present

## 2015-11-23 DIAGNOSIS — I2510 Atherosclerotic heart disease of native coronary artery without angina pectoris: Secondary | ICD-10-CM | POA: Diagnosis not present

## 2015-11-23 DIAGNOSIS — J189 Pneumonia, unspecified organism: Secondary | ICD-10-CM | POA: Diagnosis not present

## 2015-11-23 DIAGNOSIS — I252 Old myocardial infarction: Secondary | ICD-10-CM | POA: Diagnosis not present

## 2015-11-23 DIAGNOSIS — I5032 Chronic diastolic (congestive) heart failure: Secondary | ICD-10-CM | POA: Diagnosis not present

## 2015-11-23 DIAGNOSIS — E119 Type 2 diabetes mellitus without complications: Secondary | ICD-10-CM | POA: Diagnosis not present

## 2015-11-23 DIAGNOSIS — Z9181 History of falling: Secondary | ICD-10-CM | POA: Diagnosis not present

## 2015-11-25 ENCOUNTER — Ambulatory Visit (INDEPENDENT_AMBULATORY_CARE_PROVIDER_SITE_OTHER): Payer: Commercial Managed Care - HMO | Admitting: *Deleted

## 2015-11-25 DIAGNOSIS — E119 Type 2 diabetes mellitus without complications: Secondary | ICD-10-CM | POA: Diagnosis not present

## 2015-11-25 DIAGNOSIS — J189 Pneumonia, unspecified organism: Secondary | ICD-10-CM | POA: Diagnosis not present

## 2015-11-25 DIAGNOSIS — Z9181 History of falling: Secondary | ICD-10-CM | POA: Diagnosis not present

## 2015-11-25 DIAGNOSIS — I4891 Unspecified atrial fibrillation: Secondary | ICD-10-CM

## 2015-11-25 DIAGNOSIS — I2510 Atherosclerotic heart disease of native coronary artery without angina pectoris: Secondary | ICD-10-CM | POA: Diagnosis not present

## 2015-11-25 DIAGNOSIS — I4892 Unspecified atrial flutter: Secondary | ICD-10-CM

## 2015-11-25 DIAGNOSIS — K5790 Diverticulosis of intestine, part unspecified, without perforation or abscess without bleeding: Secondary | ICD-10-CM | POA: Diagnosis not present

## 2015-11-25 DIAGNOSIS — I252 Old myocardial infarction: Secondary | ICD-10-CM | POA: Diagnosis not present

## 2015-11-25 DIAGNOSIS — I110 Hypertensive heart disease with heart failure: Secondary | ICD-10-CM | POA: Diagnosis not present

## 2015-11-25 DIAGNOSIS — Z5181 Encounter for therapeutic drug level monitoring: Secondary | ICD-10-CM | POA: Diagnosis not present

## 2015-11-25 DIAGNOSIS — I5032 Chronic diastolic (congestive) heart failure: Secondary | ICD-10-CM | POA: Diagnosis not present

## 2015-11-25 LAB — POCT INR: INR: 3.4

## 2015-11-25 NOTE — Progress Notes (Signed)
Pre visit review using our clinic review tool, if applicable. No additional management support is needed unless otherwise documented below in the visit note. 

## 2015-11-25 NOTE — Progress Notes (Signed)
INR is supra therapeutic today.  Patient denies and extra doses or diet changes.  Will hold dose today, then continue as is.  Patient encouraged to eat a good serving of greens today.  Will recheck in 3-4 weeks and make additional adjustments at that time if needed.

## 2015-11-29 ENCOUNTER — Other Ambulatory Visit: Payer: Self-pay | Admitting: *Deleted

## 2015-11-29 MED ORDER — BUSPIRONE HCL 15 MG PO TABS: 7.5000 mg | ORAL_TABLET | Freq: Two times a day (BID) | ORAL | Status: DC

## 2015-11-30 DIAGNOSIS — K5790 Diverticulosis of intestine, part unspecified, without perforation or abscess without bleeding: Secondary | ICD-10-CM | POA: Diagnosis not present

## 2015-11-30 DIAGNOSIS — E119 Type 2 diabetes mellitus without complications: Secondary | ICD-10-CM | POA: Diagnosis not present

## 2015-11-30 DIAGNOSIS — J189 Pneumonia, unspecified organism: Secondary | ICD-10-CM | POA: Diagnosis not present

## 2015-11-30 DIAGNOSIS — I110 Hypertensive heart disease with heart failure: Secondary | ICD-10-CM | POA: Diagnosis not present

## 2015-11-30 DIAGNOSIS — I5032 Chronic diastolic (congestive) heart failure: Secondary | ICD-10-CM | POA: Diagnosis not present

## 2015-11-30 DIAGNOSIS — Z9181 History of falling: Secondary | ICD-10-CM | POA: Diagnosis not present

## 2015-11-30 DIAGNOSIS — I4891 Unspecified atrial fibrillation: Secondary | ICD-10-CM | POA: Diagnosis not present

## 2015-11-30 DIAGNOSIS — I2510 Atherosclerotic heart disease of native coronary artery without angina pectoris: Secondary | ICD-10-CM | POA: Diagnosis not present

## 2015-11-30 DIAGNOSIS — I252 Old myocardial infarction: Secondary | ICD-10-CM | POA: Diagnosis not present

## 2015-12-02 DIAGNOSIS — I252 Old myocardial infarction: Secondary | ICD-10-CM | POA: Diagnosis not present

## 2015-12-02 DIAGNOSIS — I110 Hypertensive heart disease with heart failure: Secondary | ICD-10-CM | POA: Diagnosis not present

## 2015-12-02 DIAGNOSIS — I2510 Atherosclerotic heart disease of native coronary artery without angina pectoris: Secondary | ICD-10-CM | POA: Diagnosis not present

## 2015-12-02 DIAGNOSIS — Z9181 History of falling: Secondary | ICD-10-CM | POA: Diagnosis not present

## 2015-12-02 DIAGNOSIS — E119 Type 2 diabetes mellitus without complications: Secondary | ICD-10-CM | POA: Diagnosis not present

## 2015-12-02 DIAGNOSIS — I4891 Unspecified atrial fibrillation: Secondary | ICD-10-CM | POA: Diagnosis not present

## 2015-12-02 DIAGNOSIS — J189 Pneumonia, unspecified organism: Secondary | ICD-10-CM | POA: Diagnosis not present

## 2015-12-02 DIAGNOSIS — I5032 Chronic diastolic (congestive) heart failure: Secondary | ICD-10-CM | POA: Diagnosis not present

## 2015-12-02 DIAGNOSIS — K5790 Diverticulosis of intestine, part unspecified, without perforation or abscess without bleeding: Secondary | ICD-10-CM | POA: Diagnosis not present

## 2015-12-06 ENCOUNTER — Encounter: Payer: Self-pay | Admitting: *Deleted

## 2015-12-06 ENCOUNTER — Other Ambulatory Visit: Payer: Self-pay | Admitting: *Deleted

## 2015-12-06 MED ORDER — SITAGLIPTIN PHOSPHATE 100 MG PO TABS: 100.0000 mg | ORAL_TABLET | Freq: Every day | ORAL | Status: DC

## 2015-12-06 MED ORDER — CLONIDINE HCL 0.2 MG PO TABS: 0.2000 mg | ORAL_TABLET | Freq: Two times a day (BID) | ORAL | Status: DC

## 2015-12-06 MED ORDER — PRAVASTATIN SODIUM 20 MG PO TABS: 20.0000 mg | ORAL_TABLET | Freq: Every day | ORAL | Status: DC

## 2015-12-06 MED ORDER — LISINOPRIL 20 MG PO TABS: ORAL_TABLET | ORAL | Status: DC

## 2015-12-06 MED ORDER — HYDRALAZINE HCL 50 MG PO TABS
50.0000 mg | ORAL_TABLET | Freq: Two times a day (BID) | ORAL | Status: DC
Start: 2015-12-06 — End: 2015-12-08

## 2015-12-08 ENCOUNTER — Encounter: Payer: Self-pay | Admitting: Cardiovascular Disease

## 2015-12-08 ENCOUNTER — Ambulatory Visit (INDEPENDENT_AMBULATORY_CARE_PROVIDER_SITE_OTHER): Payer: Commercial Managed Care - HMO | Admitting: Cardiovascular Disease

## 2015-12-08 VITALS — BP 172/86 | HR 57 | Ht 62.0 in | Wt 113.5 lb

## 2015-12-08 DIAGNOSIS — I10 Essential (primary) hypertension: Secondary | ICD-10-CM

## 2015-12-08 DIAGNOSIS — R55 Syncope and collapse: Secondary | ICD-10-CM | POA: Diagnosis not present

## 2015-12-08 DIAGNOSIS — E7800 Pure hypercholesterolemia, unspecified: Secondary | ICD-10-CM

## 2015-12-08 DIAGNOSIS — I5033 Acute on chronic diastolic (congestive) heart failure: Secondary | ICD-10-CM | POA: Diagnosis not present

## 2015-12-08 DIAGNOSIS — I481 Persistent atrial fibrillation: Secondary | ICD-10-CM | POA: Diagnosis not present

## 2015-12-08 DIAGNOSIS — I4819 Other persistent atrial fibrillation: Secondary | ICD-10-CM

## 2015-12-08 MED ORDER — HYDRALAZINE HCL 100 MG PO TABS: 100.0000 mg | ORAL_TABLET | Freq: Three times a day (TID) | ORAL | Status: DC

## 2015-12-08 NOTE — Progress Notes (Signed)
Patient ID: Bianca Mueller, female    DOB: March 05, 1926, 82 y.o.   MRN: [id number]  HPI Comments: 82 year old female with history of non-ST elevation MI complicated by paroxysmal atrial fibrillation and flutter who presents for follow-up of her coronary artery disease and atrial flutter. Prior non-ST elevation MI October 2015 On her last clinic visit blood pressure is very high On a prior clinic visit, she reported having episode of syncope. 30 day monitor was ordered, she declined   in follow-up today, she presents alone Daughter does most of her medications, puts them in a pillbox for her to take Other than the office notes, no pressure recordings available for review. Blood pressure on today's visit 829 systolic, this stayed high for me as well on repeat check. She did not appear nervous/anxious. Reports it is high at times  Review of previous notes shows a labile blood pressure, sometimes 140s, often 180.  Reports she is tolerating her current medications which include clonidine 0.2 mg twice a day, hydralazine 50 mg twice a day, Lasix, lisinopril 20 mg daily, metoprolol 12.5 mg daily  She denies any episodes of syncope She indicated today that she did not want to wear a 30 day monitor and she was not having any near syncope or further episodes of syncope  She did have a fall, fell between the bed and dresser, hurt her head Feels it was from her balance  EKG on today's visit shows normal sinus rhythm with rate 57 bpm,Nonspecific ST abnormality  Past medical history Previous fall as she was getting out of her car in the garage Feels her feet slipped, did not have good balance, went down and hurt her ribs Denies any loss of consciousness, near syncope or dizziness  no further falls since that time Reports that her balance is okay, does not need a walker  hemoglobin A1c 7.4, total cholesterol 150  In the hospital she had atrial fibrillation/flutter with RVR, started on diltiazem  and amiodarone  cardiac catheterization showing severe diagonal, circumflex OM1, and RCA disease, medical management recommended Started on Coumadin and Plavix  follow-up in clinic she was still in atrial flutter with rate 86 bpm, normal sinus rhythm on her last clinic visit  Previous 2-D echocardiogram showed normal LV function.   Allergies  Allergen Reactions  . Alendronate Sodium Other (See Comments)    unknown  . Doxycycline Other (See Comments)    Inc LFT  . Isosorbide Other (See Comments)  . Lidocaine Nausea Only    Given at Dentist office  . Metformin And Related     Decreases appetite resulting in weight loss  . Metronidazole Nausea And Vomiting    REACTION: sick/ GI  . Nifedipine Other (See Comments)    Outpatient Encounter Prescriptions as of 12/08/2015  Medication Sig  . ACCU-CHEK FASTCLIX LANCETS MISC Use as instructed to test blood sugar once daily or as needed.  Diagnosis:  E11.9   Non insulin-dependent.  Marland Kitchen amiodarone (PACERONE) 200 MG tablet Take 1 tablet (200 mg total) by mouth daily.  . Blood Glucose Monitoring Suppl (ACCU-CHEK AVIVA PLUS) W/DEVICE KIT Use as instructed to test blood sugar once daily or as needed.  Diagnosis:  E11.9  Non insulin-dependent.  . busPIRone (BUSPAR) 15 MG tablet Take 0.5 tablets (7.5 mg total) by mouth 2 (two) times daily.  . cholecalciferol (VITAMIN D) 1000 UNITS tablet Take 2,000 Units by mouth daily at 12 noon.   . cloNIDine (CATAPRES) 0.2 MG tablet Take 1 tablet (  0.2 mg total) by mouth 2 (two) times daily.  Marland Kitchen docusate sodium 100 MG CAPS Take 100 mg by mouth 2 (two) times daily.  . furosemide (LASIX) 40 MG tablet Take 1 tablet (40 mg total) by mouth daily. As directed by cardiologist, Pt may take additional doses as needed  . glucose blood test strip Use as instructed to test blood sugar once daily or as needed.  Diagnosis:  E11.9   Non insulin-dependent.  . hydrALAZINE (APRESOLINE) 100 MG tablet Take 1 tablet (100 mg total) by mouth  3 (three) times daily.  Marland Kitchen levothyroxine (LEVOTHROID) 25 MCG tablet Take 1 tablet (25 mcg total) by mouth daily before breakfast.  . lisinopril (PRINIVIL,ZESTRIL) 20 MG tablet TAKE 1 TABLET EVERY DAY AT NOON  . metoprolol tartrate (LOPRESSOR) 25 MG tablet Take 0.5 tablets (12.5 mg total) by mouth 2 (two) times daily.  . nitroGLYCERIN (NITROSTAT) 0.4 MG SL tablet Place 1 tablet (0.4 mg total) under the tongue every 5 (five) minutes as needed. For chest pain and if not improved seek medical attention in ER.  . pravastatin (PRAVACHOL) 20 MG tablet Take 1 tablet (20 mg total) by mouth daily.  . sitaGLIPtin (JANUVIA) 100 MG tablet Take 1 tablet (100 mg total) by mouth daily.  . vitamin B-12 (CYANOCOBALAMIN) 1000 MCG tablet Take 1,000 mcg by mouth daily.  Marland Kitchen warfarin (COUMADIN) 3 MG tablet Take 0.5-1 tablets (1.5-3 mg total) by mouth daily. Take as directed by anti-coagulation clinic TAKE ONE-HALF OF A TABLET (1.5 MG) ON SUNDAYS,TUESDAYS,THURSDAYS AND SATURDAYS. TAKE 1 TABLET (3 MG) ON ALL OTHER DAYS  . [DISCONTINUED] hydrALAZINE (APRESOLINE) 50 MG tablet Take 1 tablet (50 mg total) by mouth 2 (two) times daily.  . [DISCONTINUED] esomeprazole (NEXIUM) 40 MG capsule Take 1 capsule (40 mg total) by mouth daily at 12 noon. (Patient not taking: Reported on 12/08/2015)   No facility-administered encounter medications on file as of 12/08/2015.    Past Medical History  Diagnosis Date  . Osteoporosis   . Hyperlipidemia   . Hypertension   . Hypercalcemia   . CAD (coronary artery disease)     a. 1989 s/p MI, TPA, PTCA;  b. 06/2014 NSTEMI/Cath: LM nl, LAD 30-40, D2 90, small, LCX 182m OM1 90, RCA Cs2+ 70-80p, 50-682m90d (? ruptured plaque), EF 55-60%-->Med Rx.  . Marland Kitchenoster     past  . Diverticulosis   . Hx of adenomatous colonic polyps 2010    last colonscopy 2010,Dr ElVira Agar no need for further  colonoscopy given age  . PAF/PAFlutter     a. 06/2014->amio and coumadin started.  . Elevated LFTs     a.  06/2014 Abd U/S: no GB pathology.  . Chronic diastolic CHF (congestive heart failure) ([place]    a. 07/2014 Echo: EF 60-65%, Gr 1 DD, mild to mod TR, PASP 3724m.  . Myocardial infarction ([facility]  . Type II diabetes mellitus ([facility] . GERD (gastroesophageal reflux disease)   . Arthritis     "hips" (08/07/2014)    Past Surgical History  Procedure Laterality Date  . Vaginal hysterectomy  1970's?    ovaries intact  . Shoulder open rotator cuff repair Right 1980's  . Left heart catheterization with coronary angiogram N/A 07/22/2014    Procedure: LEFT HEART CATHETERIZATION WITH CORONARY ANGIOGRAM;  Surgeon: HenSinclair GroomsD;  [facility];  Service: Cardiovascular;  Laterality: N/A;  . Cardiac catheterization  07/22/2014  . Coronary angioplasty  1989    "@  [facility]"    Social History  reports that she has quit smoking. Her smoking use included Cigarettes. She has a 7.5 pack-year smoking history. She has never used smokeless tobacco. She reports that she does not drink alcohol or use illicit drugs.  Family History family history includes Heart disease in her brother, father, and mother.   Review of Systems  Constitutional: Negative.   Respiratory: Negative.   Cardiovascular: Negative.   Gastrointestinal: Negative.   Musculoskeletal: Negative.   Neurological: Negative.   Hematological: Negative.   Psychiatric/Behavioral: Negative.   All other systems reviewed and are negative.  BP 172/86 mmHg  Pulse 57  Ht 5' 2"  (1.575 m)  Wt 113 lb 8 oz (51.483 kg)  BMI 20.75 kg/m2   Physical Exam  Constitutional: She is oriented to person, place, and time.  Thin appearing  HENT:  Head: Normocephalic.  Nose: Nose normal.  Mouth/Throat: Oropharynx is clear and moist.  Eyes: Conjunctivae are normal. Pupils are equal, round, and reactive to light.  Neck: Normal range of motion. Neck supple. No JVD present.  Cardiovascular: Normal rate, regular rhythm, S1 normal, S2 normal,  normal heart sounds and intact distal pulses.  Exam reveals no gallop and no friction rub.   No murmur heard. Pulmonary/Chest: Effort normal and breath sounds normal. No respiratory distress. She has no wheezes. She has no rales. She exhibits no tenderness.  Abdominal: Soft. Bowel sounds are normal. She exhibits no distension. There is no tenderness.  Musculoskeletal: Normal range of motion. She exhibits no edema or tenderness.  Lymphadenopathy:    She has no cervical adenopathy.  Neurological: She is alert and oriented to person, place, and time. Coordination normal.  Skin: Skin is warm and dry. No rash noted. No erythema.  Psychiatric: She has a normal mood and affect. Her behavior is normal. Judgment and thought content normal.    Assessment and Plan  Nursing note and vitals reviewed.

## 2015-12-08 NOTE — Assessment & Plan Note (Signed)
She appears relatively euvolemic on today's visit. Apart from blood pressure medication changes, no other medication changes made. She is on Lasix daily

## 2015-12-08 NOTE — Patient Instructions (Addendum)
Blood pressure is elevated, Pressure is >170 on todays visit  Please increase the hydralazine up to 100 mg twice a day Please monitor the pressure and call if pressures run high  Please call us if you have new issues that need to be addressed before your next appt.  Your physician wants you to follow-up in: 2 month.

## 2015-12-08 NOTE — Assessment & Plan Note (Signed)
Cholesterol is at goal on the current lipid regimen. No changes to the medications were made.  

## 2015-12-08 NOTE — Assessment & Plan Note (Signed)
Blood pressure elevated on today's visit, even on recheck She does not have any numbers from home measurements for our review Recommended she increase hydralazine up to 100 mg 3 times a day, up from 50 mill grams twice a day Suggested she monitor her blood pressure. If it runs high in the afternoon, may need additional dose after lunch  Other medication options include isosorbide, Cardura, high-dose lisinopril

## 2015-12-08 NOTE — Assessment & Plan Note (Signed)
History of syncope, previously we recommended 30 day monitor to rule out arrhythmia, pauses She has declined 30 day monitor, reports that she has not had any further symptoms

## 2015-12-23 ENCOUNTER — Ambulatory Visit (INDEPENDENT_AMBULATORY_CARE_PROVIDER_SITE_OTHER): Payer: Commercial Managed Care - HMO | Admitting: *Deleted

## 2015-12-23 DIAGNOSIS — Z5181 Encounter for therapeutic drug level monitoring: Secondary | ICD-10-CM

## 2015-12-23 DIAGNOSIS — I4891 Unspecified atrial fibrillation: Secondary | ICD-10-CM | POA: Diagnosis not present

## 2015-12-23 DIAGNOSIS — I4892 Unspecified atrial flutter: Secondary | ICD-10-CM

## 2015-12-23 LAB — POCT INR: INR: 1.5

## 2015-12-23 NOTE — Progress Notes (Signed)
Pre visit review using our clinic review tool, if applicable. No additional management support is needed unless otherwise documented below in the visit note. 

## 2015-12-23 NOTE — Progress Notes (Signed)
INR is sub therapeutic today.  Patient denies any missed doses, new medications, or diet changes.  Will boost x2 days, then continue as scheduled.  Patient encouraged to avoid greens for the next 2-3 days, then to remain consistent beyond that.  Will recheck in 2-3 weeks and make additional adjustments at that time if needed.

## 2015-12-30 ENCOUNTER — Other Ambulatory Visit: Payer: Self-pay

## 2015-12-30 MED ORDER — DOXAZOSIN MESYLATE 4 MG PO TABS: 4.0000 mg | ORAL_TABLET | Freq: Two times a day (BID) | ORAL | Status: DC

## 2016-01-04 ENCOUNTER — Ambulatory Visit (INDEPENDENT_AMBULATORY_CARE_PROVIDER_SITE_OTHER): Payer: Commercial Managed Care - HMO | Admitting: Family Medicine

## 2016-01-04 ENCOUNTER — Encounter: Payer: Self-pay | Admitting: Family Medicine

## 2016-01-04 VITALS — BP 144/76 | HR 57 | Temp 97.9°F | Ht 61.0 in | Wt 113.5 lb

## 2016-01-04 DIAGNOSIS — R740 Nonspecific elevation of levels of transaminase and lactic acid dehydrogenase [LDH]: Secondary | ICD-10-CM | POA: Diagnosis not present

## 2016-01-04 DIAGNOSIS — I10 Essential (primary) hypertension: Secondary | ICD-10-CM

## 2016-01-04 DIAGNOSIS — R7401 Elevation of levels of liver transaminase levels: Secondary | ICD-10-CM

## 2016-01-04 DIAGNOSIS — E1159 Type 2 diabetes mellitus with other circulatory complications: Secondary | ICD-10-CM | POA: Diagnosis not present

## 2016-01-04 LAB — LIPID PANEL
CHOL/HDL RATIO: 4
Cholesterol: 172 mg/dL (ref 0–200)
HDL: 44.8 mg/dL (ref >39.00)
LDL Cholesterol: 96 mg/dL (ref 0–99)
NONHDL: 127.16
Triglycerides: 156 mg/dL — ABNORMAL HIGH (ref 0.0–149.0)
VLDL: 31.2 mg/dL (ref 0.0–40.0)

## 2016-01-04 LAB — COMPREHENSIVE METABOLIC PANEL
ALBUMIN: 4.1 g/dL (ref 3.5–5.2)
ALT: 128 U/L — ABNORMAL HIGH (ref 0–35)
AST: 141 U/L — ABNORMAL HIGH (ref 0–37)
Alkaline Phosphatase: 55 U/L (ref 39–117)
BUN: 17 mg/dL (ref 6–23)
CALCIUM: 9.7 mg/dL (ref 8.4–10.5)
CHLORIDE: 96 meq/L (ref 96–112)
CO2: 30 meq/L (ref 19–32)
Creatinine, Ser: 0.87 mg/dL (ref 0.40–1.20)
GFR: 65.4 mL/min (ref >60.00)
Glucose, Bld: 230 mg/dL — ABNORMAL HIGH (ref 70–99)
POTASSIUM: 4.2 meq/L (ref 3.5–5.1)
Sodium: 134 mEq/L — ABNORMAL LOW (ref 135–145)
Total Bilirubin: 0.7 mg/dL (ref 0.2–1.2)
Total Protein: 7.8 g/dL (ref 6.0–8.3)

## 2016-01-04 LAB — TSH: TSH: 4.3 u[IU]/mL (ref 0.35–4.50)

## 2016-01-04 LAB — HEMOGLOBIN A1C: HEMOGLOBIN A1C: 8.2 % — AB (ref 4.6–6.5)

## 2016-01-04 NOTE — Patient Instructions (Signed)
Labs today When you check your blood pressure in the am-check it several hours after you take your medicine See Dr Rockey Situ in May as planned  Take care of yourself  Follow up with me in 6 months with labs prior

## 2016-01-04 NOTE — Progress Notes (Signed)
Subjective:    Patient ID: Bianca Mueller, female    DOB: Jan 27, 1926, 82 y.o.   MRN: [id number]  HPI Here for f/u of chronic health problems   Saw Dr Rockey Situ 3/22- adn hydralazine was inc to 100 mg tid   (said she may need addn dose after lunch)  BP Readings from Last 3 Encounters:  01/04/16 144/76  12/08/15 172/86  11/08/15 160/88    bp at home vary a lot  Her am bp readings are at 7 am (takes her medicine at the same time)- pm blood pressures are better 518A/416S systolic for the most part  Last night 151/79 and did not sleep well    Needs re check of her thyroid Lab Results  Component Value Date   TSH 5.04* 11/08/2015   new hypothyroidism - likely from amiodarone  Started low dose levothyroxine - due for check today   Due for A1C Lab Results  Component Value Date   HGBA1C 7.4* 08/24/2015  160-170 usually for glucose readings  She does try to follow a diabetic diet  Eating the same and not loosing weight   Wt is stable with bmi of 21   No falls   Sees Dr Rockey Situ 5/24 No cp or cough or sob   Patient Active Problem List   Diagnosis Date Noted  . Hypokalemia 11/08/2015  . Anxiety 11/08/2015  . CAP (community acquired pneumonia) 11/08/2015  . Weakness 10/31/2015  . Urinary tract infectious disease   . Contusion   . Syncope 09/21/2015  . Bradycardia 09/21/2015  . Gait disorder 09/21/2015  . History of fall 08/24/2015  . Hearing loss 08/24/2015  . Chest wall contusion 08/24/2015  . Cough 02/26/2015  . Lightheaded 11/06/2014  . Leukocytosis 08/07/2014  . Acute on chronic diastolic heart failure ([place]) 08/07/2014  . Encounter for therapeutic drug monitoring 08/06/2014  . Chronic diastolic CHF (congestive heart failure) ([place])   . CAD (coronary artery disease)   . Type II diabetes mellitus ([place])   . Hyperlipidemia   . Acute on chronic diastolic CHF (congestive heart failure), NYHA class 3 ([place]) 07/27/2014  . Atrial fibrillation ([place])   . Nausea without  vomiting   . Transaminitis 07/20/2014  . Atrial flutter ([place]) 07/20/2014  . NSTEMI (non-ST elevated myocardial infarction) ([place]) 07/18/2014  . Dyslipidemia 07/18/2014  . Leg cramps 02/17/2014  . Encounter for Medicare annual wellness exam 08/19/2013  . Skin lesion 03/16/2013  . Other screening mammogram 05/17/2012  . Adult failure to thrive 05/17/2011  . Palpitations 02/16/2011  . DM2 (diabetes mellitus, type 2) ([place]) 02/14/2011  . ANEMIA, VITAMIN B12 DEFICIENCY 09/26/2010  . PEDAL EDEMA 03/16/2010  . [place] ALLERGY 09/06/2009  . Vitamin D deficiency 03/02/2009  . DIVERTICULITIS, COLON 11/12/2008  . COLONIC POLYPS, HX OF 09/02/2008  . HYPERCHOLESTEROLEMIA 12/10/2006  . Essential hypertension 12/10/2006  . Osteoporosis 03/18/2002   Past Medical History  Diagnosis Date  . Osteoporosis   . Hyperlipidemia   . Hypertension   . Hypercalcemia   . CAD (coronary artery disease)     a. 1989 s/p MI, TPA, PTCA;  b. 06/2014 NSTEMI/Cath: LM nl, LAD 30-40, D2 90, small, LCX 169m OM1 90, RCA Cs2+ 70-80p, 50-662m90d (? ruptured plaque), EF 55-60%-->Med Rx.  . Marland Kitchenoster     past  . Diverticulosis   . Hx of adenomatous colonic polyps 2010    last colonscopy 2010,Dr ElVira Agar no need for further  colonoscopy given age  . PAF/PAFlutter  a. 06/2014->amio and coumadin started.  . Elevated LFTs     a. 06/2014 Abd U/S: no GB pathology.  . Chronic diastolic CHF (congestive heart failure) ([place])     a. 07/2014 Echo: EF 60-65%, Gr 1 DD, mild to mod TR, PASP 60mHg.  . Myocardial infarction ([place] 1989  . Type II diabetes mellitus ([place]   . GERD (gastroesophageal reflux disease)   . Arthritis     "hips" (08/07/2014)   Past Surgical History  Procedure Laterality Date  . Vaginal hysterectomy  1970's?    ovaries intact  . Shoulder open rotator cuff repair Right 1980's  . Left heart catheterization with coronary angiogram N/A 07/22/2014    Procedure: LEFT HEART CATHETERIZATION WITH CORONARY  ANGIOGRAM;  Surgeon: HSinclair Grooms MD;  [facility];  Service: Cardiovascular;  Laterality: N/A;  . Cardiac catheterization  07/22/2014  . Coronary angioplasty  1989    "@ [facility]"   Social History  Substance Use Topics  . Smoking status: Former Smoker -- 0.25 packs/day for 30 years    Types: Cigarettes  . Smokeless tobacco: Never Used     Comment: "quit smoking in the 1980's"  . Alcohol Use: No   Family History  Problem Relation Age of [place]set  . Heart disease Mother     CAD  . Heart disease Father     CAD  . Heart disease Brother     CAD   Allergies  Allergen Reactions  . Alendronate Sodium Other (See Comments)    unknown  . Doxycycline Other (See Comments)    Inc LFT  . Isosorbide Nitrate Other (See Comments)  . Lidocaine Nausea Only    Given at Dentist office  . Metformin And Related     Decreases appetite resulting in weight loss  . Metronidazole Nausea And Vomiting    REACTION: sick/ GI  . Nifedipine Other (See Comments)   Current Outpatient Prescriptions on File Prior to Visit  Medication Sig Dispense Refill  . ACCU-CHEK FASTCLIX LANCETS MISC Use as instructed to test blood sugar once daily or as needed.  Diagnosis:  E11.9   Non insulin-dependent. 102 each 4  . amiodarone (PACERONE) 200 MG tablet Take 1 tablet (200 mg total) by mouth daily. 180 tablet 3  . Blood Glucose Monitoring Suppl (ACCU-CHEK AVIVA PLUS) W/DEVICE KIT Use as instructed to test blood sugar once daily or as needed.  Diagnosis:  E11.9  Non insulin-dependent. 1 kit 0  . busPIRone (BUSPAR) 15 MG tablet Take 0.5 tablets (7.5 mg total) by mouth 2 (two) times daily. 90 tablet 3  . cholecalciferol (VITAMIN D) 1000 UNITS tablet Take 2,000 Units by mouth daily at 12 noon.     . cloNIDine (CATAPRES) 0.2 MG tablet Take 1 tablet (0.2 mg total) by mouth 2 (two) times daily. 180 tablet 3  . docusate sodium 100 MG CAPS Take 100 mg by mouth 2 (two) times daily. 10 capsule 0  . doxazosin (CARDURA) 4 MG  tablet Take 1 tablet (4 mg total) by mouth 2 (two) times daily. 60 tablet 6  . furosemide (LASIX) 40 MG tablet Take 1 tablet (40 mg total) by mouth daily. As directed by cardiologist, Pt may take additional doses as needed 180 tablet 2  . glucose blood test strip Use as instructed to test blood sugar once daily or as needed.  Diagnosis:  E11.9   Non insulin-dependent.    . hydrALAZINE (APRESOLINE) 100 MG tablet Take 1 tablet (100 mg  total) by mouth 3 (three) times daily. 90 tablet 6  . levothyroxine (LEVOTHROID) 25 MCG tablet Take 1 tablet (25 mcg total) by mouth daily before breakfast. 30 tablet 3  . lisinopril (PRINIVIL,ZESTRIL) 20 MG tablet TAKE 1 TABLET EVERY DAY AT NOON 90 tablet 2  . metoprolol tartrate (LOPRESSOR) 25 MG tablet Take 0.5 tablets (12.5 mg total) by mouth 2 (two) times daily. 180 tablet 3  . nitroGLYCERIN (NITROSTAT) 0.4 MG SL tablet Place 1 tablet (0.4 mg total) under the tongue every 5 (five) minutes as needed. For chest pain and if not improved seek medical attention in ER. 15 tablet 0  . pravastatin (PRAVACHOL) 20 MG tablet Take 1 tablet (20 mg total) by mouth daily. 90 tablet 3  . sitaGLIPtin (JANUVIA) 100 MG tablet Take 1 tablet (100 mg total) by mouth daily. 90 tablet 2  . vitamin B-12 (CYANOCOBALAMIN) 1000 MCG tablet Take 1,000 mcg by mouth daily.    Marland Kitchen warfarin (COUMADIN) 3 MG tablet Take 0.5-1 tablets (1.5-3 mg total) by mouth daily. Take as directed by anti-coagulation clinic TAKE ONE-HALF OF A TABLET (1.5 MG) ON SUNDAYS,TUESDAYS,THURSDAYS AND SATURDAYS. TAKE 1 TABLET (3 MG) ON ALL OTHER DAYS 70 tablet 0   No current facility-administered medications on file prior to visit.      Review of Systems Review of Systems  Constitutional: Negative for fever, appetite change,  and unexpected weight change. pos for fatigue  Eyes: Negative for pain and visual disturbance.  Respiratory: Negative for cough and pos for improved exertional shortness of breath.   Cardiovascular:  Negative for cp or palpitations   neg for pedal edema  Gastrointestinal: Negative for nausea, diarrhea and constipation.  Genitourinary: Negative for urgency and frequency.  Skin: Negative for pallor or rash   Neurological: Negative for weakness, light-headedness, numbness and headaches.  Hematological: Negative for adenopathy. Does not bruise/bleed easily.  Psychiatric/Behavioral: Negative for dysphoric mood. The patient is occ nervous/anxious.         Objective:   Physical Exam  Constitutional: She appears well-developed and well-nourished. No distress.  Frail appearing elderly female   HENT:  Head: Normocephalic and atraumatic.  Mouth/Throat: Oropharynx is clear and moist.  Eyes: Conjunctivae and EOM are normal. Pupils are equal, round, and reactive to light.  Neck: Normal range of motion. Neck supple. No JVD present. Carotid bruit is not present. No thyromegaly present.  Cardiovascular: Normal rate, normal heart sounds and intact distal pulses.  Exam reveals no gallop.   Pulse of 57  Pulmonary/Chest: Effort normal and breath sounds normal. No respiratory distress. She has no wheezes. She has no rales.  No crackles  Abdominal: Soft. Bowel sounds are normal. She exhibits no distension, no abdominal bruit and no mass. There is no tenderness.  Musculoskeletal: She exhibits no edema.  Lymphadenopathy:    She has no cervical adenopathy.  Neurological: She is alert. She has normal reflexes.  Skin: Skin is warm and dry. No rash noted.  Psychiatric: She has a normal mood and affect.          Assessment & Plan:   Problem List Items Addressed This Visit      Cardiovascular and Mediastinum   Essential hypertension - Primary    bp in fair control at this time  BP Readings from Last 1 Encounters:  01/04/16 144/76   No changes needed Disc lifstyle change with low sodium diet and exercise  Adv pt to check her bp after she takes her medication- overall imp after  inc her  hydralazine For cardiol f/u next mo  Overall doing better with CHF as well       Relevant Orders   Comprehensive metabolic panel (Completed)   TSH (Completed)   Lipid panel (Completed)     Endocrine   DM2 (diabetes mellitus, type 2) ([place])    Per pt glucose is worse lately - diet is unchanged  Activity level may also be less No symptoms  A1C today and adv  Unfortunately- intol of metformin  Tolerating Januvia       Relevant Orders   Hemoglobin A1c (Completed)     Other   Transaminitis    Labs today on smaller dose of pravastatin  No abd/gi c/o  Rev last [place] and ct       Relevant Orders   Comprehensive metabolic panel (Completed)

## 2016-01-04 NOTE — Progress Notes (Signed)
Pre visit review using our clinic review tool, if applicable. No additional management support is needed unless otherwise documented below in the visit note. 

## 2016-01-06 ENCOUNTER — Telehealth: Payer: Self-pay | Admitting: *Deleted

## 2016-01-06 ENCOUNTER — Other Ambulatory Visit: Payer: Self-pay | Admitting: *Deleted

## 2016-01-06 MED ORDER — HYDRALAZINE HCL 100 MG PO TABS: 100.0000 mg | ORAL_TABLET | Freq: Three times a day (TID) | ORAL | Status: DC

## 2016-01-06 NOTE — Assessment & Plan Note (Signed)
Labs today on smaller dose of pravastatin  No abd/gi c/o  Rev last [place] and ct

## 2016-01-06 NOTE — Assessment & Plan Note (Signed)
bp in fair control at this time  BP Readings from Last 1 Encounters:  01/04/16 144/76   No changes needed Disc lifstyle change with low sodium diet and exercise  Adv pt to check her bp after she takes her medication- overall imp after inc her hydralazine For cardiol f/u next mo  Overall doing better with CHF as well

## 2016-01-06 NOTE — Assessment & Plan Note (Signed)
Per pt glucose is worse lately - diet is unchanged  Activity level may also be less No symptoms  A1C today and adv  Unfortunately- intol of metformin  Tolerating Januvia

## 2016-01-06 NOTE — Telephone Encounter (Signed)
ERROR

## 2016-01-10 ENCOUNTER — Telehealth: Payer: Self-pay | Admitting: Family Medicine

## 2016-01-10 ENCOUNTER — Encounter: Payer: Self-pay | Admitting: Cardiovascular Disease

## 2016-01-10 ENCOUNTER — Encounter: Payer: Self-pay | Admitting: Family Medicine

## 2016-01-10 DIAGNOSIS — E1159 Type 2 diabetes mellitus with other circulatory complications: Secondary | ICD-10-CM

## 2016-01-10 DIAGNOSIS — R7401 Elevation of levels of liver transaminase levels: Secondary | ICD-10-CM

## 2016-01-10 DIAGNOSIS — R740 Nonspecific elevation of levels of transaminase and lactic acid dehydrogenase [LDH]: Secondary | ICD-10-CM

## 2016-01-10 NOTE — Telephone Encounter (Signed)
Orders for abd [place] and also for endocrinology ref  Will route to [facility]

## 2016-01-12 ENCOUNTER — Other Ambulatory Visit: Payer: Self-pay | Admitting: Family Medicine

## 2016-01-12 NOTE — Telephone Encounter (Signed)
Rout to [facility] 

## 2016-01-13 ENCOUNTER — Ambulatory Visit (INDEPENDENT_AMBULATORY_CARE_PROVIDER_SITE_OTHER): Payer: Commercial Managed Care - HMO | Admitting: *Deleted

## 2016-01-13 ENCOUNTER — Encounter: Payer: Self-pay | Admitting: *Deleted

## 2016-01-13 DIAGNOSIS — Z5181 Encounter for therapeutic drug level monitoring: Secondary | ICD-10-CM

## 2016-01-13 DIAGNOSIS — I4891 Unspecified atrial fibrillation: Secondary | ICD-10-CM | POA: Diagnosis not present

## 2016-01-13 DIAGNOSIS — I4892 Unspecified atrial flutter: Secondary | ICD-10-CM

## 2016-01-13 LAB — POCT INR: INR: 1.9

## 2016-01-13 NOTE — Progress Notes (Signed)
INR remains sub therapeutic despite recent boost.  Patient denies any missed doses, new medications, or diet changes.  Will increase dose - patient instructed to begin taking one tablet daily and half a tablet on Mondays, Wednesdays, and Fridays.  Encouraged her to avoid greens for the next 2-3 days.  Will recheck in 4 weeks and make additional adjustments at that time if needed.

## 2016-01-13 NOTE — Progress Notes (Signed)
Pre visit review using our clinic review tool, if applicable. No additional management support is needed unless otherwise documented below in the visit note. 

## 2016-01-18 ENCOUNTER — Ambulatory Visit
Admission: RE | Admit: 2016-01-18 | Discharge: 2016-01-18 | Disposition: A | Discharge status: Home / Self Care | Payer: Commercial Managed Care - HMO | Source: Ambulatory Visit | Attending: Family Medicine | Admitting: Family Medicine

## 2016-01-18 DIAGNOSIS — R7401 Elevation of levels of liver transaminase levels: Secondary | ICD-10-CM

## 2016-01-18 DIAGNOSIS — R740 Nonspecific elevation of levels of transaminase and lactic acid dehydrogenase [LDH]: Secondary | ICD-10-CM | POA: Diagnosis not present

## 2016-01-20 ENCOUNTER — Encounter (INDEPENDENT_AMBULATORY_CARE_PROVIDER_SITE_OTHER): Payer: Commercial Managed Care - HMO

## 2016-01-20 DIAGNOSIS — R55 Syncope and collapse: Secondary | ICD-10-CM | POA: Diagnosis not present

## 2016-01-21 DIAGNOSIS — Z8679 Personal history of other diseases of the circulatory system: Secondary | ICD-10-CM | POA: Diagnosis not present

## 2016-01-21 DIAGNOSIS — E1165 Type 2 diabetes mellitus with hyperglycemia: Secondary | ICD-10-CM | POA: Diagnosis not present

## 2016-01-21 DIAGNOSIS — I2510 Atherosclerotic heart disease of native coronary artery without angina pectoris: Secondary | ICD-10-CM | POA: Diagnosis not present

## 2016-01-24 ENCOUNTER — Ambulatory Visit (INDEPENDENT_AMBULATORY_CARE_PROVIDER_SITE_OTHER): Payer: Commercial Managed Care - HMO | Admitting: Family Medicine

## 2016-01-24 ENCOUNTER — Encounter: Payer: Self-pay | Admitting: Family Medicine

## 2016-01-24 VITALS — BP 170/80 | HR 71 | Temp 98.2°F | Wt 116.5 lb

## 2016-01-24 DIAGNOSIS — N3000 Acute cystitis without hematuria: Secondary | ICD-10-CM

## 2016-01-24 DIAGNOSIS — Z87440 Personal history of urinary (tract) infections: Secondary | ICD-10-CM

## 2016-01-24 DIAGNOSIS — N390 Urinary tract infection, site not specified: Secondary | ICD-10-CM | POA: Diagnosis not present

## 2016-01-24 LAB — POC URINALSYSI DIPSTICK (AUTOMATED)
Bilirubin, UA: NEGATIVE
Blood, UA: NEGATIVE
Glucose, UA: NEGATIVE
KETONES UA: NEGATIVE
Nitrite, UA: NEGATIVE
PH UA: 6.5
PROTEIN UA: NEGATIVE
SPEC GRAV UA: 1.02
Urobilinogen, UA: 0.2

## 2016-01-24 MED ORDER — SULFAMETHOXAZOLE-TRIMETHOPRIM 400-80 MG PO TABS: 1.0000 | ORAL_TABLET | Freq: Two times a day (BID) | ORAL | Status: DC

## 2016-01-24 NOTE — Assessment & Plan Note (Signed)
Nontoxic, presumed dx.  Will have her drop off ucx sample- she couldn't void at the [facility].   She doesn't have focal source but with her hx would presume UTI.  Cut coumadin back to 1/2 tab while on septra, patient agrees.  Start abx after ucx collection.  She'll update [place] as needed.   Okay for outpatient f/u.  Routed to PCP as FYI.

## 2016-01-24 NOTE — Addendum Note (Signed)
Addended by: Josetta Huddle on: 01/24/2016 02:46 PM   Modules accepted: Medications

## 2016-01-24 NOTE — Patient Instructions (Signed)
Presumed urinary tract infection.  Get a urine sample collected and drop off.   Then start the antibiotics.  While on antibiotics, cut the coumadin down to a 1/2 tab a day.  Take care.  Glad to see you.  Update [place] as needed.

## 2016-01-24 NOTE — Addendum Note (Signed)
Addended by: Josetta Huddle on: 01/24/2016 02:23 PM   Modules accepted: Orders

## 2016-01-24 NOTE — Addendum Note (Signed)
Addended by: Ellamae Sia on: 01/24/2016 05:21 PM   Modules accepted: Orders

## 2016-01-24 NOTE — Progress Notes (Signed)
Pre visit review using our clinic review tool, if applicable. No additional management support is needed unless otherwise documented below in the visit note.  Sx started last night, after went to bed.  She didn't sleep most of the night.  Had chills, presumed fever.  No one else is sick at home.  No urinary sx.  Some cough, no sputum.  She is a little SOB with stooping over, but that has been going on "for years" and not worse recently.  A little BLE edema at baseline.  She doesn't feel sick but still feels jittery at the OV.  No vomiting, no diarrhea.  No ST.  No ear pain.  Has rhinorrhea at baseline.    Still on baseline DM2, HTN meds.  Also still on coumadin.  3mg  daily except 1.5mg  on Mondays, Wednesdays, and Fridays.  Per daughter patient was complaining of chills all weekend, 2-3 days.   H/o UTI noted, and patient didn't have UTI sx initially with that episode.    Meds, vitals, and allergies reviewed.   ROS: Per HPI unless specifically indicated in ROS section   nad ncat Mmm Neck supple, no LA rrr Ctab, no wheeze, no rales abd soft, not ttp No cva pain Ext with trace BLE edema.  External heart monitor in place at baseline.

## 2016-01-26 LAB — URINE CULTURE

## 2016-02-03 ENCOUNTER — Other Ambulatory Visit: Payer: Self-pay | Admitting: *Deleted

## 2016-02-03 MED ORDER — LEVOTHYROXINE SODIUM 25 MCG PO TABS: 25.0000 ug | ORAL_TABLET | Freq: Every day | ORAL | Status: DC

## 2016-02-09 ENCOUNTER — Encounter: Payer: Self-pay | Admitting: Cardiovascular Disease

## 2016-02-09 ENCOUNTER — Ambulatory Visit (INDEPENDENT_AMBULATORY_CARE_PROVIDER_SITE_OTHER): Payer: Commercial Managed Care - HMO | Admitting: Cardiovascular Disease

## 2016-02-09 VITALS — BP 140/70 | HR 60 | Ht 61.0 in | Wt 116.8 lb

## 2016-02-09 DIAGNOSIS — I2510 Atherosclerotic heart disease of native coronary artery without angina pectoris: Secondary | ICD-10-CM

## 2016-02-09 DIAGNOSIS — I481 Persistent atrial fibrillation: Secondary | ICD-10-CM | POA: Diagnosis not present

## 2016-02-09 DIAGNOSIS — I4819 Other persistent atrial fibrillation: Secondary | ICD-10-CM

## 2016-02-09 DIAGNOSIS — R55 Syncope and collapse: Secondary | ICD-10-CM

## 2016-02-09 DIAGNOSIS — I10 Essential (primary) hypertension: Secondary | ICD-10-CM

## 2016-02-09 DIAGNOSIS — I5033 Acute on chronic diastolic (congestive) heart failure: Secondary | ICD-10-CM

## 2016-02-09 DIAGNOSIS — I2583 Coronary atherosclerosis due to lipid rich plaque: Secondary | ICD-10-CM

## 2016-02-09 DIAGNOSIS — I5032 Chronic diastolic (congestive) heart failure: Secondary | ICD-10-CM

## 2016-02-09 NOTE — Patient Instructions (Addendum)
You are doing fantastic! No medication changes were made.  If ankle swelling gets much worse, Take extra laisx, Put the feet up  Please call us if you have new issues that need to be addressed before your next appt.  Your physician wants you to follow-up in: 6 months.  You will receive a reminder letter in the mail two months in advance. If you don't receive a letter, please call our office to schedule the follow-up appointment.

## 2016-02-09 NOTE — Assessment & Plan Note (Signed)
Blood pressure is well controlled on today's visit. No changes made to the medications. 

## 2016-02-09 NOTE — Progress Notes (Signed)
Patient ID: Bianca Mueller, female    DOB: [birth year], 82 y.o.   MRN: [id number]  HPI Comments: 82 year old female with history of non-ST elevation MI complicated by paroxysmal atrial fibrillation and flutter who presents for follow-up of her coronary artery disease and atrial flutter. Prior non-ST elevation MI October 2015 History of labile hypertension On a prior clinic visit, she reported having episode of syncope. 30 day monitor was ordered, she declined  In follow-up today, she presents alone We did receive phone calls requesting the 30 day monitor and she has been wearing the monitor for 3 weeks approximately Download of the monitor shows no significant arrhythmia, only normal sinus rhythm  Overall she feels well, blood pressure has been well-controlled on her current medication regiment She has numbers from home showing systolic pressures typically 130s Pressure 140 today She denies any symptoms of lightheadedness or dizziness Walks with a cane, no recent falls She does report some mild ankle swelling, takes Lasix  Currently takes clonidine 0.2 mg twice a day, hydralazine 100 mg twice a day, Lasix, lisinopril 20 mg daily, metoprolol 12.5 mg daily  Other past medical history She did have a fall, fell between the bed and dresser, hurt her head Feels it was from her balance  Previous fall as she was getting out of her car in the garage Feels her feet slipped, did not have good balance, went down and hurt her ribs Denies any loss of consciousness, near syncope or dizziness  no further falls since that time Reports that her balance is okay, does not need a walker  hemoglobin A1c 7.4, total cholesterol 150  In the hospital she had atrial fibrillation/flutter with RVR, started on diltiazem and amiodarone  cardiac catheterization showing severe diagonal, circumflex OM1, and RCA disease, medical management recommended Started on Coumadin and Plavix  follow-up in clinic she was  still in atrial flutter with rate 86 bpm, normal sinus rhythm on her last clinic visit  Previous 2-D echocardiogram showed normal LV function.   Allergies  Allergen Reactions  . Alendronate Sodium Other (See Comments)    unknown  . Doxycycline Other (See Comments)    Inc LFT  . Glipizide     Chills   . Isosorbide Nitrate Other (See Comments)  . Lidocaine Nausea Only    Given at Dentist office  . Metformin And Related     Decreases appetite resulting in weight loss  . Metronidazole Nausea And Vomiting    REACTION: sick/ GI  . Nifedipine Other (See Comments)    Outpatient Encounter Prescriptions as of 02/09/2016  Medication Sig  . ACCU-CHEK FASTCLIX LANCETS MISC Use as instructed to test blood sugar once daily or as needed.  Diagnosis:  E11.9   Non insulin-dependent.  Marland Kitchen amiodarone (PACERONE) 200 MG tablet Take 1 tablet (200 mg total) by mouth daily.  . Blood Glucose Monitoring Suppl (ACCU-CHEK AVIVA PLUS) W/DEVICE KIT Use as instructed to test blood sugar once daily or as needed.  Diagnosis:  E11.9  Non insulin-dependent.  . busPIRone (BUSPAR) 15 MG tablet Take 0.5 tablets (7.5 mg total) by mouth 2 (two) times daily.  . cholecalciferol (VITAMIN D) 1000 UNITS tablet Take 2,000 Units by mouth daily at 12 noon.   . cloNIDine (CATAPRES) 0.2 MG tablet Take 1 tablet (0.2 mg total) by mouth 2 (two) times daily.  Marland Kitchen docusate sodium 100 MG CAPS Take 100 mg by mouth 2 (two) times daily.  Marland Kitchen doxazosin (CARDURA) 4 MG tablet Take 1 tablet (  4 mg total) by mouth 2 (two) times daily.  . furosemide (LASIX) 40 MG tablet Take 1 tablet (40 mg total) by mouth daily. As directed by cardiologist, Pt may take additional doses as needed (Patient taking differently: Take 40 mg by mouth 2 (two) times daily as needed. As directed by cardiologist, Pt may take additional doses as needed)  . glucose blood test strip Use as instructed to test blood sugar once daily or as needed.  Diagnosis:  E11.9   Non  insulin-dependent.  . hydrALAZINE (APRESOLINE) 100 MG tablet Take 1 tablet (100 mg total) by mouth 3 (three) times daily.  Marland Kitchen levothyroxine (LEVOTHROID) 25 MCG tablet Take 1 tablet (25 mcg total) by mouth daily before breakfast.  . lisinopril (PRINIVIL,ZESTRIL) 20 MG tablet TAKE 1 TABLET EVERY DAY AT NOON  . metoprolol tartrate (LOPRESSOR) 25 MG tablet Take 0.5 tablets (12.5 mg total) by mouth 2 (two) times daily.  . nitroGLYCERIN (NITROSTAT) 0.4 MG SL tablet Place 1 tablet (0.4 mg total) under the tongue every 5 (five) minutes as needed. For chest pain and if not improved seek medical attention in ER.  Marland Kitchen omeprazole (PRILOSEC) 20 MG capsule Take 20 mg by mouth daily.  . pravastatin (PRAVACHOL) 20 MG tablet Take 1 tablet (20 mg total) by mouth daily.  . sitaGLIPtin (JANUVIA) 100 MG tablet Take 1 tablet (100 mg total) by mouth daily.  . vitamin B-12 (CYANOCOBALAMIN) 1000 MCG tablet Take 1,000 mcg by mouth daily.  Marland Kitchen warfarin (COUMADIN) 3 MG tablet Take as directed by anti-coagulation clinic.  . [DISCONTINUED] glipiZIDE (GLUCOTROL) 5 MG tablet Reported on 02/09/2016  . [DISCONTINUED] sulfamethoxazole-trimethoprim (BACTRIM) 400-80 MG tablet Take 1 tablet by mouth 2 (two) times daily. (Patient not taking: Reported on 02/09/2016)   No facility-administered encounter medications on file as of 02/09/2016.    Past Medical History  Diagnosis Date  . Osteoporosis   . Hyperlipidemia   . Hypertension   . Hypercalcemia   . CAD (coronary artery disease)     a. 1989 s/p MI, TPA, PTCA;  b. 06/2014 NSTEMI/Cath: LM nl, LAD 30-40, D2 90, small, LCX 165m OM1 90, RCA Cs2+ 70-80p, 50-677m90d (? ruptured plaque), EF 55-60%-->Med Rx.  . Marland Kitchenoster     past  . Diverticulosis   . Hx of adenomatous colonic polyps 2010    last colonscopy 2010,Dr ElVira Agar no need for further  colonoscopy given age  . PAF/PAFlutter     a. 06/2014->amio and coumadin started.  . Elevated LFTs     a. 06/2014 Abd U/S: no GB pathology.  .  [place]    a. 07/2014 Echo: EF 60-65%, Gr 1 DD, mild to mod TR, PASP 3738m.  . [place]  . Type II diabetes mellitus ([place] . GERD (gastroesophageal reflux disease)   . Arthritis     "hips" (08/07/2014)    Past Surgical History  Procedure Laterality Date  . Vaginal hysterectomy  1970's?    ovaries intact  . Shoulder open rotator cuff repair Right 1980's  . Left heart catheterization with coronary angiogram N/A 07/22/2014    Procedure: LEFT HEART CATHETERIZATION WITH CORONARY ANGIOGRAM;  Surgeon: HenSinclair GroomsD;  [facility];  Service: Cardiovascular;  Laterality: N/A;  . Cardiac catheterization  07/22/2014  . Coronary angioplasty  1989    "@ [facility]"    Social History  reports that she has quit smoking. Her smoking use included  Cigarettes. She has a 7.5 pack-year smoking history. She has never used smokeless tobacco. She reports that she does not drink alcohol or use illicit drugs.  Family History family history includes Heart disease in her brother, father, and mother.   Review of Systems  Constitutional: Negative.   Respiratory: Negative.   Cardiovascular: Negative.   Gastrointestinal: Negative.   Musculoskeletal: Negative.   Neurological: Negative.   Hematological: Negative.   Psychiatric/Behavioral: Negative.   All other systems reviewed and are negative.  BP 140/70 mmHg  Pulse 60  Ht _0  (1.549 m)  Wt 116 lb 12 oz (52.957 kg)  BMI 22.07 kg/m2   Physical Exam  Constitutional: She is oriented to person, place, and time.  Thin appearing  HENT:  Head: Normocephalic.  Nose: Nose normal.  Mouth/Throat: Oropharynx is clear and moist.  Eyes: Conjunctivae are normal. Pupils are equal, round, and reactive to light.  Neck: Normal range of motion. Neck supple. No JVD present.  Cardiovascular: Normal rate, regular rhythm, S1 normal, S2 normal, normal heart sounds and intact distal  pulses.  Exam reveals no gallop and no friction rub.   No murmur heard. Pulmonary/Chest: Effort normal and breath sounds normal. No respiratory distress. She has no wheezes. She has no rales. She exhibits no tenderness.  Abdominal: Soft. Bowel sounds are normal. She exhibits no distension. There is no tenderness.  Musculoskeletal: Normal range of motion. She exhibits no edema or tenderness.  Lymphadenopathy:    She has no cervical adenopathy.  Neurological: She is alert and oriented to person, place, and time. Coordination normal.  Skin: Skin is warm and dry. No rash noted. No erythema.  Psychiatric: She has a normal mood and affect. Her behavior is normal. Judgment and thought content normal.    Assessment and Plan  Nursing note and vitals reviewed.

## 2016-02-09 NOTE — Assessment & Plan Note (Signed)
Trace ankle edema Unclear how much Lasix she is actually taking as daughter is not with her, suspect Lasix daily with extra dose as needed for ankle swelling

## 2016-02-09 NOTE — Assessment & Plan Note (Signed)
Currently with no symptoms of angina. No further workup at this time. Continue current medication regimen. 

## 2016-02-09 NOTE — Assessment & Plan Note (Signed)
No recent episodes of near syncope or syncope. 30 day monitor in place showing no significant arrhythmia 1 more week of monitor. No further intervention at this time

## 2016-02-09 NOTE — Assessment & Plan Note (Signed)
Maintaining normal sinus rhythm on monitor We'll continue current medications including low-dose amiodarone Tolerating anticoagulation

## 2016-02-10 ENCOUNTER — Ambulatory Visit (INDEPENDENT_AMBULATORY_CARE_PROVIDER_SITE_OTHER): Payer: Commercial Managed Care - HMO | Admitting: *Deleted

## 2016-02-10 DIAGNOSIS — I4891 Unspecified atrial fibrillation: Secondary | ICD-10-CM | POA: Diagnosis not present

## 2016-02-10 DIAGNOSIS — I4892 Unspecified atrial flutter: Secondary | ICD-10-CM

## 2016-02-10 DIAGNOSIS — Z5181 Encounter for therapeutic drug level monitoring: Secondary | ICD-10-CM

## 2016-02-10 LAB — POCT INR: INR: 1.4

## 2016-02-10 NOTE — Progress Notes (Signed)
Pre visit review using our clinic review tool, if applicable. No additional management support is needed unless otherwise documented below in the visit note.  INR continues to decrease despite recent increase in dose.  Patient denies any missed doses, new medications, or diet changes.  Patient instructed to begin taking one tablet daily and a half a tablet on Wednesdays only.  Encouraged her to remain consistent with diet.  Will recheck in 4 weeks and make additional adjustments at that time if needed.

## 2016-02-15 ENCOUNTER — Other Ambulatory Visit: Payer: Self-pay | Admitting: *Deleted

## 2016-02-15 MED ORDER — DOXAZOSIN MESYLATE 4 MG PO TABS: 4.0000 mg | ORAL_TABLET | Freq: Two times a day (BID) | ORAL | Status: DC

## 2016-02-21 ENCOUNTER — Other Ambulatory Visit: Payer: Self-pay

## 2016-02-21 DIAGNOSIS — R55 Syncope and collapse: Secondary | ICD-10-CM

## 2016-02-24 ENCOUNTER — Other Ambulatory Visit: Payer: Commercial Managed Care - HMO

## 2016-03-09 ENCOUNTER — Other Ambulatory Visit (INDEPENDENT_AMBULATORY_CARE_PROVIDER_SITE_OTHER): Payer: Commercial Managed Care - HMO

## 2016-03-09 DIAGNOSIS — I4891 Unspecified atrial fibrillation: Secondary | ICD-10-CM | POA: Diagnosis not present

## 2016-03-09 DIAGNOSIS — I4892 Unspecified atrial flutter: Secondary | ICD-10-CM

## 2016-03-09 DIAGNOSIS — Z5181 Encounter for therapeutic drug level monitoring: Secondary | ICD-10-CM

## 2016-03-09 LAB — POCT INR: INR: 2.7

## 2016-03-13 ENCOUNTER — Other Ambulatory Visit: Payer: Self-pay | Admitting: *Deleted

## 2016-03-13 MED ORDER — WARFARIN SODIUM 3 MG PO TABS: ORAL_TABLET | ORAL | Status: DC

## 2016-03-13 NOTE — Telephone Encounter (Signed)
Fax refill request, last filled on 01/12/16 #70 with 0 refills, please advise

## 2016-03-13 NOTE — Telephone Encounter (Signed)
done

## 2016-03-13 NOTE — Telephone Encounter (Signed)
Please refill times 3 

## 2016-03-16 ENCOUNTER — Other Ambulatory Visit: Payer: Self-pay | Admitting: Family Medicine

## 2016-04-06 ENCOUNTER — Other Ambulatory Visit (INDEPENDENT_AMBULATORY_CARE_PROVIDER_SITE_OTHER): Payer: Commercial Managed Care - HMO

## 2016-04-06 DIAGNOSIS — I4891 Unspecified atrial fibrillation: Secondary | ICD-10-CM | POA: Diagnosis not present

## 2016-04-06 DIAGNOSIS — I4892 Unspecified atrial flutter: Secondary | ICD-10-CM

## 2016-04-06 DIAGNOSIS — Z5181 Encounter for therapeutic drug level monitoring: Secondary | ICD-10-CM | POA: Diagnosis not present

## 2016-04-06 LAB — POCT INR: INR: 2.8

## 2016-04-11 ENCOUNTER — Telehealth: Payer: Self-pay | Admitting: *Deleted

## 2016-04-11 NOTE — Telephone Encounter (Signed)
L[place]ene notified of Dr. Marliss Coots comments

## 2016-04-11 NOTE — Telephone Encounter (Signed)
Patient was instructed to repeat INR in 4 weeks.  Patient's husband usually comes in also for INR and he has been scheduled for 6 weeks follow up.  Can she move safely to 6 weeks?  If not, patient will make appt accordingly.

## 2016-04-11 NOTE — Telephone Encounter (Signed)
If no bruising or bleeding I think that would be fine

## 2016-04-12 ENCOUNTER — Encounter: Payer: Self-pay | Admitting: Family Medicine

## 2016-04-14 ENCOUNTER — Emergency Department: Payer: Commercial Managed Care - HMO

## 2016-04-14 ENCOUNTER — Emergency Department
Admission: EM | Admit: 2016-04-14 | Discharge: 2016-04-14 | Disposition: A | Discharge status: Home / Self Care | Payer: Commercial Managed Care - HMO | Attending: Student | Admitting: Student

## 2016-04-14 ENCOUNTER — Telehealth: Payer: Self-pay | Admitting: Cardiovascular Disease

## 2016-04-14 ENCOUNTER — Telehealth: Payer: Self-pay | Admitting: *Deleted

## 2016-04-14 ENCOUNTER — Ambulatory Visit: Payer: Commercial Managed Care - HMO | Admitting: Physician Assistant

## 2016-04-14 ENCOUNTER — Encounter: Payer: Self-pay | Admitting: Radiology

## 2016-04-14 DIAGNOSIS — E119 Type 2 diabetes mellitus without complications: Secondary | ICD-10-CM | POA: Insufficient documentation

## 2016-04-14 DIAGNOSIS — Z87891 Personal history of nicotine dependence: Secondary | ICD-10-CM | POA: Insufficient documentation

## 2016-04-14 DIAGNOSIS — I110 Hypertensive heart disease with heart failure: Secondary | ICD-10-CM | POA: Diagnosis not present

## 2016-04-14 DIAGNOSIS — R9431 Abnormal electrocardiogram [ECG] [EKG]: Secondary | ICD-10-CM | POA: Diagnosis not present

## 2016-04-14 DIAGNOSIS — I5032 Chronic diastolic (congestive) heart failure: Secondary | ICD-10-CM | POA: Diagnosis not present

## 2016-04-14 DIAGNOSIS — M546 Pain in thoracic spine: Secondary | ICD-10-CM

## 2016-04-14 DIAGNOSIS — Z7901 Long term (current) use of anticoagulants: Secondary | ICD-10-CM | POA: Diagnosis not present

## 2016-04-14 DIAGNOSIS — I2510 Atherosclerotic heart disease of native coronary artery without angina pectoris: Secondary | ICD-10-CM | POA: Insufficient documentation

## 2016-04-14 DIAGNOSIS — R079 Chest pain, unspecified: Secondary | ICD-10-CM | POA: Diagnosis not present

## 2016-04-14 LAB — URINALYSIS COMPLETE WITH MICROSCOPIC (ARMC ONLY)
BILIRUBIN URINE: NEGATIVE
HGB URINE DIPSTICK: NEGATIVE
Ketones, ur: NEGATIVE mg/dL
LEUKOCYTES UA: NEGATIVE
NITRITE: NEGATIVE
Protein, ur: NEGATIVE mg/dL
SPECIFIC GRAVITY, URINE: 1.008 (ref 1.005–1.030)
pH: 7 (ref 5.0–8.0)

## 2016-04-14 LAB — BASIC METABOLIC PANEL
ANION GAP: 14 (ref 5–15)
BUN: 14 mg/dL (ref 6–20)
CHLORIDE: 95 mmol/L — AB (ref 101–111)
CO2: 24 mmol/L (ref 22–32)
Calcium: 10 mg/dL (ref 8.9–10.3)
Creatinine, Ser: 0.88 mg/dL (ref 0.44–1.00)
GFR calc Af Amer: >60 mL/min (ref >60)
GFR, EST NON AFRICAN AMERICAN: 57 mL/min — AB (ref >60)
GLUCOSE: 185 mg/dL — AB (ref 65–99)
POTASSIUM: 3.8 mmol/L (ref 3.5–5.1)
Sodium: 133 mmol/L — ABNORMAL LOW (ref 135–145)

## 2016-04-14 LAB — TROPONIN I: Troponin I: <0.03 ng/mL (ref <0.03)

## 2016-04-14 LAB — CBC
HEMATOCRIT: 46.6 % (ref 35.0–47.0)
HEMOGLOBIN: 15.5 g/dL (ref 12.0–16.0)
MCH: 29.2 pg (ref 26.0–34.0)
MCHC: 33.2 g/dL (ref 32.0–36.0)
MCV: 87.9 fL (ref 80.0–100.0)
Platelets: 306 10*3/uL (ref 150–440)
RBC: 5.3 MIL/uL — ABNORMAL HIGH (ref 3.80–5.20)
RDW: 14.8 % — ABNORMAL HIGH (ref 11.5–14.5)
WBC: 9.5 10*3/uL (ref 3.6–11.0)

## 2016-04-14 LAB — PROTIME-INR
INR: 1.97
PROTHROMBIN TIME: 22.7 s — AB (ref 11.4–15.2)

## 2016-04-14 LAB — APTT: aPTT: 54 seconds — ABNORMAL HIGH (ref 24–36)

## 2016-04-14 MED ORDER — IOPAMIDOL (ISOVUE-370) INJECTION 76%
75.0000 mL | Freq: Once | INTRAVENOUS | Status: AC | PRN
Start: 2016-04-14 — End: 2016-04-14
  Administered 2016-04-14: 75 mL via INTRAVENOUS

## 2016-04-14 MED ORDER — ACETAMINOPHEN 500 MG PO TABS
ORAL_TABLET | ORAL | Status: AC
Start: 2016-04-14 — End: 2016-04-14
  Administered 2016-04-14: 1000 mg
  Filled 2016-04-14: qty 2

## 2016-04-14 MED ORDER — SODIUM CHLORIDE 0.9 % IV BOLUS (SEPSIS)
500.0000 mL | Freq: Once | INTRAVENOUS | Status: AC
  Administered 2016-04-14: 500 mL via INTRAVENOUS

## 2016-04-14 NOTE — ED Triage Notes (Signed)
Pt states having between the shoulder blade pain last night, took 2 nitro and it relieved the pain, pt states that the pain was realated to a fall a week ago, states that she missed her walker handles causing her to fall at that time, pt reports hx of MI

## 2016-04-14 NOTE — Telephone Encounter (Signed)
Do try to get in touch with cardiology - I'm glad nitro helped.  Is she feeling ok now ?

## 2016-04-14 NOTE — Telephone Encounter (Signed)
S/w pt daughter, [name], of Ryan's recommendations to proceed to ED for full evaluation. She verbalized understanding and will take her now. She had no further questions at this time.  Notified Raquel Sarna, ER nurse station, that patient will arrive shortly.

## 2016-04-14 NOTE — ED Provider Notes (Addendum)
[facility] Emergency Department Provider Note   ____________________________________________   First MD Initiated Contact with Patient 04/14/16 1202     (approximate)  I have reviewed the triage vital signs and the nursing notes.   HISTORY  Chief Complaint Back Pain    HPI Bianca Mueller is a 82 y.o. female with history of chronic artery disease as post stents, CHF, hyperlipidemia, hypertension, diabetes who presents for evaluation of resolved upper back pain between the shoulder blades that began last night at rest, resolved with time and after she had taken 2 subungual nitroglycerin tablets, no recurrence since that time. No chest pain or difficulty breathing at any time. Initially her symptoms were moderate, now resolved. She called her cardiologist this morning who recommended she come to the emergency department. Today she reports she has felt well, she has no complaints. No recent illness including no vomiting, diarrhea, fevers, chills or cough.   Past Medical History:  Diagnosis Date  . Arthritis    "hips" (08/07/2014)  . CAD (coronary artery disease)    a. 1989 s/p MI, TPA, PTCA;  b. 06/2014 NSTEMI/Cath: LM nl, LAD 30-40, D2 90, small, LCX 133m OM1 90, RCA Cs2+ 70-80p, 50-614m90d (? ruptured plaque), EF 55-60%-->Med Rx.  . Chronic diastolic CHF (congestive heart failure) (HCRed Oak   a. 07/2014 Echo: EF 60-65%, Gr 1 DD, mild to mod TR, PASP 3756m.  . Diverticulosis   . Elevated LFTs    a. 06/2014 Abd U/S: no GB pathology.  . GMarland KitchenRD (gastroesophageal reflux disease)   . Hx of adenomatous colonic polyps 2010   last colonscopy 2010,Dr EllVira Agarno need for further  colonoscopy given age  . Hypercalcemia   . Hyperlipidemia   . Hypertension   . Myocardial infarction (HCCMidland989  . Osteoporosis   . PAF/PAFlutter    a. 06/2014->amio and coumadin started.  . Type II diabetes mellitus (HCCKing[place] . Zoster    past    Patient Active Problem List    Diagnosis Date Noted  . Hypokalemia 11/08/2015  . Anxiety 11/08/2015  . CAP (community acquired pneumonia) 11/08/2015  . Weakness 10/31/2015  . Urinary tract infectious disease   . Contusion   . Syncope 09/21/2015  . Bradycardia 09/21/2015  . Gait disorder 09/21/2015  . History of fall 08/24/2015  . Hearing loss 08/24/2015  . Chest wall contusion 08/24/2015  . Cough 02/26/2015  . Lightheaded 11/06/2014  . Leukocytosis 08/07/2014  . Acute on chronic diastolic heart failure (HCCSheridan Lake1/20/2015  . Encounter for therapeutic drug monitoring 08/06/2014  . Chronic diastolic CHF (congestive heart failure) (HCCCoh[place] . CAD (coronary artery disease)   . Type II diabetes mellitus (HCC[place] . Hyperlipidemia   . Acute on chronic diastolic CHF (congestive heart failure), NYHA class 3 (HCCPleasant Valley1/05/2014  . Atrial fibrillation (HCC[place] . Nausea without vomiting   . Transaminitis 07/20/2014  . Atrial flutter (HCCGordon1/10/2013  . NSTEMI (non-ST elevated myocardial infarction) (HCCTraverse0/31/2015  . Dyslipidemia 07/18/2014  . Leg cramps 02/17/2014  . Encounter for Medicare annual wellness exam 08/19/2013  . Skin lesion 03/16/2013  . Other screening mammogram 05/17/2012  . Adult failure to thrive 05/17/2011  . Palpitations 02/16/2011  . DM2 (diabetes mellitus, type 2) (HCCLoghill Village5/29/2012  . ANEMIA, VITAMIN B12 DEFICIENCY 09/26/2010  . PEDAL EDEMA 03/16/2010  . SHE[place]mountLERGY 09/06/2009  . Vitamin D deficiency 03/02/2009  . DIVERTICULITIS, COLON 11/12/2008  . COLONIC POLYPS, HX OF  09/02/2008  . HYPERCHOLESTEROLEMIA 12/10/2006  . Essential hypertension 12/10/2006  . Osteoporosis 03/18/2002    Past Surgical History:  Procedure Laterality Date  . CARDIAC CATHETERIZATION  07/22/2014  . CORONARY ANGIOPLASTY  1989   "@ [facility]"  . LEFT HEART CATHETERIZATION WITH CORONARY ANGIOGRAM N/A 07/22/2014   Procedure: LEFT HEART CATHETERIZATION WITH CORONARY ANGIOGRAM;  Surgeon: Henry W Smith III, MD;  Location: [facility] CATH  LAB;  Service: Cardiovascular;  Laterality: N/A;  . SHOULDER OPEN ROTATOR CUFF REPAIR Right 1980's  . VAGINAL HYSTERECTOMY  1970's?   ovaries intact    Prior to Admission medications   Medication Sig Start Date End Date Taking? Authorizing Provider  ACCU-CHEK AVIVA PLUS test strip USE AS INSTRUCTED TO CHECK BLOOD SUGAR ONCE DAILY OR AS NEEDED.   03/17/16   Marne A Tower, MD  ACCU-CHEK FASTCLIX LANCETS MISC Use as instructed to test blood sugar once daily or as needed.  Diagnosis:  E11.9   Non insulin-dependent. 12/08/14   Marne A Tower, MD  amiodarone (PACERONE) 200 MG tablet Take 1 tablet (200 mg total) by mouth daily. 11/02/15   Jessica U Vann, DO  Blood Glucose Monitoring Suppl (ACCU-CHEK AVIVA PLUS) W/DEVICE KIT Use as instructed to test blood sugar once daily or as needed.  Diagnosis:  E11.9  Non insulin-dependent. 12/08/14   Marne A Tower, MD  busPIRone (BUSPAR) 15 MG tablet Take 0.5 tablets (7.5 mg total) by mouth 2 (two) times daily. 11/29/15   Marne A Tower, MD  cholecalciferol (VITAMIN D) 1000 UNITS tablet Take 2,000 Units by mouth daily at 12 noon.     Historical Provider, MD  cloNIDine (CATAPRES) 0.2 MG tablet Take 1 tablet (0.2 mg total) by mouth 2 (two) times daily. 12/06/15   Marne A Tower, MD  docusate sodium 100 MG CAPS Take 100 mg by mouth 2 (two) times daily. 07/29/14   Rhonda G Barrett, PA-C  doxazosin (CARDURA) 4 MG tablet Take 1 tablet (4 mg total) by mouth 2 (two) times daily. 02/15/16   Marne A Tower, MD  furosemide (LASIX) 40 MG tablet Take 1 tablet (40 mg total) by mouth daily. As directed by cardiologist, Pt may take additional doses as needed Patient taking differently: Take 40 mg by mouth 2 (two) times daily as needed. As directed by cardiologist, Pt may take additional doses as needed 11/02/15   Jessica U Vann, DO  hydrALAZINE (APRESOLINE) 100 MG tablet Take 1 tablet (100 mg total) by mouth 3 (three) times daily. 01/06/16   Marne A Tower, MD  levothyroxine (LEVOTHROID) 25 MCG  tablet Take 1 tablet (25 mcg total) by mouth daily before breakfast. 02/03/16   Marne A Tower, MD  lisinopril (PRINIVIL,ZESTRIL) 20 MG tablet TAKE 1 TABLET EVERY DAY AT NOON 12/06/15   Marne A Tower, MD  metoprolol tartrate (LOPRESSOR) 25 MG tablet Take 0.5 tablets (12.5 mg total) by mouth 2 (two) times daily. 09/18/15   Kevin Steinl, MD  nitroGLYCERIN (NITROSTAT) 0.4 MG SL tablet Place 1 tablet (0.4 mg total) under the tongue every 5 (five) minutes as needed. For chest pain and if not improved seek medical attention in ER. 07/03/13   Marne A Tower, MD  omeprazole (PRILOSEC) 20 MG capsule Take 20 mg by mouth daily.    Historical Provider, MD  pravastatin (PRAVACHOL) 20 MG tablet Take 1 tablet (20 mg total) by mouth daily. 12/06/15   Marne A Tower, MD  sitaGLIPtin (JANUVIA) 100 MG tablet Take 1 tablet (100 mg total) by mouth   daily. 12/06/15   Abner Greenspan, MD  vitamin B-12 (CYANOCOBALAMIN) 1000 MCG tablet Take 1,000 mcg by mouth daily.    Historical Provider, MD  warfarin (COUMADIN) 3 MG tablet Take as directed by anti-coagulation clinic. 03/13/16   Abner Greenspan, MD    Allergies Alendronate sodium; Doxycycline; Glipizide; Isosorbide nitrate; Lidocaine; Metformin and related; Metronidazole; and Nifedipine  Family History  Problem Relation Age of Onset  . Heart disease Mother     CAD  . Heart disease Father     CAD  . Heart disease Brother     CAD    Social History Social History  Substance Use Topics  . Smoking status: Former Smoker    Packs/day: 0.25    Years: 30.00    Types: Cigarettes  . Smokeless tobacco: Never Used     Comment: "quit smoking in the 1980's"  . Alcohol use No    Review of Systems Constitutional: No fever/chills Eyes: No visual changes. ENT: No sore throat. Cardiovascular: Denies chest pain. Respiratory: Denies shortness of breath. Gastrointestinal: No abdominal pain.  No nausea, no vomiting.  No diarrhea.  No constipation. Genitourinary: Negative for  dysuria. Musculoskeletal: Positive for back pain. Skin: Negative for rash. Neurological: Negative for headaches, focal weakness or numbness.  10-point ROS otherwise negative.  ____________________________________________   PHYSICAL EXAM:  Today's Vitals   04/14/16 1430 04/14/16 1445 04/14/16 1501 04/14/16 1542  BP:   (!) 215/80 (!) 205/96  Pulse: 62 70    Resp: 11 17    Temp:      TempSrc:      SpO2: 98% 100%    Weight:      Height:      PainSc:        VITAL SIGNS: ED Triage Vitals  Enc Vitals Group     BP 04/14/16 1142 (!) 211/84     Pulse Rate 04/14/16 1142 68     Resp 04/14/16 1142 16     Temp 04/14/16 1142 98.2 F (36.8 C)     Temp Source 04/14/16 1142 Oral     SpO2 04/14/16 1142 97 %     Weight 04/14/16 1142 113 lb (51.3 kg)     Height 04/14/16 1142 5' 2" (1.575 m)     Head Circumference --      Peak Flow --      Pain Score 04/14/16 1315 8     Pain Loc --      Pain Edu? --      Excl. in [place]? --     Constitutional: Alert and oriented. Well appearing and in no acute distress. Eyes: Conjunctivae are normal. PERRL. EOMI. Head: Atraumatic. Nose: No congestion/rhinnorhea. Mouth/Throat: Mucous membranes are moist.  Oropharynx non-erythematous. Neck: No stridor.   Cardiovascular: Normal rate, regular rhythm. Grossly normal heart sounds.  Good peripheral circulation. Respiratory: Normal respiratory effort.  No retractions. Lungs CTAB. Gastrointestinal: Soft and nontender. No distention. No CVA tenderness. Genitourinary: deferred Musculoskeletal: No lower extremity tenderness nor edema.  No joint effusions. Neurologic:  Normal speech and language. No gross focal neurologic deficits are appreciated. No gait instability. Skin:  Skin is warm, dry and intact. No rash noted. Psychiatric: Mood and affect are normal. Speech and behavior are normal.  ____________________________________________   LABS (all labs ordered are listed, but only abnormal results are  displayed)  Labs Reviewed  BASIC METABOLIC PANEL - Abnormal; Notable for the following:       Result Value   Sodium 133 (*)  Chloride 95 (*)    Glucose, Bld 185 (*)    GFR calc non Af Amer 57 (*)    All other components within normal limits  CBC - Abnormal; Notable for the following:    RBC 5.30 (*)    RDW 14.8 (*)    All other components within normal limits  PROTIME-INR - Abnormal; Notable for the following:    Prothrombin Time 22.7 (*)    All other components within normal limits  APTT - Abnormal; Notable for the following:    aPTT 54 (*)    All other components within normal limits  URINALYSIS COMPLETEWITH MICROSCOPIC ([facility] ONLY) - Abnormal; Notable for the following:    Color, Urine COLORLESS (*)    APPearance CLEAR (*)    Glucose, UA >500 (*)    Bacteria, UA RARE (*)    Squamous Epithelial / LPF 0-5 (*)    All other components within normal limits  TROPONIN I   ____________________________________________  EKG  ED ECG REPORT I, ,  A, the attending physician, personally viewed and interpreted this ECG.   Date: 04/14/2016  EKG Time: 11:34  Rate: 71  Rhythm: normal sinus rhythm  Axis: normal  Intervals:none  ST&T Change: Chronic mild ST depression in the inferior and lateral leads which was also seen on prior EKGs obtained on 12/08/2015 as well as 10/31/2015. When accounting for differences in lead patient, the EKG is unchanged.  ____________________________________________  RADIOLOGY  CTA chest IMPRESSION: No evidence of aortic aneurysm or dissection. No evidence of pulmonary embolus. Cardiomegaly. Coronary artery disease. Aortic atherosclerosis. No acute cardiopulmonary disease. Small hiatal hernia. Bibasilar scarring. ____________________________________________   PROCEDURES  Procedure(s) performed: None  Procedures  Critical Care performed: No  ____________________________________________   INITIAL IMPRESSION / ASSESSMENT AND  PLAN / ED COURSE  Pertinent labs & imaging results that were available during my care of the patient were reviewed by me and considered in my medical decision making (see chart for details).  Bianca Mueller is a 82 y.o. female with history of chronic artery disease as post stents, CHF, hyperlipidemia, hypertension, diabetes who presents for evaluation of resolved upper back pain between the shoulder blades that began last night at rest, resolved with time and after she had taken 2 subungual nitroglycerin tablets. On exam, she is very well-appearing and in no acute distress. Vital signs are notable for hypertension mother made of her vital signs are stable and she is afebrile. She is completely asymptomatic and has no complaints. She has not had any pain since she had that back pain yesterday evening. EKG shows mild ST depression in the inferior and lateral leads however this was present on her prior EKG obtained on 12/08/2015. We'll obtain screening labs, cardiac enzymes as well as CTA chest to rule out dissection. Reassess for disposition.  ----------------------------------------- 3:26 PM on 04/14/2016 ----------------------------------------- Patient reports she feels well. She has been in the emergency department for several hours and has had no pain complaints. Additionally she has had no recurrence of back pain since last night, no chest pain or difficulty breathing at any point. She did report to me that she remembered that she had fallen approximately a week ago and since that time has had intermittent upper back pain which was worse last night so this may be musculoskeletal in nature. Her EKG is unchanged from prior. Troponin negative, no utility for repeat troponin as she hasn't had any symptoms since last night. Not consistent with ACS. CTA chest shows   no acute aortic dissection or aneurysm. INR is 1.97. CBC and BMP are generally unremarkable. I discussed the case with Dr. Fletcher Anon, on call for Dr.  Rockey Situ, who agrees ACS unlikely, he will help to ensure close follow-up next week. I discussed this with the patient, we discussed meticulous return precautions and she and her daughter are comfortable with the discharge plan. Blood pressure at the time of discharge is 205/96, she is asymptomatic, I encouraged her to take her home blood pressure medications when she gets home and follow-up with her doctor for re-check of bp next week.   Clinical Course     ____________________________________________   FINAL CLINICAL IMPRESSION(S) / ED DIAGNOSES  Final diagnoses:  Midline thoracic back pain      NEW MEDICATIONS STARTED DURING THIS VISIT:  New Prescriptions   No medications on file     Note:  This document was prepared using Dragon voice recognition software and may include unintentional dictation errors.    Joanne Gavel, MD 04/14/16 [place] Xavius Spadafore, MD 04/14/16 [place] Jermani Pund, MD 04/14/16 [phone]

## 2016-04-14 NOTE — Telephone Encounter (Signed)
Patient experienced back pain last evening that Tylenol would not relieve.  Patient then took a Nitroglycerin with minor relief and then another in 5 minutes that relieved the pain.  No pain this morning but concern about the event last evening.  A note has been put in to Cardiology through the office receptionist, awaiting call back.  Do you have any additional advice?

## 2016-04-14 NOTE — Telephone Encounter (Signed)
Patient is feeling better now.  Patient has heard from Cardiology and the PA is seeing her today at 2 pm.  Thanks for your help.

## 2016-04-14 NOTE — Telephone Encounter (Signed)
Patient will need labs/troponin. This cannot be done in the office. Recommend she be taken to the ED for full evaluation.

## 2016-04-14 NOTE — ED Notes (Signed)
Pt discharged home after verbalizing understanding of discharge instructions; nad noted. 

## 2016-04-14 NOTE — ED Notes (Signed)
Pt taken to ct 

## 2016-04-14 NOTE — Telephone Encounter (Signed)
Spoke with patient's daughter, Bianca Mueller, [place].  She states patient had significant episode of back pain during the night last night.  States it was unrelieved by Tylenol.  Patient took 1 NTG and had some relief then took another 5 min later and had complete relief.  Daughter denies hx of back problems for patient and states she does not have any pain presently.  States this is the first event of this type.  Denies chest pain or discomfort, SOB or swelling.  Hx CHF, CAD. Daughter requests appointment in the office or wants to know if she should take patient to the hospital.  I advised that I will discuss with providers at [facility] office and call her back.  She verbalized understanding and agreement.  Patient is scheduled to see Christell Faith, PA today at 2:00 at [facility] office.  Daughter verbalized understanding and agreement with plan; states she is comfortable waiting for afternoon appointment because patient is not in pain currently.  States she would have taken her to the hospital if pain persisted.  She thanked me for the call.

## 2016-04-14 NOTE — Telephone Encounter (Signed)
Pt evaluated in ED today. Per Dr. Fletcher Anon, pt needs to be seen in the office Monday. S/w pt daughter, Bianca Mueller, who is agreeable to f/u with Bianca Mueller, July 31 @ 1:30pm. She had no further questions at this time.

## 2016-04-14 NOTE — Telephone Encounter (Signed)
Pt daughter calling stating that last night pt was complaining of back pain.  But when she takes her Nitro it seems to ease off She is calling asking what needs to be done. Please advise.

## 2016-04-14 NOTE — ED Notes (Signed)
Pt reports resolved back pain last night. Hx of MI x 2. Pt denies pain at this time. Pt takes coumadin.

## 2016-04-17 ENCOUNTER — Ambulatory Visit (INDEPENDENT_AMBULATORY_CARE_PROVIDER_SITE_OTHER): Payer: Commercial Managed Care - HMO | Admitting: Physician Assistant

## 2016-04-17 ENCOUNTER — Encounter: Payer: Self-pay | Admitting: Physician Assistant

## 2016-04-17 VITALS — BP 182/90 | HR 63 | Ht 62.0 in | Wt 112.0 lb

## 2016-04-17 DIAGNOSIS — I480 Paroxysmal atrial fibrillation: Secondary | ICD-10-CM | POA: Diagnosis not present

## 2016-04-17 DIAGNOSIS — E785 Hyperlipidemia, unspecified: Secondary | ICD-10-CM

## 2016-04-17 DIAGNOSIS — I10 Essential (primary) hypertension: Secondary | ICD-10-CM | POA: Diagnosis not present

## 2016-04-17 DIAGNOSIS — I2510 Atherosclerotic heart disease of native coronary artery without angina pectoris: Secondary | ICD-10-CM | POA: Diagnosis not present

## 2016-04-17 DIAGNOSIS — I5032 Chronic diastolic (congestive) heart failure: Secondary | ICD-10-CM | POA: Diagnosis not present

## 2016-04-17 DIAGNOSIS — I209 Angina pectoris, unspecified: Secondary | ICD-10-CM

## 2016-04-17 DIAGNOSIS — R0989 Other specified symptoms and signs involving the circulatory and respiratory systems: Secondary | ICD-10-CM

## 2016-04-17 DIAGNOSIS — M546 Pain in thoracic spine: Secondary | ICD-10-CM

## 2016-04-17 DIAGNOSIS — R296 Repeated falls: Secondary | ICD-10-CM

## 2016-04-17 MED ORDER — LISINOPRIL 40 MG PO TABS: ORAL_TABLET | ORAL | 3 refills | Status: DC

## 2016-04-17 NOTE — Progress Notes (Signed)
Cardiology Office Note Date:  04/17/2016  Patient ID:  Bianca Mueller, Bianca Mueller 1925/10/24, MRN [id number] PCP:  Loura Pardon, MD  Cardiologist:  Dr. Rockey Situ, MD    Chief Complaint: Back pain, ED follow up  History of Present Illness: Bianca Mueller is a 82 y.o. female with history of CAD with NSTEMI in 51/0258 complicated by PAF/flutter, chronic diastolic CHF, labile HTN, prior episode of syncope in early 2017 s/p 30-day event monitor 01/2016 without significant arrhythmia, LE swelling, prior mechanical falls, elevated LFTs which has precluded statin usage, DM2, and HLD who presents for ED follow up of back pain 2/2 mechanical fall.   Cardiac cath in 07/22/2014 in the setting of a NSTEMI (tearing back pain) showed severe diagonal, OM1, and RCA disease. It was unclear which stenosis was responsible for the troponin elevation and an initial trial of medical therapy was advised. CTA of the chest was negative for dissection. 2-D echo showed normal LV function at that time. She did experience intermittent PAF with RVR during this admission and was ultimately discharged on Coumadin and Plavix. No ischemic evaluations since.   She called the office on 7/28 with complaints of upper midline back pain s/p mechanical fall 2 weeks prior, after not holding on to her walker in the restroom in the middle of the night. She was advised to go to the ED given her prior NSTEMI was with back pain. In the ED she had negative troponin x 1, EKG non-acute, CTA chest showed no evidence of aneurysm or dissection. No PE. Her BP was noted to be in the low 527'P systolic. She was asymptomatic. Her symptoms were felt to be non-acute. She was discharged with outpatient follow up.    Since her ED visit, she has not had any further back pain. She has never had any chest pain, SOB, palpitations, nausea, vomiting, diaphoresis, dizziness, presyncope, or syncope. She reports her BP running in the 824'M systolic at home after taking her  medications. She has not missed any doses. She does not have any complaints at this time.    Past Medical History:  Diagnosis Date  . Arthritis    "hips" (08/07/2014)  . CAD (coronary artery disease)    a. 1989 s/p MI, TPA, PTCA;  b. 06/2014 NSTEMI/Cath: LM nl, LAD 30-40, D2 90, small, LCX 137m OM1 90, RCA Cs2+ 70-80p, 50-656m90d (? ruptured plaque), EF 55-60%-->Med Rx.  . Chronic diastolic CHF (congestive heart failure) ([place]   a. 07/2014 Echo: EF 60-65%, Gr 1 DD, mild to mod TR, PASP 3715m.  . Diverticulosis   . Elevated LFTs    a. 06/2014 Abd U/S: no GB pathology.  . GMarland KitchenRD (gastroesophageal reflux disease)   . Hx of adenomatous colonic polyps 2010   last colonscopy 2010,Dr EllVira Agarno need for further  colonoscopy given age  . Hypercalcemia   . Hyperlipidemia   . Hypertension   . Myocardial infarction ([facility]  . Osteoporosis   . PAF/PAFlutter    a. 06/2014->amio and coumadin started.  . Type II diabetes mellitus ([place] . Zoster    past    Past Surgical History:  Procedure Laterality Date  . CARDIAC CATHETERIZATION  07/22/2014  . CORONARY ANGIOPLASTY  1989   "@ [facility]"  . LEFT HEART CATHETERIZATION WITH CORONARY ANGIOGRAM N/A 07/22/2014   Procedure: LEFT HEART CATHETERIZATION WITH CORONARY ANGIOGRAM;  Surgeon: HenSinclair GroomsD;  [facility];  Service: Cardiovascular;  Laterality: N/A;  .  SHOULDER OPEN ROTATOR CUFF REPAIR Right 1980's  . VAGINAL HYSTERECTOMY  1970's?   ovaries intact    Current Outpatient Prescriptions  Medication Sig Dispense Refill  . ACCU-CHEK AVIVA PLUS test strip USE AS INSTRUCTED TO CHECK BLOOD SUGAR ONCE DAILY OR AS NEEDED.   100 each 4  . ACCU-CHEK FASTCLIX LANCETS MISC Use as instructed to test blood sugar once daily or as needed.  Diagnosis:  E11.9   Non insulin-dependent. 102 each 4  . amiodarone (PACERONE) 200 MG tablet Take 1 tablet (200 mg total) by mouth daily. 180 tablet 3  . Blood Glucose Monitoring Suppl (ACCU-CHEK  AVIVA PLUS) W/DEVICE KIT Use as instructed to test blood sugar once daily or as needed.  Diagnosis:  E11.9  Non insulin-dependent. 1 kit 0  . busPIRone (BUSPAR) 15 MG tablet Take 0.5 tablets (7.5 mg total) by mouth 2 (two) times daily. 90 tablet 3  . cholecalciferol (VITAMIN D) 1000 UNITS tablet Take 2,000 Units by mouth daily at 12 noon.     . cloNIDine (CATAPRES) 0.2 MG tablet Take 1 tablet (0.2 mg total) by mouth 2 (two) times daily. 180 tablet 3  . docusate sodium 100 MG CAPS Take 100 mg by mouth 2 (two) times daily. 10 capsule 0  . doxazosin (CARDURA) 4 MG tablet Take 1 tablet (4 mg total) by mouth 2 (two) times daily. 180 tablet 1  . furosemide (LASIX) 40 MG tablet Take 1 tablet (40 mg total) by mouth daily. As directed by cardiologist, Pt may take additional doses as needed (Patient taking differently: Take 40 mg by mouth 2 (two) times daily as needed. As directed by cardiologist, Pt may take additional doses as needed) 180 tablet 2  . hydrALAZINE (APRESOLINE) 100 MG tablet Take 1 tablet (100 mg total) by mouth 3 (three) times daily. 270 tablet 2  . levothyroxine (LEVOTHROID) 25 MCG tablet Take 1 tablet (25 mcg total) by mouth daily before breakfast. 90 tablet 1  . lisinopril (PRINIVIL,ZESTRIL) 20 MG tablet TAKE 1 TABLET EVERY DAY AT NOON 90 tablet 2  . metoprolol tartrate (LOPRESSOR) 25 MG tablet Take 0.5 tablets (12.5 mg total) by mouth 2 (two) times daily. 180 tablet 3  . nitroGLYCERIN (NITROSTAT) 0.4 MG SL tablet Place 1 tablet (0.4 mg total) under the tongue every 5 (five) minutes as needed. For chest pain and if not improved seek medical attention in ER. 15 tablet 0  . omeprazole (PRILOSEC) 20 MG capsule Take 20 mg by mouth daily.    . pravastatin (PRAVACHOL) 20 MG tablet Take 1 tablet (20 mg total) by mouth daily. 90 tablet 3  . sitaGLIPtin (JANUVIA) 100 MG tablet Take 1 tablet (100 mg total) by mouth daily. 90 tablet 2  . vitamin B-12 (CYANOCOBALAMIN) 1000 MCG tablet Take 1,000 mcg by  mouth daily.    Marland Kitchen warfarin (COUMADIN) 3 MG tablet Take as directed by anti-coagulation clinic. 70 tablet 3   No current facility-administered medications for this visit.     Allergies:   Alendronate sodium; Doxycycline; Glipizide; Isosorbide nitrate; Lidocaine; Metformin and related; Metronidazole; and Nifedipine   Social History:  The patient  reports that she has quit smoking. Her smoking use included Cigarettes. She has a 7.50 pack-year smoking history. She has never used smokeless tobacco. She reports that she does not drink alcohol or use drugs.   Family History:  The patient's family history includes Heart disease in her brother, father, and mother.  ROS:   Review of Systems  Constitutional:  Positive for malaise/fatigue. Negative for chills, diaphoresis, fever and weight loss.  HENT: Negative for congestion.   Eyes: Negative for discharge and redness.  Respiratory: Negative for cough, sputum production, shortness of breath and wheezing.   Cardiovascular: Negative for chest pain, palpitations, orthopnea, claudication, leg swelling and PND.  Gastrointestinal: Negative for abdominal pain, heartburn, nausea and vomiting.  Musculoskeletal: Positive for back pain. Negative for falls and myalgias.  Skin: Negative for rash.  Neurological: Negative for dizziness, tingling, tremors, sensory change, speech change, focal weakness, loss of consciousness and weakness.  Endo/Heme/Allergies: Does not bruise/bleed easily.  Psychiatric/Behavioral: Negative for substance abuse. The patient is not nervous/anxious.   All other systems reviewed and are negative.    PHYSICAL EXAM:  VS:  BP (!) 182/90 (BP Location: Left Arm, Patient Position: Sitting, Cuff Size: Normal)   Pulse 63   Ht 5' 2"  (1.575 m)   Wt 112 lb (50.8 kg)   BMI 20.49 kg/m  BMI: Body mass index is 20.49 kg/m.  Physical Exam  Constitutional: She is oriented to person, place, and time. She appears well-developed and well-nourished.   HENT:  Head: Normocephalic and atraumatic.  Eyes: Right eye exhibits no discharge. Left eye exhibits no discharge.  Neck: Normal range of motion. No JVD present.  Cardiovascular: Normal rate, regular rhythm, S1 normal, S2 normal and normal heart sounds.  Exam reveals no distant heart sounds, no friction rub, no midsystolic click and no opening snap.   No murmur heard. Pulmonary/Chest: Effort normal and breath sounds normal. No respiratory distress. She has no decreased breath sounds. She has no wheezes. She has no rales. She exhibits no tenderness.  Abdominal: Soft. She exhibits no distension. There is no tenderness.  Musculoskeletal: She exhibits no edema.  Neurological: She is alert and oriented to person, place, and time.  Skin: Skin is warm and dry. No cyanosis. Nails show no clubbing.  Psychiatric: She has a normal mood and affect. Her speech is normal and behavior is normal. Judgment and thought content normal.     EKG:  Was not ordered today.   Recent Labs: 11/02/2015: Magnesium 1.9 01/04/2016: ALT 128; TSH 4.30 04/14/2016: BUN 14; Creatinine, Ser 0.88; Hemoglobin 15.5; Platelets 306; Potassium 3.8; Sodium 133  01/04/2016: Cholesterol 172; HDL 44.80; LDL Cholesterol 96; Total CHOL/HDL Ratio 4; Triglycerides 156.0; VLDL 31.2   Estimated Creatinine Clearance: 35.6 mL/min (by C-G formula based on SCr of 0.88 mg/dL).   Wt Readings from Last 3 Encounters:  04/17/16 112 lb (50.8 kg)  04/14/16 113 lb (51.3 kg)  02/09/16 116 lb 12 oz (53 kg)     Other studies reviewed: Additional studies/records reviewed today include: summarized above  ASSESSMENT AND PLAN:  1. Back pain: None since 7/27 evening. She reports falling 2 weeks prior in the bathroom as she missed her walker. She did nto hit her back or her head. She did not suffer LOC. She was seen in the ED on 7/28 because of her back pain and had a negative work up including troponin, EKG and CTA chest. She does report some back pain  and chest pain with her prior MI several years ago. She has never been with Will schedule her for a Lexiscan Myoview to evaluate for high-risk ischemia.  2. CAD as above: Schedule Lexiscan Myoview as above. No chest pain or current symptoms concerning for angina at the time of her OV or since her ED visit as above. On warfarin in place of aspirin. Continue Lopressor 12.5 mg bid (  heart rate precludes further titration). Continue statin. Has intolerance to Imdur, thus cannot add this.  3. Chronic diastolic CHF: She appears euvolemic at this time. Continue Lopressor as above and Lasix as previously directed. CHF education.  4. PAF: No symptoms concerning for palpitations or Afib. She appears to be in sinus rhythm today. Continue Lopressor and warfarin as above. CHADS2VASc at least 6 (CHF, HTN, age x 2, vascular disease, female). 5. Labile HTN: She reports home blood pressures in the 163'W systolic s/p taking her medications at home. Her BP was elevated in the low 466'Z systolic in the ED and she is 993'T systolic this afternoon. She is asymptomatic. She prefers to not add medications at this time. Will increase lisinopril to 40 mg daily. She will take her BP at home and call with readings on 04/20/16. 6. Frequent falls: Use walker.  7. HLD: Statin.   Disposition: F/u with me in 1 month   Current medicines are reviewed at length with the patient today.  The patient did not have any concerns regarding medicines.  Melvern Banker PA-C 04/17/2016 1:32 PM     [address] [place], [place] [address] [phone]

## 2016-04-17 NOTE — Patient Instructions (Addendum)
Medication Instructions:  Please increase your lisinopril to 40 mg once daily  Labwork: None  Testing/Procedures: [place] Your caregiver has ordered a Stress Test with nuclear imaging. The purpose of this test is to evaluate the blood supply to your heart muscle. This procedure is referred to as a "Non-Invasive Stress Test." This is because other than having an IV started in your vein, nothing is inserted or "invades" your body. Cardiac stress tests are done to find areas of poor blood flow to the heart by determining the extent of coronary artery disease (CAD). Some patients exercise on a treadmill, which naturally increases the blood flow to your heart, while others who are  unable to walk on a treadmill due to physical limitations have a pharmacologic/chemical stress agent called Lexiscan . This medicine will mimic walking on a treadmill by temporarily increasing your coronary blood flow.   Please note: these test may take anywhere between 2-4 hours to complete  PLEASE REPORT TO [place] AT THE FIRST DESK WILL DIRECT YOU WHERE TO GO  Date of Procedure:_____Thursday, Aug 3___  Arrival Time for Procedure:___8:15 am______  Instructions regarding medication:   __X__:  Hold METOPROLOL the night before and morning of procedure  How to prepare for your Myoview test:  1. Do not eat or drink after midnight 2. No caffeine for 24 hours prior to test 3. No smoking 24 hours prior to test. 4. Your medication may be taken with water.  If your doctor stopped a medication because of this test, do not take that medication. 5. Ladies, please do not wear dresses.  Skirts or pants are appropriate. Please wear a short sleeve shirt. 6. No perfume, cologne or lotion.  Follow-Up: After your testing  If you need a refill on your cardiac medications before your next appointment, please call your pharmacy.  Cardiac Nuclear Scanning A cardiac nuclear scan is used to  check your heart for problems, such as the following:  A portion of the heart is not getting enough blood.  Part of the heart muscle has died, which happens with a heart attack.  The heart wall is not working normally.  In this test, a radioactive dye (tracer) is injected into your bloodstream. After the tracer has traveled to your heart, a scanning device is used to measure how much of the tracer is absorbed by or distributed to various areas of your heart. LET [facility] CARE PROVIDER KNOW ABOUT:  Any allergies you have.  All medicines you are taking, including vitamins, herbs, eye drops, creams, and over-the-counter medicines.  Previous problems you or members of your family have had with the use of anesthetics.  Any blood disorders you have.  Previous surgeries you have had.  Medical conditions you have.  RISKS AND COMPLICATIONS Generally, this is a safe procedure. However, as with any procedure, problems can occur. Possible problems include:   Serious chest pain.  Rapid heartbeat.  Sensation of warmth in your chest. This usually passes quickly. BEFORE THE PROCEDURE Ask your health care provider about changing or stopping your regular medicines. PROCEDURE This procedure is usually done at a hospital and takes 2-4 hours.  An IV tube is inserted into one of your veins.  Your health care provider will inject a small amount of radioactive tracer through the tube.  You will then wait for 20-40 minutes while the tracer travels through your bloodstream.  You will lie down on an exam table so images of  your heart can be taken. Images will be taken for about 15-20 minutes.  You will exercise on a treadmill or stationary bike. While you exercise, your heart activity will be monitored with an electrocardiogram (ECG), and your blood pressure will be checked.  If you are unable to exercise, you may be given a medicine to make your heart beat faster.  When blood flow to your  heart has peaked, tracer will again be injected through the IV tube.  After 20-40 minutes, you will get back on the exam table and have more images taken of your heart.  When the procedure is over, your IV tube will be removed. AFTER THE PROCEDURE  You will likely be able to leave shortly after the test. Unless your health care provider tells you otherwise, you may return to your normal schedule, including diet, activities, and medicines.  Make sure you find out how and when you will get your test results.   This information is not intended to replace advice given to you by your health care provider. Make sure you discuss any questions you have with your health care provider.   Document Released: 09/29/2004 Document Revised: 09/09/2013 Document Reviewed: 08/13/2013 Elsevier Interactive Patient Education [employer].

## 2016-04-20 ENCOUNTER — Telehealth: Payer: Self-pay | Admitting: Cardiovascular Disease

## 2016-04-20 ENCOUNTER — Encounter
Admission: RE | Admit: 2016-04-20 | Discharge: 2016-04-20 | Disposition: A | Discharge status: Home / Self Care | Payer: Commercial Managed Care - HMO | Source: Ambulatory Visit | Attending: Physician Assistant | Admitting: Physician Assistant

## 2016-04-20 DIAGNOSIS — I5032 Chronic diastolic (congestive) heart failure: Secondary | ICD-10-CM | POA: Insufficient documentation

## 2016-04-20 DIAGNOSIS — M546 Pain in thoracic spine: Secondary | ICD-10-CM | POA: Diagnosis not present

## 2016-04-20 DIAGNOSIS — I480 Paroxysmal atrial fibrillation: Secondary | ICD-10-CM | POA: Diagnosis not present

## 2016-04-20 DIAGNOSIS — I510 Cardiac septal defect, acquired: Secondary | ICD-10-CM | POA: Insufficient documentation

## 2016-04-20 DIAGNOSIS — I209 Angina pectoris, unspecified: Secondary | ICD-10-CM | POA: Diagnosis not present

## 2016-04-20 DIAGNOSIS — I2510 Atherosclerotic heart disease of native coronary artery without angina pectoris: Secondary | ICD-10-CM | POA: Insufficient documentation

## 2016-04-20 LAB — NM MYOCAR MULTI W/SPECT W/WALL MOTION / EF
CHL CUP NUCLEAR SDS: 2
CHL CUP NUCLEAR SRS: 4
CHL CUP NUCLEAR SSS: 6
CSEPPHR: 84 {beats}/min
LV dias vol: 42 mL (ref 46–106)
LVSYSVOL: 18 mL
Percent HR: 63 %
Rest HR: 63 {beats}/min
TID: 0.81

## 2016-04-20 MED ORDER — REGADENOSON 0.4 MG/5ML IV SOLN
0.4000 mg | Freq: Once | INTRAVENOUS | Status: AC
  Administered 2016-04-20: 0.4 mg via INTRAVENOUS

## 2016-04-20 MED ORDER — TECHNETIUM TC 99M TETROFOSMIN IV KIT
11.3190 | PACK | Freq: Once | INTRAVENOUS | Status: AC | PRN
  Administered 2016-04-20: 11.319 via INTRAVENOUS

## 2016-04-20 MED ORDER — TECHNETIUM TC 99M TETROFOSMIN IV KIT
31.5570 | PACK | Freq: Once | INTRAVENOUS | Status: AC | PRN
  Administered 2016-04-20: 31.557 via INTRAVENOUS

## 2016-04-20 NOTE — Telephone Encounter (Signed)
Patient called with her bp result:  04/17/16   Am 138/66  Pm 129/71  04/18/16  Am 84/50  Pm 142/72  04/19/16 am 107/66

## 2016-04-20 NOTE — Telephone Encounter (Signed)
Spoke w/ pt.  Advised her that Bianca Mueller reviewed her readings and recommends that she continue her current meds. She is appreciative and will await results of lexi that she had this am.

## 2016-04-22 ENCOUNTER — Encounter: Payer: Self-pay | Admitting: Cardiovascular Disease

## 2016-04-24 ENCOUNTER — Telehealth: Payer: Self-pay | Admitting: *Deleted

## 2016-04-24 MED ORDER — RANOLAZINE ER 500 MG PO TB12: 500.0000 mg | ORAL_TABLET | Freq: Two times a day (BID) | ORAL | 2 refills | Status: DC

## 2016-04-24 NOTE — Telephone Encounter (Signed)
I replied to my chart message that I would contact pt's daughter to discuss stress test results. I spoke with pt's daughter and reviewed stress test results and recommendations from Christell Faith, [place] with her.  Daughter requests we send prescription for Ranexa to [facility]. Will send in. Daughter reports pravastatin was decreased earlier this year due to elevated liver function tests.  I told her I would send to Laurine Blazer to review and determine if pt should still change to Atorvastatin 40 mg daily.

## 2016-04-25 ENCOUNTER — Telehealth: Payer: Self-pay | Admitting: *Deleted

## 2016-04-25 DIAGNOSIS — E785 Hyperlipidemia, unspecified: Secondary | ICD-10-CM

## 2016-04-25 MED ORDER — ATORVASTATIN CALCIUM 40 MG PO TABS: 40.0000 mg | ORAL_TABLET | Freq: Every day | ORAL | 3 refills | Status: DC

## 2016-04-25 NOTE — Telephone Encounter (Signed)
-----   Message from Rise Mu, PA-C sent at 04/21/2016 10:19 AM EDT ----- Please call the patient. Study and prior cath films/pictures were reviewed with Dr. Fletcher Anon, MD. Cath films from 2015 showed an occluded LCx. This is consistent with findings on nuclear stress testing from 04/20/16. Thus, the abnormality seen on nuclear stress testing is not new. Continue aggressive medical management. If she has recurrent symptoms Dr. Fletcher Anon would then proceed with repeat cardiac catheterization.  Change pravastatin to Lipitor 40 mg daily. Can start Ranexa 500 mg bid. Heart rate has precluded titration of beta blocker. She has an intolerance to Imdur. Needs repeat lipid and LFT in 8 weeks.

## 2016-04-25 NOTE — Telephone Encounter (Signed)
Reviewed results with patient and medication changes. She is already on Ranexa but I did send in her lipitor 40 mg daily. Also put in orders for labs and transferred her up front for scheduling. She verbalized understanding of our conversation, agreement with plan of care, and had no further questions at this time.

## 2016-04-26 DIAGNOSIS — E1165 Type 2 diabetes mellitus with hyperglycemia: Secondary | ICD-10-CM | POA: Diagnosis not present

## 2016-04-26 NOTE — Telephone Encounter (Signed)
Spoke with patients husband and he said that she is at another appointment. Will try to call back later.

## 2016-04-26 NOTE — Telephone Encounter (Signed)
LFT's remained elevated after decreased dose of pravastatin. Given persistent elevation, ok to hold Lipitor. Recommend patient follow up with PCP for liver function.

## 2016-04-27 ENCOUNTER — Encounter: Payer: Self-pay | Admitting: *Deleted

## 2016-04-27 NOTE — Telephone Encounter (Signed)
Patients daughter is confused as she reports there have been multiple changes. She sent a Mychart message and a response was sent back. Reviewed patients stress test results recommendations and changes that have taken place. Instructed her that patient is to stop taking lipitor, start ranexa 500 mg BID, and monitor blood pressures to bring to next office visit. Also reviewed appointments and she requested that her mother see Dr. Rockey Situ for her next visit. Let her know that I would get that changed and send her the appointment change. She verbalized understanding of our conversation, agreement with plan of care, and had no further questions at this time.

## 2016-04-27 NOTE — Telephone Encounter (Signed)
Left detailed voicemail message with appointment change. 06/20/16 at 09:20AM with Dr. Rockey Situ and 06/20/16 at 08:00AM for fasting labs and instructions to call back if any questions.

## 2016-05-05 ENCOUNTER — Other Ambulatory Visit: Payer: Self-pay

## 2016-05-05 ENCOUNTER — Encounter: Payer: Self-pay | Admitting: Cardiovascular Disease

## 2016-05-05 MED ORDER — RANOLAZINE ER 500 MG PO TB12: 500.0000 mg | ORAL_TABLET | Freq: Two times a day (BID) | ORAL | 6 refills | Status: DC

## 2016-05-08 ENCOUNTER — Encounter: Payer: Self-pay | Admitting: *Deleted

## 2016-05-11 ENCOUNTER — Telehealth: Payer: Self-pay | Admitting: Cardiovascular Disease

## 2016-05-11 NOTE — Telephone Encounter (Signed)
Left voicemail message for patients daughter instructing her to please review mychart messages that were sent and call if she has any further questions.

## 2016-05-11 NOTE — Telephone Encounter (Signed)
Left voicemail message regarding unread mychart messages and instructions to call back.

## 2016-05-15 ENCOUNTER — Encounter: Payer: Self-pay | Admitting: *Deleted

## 2016-05-16 ENCOUNTER — Ambulatory Visit (INDEPENDENT_AMBULATORY_CARE_PROVIDER_SITE_OTHER): Payer: Commercial Managed Care - HMO | Admitting: Cardiovascular Disease

## 2016-05-16 ENCOUNTER — Other Ambulatory Visit: Payer: Commercial Managed Care - HMO

## 2016-05-16 ENCOUNTER — Encounter: Payer: Self-pay | Admitting: Cardiovascular Disease

## 2016-05-16 ENCOUNTER — Other Ambulatory Visit: Payer: Self-pay | Admitting: *Deleted

## 2016-05-16 VITALS — BP 122/80 | HR 81 | Ht 61.0 in | Wt 113.5 lb

## 2016-05-16 DIAGNOSIS — I483 Typical atrial flutter: Secondary | ICD-10-CM

## 2016-05-16 DIAGNOSIS — I480 Paroxysmal atrial fibrillation: Secondary | ICD-10-CM

## 2016-05-16 DIAGNOSIS — R55 Syncope and collapse: Secondary | ICD-10-CM

## 2016-05-16 DIAGNOSIS — I10 Essential (primary) hypertension: Secondary | ICD-10-CM

## 2016-05-16 DIAGNOSIS — I214 Non-ST elevation (NSTEMI) myocardial infarction: Secondary | ICD-10-CM

## 2016-05-16 DIAGNOSIS — E7800 Pure hypercholesterolemia, unspecified: Secondary | ICD-10-CM | POA: Diagnosis not present

## 2016-05-16 DIAGNOSIS — I5032 Chronic diastolic (congestive) heart failure: Secondary | ICD-10-CM

## 2016-05-16 NOTE — Patient Instructions (Signed)
Medication Instructions:    OK to stop ranexa if it too expensive   Labwork:  No new labs needed  Testing/Procedures:  No further testing at this time   Follow-Up: It was a pleasure seeing you in the office today. Please call us if you have new issues that need to be addressed before your next appt.  [phone]  Your physician wants you to follow-up in: 6 months.  You will receive a reminder letter in the mail two months in advance. If you don't receive a letter, please call our office to schedule the follow-up appointment.  If you need a refill on your cardiac medications before your next appointment, please call your pharmacy.

## 2016-05-16 NOTE — Progress Notes (Signed)
Cardiology Office Note  Date:  05/16/2016   ID:  Bianca Mueller, DOB May 25, 1926, MRN [id number]  PCP:  Loura Pardon, MD   Chief Complaint  Patient presents with  . Other    Follow up from test. Meds reviewed by the patient verbally. "doing well."     HPI:  82 year old female with history of non-ST elevation MI, three-vessel disease on catheterization in 0947,  complicated by paroxysmal atrial fibrillation and flutter who presents for follow-up of her coronary artery disease and atrial flutter. Prior non-ST elevation MI October 2015 History of labile hypertension On a prior clinic visit, she reported having episode of syncope.  30 day monitor was ordered, This showed no significant arrhythmia  In follow-up, she reports that she feels well Recently seen in our clinic for chest pain symptoms. Stress test was ordered that showed no ischemia.Results discussed with her in detail She Denies having any recent chest pain before or now  Notes indicate previous fall with back injury, she denies having a fall   Uses a walker, When she goes out, uses a cane even though she feels safer with her walker  30 day monitor results reviewed with her in detail  no significant arrhythmia, only normal sinus rhythm She denies any near syncope or syncope  She does report some mild ankle swelling, takes Lasix as needed on her last clinic visit pravastatin was held, she was changed to Lipitor she reports that she is not on any cholesterol medication, we have identified the Lipitor on her list, she will check at home to see if she is taking this  She was started on ranexa on her last clinic visit, reports she is unable to afford this Patient may have helped, given her a little bit more energy  Other past medical history She did have a fall, fell between the bed and dresser, hurt her head Feels it was from her balance  Previous fall as she was getting out of her car in the garage Feels her feet  slipped, did not have good balance, went down and hurt her ribs Denies any loss of consciousness, near syncope or dizziness  no further falls since that time Reports that her balance is okay, does not need a walker  hemoglobin A1c 7.4, total cholesterol 150  In the hospital she had atrial fibrillation/flutter with RVR, started on diltiazem and amiodarone  cardiac catheterization showing severe diagonal, circumflex OM1, and RCA disease, medical management recommended Started on Coumadin and Plavix  follow-up in clinic she was still in atrial flutter with rate 86 bpm, normal sinus rhythm on her last clinic visit  Previous 2-D echocardiogram showed normal LV function.  PMH:   has a past medical history of Arthritis; CAD (coronary artery disease); Chronic diastolic CHF (congestive heart failure) ([place]); Diverticulosis; Elevated LFTs; GERD (gastroesophageal reflux disease); adenomatous colonic polyps (2010); Hypercalcemia; Hyperlipidemia; Hypertension; Myocardial infarction ([place]) (1989); Osteoporosis; PAF/PAFlutter; Type II diabetes mellitus ([place]); and Zoster.  PSH:    Past Surgical History:  Procedure Laterality Date  . CARDIAC CATHETERIZATION  07/22/2014  . CORONARY ANGIOPLASTY  1989   "@ [facility]"  . LEFT HEART CATHETERIZATION WITH CORONARY ANGIOGRAM N/A 07/22/2014   Procedure: LEFT HEART CATHETERIZATION WITH CORONARY ANGIOGRAM;  Surgeon: Sinclair Grooms, MD;  Location: [facility] CATH LAB;  Service: Cardiovascular;  Laterality: N/A;  . SHOULDER OPEN ROTATOR CUFF REPAIR Right 1980's  . VAGINAL HYSTERECTOMY  1970's?   ovaries intact    Current Outpatient Prescriptions  Medication Sig  Dispense Refill  . ACCU-CHEK AVIVA PLUS test strip USE AS INSTRUCTED TO CHECK BLOOD SUGAR ONCE DAILY OR AS NEEDED.   100 each 4  . ACCU-CHEK FASTCLIX LANCETS MISC Use as instructed to test blood sugar once daily or as needed.  Diagnosis:  E11.9   Non insulin-dependent. 102 each 4  . amiodarone (PACERONE) 200 MG  tablet Take 1 tablet (200 mg total) by mouth daily. 180 tablet 3  . atorvastatin (LIPITOR) 40 MG tablet Take 1 tablet (40 mg total) by mouth daily. 90 tablet 3  . Blood Glucose Monitoring Suppl (ACCU-CHEK AVIVA PLUS) W/DEVICE KIT Use as instructed to test blood sugar once daily or as needed.  Diagnosis:  E11.9  Non insulin-dependent. 1 kit 0  . busPIRone (BUSPAR) 15 MG tablet Take 0.5 tablets (7.5 mg total) by mouth 2 (two) times daily. 90 tablet 3  . cholecalciferol (VITAMIN D) 1000 UNITS tablet Take 2,000 Units by mouth daily at 12 noon.     . cloNIDine (CATAPRES) 0.2 MG tablet Take 1 tablet (0.2 mg total) by mouth 2 (two) times daily. 180 tablet 3  . docusate sodium 100 MG CAPS Take 100 mg by mouth 2 (two) times daily. 10 capsule 0  . doxazosin (CARDURA) 4 MG tablet Take 1 tablet (4 mg total) by mouth 2 (two) times daily. 180 tablet 1  . furosemide (LASIX) 40 MG tablet Take 1 tablet (40 mg total) by mouth daily. As directed by cardiologist, Pt may take additional doses as needed (Patient taking differently: Take 40 mg by mouth 2 (two) times daily as needed. As directed by cardiologist, Pt may take additional doses as needed) 180 tablet 2  . glipiZIDE (GLUCOTROL XL) 2.5 MG 24 hr tablet Take 2.5 mg by mouth daily with lunch.    . hydrALAZINE (APRESOLINE) 100 MG tablet Take 1 tablet (100 mg total) by mouth 3 (three) times daily. 270 tablet 2  . levothyroxine (LEVOTHROID) 25 MCG tablet Take 1 tablet (25 mcg total) by mouth daily before breakfast. 90 tablet 1  . lisinopril (PRINIVIL,ZESTRIL) 40 MG tablet TAKE 1 TABLET EVERY DAY AT NOON 90 tablet 3  . metoprolol tartrate (LOPRESSOR) 25 MG tablet Take 0.5 tablets (12.5 mg total) by mouth 2 (two) times daily. 180 tablet 3  . nitroGLYCERIN (NITROSTAT) 0.4 MG SL tablet Place 1 tablet (0.4 mg total) under the tongue every 5 (five) minutes as needed. For chest pain and if not improved seek medical attention in ER. 15 tablet 0  . omeprazole (PRILOSEC) 20 MG  capsule Take 20 mg by mouth daily.    . ranolazine (RANEXA) 500 MG 12 hr tablet Take 1 tablet (500 mg total) by mouth 2 (two) times daily. 60 tablet 6  . sitaGLIPtin (JANUVIA) 100 MG tablet Take 1 tablet (100 mg total) by mouth daily. 90 tablet 2  . vitamin B-12 (CYANOCOBALAMIN) 1000 MCG tablet Take 1,000 mcg by mouth daily.    Marland Kitchen warfarin (COUMADIN) 3 MG tablet Take as directed by anti-coagulation clinic. 70 tablet 3   No current facility-administered medications for this visit.      Allergies:   Alendronate sodium; Doxycycline; Glipizide; Isosorbide nitrate; Lidocaine; Metformin and related; Metronidazole; and Nifedipine   Social History:  The patient  reports that she has quit smoking. Her smoking use included Cigarettes. She has a 7.50 pack-year smoking history. She has never used smokeless tobacco. She reports that she does not drink alcohol or use drugs.   Family History:   family history  includes Heart disease in her brother, father, and mother.    Review of Systems: Review of Systems  Constitutional: Negative.   Respiratory: Negative.   Cardiovascular: Negative.   Gastrointestinal: Negative.   Musculoskeletal:       Gait instability  Neurological: Negative.   Psychiatric/Behavioral: Negative.   All other systems reviewed and are negative.    PHYSICAL EXAM: VS:  BP 122/80 (BP Location: Left Arm, Patient Position: Sitting, Cuff Size: Normal)   Pulse 81   Ht 5' 1"  (1.549 m)   Wt 113 lb 8 oz (51.5 kg)   BMI 21.45 kg/m  , BMI Body mass index is 21.45 kg/m. GEN: Thin elderly woman, in no acute distress, frail, walks with a cane  HEENT: normal  Neck: no JVD, carotid bruits, or masses Cardiac: RRR; no murmurs, rubs, or gallops,no edema  Respiratory:  clear to auscultation bilaterally, normal work of breathing GI: soft, nontender, nondistended, + BS MS: no deformity or atrophy  Skin: warm and dry, no rash Neuro:  Strength and sensation are intact Psych: euthymic mood,  full affect    Recent Labs: 11/02/2015: Magnesium 1.9 01/04/2016: ALT 128; TSH 4.30 04/14/2016: BUN 14; Creatinine, Ser 0.88; Hemoglobin 15.5; Platelets 306; Potassium 3.8; Sodium 133    Lipid Panel Lab Results  Component Value Date   CHOL 172 01/04/2016   HDL 44.80 01/04/2016   LDLCALC 96 01/04/2016   TRIG 156.0 (H) 01/04/2016      Wt Readings from Last 3 Encounters:  05/16/16 113 lb 8 oz (51.5 kg)  04/17/16 112 lb (50.8 kg)  04/14/16 113 lb (51.3 kg)       ASSESSMENT AND PLAN:  HYPERCHOLESTEROLEMIA Encouraged her to stay on her Lipitor  : LDL less than 70   Essential hypertension Blood pressure is well controlled on today's visit. No changes made to the medications.  NSTEMI (non-ST elevated myocardial infarction) ([place]) Previous event in the setting of atrial fibrillation, three-vessel disease  Medical management recommended   Typical atrial flutter ([place]) Denies any symptoms concerning for arrhythmia,  Recent 30 day monitor showing normal sinus rhythm   Paroxysmal atrial fibrillation ([place]) As above, recent monitor showing no arrhythmia   Chronic diastolic CHF (congestive heart failure) ([place]) Appears relatively euvolemic on today's visit. Currently takes Lasix daily   Syncope, unspecified syncope type No recent near syncope or syncope   no arrhythmia on monitor Recommended she call our office for any further symptoms    Total encounter time more than 25 minutes  Greater than 50% was spent in counseling and coordination of care with the patient   Disposition:   F/U  6 months  No orders of the defined types were placed in this encounter.    Signed, Esmond Plants, M.D., Ph.D. 05/16/2016  [place], [place]

## 2016-05-18 ENCOUNTER — Other Ambulatory Visit (INDEPENDENT_AMBULATORY_CARE_PROVIDER_SITE_OTHER): Payer: Commercial Managed Care - HMO

## 2016-05-18 ENCOUNTER — Other Ambulatory Visit: Payer: Self-pay | Admitting: Physician Assistant

## 2016-05-18 ENCOUNTER — Encounter: Payer: Self-pay | Admitting: Cardiovascular Disease

## 2016-05-18 DIAGNOSIS — Z5181 Encounter for therapeutic drug level monitoring: Secondary | ICD-10-CM | POA: Diagnosis not present

## 2016-05-18 DIAGNOSIS — I10 Essential (primary) hypertension: Secondary | ICD-10-CM

## 2016-05-18 DIAGNOSIS — I4892 Unspecified atrial flutter: Secondary | ICD-10-CM

## 2016-05-18 DIAGNOSIS — I4891 Unspecified atrial fibrillation: Secondary | ICD-10-CM | POA: Diagnosis not present

## 2016-05-18 LAB — POCT INR: INR: 3.6

## 2016-05-18 MED ORDER — LISINOPRIL 20 MG PO TABS: ORAL_TABLET | ORAL | 0 refills | Status: DC

## 2016-05-23 ENCOUNTER — Ambulatory Visit: Payer: Commercial Managed Care - HMO | Admitting: Physician Assistant

## 2016-05-31 ENCOUNTER — Telehealth: Payer: Self-pay

## 2016-05-31 NOTE — Telephone Encounter (Signed)
Many dentists do not require holding of anticoagulation for dental extractions at all - is she having one extraction or many? -this can make a difference (there would be more risk if she were to have multiple teeth pulled at once)  Let me know

## 2016-05-31 NOTE — Telephone Encounter (Signed)
Attempted to call dental office and they closed today at noon. Will have to call back later.

## 2016-05-31 NOTE — Telephone Encounter (Signed)
Olivia Mackie with Dr Leanor Kail' office left v/m; pt has appt on 06/06/16 for tooth extraction; pt is on coumadin; Olivia Mackie request cb with when pt should stop coumadin prior to extraction and when pt should restart coumadin.Please advise.

## 2016-06-01 ENCOUNTER — Other Ambulatory Visit (INDEPENDENT_AMBULATORY_CARE_PROVIDER_SITE_OTHER): Payer: Commercial Managed Care - HMO

## 2016-06-01 DIAGNOSIS — Z5181 Encounter for therapeutic drug level monitoring: Secondary | ICD-10-CM | POA: Diagnosis not present

## 2016-06-01 DIAGNOSIS — I4891 Unspecified atrial fibrillation: Secondary | ICD-10-CM | POA: Diagnosis not present

## 2016-06-01 DIAGNOSIS — I4892 Unspecified atrial flutter: Secondary | ICD-10-CM | POA: Diagnosis not present

## 2016-06-01 LAB — POCT INR: INR: 2

## 2016-06-01 NOTE — Telephone Encounter (Signed)
Bianca Mueller (dentist office) notified of Dr. Marliss Coots recommendations and she verbalized understanding, she said she will let the dentist know and if he has any questions or concerns he will call us back

## 2016-06-01 NOTE — Telephone Encounter (Signed)
Spoke with pt's daughter [name] and she said she is only getting one tooth extracted, please advise

## 2016-06-01 NOTE — Telephone Encounter (Signed)
I am comfortable with not holding coumadin if her dentist - keep me posted

## 2016-06-02 ENCOUNTER — Encounter: Payer: Self-pay | Admitting: Family Medicine

## 2016-06-05 ENCOUNTER — Telehealth: Payer: Self-pay | Admitting: Cardiovascular Disease

## 2016-06-05 NOTE — Telephone Encounter (Signed)
Olivia Mackie from Dr Leanor Kail' office left calling stating  pt has appt on 06/06/16 for tooth extraction, But pt is on coumadin They are asking if  pt should stop coumadin prior to extraction and when pt should restart coumadin. Please advise.

## 2016-06-05 NOTE — Telephone Encounter (Signed)
Dr. Glori Bickers recommends pt not hold coumadin for 1 tooth extraction.  Are you agreeable?

## 2016-06-06 ENCOUNTER — Encounter: Payer: Self-pay | Admitting: Family Medicine

## 2016-06-06 ENCOUNTER — Telehealth: Payer: Self-pay

## 2016-06-06 IMAGING — CT CT ANGIO CHEST
1 of 3 series · 18 of 32 positions shown · IV contrast (OMNIPAQUE 350)
Comparison: None.

CLINICAL DATA: Nausea and vomiting.  Intermittent upper back pain.

EXAM:
CT ANGIOGRAPHY CHEST, ABDOMEN AND PELVIS
TECHNIQUE: Multidetector CT imaging through the chest, abdomen and pelvis was
performed using the standard protocol during bolus administration of
intravenous contrast. Multiplanar reconstructed images and MIPs were
obtained and reviewed to evaluate the vascular anatomy.
CONTRAST:  100mL OMNIPAQUE IOHEXOL 350 MG/ML SOLN

[Series 5: arterial 3.0 b30f · axial · arterial · 0.64mm/px · z∈[-342,+174]mm · 18 of 192 slices shown]
[im 10/192  lung]
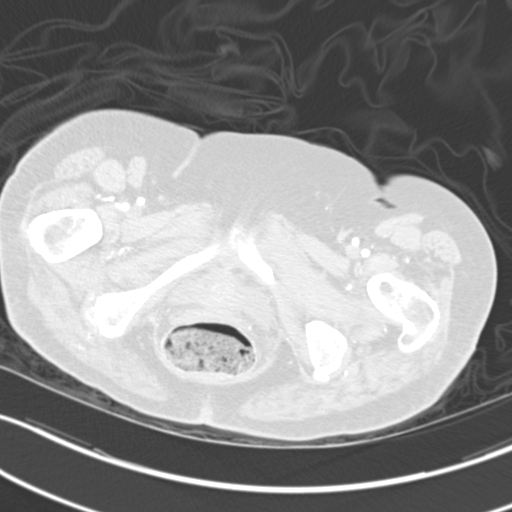
[im 20/192  soft-tissue]
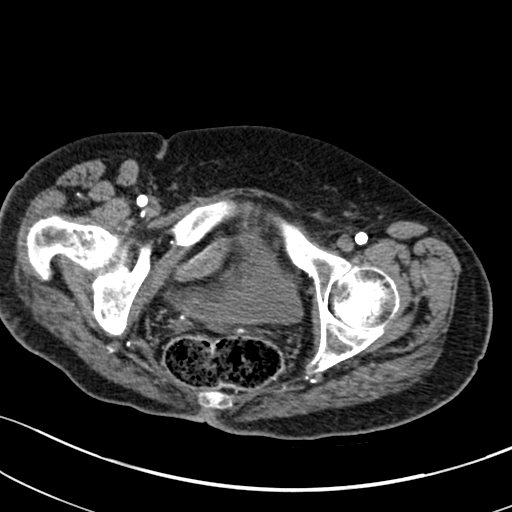
[im 29/192  lung]
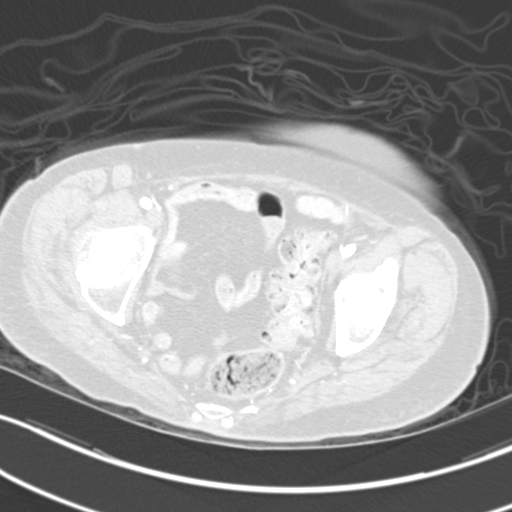
[im 39/192  soft-tissue]
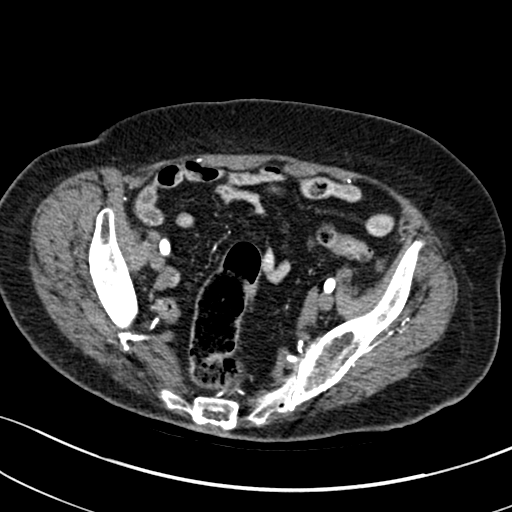
[im 48/192  lung]
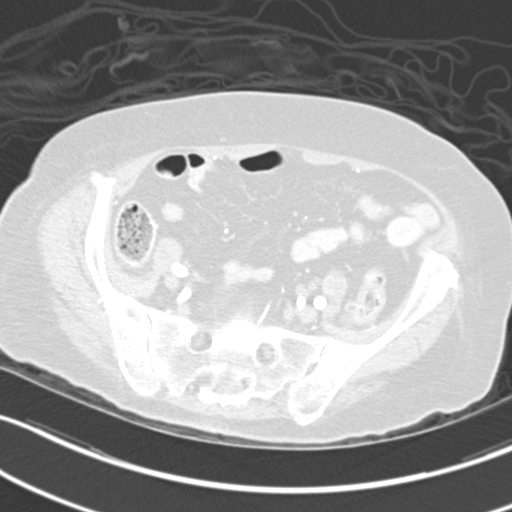
[im 58/192  soft-tissue]
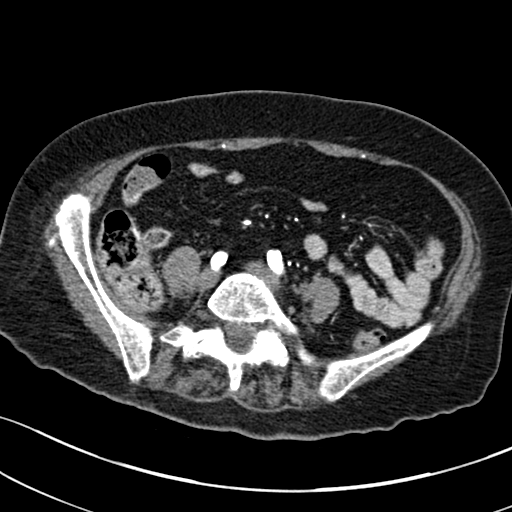
[im 67/192  lung]
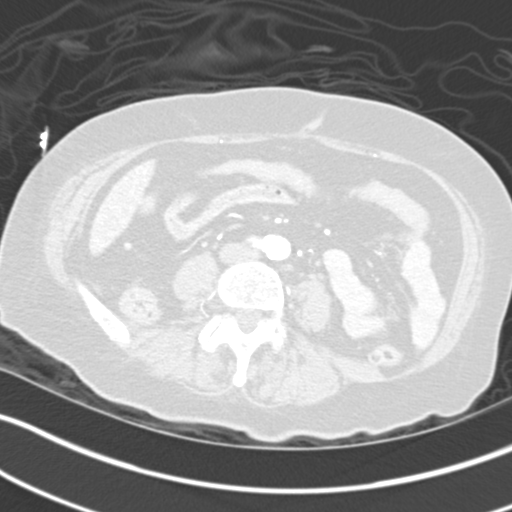
[im 77/192  soft-tissue]
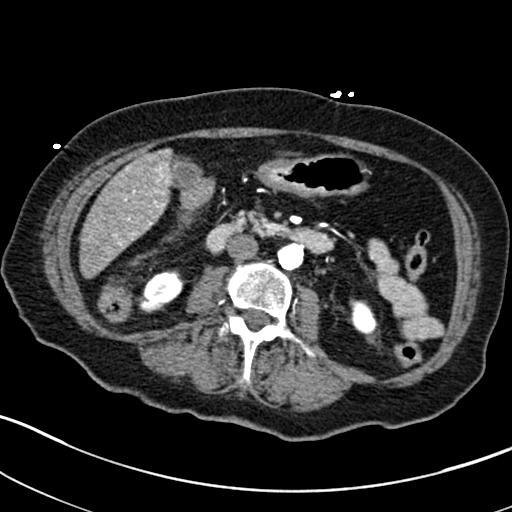
[im 86/192  lung]
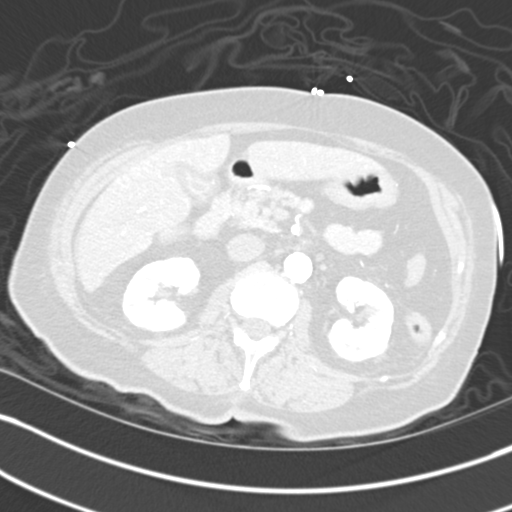
[im 106/192  soft-tissue]
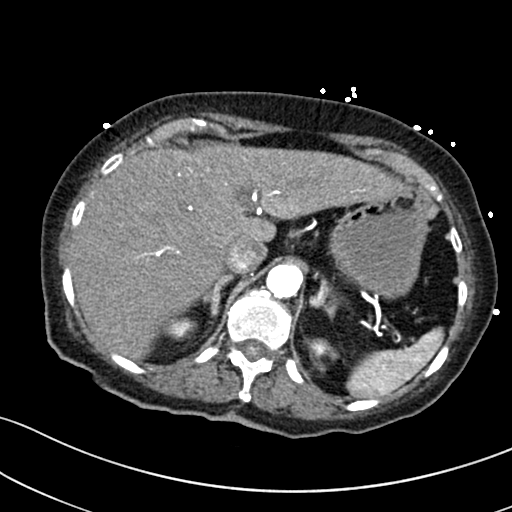
[im 115/192  lung]
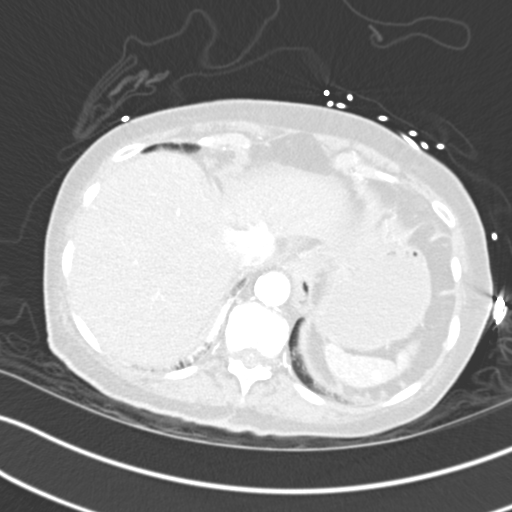
[im 125/192  soft-tissue]
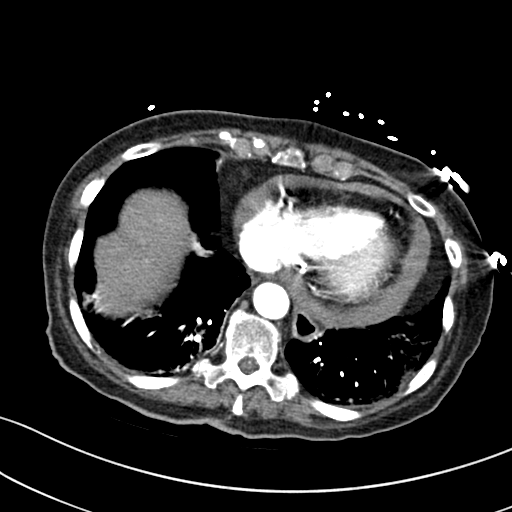
[im 134/192  lung]
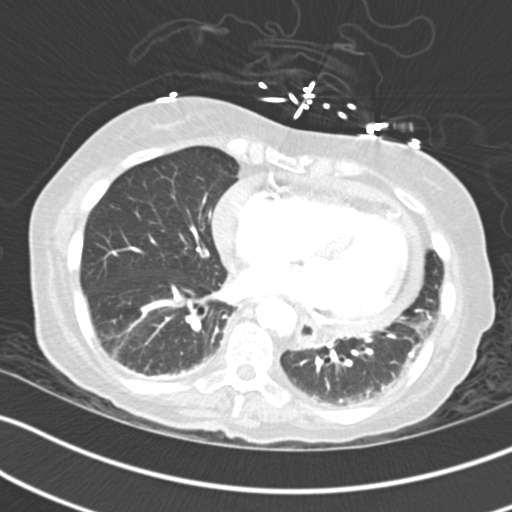
[im 144/192  soft-tissue]
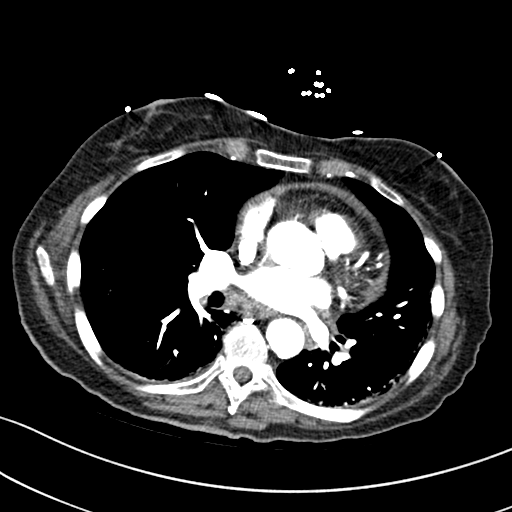
[im 153/192  lung]
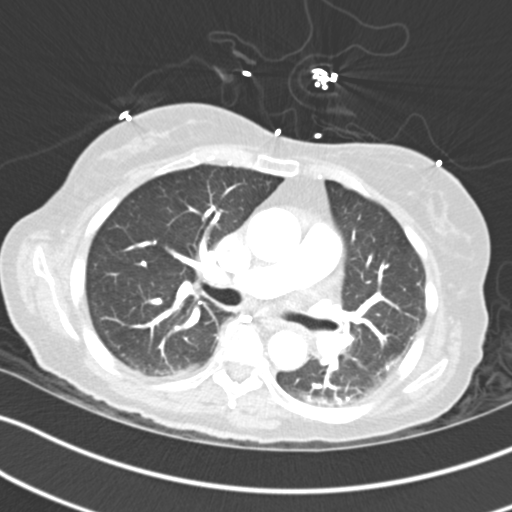
[im 163/192  soft-tissue]
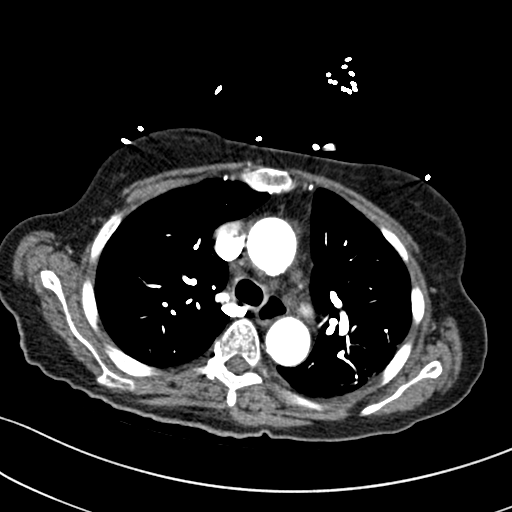
[im 172/192  lung]
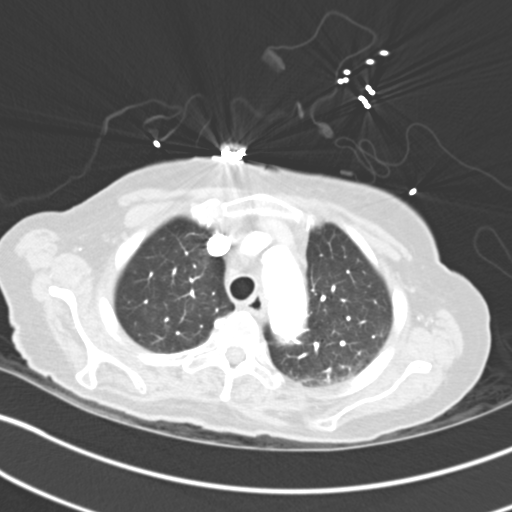
[im 182/192  soft-tissue]
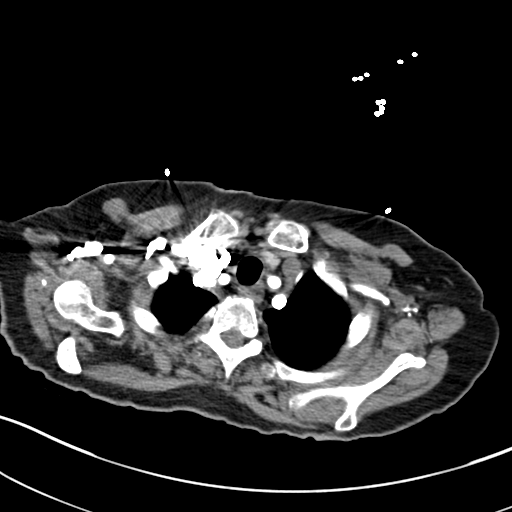

[18 of 32 positions shown; findings below may reference images not displayed]

FINDINGS: CTA CHEST FINDINGS

Mediastinum: The heart size is normal. There is a small to moderate
pericardial effusion identified. There is calcified atherosclerotic
disease involving the thoracic aorta. No evidence for 8 dissection.
The main pulmonary artery appears patent there is no abnormal
filling defects within the main pulmonary artery or its branches.

Lungs/Pleura: There is atelectasis/ scarring identified within the
lung bases. A calcified granuloma is noted in the left base. No
airspace consolidation.

Musculoskeletal: There is mild spondylosis identified within the
thoracic spine.

Review of the MIP images confirms the above findings.

CTA ABDOMEN AND PELVIS FINDINGS

Hepatobiliary: Normal appearance of the liver. There is diffuse
gallbladder wall thickening measuring up to 8 mm. There is no
biliary dilatation.

Pancreas: Normal appearance of the pancreas.

Spleen: The spleen is unremarkable.

Adrenals/Urinary Tract: The adrenal glands are both normal. There is
mild bilateral renal cortical thinning. The urinary bladder is
within normal limits.

Stomach/Bowel: Small hiatal hernia. The stomach is otherwise
unremarkable. The small bowel loops have a normal course and caliber
without obstruction. The appendix is visualized and appears normal.
Multiple distal colonic diverticula identified without acute
inflammation.

Vascular/Lymphatic: There is calcified atherosclerotic disease
involving the normal caliber abdominal aorta. There is no
dissection. No retroperitoneal adenopathy.

Reproductive: Previous hysterectomy. The adnexal structures appear
normal.

Other: There is no free fluid or fluid collections within the
abdomen or pelvis.

Musculoskeletal: Review of the visualized osseous structures is
significant for degenerative disc disease within the L4-5 level.

Review of the MIP images confirms the above findings.
IMPRESSION: 1. No evidence for aortic dissection.
2. Atherosclerotic disease.
3. Gallbladder wall thickening. If there are clinical signs or
symptoms of cholecystitis consider further investigation with
abdominal sonogram.
4. Hiatal hernia
5. Thoracic and lumbar degenerative disc disease.

## 2016-06-06 NOTE — Telephone Encounter (Signed)
Correct-no ibuprofen or any nsaid due to coumadin  Tylenol is ok , or if the dentist wants to px a narcotic or tramadol that would be up to her pain tolerance/pain level  Thanks for checking on that

## 2016-06-06 NOTE — Telephone Encounter (Signed)
Pt's daughter [name] notified of Dr. Marliss Coots comments

## 2016-06-06 NOTE — Telephone Encounter (Signed)
Lugene (DPR signed) said pt had tooth extracted this morning and pt was advised to take ibuprofen; Lugene did not think pt should take ibuprofen since taking coumadin. Can pt take ibuprofen and if not what should pt take. [employer]. Lugene request cb.

## 2016-06-07 NOTE — Telephone Encounter (Signed)
I think that would be fine, would continue warfarin

## 2016-06-08 NOTE — Telephone Encounter (Signed)
Tried calling office of Dr. Kenton Kingfisher and they were not open. Called patient and she states that they already pulled her tooth and they instructed her to take medications as directed. She did not stop or hold her coumadin. She denied any problems at this time and had no further questions. Will try to call office again after 2:15 when they reopen.

## 2016-06-20 ENCOUNTER — Other Ambulatory Visit: Payer: Commercial Managed Care - HMO

## 2016-06-20 ENCOUNTER — Ambulatory Visit: Payer: Commercial Managed Care - HMO | Admitting: Cardiovascular Disease

## 2016-06-26 ENCOUNTER — Other Ambulatory Visit: Payer: Self-pay | Admitting: *Deleted

## 2016-06-26 MED ORDER — SITAGLIPTIN PHOSPHATE 100 MG PO TABS: 100.0000 mg | ORAL_TABLET | Freq: Every day | ORAL | 2 refills | Status: DC

## 2016-07-06 ENCOUNTER — Other Ambulatory Visit (INDEPENDENT_AMBULATORY_CARE_PROVIDER_SITE_OTHER): Payer: Commercial Managed Care - HMO

## 2016-07-06 DIAGNOSIS — I4891 Unspecified atrial fibrillation: Secondary | ICD-10-CM

## 2016-07-06 DIAGNOSIS — Z23 Encounter for immunization: Secondary | ICD-10-CM

## 2016-07-06 DIAGNOSIS — Z5181 Encounter for therapeutic drug level monitoring: Secondary | ICD-10-CM | POA: Diagnosis not present

## 2016-07-06 DIAGNOSIS — I4892 Unspecified atrial flutter: Secondary | ICD-10-CM

## 2016-07-06 LAB — POCT INR: INR: 2.3

## 2016-07-07 NOTE — Addendum Note (Signed)
Addended by: Josetta Huddle on: 07/07/2016 08:41 AM   Modules accepted: Orders

## 2016-07-10 ENCOUNTER — Other Ambulatory Visit: Payer: Self-pay | Admitting: *Deleted

## 2016-07-10 ENCOUNTER — Other Ambulatory Visit: Payer: Self-pay | Admitting: Cardiovascular Disease

## 2016-07-10 MED ORDER — FUROSEMIDE 40 MG PO TABS
40.0000 mg | ORAL_TABLET | Freq: Every day | ORAL | 2 refills | Status: DC
Start: 2016-07-10 — End: 2017-09-24

## 2016-07-10 MED ORDER — GLIPIZIDE ER 2.5 MG PO TB24: 2.5000 mg | ORAL_TABLET | Freq: Every day | ORAL | 1 refills | Status: DC

## 2016-07-10 MED ORDER — HYDRALAZINE HCL 100 MG PO TABS: 100.0000 mg | ORAL_TABLET | Freq: Three times a day (TID) | ORAL | 0 refills | Status: DC

## 2016-07-24 ENCOUNTER — Other Ambulatory Visit: Payer: Self-pay | Admitting: *Deleted

## 2016-07-24 DIAGNOSIS — H61001 Unspecified perichondritis of right external ear: Secondary | ICD-10-CM | POA: Diagnosis not present

## 2016-07-24 MED ORDER — SITAGLIPTIN PHOSPHATE 100 MG PO TABS: 100.0000 mg | ORAL_TABLET | Freq: Every day | ORAL | 1 refills | Status: DC

## 2016-07-25 ENCOUNTER — Telehealth: Payer: Self-pay | Admitting: Family Medicine

## 2016-07-25 DIAGNOSIS — E559 Vitamin D deficiency, unspecified: Secondary | ICD-10-CM

## 2016-07-25 DIAGNOSIS — E7800 Pure hypercholesterolemia, unspecified: Secondary | ICD-10-CM

## 2016-07-25 DIAGNOSIS — D518 Other vitamin B12 deficiency anemias: Secondary | ICD-10-CM

## 2016-07-25 DIAGNOSIS — E1159 Type 2 diabetes mellitus with other circulatory complications: Secondary | ICD-10-CM

## 2016-07-25 DIAGNOSIS — I10 Essential (primary) hypertension: Secondary | ICD-10-CM

## 2016-07-25 NOTE — Telephone Encounter (Signed)
-----   Message from Josetta Huddle, [place] sent at 07/25/2016  8:57 AM EST ----- Regarding: Lab orders Mom is due for labwork on 08/01/16.  Can you please go ahead and put in lab orders so that I can get it in this week and if it includes an A1c, she won't have to do labs at [facility] for Dr. Gabriel Carina for that upcoming appt.  Thank you so much.

## 2016-07-31 ENCOUNTER — Encounter: Payer: Self-pay | Admitting: Family Medicine

## 2016-07-31 ENCOUNTER — Telehealth: Payer: Self-pay | Admitting: Family Medicine

## 2016-07-31 DIAGNOSIS — E1159 Type 2 diabetes mellitus with other circulatory complications: Secondary | ICD-10-CM

## 2016-07-31 NOTE — Telephone Encounter (Signed)
Future lab order for microalbumin

## 2016-08-01 ENCOUNTER — Other Ambulatory Visit (INDEPENDENT_AMBULATORY_CARE_PROVIDER_SITE_OTHER): Payer: Commercial Managed Care - HMO

## 2016-08-01 DIAGNOSIS — E559 Vitamin D deficiency, unspecified: Secondary | ICD-10-CM

## 2016-08-01 DIAGNOSIS — E1159 Type 2 diabetes mellitus with other circulatory complications: Secondary | ICD-10-CM | POA: Diagnosis not present

## 2016-08-01 DIAGNOSIS — I10 Essential (primary) hypertension: Secondary | ICD-10-CM | POA: Diagnosis not present

## 2016-08-01 DIAGNOSIS — D518 Other vitamin B12 deficiency anemias: Secondary | ICD-10-CM

## 2016-08-01 DIAGNOSIS — E7800 Pure hypercholesterolemia, unspecified: Secondary | ICD-10-CM

## 2016-08-01 LAB — COMPREHENSIVE METABOLIC PANEL
ALBUMIN: 4.1 g/dL (ref 3.5–5.2)
ALT: 44 U/L — ABNORMAL HIGH (ref 0–35)
AST: 52 U/L — AB (ref 0–37)
Alkaline Phosphatase: 55 U/L (ref 39–117)
BUN: 17 mg/dL (ref 6–23)
CHLORIDE: 102 meq/L (ref 96–112)
CO2: 28 meq/L (ref 19–32)
CREATININE: 0.96 mg/dL (ref 0.40–1.20)
Calcium: 9.8 mg/dL (ref 8.4–10.5)
GFR: 58.3 mL/min — ABNORMAL LOW (ref >60.00)
GLUCOSE: 93 mg/dL (ref 70–99)
POTASSIUM: 4.8 meq/L (ref 3.5–5.1)
SODIUM: 138 meq/L (ref 135–145)
Total Bilirubin: 0.6 mg/dL (ref 0.2–1.2)
Total Protein: 7.3 g/dL (ref 6.0–8.3)

## 2016-08-01 LAB — CBC WITH DIFFERENTIAL/PLATELET
BASOS PCT: 0.5 % (ref 0.0–3.0)
Basophils Absolute: 0 10*3/uL (ref 0.0–0.1)
EOS ABS: 0.1 10*3/uL (ref 0.0–0.7)
EOS PCT: 1 % (ref 0.0–5.0)
HCT: 41 % (ref 36.0–46.0)
HEMOGLOBIN: 13.5 g/dL (ref 12.0–15.0)
LYMPHS ABS: 2.4 10*3/uL (ref 0.7–4.0)
Lymphocytes Relative: 37.6 % (ref 12.0–46.0)
MCHC: 33 g/dL (ref 30.0–36.0)
MCV: 89 fl (ref 78.0–100.0)
MONO ABS: 0.7 10*3/uL (ref 0.1–1.0)
Monocytes Relative: 10.7 % (ref 3.0–12.0)
NEUTROS PCT: 50.2 % (ref 43.0–77.0)
Neutro Abs: 3.2 10*3/uL (ref 1.4–7.7)
PLATELETS: 222 10*3/uL (ref 150.0–400.0)
RBC: 4.6 Mil/uL (ref 3.87–5.11)
RDW: 13.9 % (ref 11.5–15.5)
WBC: 6.3 10*3/uL (ref 4.0–10.5)

## 2016-08-01 LAB — LIPID PANEL
CHOL/HDL RATIO: 5
Cholesterol: 236 mg/dL — ABNORMAL HIGH (ref 0–200)
HDL: 49.4 mg/dL (ref >39.00)
NONHDL: 186.82
Triglycerides: 201 mg/dL — ABNORMAL HIGH (ref 0.0–149.0)
VLDL: 40.2 mg/dL — AB (ref 0.0–40.0)

## 2016-08-01 LAB — HEMOGLOBIN A1C: Hgb A1c MFr Bld: 6 % (ref 4.6–6.5)

## 2016-08-01 LAB — TSH: TSH: 4.76 u[IU]/mL — AB (ref 0.35–4.50)

## 2016-08-01 LAB — VITAMIN D 25 HYDROXY (VIT D DEFICIENCY, FRACTURES): VITD: 52.73 ng/mL (ref 30.00–100.00)

## 2016-08-01 LAB — VITAMIN B12

## 2016-08-01 LAB — LDL CHOLESTEROL, DIRECT: Direct LDL: 168 mg/dL

## 2016-08-02 ENCOUNTER — Ambulatory Visit (INDEPENDENT_AMBULATORY_CARE_PROVIDER_SITE_OTHER): Payer: Commercial Managed Care - HMO | Admitting: Family Medicine

## 2016-08-02 ENCOUNTER — Encounter: Payer: Self-pay | Admitting: Family Medicine

## 2016-08-02 ENCOUNTER — Ambulatory Visit (INDEPENDENT_AMBULATORY_CARE_PROVIDER_SITE_OTHER): Payer: Commercial Managed Care - HMO

## 2016-08-02 VITALS — BP 142/72 | HR 74 | Temp 98.2°F | Ht 61.0 in | Wt 118.0 lb

## 2016-08-02 DIAGNOSIS — I4892 Unspecified atrial flutter: Secondary | ICD-10-CM

## 2016-08-02 DIAGNOSIS — Z5181 Encounter for therapeutic drug level monitoring: Secondary | ICD-10-CM

## 2016-08-02 DIAGNOSIS — I10 Essential (primary) hypertension: Secondary | ICD-10-CM | POA: Diagnosis not present

## 2016-08-02 DIAGNOSIS — I5032 Chronic diastolic (congestive) heart failure: Secondary | ICD-10-CM

## 2016-08-02 DIAGNOSIS — R7401 Elevation of levels of liver transaminase levels: Secondary | ICD-10-CM

## 2016-08-02 DIAGNOSIS — Z0000 Encounter for general adult medical examination without abnormal findings: Secondary | ICD-10-CM | POA: Diagnosis not present

## 2016-08-02 DIAGNOSIS — I4891 Unspecified atrial fibrillation: Secondary | ICD-10-CM

## 2016-08-02 DIAGNOSIS — E1165 Type 2 diabetes mellitus with hyperglycemia: Secondary | ICD-10-CM | POA: Diagnosis not present

## 2016-08-02 DIAGNOSIS — H9193 Unspecified hearing loss, bilateral: Secondary | ICD-10-CM | POA: Diagnosis not present

## 2016-08-02 DIAGNOSIS — E1159 Type 2 diabetes mellitus with other circulatory complications: Secondary | ICD-10-CM | POA: Diagnosis not present

## 2016-08-02 DIAGNOSIS — D518 Other vitamin B12 deficiency anemias: Secondary | ICD-10-CM

## 2016-08-02 DIAGNOSIS — R7989 Other specified abnormal findings of blood chemistry: Secondary | ICD-10-CM | POA: Diagnosis not present

## 2016-08-02 DIAGNOSIS — R740 Nonspecific elevation of levels of transaminase and lactic acid dehydrogenase [LDH]: Secondary | ICD-10-CM

## 2016-08-02 DIAGNOSIS — E7800 Pure hypercholesterolemia, unspecified: Secondary | ICD-10-CM

## 2016-08-02 DIAGNOSIS — E782 Mixed hyperlipidemia: Secondary | ICD-10-CM | POA: Diagnosis not present

## 2016-08-02 DIAGNOSIS — E559 Vitamin D deficiency, unspecified: Secondary | ICD-10-CM

## 2016-08-02 DIAGNOSIS — M810 Age-related osteoporosis without current pathological fracture: Secondary | ICD-10-CM

## 2016-08-02 LAB — MICROALBUMIN / CREATININE URINE RATIO
Creatinine,U: 45.2 mg/dL
Microalb Creat Ratio: 2.4 mg/g (ref 0.0–30.0)
Microalb, Ur: 1.1 mg/dL (ref 0.0–1.9)

## 2016-08-02 LAB — POCT INR: INR: 1.4

## 2016-08-02 MED ORDER — LEVOTHYROXINE SODIUM 25 MCG PO TABS: 25.0000 ug | ORAL_TABLET | Freq: Every day | ORAL | 3 refills | Status: DC

## 2016-08-02 MED ORDER — DOXAZOSIN MESYLATE 4 MG PO TABS: 4.0000 mg | ORAL_TABLET | Freq: Two times a day (BID) | ORAL | 3 refills | Status: DC

## 2016-08-02 MED ORDER — HYDRALAZINE HCL 100 MG PO TABS: 100.0000 mg | ORAL_TABLET | Freq: Three times a day (TID) | ORAL | 3 refills | Status: DC

## 2016-08-02 NOTE — Progress Notes (Signed)
Subjective:    Patient ID: Bianca Mueller, female    DOB: Oct 17, 1925, 82 y.o.   MRN: [id number]  HPI  I have personally reviewed the Medicare Annual Wellness questionnaire and have noted 1. The patient's medical and social history 2. Their use of alcohol, tobacco or illicit drugs 3. Their current medications and supplements 4. The patient's functional ability including ADL's, fall risks, home safety risks and hearing or visual             impairment. 5. Diet and physical activities 6. Evidence for depression or mood disorders  The patients weight, height, BMI have been recorded in the chart and visual acuity is per eye clinic.  I have made referrals, counseling and provided education to the patient based review of the above and I have provided the pt with a written personalized care plan for preventive services. Reviewed and updated provider list, see scanned forms.  See scanned forms.  Routine anticipatory guidance given to patient.  See health maintenance. Colon cancer screening-colonoscopy 2010 with polyp/adenoma Eye exam 8/16- she will make that appt (a little more blurry with reading) Failed hearing exam- feels funny in her ears , noticing hearing loss bilat  Breast cancer screening mammogram 11/15-normal - thinks she would like to get one  Self breast exam-no lumps or changes  Flu vaccine 07/06/16 dexa 6/11-OP - declines another dexa for now  Last falls were over a year ago  No fractures  Taking her vit D  Vit D level is 52.7 Walks in the house for exercise and also some PT exercises  Intolerant of alendronate    Tetanus vaccine 12/06 Pneumovax-complete Zoster vaccine 7/11  Advance directive-yes for living will and POA  Cognitive function addressed- see scanned forms- and if abnormal then additional documentation follows. -pretty good memory overall, does not misplace things , is very mentally sharp  She keeps up with schedule and appointments etc  PMH and SH  reviewed  Meds, vitals, and allergies reviewed.   ROS: See HPI.  Otherwise negative.    bp is stable today  No cp or palpitations or headaches or edema  No side effects to medicines  BP Readings from Last 3 Encounters:  08/02/16 (!) 148/76  05/16/16 122/80  04/17/16 (!) 182/90    It was better in cardiology office  Of note-takes lisinopril at lunch and has not had it yet     Wt Readings from Last 3 Encounters:  08/02/16 118 lb (53.5 kg)  05/16/16 113 lb 8 oz (51.5 kg)  04/17/16 112 lb (50.8 kg)  doing better/ appetite is good and blood sugar is well controlled  Not worried about nutritional status  bmi is 22.3   DM2 Lab Results  Component Value Date   HGBA1C 6.0 08/01/2016   Sees Dr Gabriel Carina for this -doing much better Pending microalbumin  Hx of hyperlipidemia Lab Results  Component Value Date   CHOL 236 (H) 08/01/2016   CHOL 172 01/04/2016   CHOL 150 08/24/2015   Lab Results  Component Value Date   HDL 49.40 08/01/2016   HDL 44.80 01/04/2016   HDL 34.10 (L) 08/24/2015   Lab Results  Component Value Date   LDLCALC 96 01/04/2016   LDLCALC 77 08/24/2015   LDLCALC 41 07/19/2014   Lab Results  Component Value Date   TRIG 201.0 (H) 08/01/2016   TRIG 156.0 (H) 01/04/2016   TRIG 196.0 (H) 08/24/2015   Lab Results  Component Value Date   CHOLHDL  5 08/01/2016   CHOLHDL 4 01/04/2016   CHOLHDL 4 08/24/2015   Lab Results  Component Value Date   LDLDIRECT 168.0 08/01/2016   LDLDIRECT 102.0 11/16/2014   LDLDIRECT 95.1 08/18/2013  she will wait to disc with cardiology    Thought her pravastatin was inc her lft Lab Results  Component Value Date   ALT 44 (H) 08/01/2016   AST 52 (H) 08/01/2016   ALKPHOS 55 08/01/2016   BILITOT 0.6 08/01/2016   this has improved   Hx of B12 def Lab Results  Component Value Date   VITAMINB12 >1500 (H) 08/01/2016   Hypothyroidism  Pt has no clinical changes No change in energy level/ hair or skin/ edema and no  tremor Lab Results  Component Value Date   TSH 4.76 (H) 08/01/2016    Wants to stay on the same dose    Patient Active Problem List   Diagnosis Date Noted  . Hypokalemia 11/08/2015  . Anxiety 11/08/2015  . Weakness 10/31/2015  . Contusion   . Syncope 09/21/2015  . Bradycardia 09/21/2015  . Gait disorder 09/21/2015  . History of fall 08/24/2015  . Hearing loss 08/24/2015  . Chest wall contusion 08/24/2015  . Cough 02/26/2015  . Lightheaded 11/06/2014  . Leukocytosis 08/07/2014  . Acute on chronic diastolic heart failure ([place]) 08/07/2014  . Encounter for therapeutic drug monitoring 08/06/2014  . Chronic diastolic CHF (congestive heart failure) ([place])   . CAD (coronary artery disease)   . Type II diabetes mellitus ([place])   . Hyperlipidemia   . Acute on chronic diastolic CHF (congestive heart failure), NYHA class 3 ([place]) 07/27/2014  . Atrial fibrillation ([place])   . Nausea without vomiting   . Transaminitis 07/20/2014  . Atrial flutter ([place]) 07/20/2014  . NSTEMI (non-ST elevated myocardial infarction) ([place]) 07/18/2014  . Dyslipidemia 07/18/2014  . Leg cramps 02/17/2014  . Encounter for Medicare annual wellness exam 08/19/2013  . Skin lesion 03/16/2013  . Other screening mammogram 05/17/2012  . Adult failure to thrive 05/17/2011  . Palpitations 02/16/2011  . DM2 (diabetes mellitus, type 2) ([place]) 02/14/2011  . ANEMIA, VITAMIN B12 DEFICIENCY 09/26/2010  . PEDAL EDEMA 03/16/2010  . [place] [facility] 09/06/2009  . Vitamin D deficiency 03/02/2009  . DIVERTICULITIS, COLON 11/12/2008  . COLONIC POLYPS, HX OF 09/02/2008  . HYPERCHOLESTEROLEMIA 12/10/2006  . Essential hypertension 12/10/2006  . Osteoporosis 03/18/2002   Past Medical History:  Diagnosis Date  . Arthritis    "hips" (08/07/2014)  . CAD (coronary artery disease)    a. 1989 s/p MI, TPA, PTCA;  b. 06/2014 NSTEMI/Cath: LM nl, LAD 30-40, D2 90, small, LCX 163m OM1 90, RCA Cs2+ 70-80p, 50-633m90d (? ruptured plaque),  EF 55-60%-->Med Rx.  . Chronic diastolic CHF (congestive heart failure) ([place]   a. 07/2014 Echo: EF 60-65%, Gr 1 DD, mild to mod TR, PASP 37110m.  . Diverticulosis   . Elevated LFTs    a. 06/2014 Abd U/S: no GB pathology.  . GMarland KitchenRD (gastroesophageal reflux disease)   . Hx of adenomatous colonic polyps 2010   last colonscopy 2010,Dr EllVira Agarno need for further  colonoscopy given age  . Hypercalcemia   . Hyperlipidemia   . Hypertension   . Myocardial infarction 1989  . Osteoporosis   . PAF/PAFlutter    a. 06/2014->amio and coumadin started.  . Type II diabetes mellitus ([place] . Zoster    past   Past Surgical History:  Procedure Laterality Date  . CARDIAC CATHETERIZATION  07/22/2014  . CORONARY ANGIOPLASTY  1989   "@ [facility]"  . LEFT HEART CATHETERIZATION WITH CORONARY ANGIOGRAM N/A 07/22/2014   Procedure: LEFT HEART CATHETERIZATION WITH CORONARY ANGIOGRAM;  Surgeon: Sinclair Grooms, MD;  Location[facility] CATH LAB;  Service: Cardiovascular;  Laterality: N/A;  . SHOULDER OPEN ROTATOR CUFF REPAIR Right 1980's  . VAGINAL HYSTERECTOMY  1970's?   ovaries intact   Social History  Substance Use Topics  . Smoking status: Former Smoker    Packs/day: 0.25    Years: 30.00    Types: Cigarettes  . Smokeless tobacco: Never Used     Comment: "quit smoking in the 1980's"  . Alcohol use No   Family History  Problem Relation Age of Onset  . Heart disease Mother     CAD  . Heart disease Father     CAD  . Heart disease Brother     CAD   Allergies  Allergen Reactions  . Alendronate Sodium Other (See Comments)    unknown  . Doxycycline Other (See Comments)    Inc LFT  . Isosorbide Nitrate Other (See Comments)  . Lidocaine Nausea Only    Given at Dentist office  . Metformin And Related     Decreases appetite resulting in weight loss  . Metronidazole Nausea And Vomiting    REACTION: sick/ GI  . Nifedipine Other (See Comments)   Current Outpatient Prescriptions on File Prior to Visit   Medication Sig Dispense Refill  . ACCU-CHEK AVIVA PLUS test strip USE AS INSTRUCTED TO CHECK BLOOD SUGAR ONCE DAILY OR AS NEEDED.   100 each 4  . ACCU-CHEK FASTCLIX LANCETS MISC Use as instructed to test blood sugar once daily or as needed.  Diagnosis:  E11.9   Non insulin-dependent. 102 each 4  . amiodarone (PACERONE) 200 MG tablet Take 1 tablet (200 mg total) by mouth daily. 180 tablet 3  . Blood Glucose Monitoring Suppl (ACCU-CHEK AVIVA PLUS) W/DEVICE KIT Use as instructed to test blood sugar once daily or as needed.  Diagnosis:  E11.9  Non insulin-dependent. 1 kit 0  . busPIRone (BUSPAR) 15 MG tablet Take 0.5 tablets (7.5 mg total) by mouth 2 (two) times daily. 90 tablet 3  . cholecalciferol (VITAMIN D) 1000 UNITS tablet Take 2,000 Units by mouth daily at 12 noon.     . cloNIDine (CATAPRES) 0.2 MG tablet Take 1 tablet (0.2 mg total) by mouth 2 (two) times daily. 180 tablet 3  . docusate sodium 100 MG CAPS Take 100 mg by mouth 2 (two) times daily. 10 capsule 0  . furosemide (LASIX) 40 MG tablet Take 1 tablet (40 mg total) by mouth daily. As directed by cardiologist, Pt may take additional doses as needed 180 tablet 2  . glipiZIDE (GLUCOTROL XL) 2.5 MG 24 hr tablet Take 1 tablet (2.5 mg total) by mouth daily with lunch. 90 tablet 1  . lisinopril (PRINIVIL,ZESTRIL) 20 MG tablet TAKE 1 TABLET EVERY DAY AT NOON 30 tablet 0  . metoprolol tartrate (LOPRESSOR) 25 MG tablet TAKE 1 TABLET TWICE DAILY 180 tablet 3  . nitroGLYCERIN (NITROSTAT) 0.4 MG SL tablet Place 1 tablet (0.4 mg total) under the tongue every 5 (five) minutes as needed. For chest pain and if not improved seek medical attention in ER. 15 tablet 0  . omeprazole (PRILOSEC) 20 MG capsule Take 20 mg by mouth daily.    . sitaGLIPtin (JANUVIA) 100 MG tablet Take 1 tablet (100 mg total) by mouth daily. 30 tablet 1  .  vitamin B-12 (CYANOCOBALAMIN) 1000 MCG tablet Take 1,000 mcg by mouth every other day.     . warfarin (COUMADIN) 3 MG tablet  Take as directed by anti-coagulation clinic. 70 tablet 3   No current facility-administered medications on file prior to visit.     Review of Systems    Review of Systems  Constitutional: Negative for fever, appetite change, fatigue and unexpected weight change.  Eyes: Negative for pain and visual disturbance.  Respiratory: Negative for cough and shortness of breath.   Cardiovascular: Negative for cp or palpitations    Gastrointestinal: Negative for nausea, diarrhea and constipation.  Genitourinary: Negative for urgency and frequency.  Skin: Negative for pallor or rash   MSK pos for joint stiffness and aches and pains  Neurological: Negative for weakness, light-headedness, numbness and headaches.  Hematological: Negative for adenopathy. Does not bruise/bleed easily.  Psychiatric/Behavioral: Negative for dysphoric mood. The patient is not nervous/anxious.      Objective:   Physical Exam  Constitutional: She appears well-developed and well-nourished. No distress.  Well appearing elderly female  HENT:  Head: Normocephalic and atraumatic.  Right Ear: External ear normal.  Left Ear: External ear normal.  Mouth/Throat: Oropharynx is clear and moist.  Eyes: Conjunctivae and EOM are normal. Pupils are equal, round, and reactive to light. No scleral icterus.  Neck: Normal range of motion. Neck supple. No JVD present. Carotid bruit is not present. No thyromegaly present.  Cardiovascular: Normal rate, regular rhythm, normal heart sounds and intact distal pulses.  Exam reveals no gallop.   Pulmonary/Chest: Effort normal and breath sounds normal. No respiratory distress. She has no wheezes. She exhibits no tenderness.  Abdominal: Soft. Bowel sounds are normal. She exhibits no distension, no abdominal bruit and no mass. There is no tenderness.  Genitourinary: No breast swelling, tenderness, discharge or bleeding.  Genitourinary Comments: Breast exam: No mass, nodules, thickening, tenderness,  bulging, retraction, inflamation, nipple discharge or skin changes noted.  No axillary or clavicular LA.      Musculoskeletal: Normal range of motion. She exhibits no edema or tenderness.  Lymphadenopathy:    She has no cervical adenopathy.  Neurological: She is alert. She has normal reflexes. No cranial nerve deficit. She exhibits normal muscle tone. Coordination normal.  Skin: Skin is warm and dry. No rash noted. No erythema. No pallor.  sk and lentigines diffusely  Psychiatric: She has a normal mood and affect.  Mentally sharp          Assessment & Plan:   Problem List Items Addressed This Visit      Cardiovascular and Mediastinum   Atrial fibrillation (HCC)    mt NSR now       Relevant Medications   hydrALAZINE (APRESOLINE) 100 MG tablet   doxazosin (CARDURA) 4 MG tablet   Chronic diastolic CHF (congestive heart failure) (HCC)    In good control currently  Followed by cardiology      Relevant Medications   hydrALAZINE (APRESOLINE) 100 MG tablet   doxazosin (CARDURA) 4 MG tablet   Essential hypertension - Primary    BP: (!) 142/72 this has been very difficult to control in the past and pt has not taken her mid day lisinopril today  Had a good reading at the cardiology office Will continue to watch at home  Labs reviewed       Relevant Medications   hydrALAZINE (APRESOLINE) 100 MG tablet   doxazosin (CARDURA) 4 MG tablet     Endocrine   DM2 (diabetes  mellitus, type 2) (HCC)     Nervous and Auditory   Hearing loss    Pt declines need for intervention at this time but notices it  Will let us know when she is ready for further eval and hearing augmentation if applicable        Musculoskeletal and Integument   Osteoporosis    Declines further dexa No fractures  Disc need for calcium/ vitamin D/ wt bearing exercise and bone density test every 2 y to monitor Disc safety/ fracture risk in detail          Other   ANEMIA, VITAMIN B12 DEFICIENCY    Lab  Results  Component Value Date   VITAMINB12 >1500 (H) 08/01/2016   Adv to cut her supplementation to every other day      Encounter for therapeutic drug monitoring    INR today      HYPERCHOLESTEROLEMIA    Disc goals for lipids and reasons to control them Rev labs with pt Rev low sat fat diet in detail This is elevated off statin  Pravastatin was inc her LFT- now much improved Disc opt of zetial or welchol- she will disc further with her cardiologist She does have vascular disease        Relevant Medications   hydrALAZINE (APRESOLINE) 100 MG tablet   doxazosin (CARDURA) 4 MG tablet   Routine general medical examination at a health care facility    Reviewed health habits including diet and exercise and skin cancer prevention Reviewed appropriate screening tests for age  Also reviewed health mt list, fam hx and immunization status , as well as social and family history   See HPI Labs reviewed AMW done  Don't forget to make your eye exam appt. Don't forget to schedule your mammogram  See the handout regarding tetanus vaccine  Go ahead and cut B12 to every other day  Talk to cardiology about the cholesterol  Liver tests are better off the statin  Stay social Stay physically active in a safe environment       Transaminitis    Much improved off of statin medication Liver US was nl       Vitamin D deficiency    D level is therapeutic in the 50s  Disc imp to bone and overall health

## 2016-08-02 NOTE — Addendum Note (Signed)
Addended by: Marchia Bond on: 08/02/2016 03:13 PM   Modules accepted: Orders

## 2016-08-02 NOTE — Progress Notes (Signed)
Pre visit review using our clinic review tool, if applicable. No additional management support is needed unless otherwise documented below in the visit note. 

## 2016-08-02 NOTE — Patient Instructions (Addendum)
Don't forget to make your eye exam appt. Don't forget to schedule your mammogram  See the handout regarding tetanus vaccine  Go ahead and cut B12 to every other day  Talk to cardiology about the cholesterol  Liver tests are better off the statin  Stay social Stay physically active in a safe environment

## 2016-08-02 NOTE — Patient Instructions (Signed)
Pre visit review using our clinic review tool, if applicable. No additional management support is needed unless otherwise documented below in the visit note.  INR today: 1.4  Patient denies any missed doses or changes in diet or medications.  Will boost by 1/2 dose today and tomorrow to have patient take 1 whole (3mg ) pill today and 1 1/2 (4.5mg ) tomorrow then resume normal dosing and recheck her in 1 week on Tuesday, 08/08/16.  Daughter, [name], present and will be sure mom knows how to take.  Patient and family educated on risks of subtherapeutic level and verbalizes understanding.

## 2016-08-03 DIAGNOSIS — Z0000 Encounter for general adult medical examination without abnormal findings: Secondary | ICD-10-CM | POA: Insufficient documentation

## 2016-08-03 NOTE — Assessment & Plan Note (Signed)
INR today.

## 2016-08-03 NOTE — Assessment & Plan Note (Signed)
Declines further dexa No fractures  Disc need for calcium/ vitamin D/ wt bearing exercise and bone density test every 2 y to monitor Disc safety/ fracture risk in detail

## 2016-08-03 NOTE — Addendum Note (Signed)
Addended by: Magdalen Spatz C on: 08/03/2016 09:27 AM   Modules accepted: Miquel Dunn

## 2016-08-03 NOTE — Assessment & Plan Note (Signed)
Disc goals for lipids and reasons to control them Rev labs with pt Rev low sat fat diet in detail This is elevated off statin  Pravastatin was inc her LFT- now much improved Disc opt of zetial or welchol- she will disc further with her cardiologist She does have vascular disease

## 2016-08-03 NOTE — Assessment & Plan Note (Signed)
D level is therapeutic in the 50s  Disc imp to bone and overall health

## 2016-08-03 NOTE — Assessment & Plan Note (Signed)
Lab Results  Component Value Date   VITAMINB12 >1500 (H) 08/01/2016   Adv to cut her supplementation to every other day

## 2016-08-03 NOTE — Assessment & Plan Note (Signed)
Much improved off of statin medication Liver US was nl

## 2016-08-03 NOTE — Assessment & Plan Note (Signed)
Reviewed health habits including diet and exercise and skin cancer prevention Reviewed appropriate screening tests for age  Also reviewed health mt list, fam hx and immunization status , as well as social and family history   See HPI Labs reviewed AMW done  Don't forget to make your eye exam appt. Don't forget to schedule your mammogram  See the handout regarding tetanus vaccine  Go ahead and cut B12 to every other day  Talk to cardiology about the cholesterol  Liver tests are better off the statin  Stay social Stay physically active in a safe environment

## 2016-08-03 NOTE — Assessment & Plan Note (Signed)
Reviewed health habits including diet and exercise and skin cancer prevention Reviewed appropriate screening tests for age  Also reviewed health mt list, fam hx and immunization status , as well as social and family history   See HPI Labs reviewed  

## 2016-08-03 NOTE — Assessment & Plan Note (Signed)
Pt declines need for intervention at this time but notices it  Will let us know when she is ready for further eval and hearing augmentation if applicable

## 2016-08-03 NOTE — Assessment & Plan Note (Signed)
mt NSR now

## 2016-08-03 NOTE — Assessment & Plan Note (Signed)
In good control currently  Followed by cardiology

## 2016-08-03 NOTE — Assessment & Plan Note (Signed)
BP: (!) 142/72 this has been very difficult to control in the past and pt has not taken her mid day lisinopril today  Had a good reading at the cardiology office Will continue to watch at home  Labs reviewed

## 2016-08-03 NOTE — Progress Notes (Signed)
This encounter was created in error - please disregard.

## 2016-08-08 ENCOUNTER — Ambulatory Visit (INDEPENDENT_AMBULATORY_CARE_PROVIDER_SITE_OTHER): Payer: Commercial Managed Care - HMO

## 2016-08-08 DIAGNOSIS — I4891 Unspecified atrial fibrillation: Secondary | ICD-10-CM

## 2016-08-08 DIAGNOSIS — Z5181 Encounter for therapeutic drug level monitoring: Secondary | ICD-10-CM

## 2016-08-08 LAB — POCT INR: INR: 2

## 2016-08-08 NOTE — Patient Instructions (Signed)
Pre visit review using our clinic review tool, if applicable. No additional management support is needed unless otherwise documented below in the visit note. 

## 2016-08-16 ENCOUNTER — Other Ambulatory Visit: Payer: Self-pay | Admitting: Family Medicine

## 2016-08-16 DIAGNOSIS — Z1231 Encounter for screening mammogram for malignant neoplasm of breast: Secondary | ICD-10-CM

## 2016-08-17 ENCOUNTER — Other Ambulatory Visit: Payer: Commercial Managed Care - HMO

## 2016-08-31 ENCOUNTER — Other Ambulatory Visit: Payer: Self-pay | Admitting: Family Medicine

## 2016-08-31 ENCOUNTER — Telehealth: Payer: Self-pay

## 2016-08-31 MED ORDER — ALPRAZOLAM 0.25 MG PO TABS: 0.1250 mg | ORAL_TABLET | Freq: Two times a day (BID) | ORAL | 0 refills | Status: DC | PRN

## 2016-08-31 NOTE — Progress Notes (Unsigned)
Please call in rx.  Thanks.  Use xanax instead of buspar if needed for anxiety.

## 2016-08-31 NOTE — Telephone Encounter (Signed)
Joelene Millin pts granddaughter said pts husband is at hospital and pt is very upset; Joelene Millin gave pt the second buspar 15 mg 1/2 tab about 1 1/2 early but pt is still very nervous and upset. Joelene Millin wants to know if pt could be given short acting med to help pt calm down during this time. [employer].

## 2016-08-31 NOTE — Progress Notes (Unsigned)
Bianca Mueller pts granddaughter said that pt is upset and nervous due to pts husband falling and being in ED. Pt usually takes buspar 15 mg 1/2 tab bid. Bianca Mueller has given pt the second 1/2 tab of buspar about 1 1/2 hour early and pt is still nervous and upset. Bianca Mueller wants to know if pt could be prescribed a short acting med to help pt calm down during this time. [place]. Dr Damita Dunnings will prescribe xanax and pt can take xanax instead of buspar if needed for anxiety.Bianca Mueller wanted to know since gave buspar about 2 hrs ago how long will she need to wait to give her xanax. Dr Damita Dunnings said OK to give 1/2 xanax today but if tomorrow pt still needs med for anxiety to give xanax instead of buspar. Bianca Mueller voiced understanding. Bianca Mueller will pick up med at [facility]. Medication phoned to Tammy at [facility] as instructed. FYI to Dr Damita Dunnings.

## 2016-09-05 ENCOUNTER — Ambulatory Visit (INDEPENDENT_AMBULATORY_CARE_PROVIDER_SITE_OTHER): Payer: Commercial Managed Care - HMO

## 2016-09-05 DIAGNOSIS — Z5181 Encounter for therapeutic drug level monitoring: Secondary | ICD-10-CM

## 2016-09-05 DIAGNOSIS — I4891 Unspecified atrial fibrillation: Secondary | ICD-10-CM

## 2016-09-05 LAB — POCT INR: INR: 1.9

## 2016-09-05 NOTE — Patient Instructions (Signed)
Pre visit review using our clinic review tool, if applicable. No additional management support is needed unless otherwise documented below in the visit note. 

## 2016-09-13 DIAGNOSIS — H35371 Puckering of macula, right eye: Secondary | ICD-10-CM | POA: Diagnosis not present

## 2016-09-15 ENCOUNTER — Telehealth: Payer: Self-pay | Admitting: *Deleted

## 2016-09-15 ENCOUNTER — Encounter: Payer: Self-pay | Admitting: Family Medicine

## 2016-09-15 NOTE — Telephone Encounter (Signed)
Erroneous encounter

## 2016-09-19 ENCOUNTER — Encounter: Payer: Self-pay | Admitting: Family Medicine

## 2016-09-20 ENCOUNTER — Ambulatory Visit: Payer: Commercial Managed Care - HMO

## 2016-09-22 DIAGNOSIS — H35371 Puckering of macula, right eye: Secondary | ICD-10-CM | POA: Diagnosis not present

## 2016-09-26 ENCOUNTER — Ambulatory Visit (INDEPENDENT_AMBULATORY_CARE_PROVIDER_SITE_OTHER): Payer: Commercial Managed Care - HMO

## 2016-09-26 DIAGNOSIS — I4891 Unspecified atrial fibrillation: Secondary | ICD-10-CM

## 2016-09-26 DIAGNOSIS — Z5181 Encounter for therapeutic drug level monitoring: Secondary | ICD-10-CM

## 2016-09-26 LAB — POCT INR: INR: 2.9

## 2016-09-26 NOTE — Patient Instructions (Signed)
Pre visit review using our clinic review tool, if applicable. No additional management support is needed unless otherwise documented below in the visit note. 

## 2016-10-22 ENCOUNTER — Other Ambulatory Visit: Payer: Self-pay | Admitting: Family Medicine

## 2016-10-22 DIAGNOSIS — I10 Essential (primary) hypertension: Secondary | ICD-10-CM

## 2016-10-23 NOTE — Telephone Encounter (Signed)
Last 2 refills were done by cardiology, please advise

## 2016-10-23 NOTE — Telephone Encounter (Signed)
done

## 2016-10-23 NOTE — Telephone Encounter (Signed)
That is fine Please refill for 6 mo

## 2016-10-24 ENCOUNTER — Ambulatory Visit (INDEPENDENT_AMBULATORY_CARE_PROVIDER_SITE_OTHER): Payer: Commercial Managed Care - HMO

## 2016-10-24 DIAGNOSIS — Z5181 Encounter for therapeutic drug level monitoring: Secondary | ICD-10-CM | POA: Diagnosis not present

## 2016-10-24 DIAGNOSIS — I4891 Unspecified atrial fibrillation: Secondary | ICD-10-CM

## 2016-10-24 LAB — POCT INR: INR: 2.3

## 2016-10-24 NOTE — Patient Instructions (Signed)
Pre visit review using our clinic review tool, if applicable. No additional management support is needed unless otherwise documented below in the visit note. 

## 2016-11-02 ENCOUNTER — Telehealth: Payer: Self-pay | Admitting: Cardiovascular Disease

## 2016-11-02 NOTE — Telephone Encounter (Signed)
S/w with Marzetta Board, grand-daughter.  Patient not with her at the moment as Marzetta Board is at [facility] with her father having open heart surgery. Patient is visiting another relative at another hospital and does not have a cell phone for me to call.  Stacy described patient has having indigestion just at night when she lays down. She describes it as "burning in her throat" when she lays down. Marzetta Board denies the pain being in patient's chest or between her breasts. Patient takes some Tums (5 tums) and it goes away.  It has occurred the last 3 nights and started one night after patient had fried some chicken and ate it. She has been fine throughout the day with no indigestion or other symptoms such as chest pain, SOB, palpitations, arm or jaw pain. Advised that this does not sound to be cardiac. However, Patient is due for her 6 month f/u. Appt scheduled for 11/07/16 with Dr Rockey Situ.  Advised that if symptoms worsen and patient develops other symptoms such as chest pain, left arm pain, jaw pain, nausea, vomiting, or sweating to call 911. She verbalized understanding.

## 2016-11-02 NOTE — Telephone Encounter (Signed)
Patient having indigestion and took nitro to make sure its not her heart. Patient not sure what to do and called her granddaughter, please call granddaughter. She is not having any other symptoms.

## 2016-11-03 ENCOUNTER — Other Ambulatory Visit: Payer: Self-pay

## 2016-11-03 MED ORDER — NITROGLYCERIN 0.4 MG SL SUBL: 0.4000 mg | SUBLINGUAL_TABLET | SUBLINGUAL | 1 refills | Status: DC | PRN

## 2016-11-03 NOTE — Telephone Encounter (Signed)
Px written for call in   

## 2016-11-03 NOTE — Telephone Encounter (Signed)
Rx sent electronically.  

## 2016-11-03 NOTE — Telephone Encounter (Signed)
Joelene Millin pts granddaughter left v/m requesting refill ntg to [name]. Pt not having symptoms now.  Last annual 08/02/16. Last refilled # 15 on 07/03/2013. Please see phone note to Dr Fletcher Anon on 11/02/16.Please advise.

## 2016-11-07 ENCOUNTER — Other Ambulatory Visit: Payer: Self-pay | Admitting: *Deleted

## 2016-11-07 ENCOUNTER — Ambulatory Visit: Payer: Commercial Managed Care - HMO | Admitting: Cardiovascular Disease

## 2016-11-07 ENCOUNTER — Encounter: Payer: Self-pay | Admitting: Family Medicine

## 2016-11-07 MED ORDER — ALPRAZOLAM 0.25 MG PO TABS: 0.1250 mg | ORAL_TABLET | Freq: Two times a day (BID) | ORAL | 1 refills | Status: DC | PRN

## 2016-11-07 NOTE — Telephone Encounter (Signed)
Pt had CPE on 08/02/16, last filled on 08/31/16 #20 tabs with 0 refills, please advise

## 2016-11-07 NOTE — Telephone Encounter (Signed)
Rx called in as prescribed 

## 2016-11-07 NOTE — Telephone Encounter (Signed)
Px written for call in   

## 2016-11-16 ENCOUNTER — Encounter: Payer: Self-pay | Admitting: Cardiovascular Disease

## 2016-11-16 ENCOUNTER — Ambulatory Visit (INDEPENDENT_AMBULATORY_CARE_PROVIDER_SITE_OTHER): Payer: Medicare HMO | Admitting: Cardiovascular Disease

## 2016-11-16 VITALS — BP 135/82 | HR 70 | Ht 62.0 in | Wt 119.8 lb

## 2016-11-16 DIAGNOSIS — I493 Ventricular premature depolarization: Secondary | ICD-10-CM

## 2016-11-16 DIAGNOSIS — I10 Essential (primary) hypertension: Secondary | ICD-10-CM

## 2016-11-16 DIAGNOSIS — R002 Palpitations: Secondary | ICD-10-CM | POA: Diagnosis not present

## 2016-11-16 DIAGNOSIS — E7800 Pure hypercholesterolemia, unspecified: Secondary | ICD-10-CM | POA: Diagnosis not present

## 2016-11-16 DIAGNOSIS — Z9181 History of falling: Secondary | ICD-10-CM

## 2016-11-16 DIAGNOSIS — I214 Non-ST elevation (NSTEMI) myocardial infarction: Secondary | ICD-10-CM

## 2016-11-16 DIAGNOSIS — R55 Syncope and collapse: Secondary | ICD-10-CM | POA: Diagnosis not present

## 2016-11-16 NOTE — Progress Notes (Signed)
Cardiology Office Note  Date:  11/16/2016   ID:  Bianca Mueller, DOB 07/22/1926, MRN [id number]  PCP:  Marne Tower, MD   Chief Complaint  Patient presents with  . other    6mo f/u. Pt states that her indigestion has eased off with diet change. Pt c/o sob with exertion. Reviewed meds with pt verbally.    HPI:  83-year-old female with history of non-ST elevation MI, three-vessel disease on catheterization in 2015,  complicated by paroxysmal atrial fibrillation and flutter who presents for follow-up of her coronary artery disease and atrial flutter. Prior non-ST elevation MI October 2015 History of labile hypertension On a prior clinic visit, she reported having episode of syncope.  30 day monitor was ordered, This showed no significant arrhythmia  In follow-up, she reports that she feels well She reports significant stress at home, husband was sitting on ATV both legs on one side as unable to step over He fell off the ATV broke both his legs, now in nursing home recovering He is having some sundowning issues  Continues to have trace ankle swelling, uses compression hose  She denies having significant chest pain She does report having some shortness of breath with long exertion Significant gait instability, difficulty getting off a table in the exam room without 2 person assistance Denies any recent falls Uses a walker, When she goes out, uses a cane even though she feels safer with her walker  EKG on today's visit shows normal sinus rhythm with rate 70 bpm, rare PVCs, no significant ST or T-wave changes  Other past medical history reviewed Previous Stress test was ordered that showed no ischemia.  Notes indicate previous fall with back injury, she denies having a fall   30 day monitor results  no significant arrhythmia, only normal sinus rhythm  She was started on ranexa on previous clinic visit, reports she is unable to afford this  may have helped, given her a little bit  more energy  She did have a fall, fell between the bed and dresser, hurt her head Feels it was from her balance  Previous fall as she was getting out of her car in the garage Feels her feet slipped, did not have good balance, went down and hurt her ribs Denies any loss of consciousness, near syncope or dizziness no further falls since that time Reports that her balance is okay, does not need a walker  hemoglobin A1c 7.4, total cholesterol 150  In the hospital she had atrial fibrillation/flutter with RVR, started on diltiazem and amiodarone cardiac catheterization showing severe diagonal, circumflex OM1, and RCA disease, medical management recommended Started on Coumadin and Plavix follow-up in clinic she was still in atrial flutter with rate 86 bpm, normal sinus rhythm on her last clinic visit  Previous 2-D echocardiogram showed normal LV function.   PMH:   has a past medical history of Arthritis; CAD (coronary artery disease); Chronic diastolic CHF (congestive heart failure) (HCC); Diverticulosis; Elevated LFTs; GERD (gastroesophageal reflux disease); adenomatous colonic polyps (2010); Hypercalcemia; Hyperlipidemia; Hypertension; Myocardial infarction (1989); Osteoporosis; PAF/PAFlutter; Type II diabetes mellitus (HCC); and Zoster.  PSH:    Past Surgical History:  Procedure Laterality Date  . CARDIAC CATHETERIZATION  07/22/2014  . CORONARY ANGIOPLASTY  1989   "@ [facility]"  . LEFT HEART CATHETERIZATION WITH CORONARY ANGIOGRAM N/A 07/22/2014   Procedure: LEFT HEART CATHETERIZATION WITH CORONARY ANGIOGRAM;  Surgeon: Henry W Smith III, MD;  Location: [facility] CATH LAB;  Service: Cardiovascular;  Laterality: N/A;  .   SHOULDER OPEN ROTATOR CUFF REPAIR Right 1980's  . VAGINAL HYSTERECTOMY  1970's?   ovaries intact    Current Outpatient Prescriptions  Medication Sig Dispense Refill  . ACCU-CHEK AVIVA PLUS test strip USE AS INSTRUCTED TO CHECK BLOOD SUGAR ONCE DAILY OR AS NEEDED.   100 each  4  . ACCU-CHEK FASTCLIX LANCETS MISC Use as instructed to test blood sugar once daily or as needed.  Diagnosis:  E11.9   Non insulin-dependent. 102 each 4  . ALPRAZolam (XANAX) 0.25 MG tablet Take 0.5-1 tablets (0.125-0.25 mg total) by mouth 2 (two) times daily as needed for anxiety (sedation caution). 30 tablet 1  . amiodarone (PACERONE) 200 MG tablet Take 1 tablet (200 mg total) by mouth daily. 180 tablet 3  . Blood Glucose Monitoring Suppl (ACCU-CHEK AVIVA PLUS) W/DEVICE KIT Use as instructed to test blood sugar once daily or as needed.  Diagnosis:  E11.9  Non insulin-dependent. 1 kit 0  . busPIRone (BUSPAR) 15 MG tablet Take 0.5 tablets (7.5 mg total) by mouth 2 (two) times daily. 90 tablet 3  . cholecalciferol (VITAMIN D) 1000 UNITS tablet Take 2,000 Units by mouth daily at 12 noon.     . cloNIDine (CATAPRES) 0.2 MG tablet Take 1 tablet (0.2 mg total) by mouth 2 (two) times daily. 180 tablet 3  . docusate sodium 100 MG CAPS Take 100 mg by mouth 2 (two) times daily. 10 capsule 0  . doxazosin (CARDURA) 4 MG tablet Take 1 tablet (4 mg total) by mouth 2 (two) times daily. 180 tablet 3  . furosemide (LASIX) 40 MG tablet Take 1 tablet (40 mg total) by mouth daily. As directed by cardiologist, Pt may take additional doses as needed 180 tablet 2  . glipiZIDE (GLUCOTROL XL) 2.5 MG 24 hr tablet Take 1 tablet (2.5 mg total) by mouth daily with lunch. 90 tablet 1  . hydrALAZINE (APRESOLINE) 100 MG tablet Take 1 tablet (100 mg total) by mouth 3 (three) times daily. 90 tablet 3  . levothyroxine (LEVOTHROID) 25 MCG tablet Take 1 tablet (25 mcg total) by mouth daily before breakfast. 90 tablet 3  . lisinopril (PRINIVIL,ZESTRIL) 20 MG tablet Take 1 tablet (20 mg total) by mouth daily. 90 tablet 1  . metoprolol tartrate (LOPRESSOR) 25 MG tablet TAKE 1 TABLET TWICE DAILY 180 tablet 3  . nitroGLYCERIN (NITROSTAT) 0.4 MG SL tablet Place 1 tablet (0.4 mg total) under the tongue every 5 (five) minutes as needed for  chest pain. If not improved seek medical attention in ER 15 tablet 1  . omeprazole (PRILOSEC) 20 MG capsule Take 20 mg by mouth daily.    . sitaGLIPtin (JANUVIA) 100 MG tablet Take 1 tablet (100 mg total) by mouth daily. 30 tablet 1  . vitamin B-12 (CYANOCOBALAMIN) 1000 MCG tablet Take 1,000 mcg by mouth every other day.     . warfarin (COUMADIN) 3 MG tablet Take as directed by anti-coagulation clinic. 70 tablet 3   No current facility-administered medications for this visit.      Allergies:   Alendronate sodium; Doxycycline; Isosorbide nitrate; Lidocaine; Metformin and related; Metronidazole; and Nifedipine   Social History:  The patient  reports that she has quit smoking. Her smoking use included Cigarettes. She has a 7.50 pack-year smoking history. She has never used smokeless tobacco. She reports that she does not drink alcohol or use drugs.   Family History:   family history includes Heart disease in her brother, father, and mother.    Review of  Systems: Review of Systems  Constitutional: Negative.   Respiratory: Positive for shortness of breath.   Cardiovascular: Positive for leg swelling.  Gastrointestinal: Negative.   Musculoskeletal: Negative.        Tremendous gait instability  Neurological: Negative.   Psychiatric/Behavioral: Negative.   All other systems reviewed and are negative.    PHYSICAL EXAM: VS:  BP (!) 180/82 (BP Location: Left Arm, Patient Position: Sitting, Cuff Size: Normal)   Pulse 70   Ht 5' 2" (1.575 m)   Wt 119 lb 12 oz (54.3 kg)   BMI 21.90 kg/m  , BMI Body mass index is 21.9 kg/m. GEN: Well nourished, well developed, in no acute distress , Leg weakness, difficulty mobilizing from table HEENT: normal  Neck: no JVD, carotid bruits, or masses Cardiac: RRR; I/VI SEM RSB,  no rubs, or gallops,no edema  Respiratory:  clear to auscultation bilaterally, normal work of breathing GI: soft, nontender, nondistended, + BS MS: no deformity or atrophy   Skin: warm and dry, no rash Neuro:  Strength and sensation are intact Psych: euthymic mood, full affect    Recent Labs: 08/01/2016: ALT 44; BUN 17; Creatinine, Ser 0.96; Hemoglobin 13.5; Platelets 222.0; Potassium 4.8; Sodium 138; TSH 4.76    Lipid Panel Lab Results  Component Value Date   CHOL 236 (H) 08/01/2016   HDL 49.40 08/01/2016   LDLCALC 96 01/04/2016   TRIG 201.0 (H) 08/01/2016      Wt Readings from Last 3 Encounters:  11/16/16 119 lb 12 oz (54.3 kg)  08/02/16 118 lb (53.5 kg)  05/16/16 113 lb 8 oz (51.5 kg)       ASSESSMENT AND PLAN:  HYPERCHOLESTEROLEMIA - Plan: EKG 12-Lead Currently not on a cholesterol medication We'll discuss with her in follow-up  Essential hypertension - Plan: EKG 12-Lead Initial blood pressure on arrival was markedly elevated 578 systolic Talked to her about her blood pressures, Recheck by myself with systolic pressure 469  NSTEMI (non-ST elevated myocardial infarction) ([place]) - Plan: EKG 12-Lead Currently with no symptoms of angina. No further workup at this time. Continue current medication regimen.  Syncope, unspecified syncope type - Plan: EKG 12-Lead No recent near syncope or syncope Tremendous gait instability  Palpitations - Plan: EKG 12-Lead  PVCs (premature ventricular contractions) - Plan: EKG 12-Lead PVCs seen on EKG today She is relatively asymptomatic. We'll continue low-dose beta blocker   Fall Risk Recommended she   walk with a walker, asked persistence from family   Total encounter time more than 25 minutes  Greater than 50% was spent in counseling and coordination of care with the patient   Disposition:   F/U  6 months  Orders Placed This Encounter  Procedures  . EKG 12-Lead     Signed, Esmond Plants, M.D., Ph.D. 11/16/2016  [place], [place]

## 2016-11-16 NOTE — Patient Instructions (Addendum)
Medication Instructions:   No medication changes made  For heart burn Take additional omprazole Ok to take ranitidine 1 to 2 pills as needed up to twice a day  Labwork:  No new labs needed  Testing/Procedures:  No further testing at this time   I recommend watching educational videos on topics of interest to you at:       [email or web address].goemmi.com  Enter code: HEARTCARE    Follow-Up: It was a pleasure seeing you in the office today. Please call us if you have new issues that need to be addressed before your next appt.  [phone]  Your physician wants you to follow-up in: 6 months.  You will receive a reminder letter in the mail two months in advance. If you don't receive a letter, please call our office to schedule the follow-up appointment.  If you need a refill on your cardiac medications before your next appointment, please call your pharmacy.

## 2016-11-21 ENCOUNTER — Ambulatory Visit (INDEPENDENT_AMBULATORY_CARE_PROVIDER_SITE_OTHER): Payer: Medicare HMO

## 2016-11-21 DIAGNOSIS — Z5181 Encounter for therapeutic drug level monitoring: Secondary | ICD-10-CM | POA: Diagnosis not present

## 2016-11-21 DIAGNOSIS — I4891 Unspecified atrial fibrillation: Secondary | ICD-10-CM

## 2016-11-21 LAB — POCT INR: INR: 2.5

## 2016-11-21 NOTE — Patient Instructions (Signed)
Pre visit review using our clinic review tool, if applicable. No additional management support is needed unless otherwise documented below in the visit note. 

## 2016-11-25 ENCOUNTER — Other Ambulatory Visit: Payer: Self-pay | Admitting: Family Medicine

## 2016-12-19 ENCOUNTER — Ambulatory Visit (INDEPENDENT_AMBULATORY_CARE_PROVIDER_SITE_OTHER): Payer: Medicare HMO

## 2016-12-19 DIAGNOSIS — Z5181 Encounter for therapeutic drug level monitoring: Secondary | ICD-10-CM

## 2016-12-19 DIAGNOSIS — I4891 Unspecified atrial fibrillation: Secondary | ICD-10-CM

## 2016-12-19 LAB — POCT INR: INR: 3.1

## 2016-12-19 NOTE — Patient Instructions (Signed)
Pre visit review using our clinic review tool, if applicable. No additional management support is needed unless otherwise documented below in the visit note. 

## 2017-01-03 ENCOUNTER — Other Ambulatory Visit: Payer: Self-pay | Admitting: Family Medicine

## 2017-01-04 ENCOUNTER — Other Ambulatory Visit: Payer: Self-pay | Admitting: *Deleted

## 2017-01-04 MED ORDER — ALPRAZOLAM 0.25 MG PO TABS: 0.1250 mg | ORAL_TABLET | Freq: Two times a day (BID) | ORAL | 1 refills | Status: DC | PRN

## 2017-01-04 NOTE — Telephone Encounter (Signed)
Px written for call in   

## 2017-01-04 NOTE — Telephone Encounter (Signed)
Will refill electronically  

## 2017-01-04 NOTE — Telephone Encounter (Signed)
Fax refill request, CPE was on 08/02/16, last filled on 11/07/16 #30 tabs with 1 additional refill, please advise

## 2017-01-04 NOTE — Telephone Encounter (Signed)
Rx called in as prescribed 

## 2017-01-04 NOTE — Telephone Encounter (Signed)
CPE was done on 08/02/16, med are due for a refill, please advise

## 2017-01-11 ENCOUNTER — Encounter: Payer: Self-pay | Admitting: *Deleted

## 2017-01-11 ENCOUNTER — Encounter: Payer: Self-pay | Admitting: Family Medicine

## 2017-01-12 ENCOUNTER — Ambulatory Visit (INDEPENDENT_AMBULATORY_CARE_PROVIDER_SITE_OTHER): Payer: Medicare HMO

## 2017-01-12 DIAGNOSIS — I4891 Unspecified atrial fibrillation: Secondary | ICD-10-CM

## 2017-01-12 DIAGNOSIS — Z5181 Encounter for therapeutic drug level monitoring: Secondary | ICD-10-CM

## 2017-01-12 LAB — POCT INR: INR: 2.4

## 2017-01-12 NOTE — Patient Instructions (Signed)
Pre visit review using our clinic review tool, if applicable. No additional management support is needed unless otherwise documented below in the visit note. 

## 2017-01-16 ENCOUNTER — Ambulatory Visit: Payer: Medicare HMO

## 2017-01-22 ENCOUNTER — Encounter: Payer: Self-pay | Admitting: Family Medicine

## 2017-01-25 MED ORDER — BUSPIRONE HCL 15 MG PO TABS: 15.0000 mg | ORAL_TABLET | Freq: Two times a day (BID) | ORAL | 3 refills | Status: DC

## 2017-02-02 ENCOUNTER — Other Ambulatory Visit: Payer: Self-pay | Admitting: *Deleted

## 2017-02-09 ENCOUNTER — Ambulatory Visit (INDEPENDENT_AMBULATORY_CARE_PROVIDER_SITE_OTHER): Payer: Medicare HMO

## 2017-02-09 DIAGNOSIS — Z5181 Encounter for therapeutic drug level monitoring: Secondary | ICD-10-CM

## 2017-02-09 DIAGNOSIS — I4891 Unspecified atrial fibrillation: Secondary | ICD-10-CM

## 2017-02-09 LAB — POCT INR: INR: 2.1

## 2017-02-09 NOTE — Patient Instructions (Signed)
Pre visit review using our clinic review tool, if applicable. No additional management support is needed unless otherwise documented below in the visit note. 

## 2017-02-13 ENCOUNTER — Ambulatory Visit: Payer: Medicare HMO

## 2017-02-14 ENCOUNTER — Other Ambulatory Visit: Payer: Self-pay | Admitting: Family Medicine

## 2017-02-15 NOTE — Telephone Encounter (Signed)
I think cardiology covers that

## 2017-02-15 NOTE — Telephone Encounter (Signed)
?   If you refill this med or is pt's cardiologist suppose to fill Rx, please advise

## 2017-02-15 NOTE — Telephone Encounter (Signed)
Routing to Dr. Rockey Situ (see prev note)

## 2017-02-27 ENCOUNTER — Other Ambulatory Visit: Payer: Self-pay | Admitting: *Deleted

## 2017-02-28 NOTE — Telephone Encounter (Signed)
Daughter called to check status of refill request, I advise we routed Rx to cardiology but I will resend it to them

## 2017-03-08 ENCOUNTER — Other Ambulatory Visit: Payer: Self-pay | Admitting: *Deleted

## 2017-03-08 MED ORDER — ALPRAZOLAM 0.25 MG PO TABS: 0.1250 mg | ORAL_TABLET | Freq: Two times a day (BID) | ORAL | 3 refills | Status: DC | PRN

## 2017-03-08 NOTE — Telephone Encounter (Signed)
Rx called in as prescribed 

## 2017-03-08 NOTE — Telephone Encounter (Signed)
Px written for call in   

## 2017-03-08 NOTE — Telephone Encounter (Signed)
Fax refill request, last OV was 08/02/16, and last filled was on 01/04/17 #30 tabs with 1 additional refills, please advise

## 2017-03-16 ENCOUNTER — Ambulatory Visit (INDEPENDENT_AMBULATORY_CARE_PROVIDER_SITE_OTHER): Payer: Medicare HMO

## 2017-03-16 DIAGNOSIS — Z5181 Encounter for therapeutic drug level monitoring: Secondary | ICD-10-CM | POA: Diagnosis not present

## 2017-03-16 DIAGNOSIS — I4891 Unspecified atrial fibrillation: Secondary | ICD-10-CM

## 2017-03-16 LAB — POCT INR: INR: 2.3

## 2017-03-16 NOTE — Patient Instructions (Signed)
Pre visit review using our clinic review tool, if applicable. No additional management support is needed unless otherwise documented below in the visit note. 

## 2017-03-18 NOTE — Progress Notes (Signed)
Agree. Thanks

## 2017-03-22 ENCOUNTER — Other Ambulatory Visit: Payer: Self-pay | Admitting: *Deleted

## 2017-03-22 MED ORDER — SITAGLIPTIN PHOSPHATE 100 MG PO TABS: 100.0000 mg | ORAL_TABLET | Freq: Every day | ORAL | 0 refills | Status: DC

## 2017-03-30 DIAGNOSIS — H35371 Puckering of macula, right eye: Secondary | ICD-10-CM | POA: Diagnosis not present

## 2017-04-13 ENCOUNTER — Ambulatory Visit (INDEPENDENT_AMBULATORY_CARE_PROVIDER_SITE_OTHER): Payer: Medicare HMO

## 2017-04-13 DIAGNOSIS — Z5181 Encounter for therapeutic drug level monitoring: Secondary | ICD-10-CM

## 2017-04-13 DIAGNOSIS — I4891 Unspecified atrial fibrillation: Secondary | ICD-10-CM

## 2017-04-13 LAB — POCT INR: INR: 2.6

## 2017-04-13 NOTE — Patient Instructions (Signed)
Pre visit review using our clinic review tool, if applicable. No additional management support is needed unless otherwise documented below in the visit note. 

## 2017-05-07 ENCOUNTER — Other Ambulatory Visit: Payer: Self-pay | Admitting: *Deleted

## 2017-05-07 DIAGNOSIS — I10 Essential (primary) hypertension: Secondary | ICD-10-CM

## 2017-05-07 MED ORDER — LISINOPRIL 20 MG PO TABS: 20.0000 mg | ORAL_TABLET | Freq: Every day | ORAL | 3 refills | Status: DC

## 2017-05-07 MED ORDER — GLIPIZIDE ER 2.5 MG PO TB24: ORAL_TABLET | ORAL | 1 refills | Status: DC

## 2017-05-07 MED ORDER — WARFARIN SODIUM 3 MG PO TABS: ORAL_TABLET | ORAL | 1 refills | Status: DC

## 2017-05-07 NOTE — Telephone Encounter (Signed)
Patient compliant with coumadin management. Will refill X 6 months.

## 2017-05-07 NOTE — Telephone Encounter (Signed)
See sees Dr Gabriel Carina for diabetes  Please have her see me in December   Will forward to [facility] for warfarin

## 2017-05-07 NOTE — Telephone Encounter (Signed)
Lugene notified Rxs sent and mother needs f/u in Dec, and Rx for coumadin routed to [facility]

## 2017-05-07 NOTE — Telephone Encounter (Signed)
Last OV was 08/02/16 and no future appts.,  Also rout Rx request to [facility] since she is over pt's Coumadin

## 2017-05-11 ENCOUNTER — Ambulatory Visit (INDEPENDENT_AMBULATORY_CARE_PROVIDER_SITE_OTHER): Payer: Medicare HMO

## 2017-05-11 DIAGNOSIS — I4891 Unspecified atrial fibrillation: Secondary | ICD-10-CM

## 2017-05-11 DIAGNOSIS — Z5181 Encounter for therapeutic drug level monitoring: Secondary | ICD-10-CM

## 2017-05-11 LAB — POCT INR: INR: 3.3

## 2017-05-11 NOTE — Patient Instructions (Signed)
Pre visit review using our clinic review tool, if applicable. No additional management support is needed unless otherwise documented below in the visit note. 

## 2017-05-25 ENCOUNTER — Ambulatory Visit: Payer: Medicare HMO

## 2017-05-29 ENCOUNTER — Other Ambulatory Visit: Payer: Self-pay | Admitting: *Deleted

## 2017-05-29 ENCOUNTER — Ambulatory Visit (INDEPENDENT_AMBULATORY_CARE_PROVIDER_SITE_OTHER): Payer: Medicare HMO

## 2017-05-29 DIAGNOSIS — Z7901 Long term (current) use of anticoagulants: Secondary | ICD-10-CM | POA: Diagnosis not present

## 2017-05-29 DIAGNOSIS — Z5181 Encounter for therapeutic drug level monitoring: Secondary | ICD-10-CM

## 2017-05-29 DIAGNOSIS — Z23 Encounter for immunization: Secondary | ICD-10-CM

## 2017-05-29 DIAGNOSIS — I4891 Unspecified atrial fibrillation: Secondary | ICD-10-CM

## 2017-05-29 LAB — POCT INR
INR: 2.4
INR: 2.7

## 2017-05-29 MED ORDER — ALPRAZOLAM 0.25 MG PO TABS: 0.1250 mg | ORAL_TABLET | Freq: Two times a day (BID) | ORAL | 3 refills | Status: DC | PRN

## 2017-05-29 NOTE — Telephone Encounter (Signed)
Rx called in as prescribed 

## 2017-05-29 NOTE — Patient Instructions (Signed)
Pre visit review using our clinic review tool, if applicable. No additional management support is needed unless otherwise documented below in the visit note. 

## 2017-05-29 NOTE — Telephone Encounter (Signed)
CPE was 08/02/16 and no future appts., last filled on 03/08/17 #30 tabs with 3 additional refills, please advise

## 2017-05-29 NOTE — Telephone Encounter (Signed)
Px written for call in   

## 2017-06-26 ENCOUNTER — Ambulatory Visit (INDEPENDENT_AMBULATORY_CARE_PROVIDER_SITE_OTHER): Payer: Medicare HMO | Admitting: General Practice

## 2017-06-26 DIAGNOSIS — I4891 Unspecified atrial fibrillation: Secondary | ICD-10-CM

## 2017-06-26 DIAGNOSIS — Z7901 Long term (current) use of anticoagulants: Secondary | ICD-10-CM | POA: Diagnosis not present

## 2017-06-26 DIAGNOSIS — Z5181 Encounter for therapeutic drug level monitoring: Secondary | ICD-10-CM | POA: Diagnosis not present

## 2017-06-26 DIAGNOSIS — I4892 Unspecified atrial flutter: Secondary | ICD-10-CM

## 2017-06-26 LAB — POCT INR: INR: 2.3

## 2017-06-26 NOTE — Patient Instructions (Signed)
Pre visit review using our clinic review tool, if applicable. No additional management support is needed unless otherwise documented below in the visit note. 

## 2017-07-23 ENCOUNTER — Encounter: Payer: Self-pay | Admitting: Family Medicine

## 2017-07-24 ENCOUNTER — Ambulatory Visit: Payer: Medicare HMO

## 2017-07-24 DIAGNOSIS — I4892 Unspecified atrial flutter: Secondary | ICD-10-CM

## 2017-07-24 DIAGNOSIS — Z7901 Long term (current) use of anticoagulants: Secondary | ICD-10-CM

## 2017-07-24 DIAGNOSIS — I4891 Unspecified atrial fibrillation: Secondary | ICD-10-CM

## 2017-07-24 LAB — POCT INR: INR: 2.9

## 2017-07-24 NOTE — Patient Instructions (Signed)
Pre visit review using our clinic review tool, if applicable. No additional management support is needed unless otherwise documented below in the visit note. 

## 2017-08-15 ENCOUNTER — Encounter: Payer: Self-pay | Admitting: Family Medicine

## 2017-08-15 ENCOUNTER — Ambulatory Visit: Payer: Medicare HMO | Admitting: Family Medicine

## 2017-08-15 VITALS — BP 104/64 | HR 55 | Temp 98.1°F | Ht 62.0 in | Wt 129.0 lb

## 2017-08-15 DIAGNOSIS — Z9181 History of falling: Secondary | ICD-10-CM | POA: Diagnosis not present

## 2017-08-15 DIAGNOSIS — F419 Anxiety disorder, unspecified: Secondary | ICD-10-CM | POA: Diagnosis not present

## 2017-08-15 DIAGNOSIS — E1159 Type 2 diabetes mellitus with other circulatory complications: Secondary | ICD-10-CM

## 2017-08-15 DIAGNOSIS — I4891 Unspecified atrial fibrillation: Secondary | ICD-10-CM

## 2017-08-15 DIAGNOSIS — E559 Vitamin D deficiency, unspecified: Secondary | ICD-10-CM

## 2017-08-15 DIAGNOSIS — E7800 Pure hypercholesterolemia, unspecified: Secondary | ICD-10-CM | POA: Diagnosis not present

## 2017-08-15 DIAGNOSIS — F5104 Psychophysiologic insomnia: Secondary | ICD-10-CM

## 2017-08-15 DIAGNOSIS — I10 Essential (primary) hypertension: Secondary | ICD-10-CM | POA: Diagnosis not present

## 2017-08-15 DIAGNOSIS — I5032 Chronic diastolic (congestive) heart failure: Secondary | ICD-10-CM | POA: Diagnosis not present

## 2017-08-15 DIAGNOSIS — G4700 Insomnia, unspecified: Secondary | ICD-10-CM | POA: Insufficient documentation

## 2017-08-15 LAB — LIPID PANEL
CHOL/HDL RATIO: 7
Cholesterol: 271 mg/dL — ABNORMAL HIGH (ref 0–200)
HDL: 39.4 mg/dL (ref >39.00)
NONHDL: 231.33
TRIGLYCERIDES: 320 mg/dL — AB (ref 0.0–149.0)
VLDL: 64 mg/dL — ABNORMAL HIGH (ref 0.0–40.0)

## 2017-08-15 LAB — COMPREHENSIVE METABOLIC PANEL
ALT: 61 U/L — AB (ref 0–35)
AST: 68 U/L — ABNORMAL HIGH (ref 0–37)
Albumin: 4.1 g/dL (ref 3.5–5.2)
Alkaline Phosphatase: 47 U/L (ref 39–117)
BUN: 27 mg/dL — AB (ref 6–23)
CO2: 30 meq/L (ref 19–32)
Calcium: 9.6 mg/dL (ref 8.4–10.5)
Chloride: 100 mEq/L (ref 96–112)
Creatinine, Ser: 1.14 mg/dL (ref 0.40–1.20)
GFR: 47.7 mL/min — AB (ref >60.00)
GLUCOSE: 265 mg/dL — AB (ref 70–99)
POTASSIUM: 3.8 meq/L (ref 3.5–5.1)
SODIUM: 139 meq/L (ref 135–145)
Total Bilirubin: 0.5 mg/dL (ref 0.2–1.2)
Total Protein: 7.5 g/dL (ref 6.0–8.3)

## 2017-08-15 LAB — CBC WITH DIFFERENTIAL/PLATELET
BASOS ABS: 0.1 10*3/uL (ref 0.0–0.1)
Basophils Relative: 0.9 % (ref 0.0–3.0)
Eosinophils Absolute: 0.1 10*3/uL (ref 0.0–0.7)
Eosinophils Relative: 0.9 % (ref 0.0–5.0)
HCT: 41.8 % (ref 36.0–46.0)
Hemoglobin: 13.5 g/dL (ref 12.0–15.0)
LYMPHS ABS: 1.2 10*3/uL (ref 0.7–4.0)
Lymphocytes Relative: 16.2 % (ref 12.0–46.0)
MCHC: 32.2 g/dL (ref 30.0–36.0)
MCV: 92.3 fl (ref 78.0–100.0)
MONO ABS: 0.5 10*3/uL (ref 0.1–1.0)
MONOS PCT: 7.4 % (ref 3.0–12.0)
NEUTROS ABS: 5.4 10*3/uL (ref 1.4–7.7)
NEUTROS PCT: 74.6 % (ref 43.0–77.0)
PLATELETS: 228 10*3/uL (ref 150.0–400.0)
RBC: 4.53 Mil/uL (ref 3.87–5.11)
RDW: 15.4 % (ref 11.5–15.5)
WBC: 7.2 10*3/uL (ref 4.0–10.5)

## 2017-08-15 LAB — LDL CHOLESTEROL, DIRECT: Direct LDL: 177 mg/dL

## 2017-08-15 LAB — TSH: TSH: 4.48 u[IU]/mL (ref 0.35–4.50)

## 2017-08-15 LAB — HEMOGLOBIN A1C: HEMOGLOBIN A1C: 6.6 % — AB (ref 4.6–6.5)

## 2017-08-15 LAB — VITAMIN D 25 HYDROXY (VIT D DEFICIENCY, FRACTURES): VITD: 44.22 ng/mL (ref 30.00–100.00)

## 2017-08-15 MED ORDER — CLONAZEPAM 0.5 MG PO TABS: 0.5000 mg | ORAL_TABLET | Freq: Every day | ORAL | 3 refills | Status: DC

## 2017-08-15 MED ORDER — BUSPIRONE HCL 30 MG PO TABS: 30.0000 mg | ORAL_TABLET | Freq: Two times a day (BID) | ORAL | 11 refills | Status: DC

## 2017-08-15 NOTE — Assessment & Plan Note (Signed)
bp in fair control at this time  BP Readings from Last 1 Encounters:  08/15/17 104/64   No changes needed Disc lifstyle change with low sodium diet and exercise  Well controlled  Labs today  Cardiac status has been stable

## 2017-08-15 NOTE — Assessment & Plan Note (Signed)
Suspect multifactorial with worry/anxiety/adv age/ need to nap during the day  Avoids caffeine  Is more anx   Will inc buspar to 30 mg bid for anx  Change xanax to klonopin 0.5 mg in light of problem with sleep latency --- hope this will minimize early awakening Disc sleep hygiene  Also disc natural change in sleep with age   Has fall hx- rev in detail-aware benzo will inc her risks and daughter is aware/ disc fall prev in detail

## 2017-08-15 NOTE — Assessment & Plan Note (Addendum)
Off statin (pravastatin) due to elevation of LFT  At her age-not apt to try a non statin med/ but does have vascular dz  Eats a healthy diet  Lab today

## 2017-08-15 NOTE — Assessment & Plan Note (Signed)
Disc fall precautions  Hesitant to inc any sedating medication  Using walker at all times and keeping the wheels locked for transfers  Daughter present for conversation today

## 2017-08-15 NOTE — Assessment & Plan Note (Signed)
Worse lately with poor sleep and stressors (husband) and age   Will inc buspar to 30 mg bid from 64   (she may try 1 1/2 of the 15 mg pill first) Discussed expectations of SSRI medication including time to effectiveness and mechanism of action, also poss of side effects (early and late)- including mental fuzziness, weight or appetite change, nausea and poss of worse dep or anxiety (even suicidal thoughts)  Pt voiced understanding and will stop med and update if this occurs   Update if side effects or if not helpful

## 2017-08-15 NOTE — Assessment & Plan Note (Signed)
A1C today  No reported high or low glucose readings except one suspected low late in evening  Not skipping meals

## 2017-08-15 NOTE — Assessment & Plan Note (Signed)
Lab today.
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
## 2017-08-15 NOTE — Patient Instructions (Signed)
Change from xanax to klonopin at bedtime  Be very careful not to fall   Increase buspar to 30 mg twice daily (can start with 1 1/2 of the 15 mg first if you want to)  Keep me posted  Lab today   Schedule physical on the way out

## 2017-08-16 NOTE — Assessment & Plan Note (Signed)
NSR today 

## 2017-08-16 NOTE — Assessment & Plan Note (Signed)
Stable/no c/o today  Trace pedal edema

## 2017-08-21 ENCOUNTER — Ambulatory Visit: Payer: Medicare HMO

## 2017-08-22 ENCOUNTER — Ambulatory Visit: Payer: Medicare HMO | Admitting: Cardiovascular Disease

## 2017-08-22 ENCOUNTER — Encounter: Payer: Self-pay | Admitting: Cardiovascular Disease

## 2017-08-22 VITALS — BP 160/94 | HR 69 | Ht 61.0 in | Wt 128.2 lb

## 2017-08-22 DIAGNOSIS — I25118 Atherosclerotic heart disease of native coronary artery with other forms of angina pectoris: Secondary | ICD-10-CM | POA: Diagnosis not present

## 2017-08-22 DIAGNOSIS — E7800 Pure hypercholesterolemia, unspecified: Secondary | ICD-10-CM

## 2017-08-22 DIAGNOSIS — I5032 Chronic diastolic (congestive) heart failure: Secondary | ICD-10-CM

## 2017-08-22 DIAGNOSIS — I10 Essential (primary) hypertension: Secondary | ICD-10-CM

## 2017-08-22 DIAGNOSIS — I4892 Unspecified atrial flutter: Secondary | ICD-10-CM

## 2017-08-22 DIAGNOSIS — I4891 Unspecified atrial fibrillation: Secondary | ICD-10-CM

## 2017-08-22 MED ORDER — ROSUVASTATIN CALCIUM 5 MG PO TABS: 5.0000 mg | ORAL_TABLET | Freq: Every day | ORAL | 3 refills | Status: DC

## 2017-08-22 MED ORDER — EZETIMIBE 10 MG PO TABS: 10.0000 mg | ORAL_TABLET | Freq: Every day | ORAL | 3 refills | Status: DC
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
## 2017-08-22 NOTE — Patient Instructions (Addendum)
Medication Instructions:   Please start crestor 5 mg daily Start zetia one a day  For cholesterol  Labwork:  No new labs needed  Testing/Procedures:  No further testing at this time   Follow-Up: It was a pleasure seeing you in the office today. Please call us if you have new issues that need to be addressed before your next appt.  [phone]  Your physician wants you to follow-up in: 12 months.  You will receive a reminder letter in the mail two months in advance. If you don't receive a letter, please call our office to schedule the follow-up appointment.  If you need a refill on your cardiac medications before your next appointment, please call your pharmacy.

## 2017-08-23 ENCOUNTER — Other Ambulatory Visit: Payer: Self-pay | Admitting: *Deleted

## 2017-08-23 ENCOUNTER — Other Ambulatory Visit: Payer: Self-pay | Admitting: Family Medicine

## 2017-08-28 ENCOUNTER — Ambulatory Visit: Payer: Medicare HMO

## 2017-08-30 ENCOUNTER — Ambulatory Visit (INDEPENDENT_AMBULATORY_CARE_PROVIDER_SITE_OTHER): Payer: Medicare HMO | Admitting: General Practice

## 2017-08-30 DIAGNOSIS — Z7901 Long term (current) use of anticoagulants: Secondary | ICD-10-CM | POA: Diagnosis not present

## 2017-08-30 DIAGNOSIS — I4891 Unspecified atrial fibrillation: Secondary | ICD-10-CM | POA: Diagnosis not present

## 2017-08-30 DIAGNOSIS — I4892 Unspecified atrial flutter: Secondary | ICD-10-CM

## 2017-08-30 LAB — POCT INR: INR: 2.9

## 2017-08-30 NOTE — Patient Instructions (Addendum)
Pre visit review using our clinic review tool, if applicable. No additional management support is needed unless otherwise documented below in the visit note.  Continue to take 1 pill daily EXCEPT for 1/2 pill on Wednesdays and Fridays.  Recheck in 4 weeks.

## 2017-09-13 ENCOUNTER — Encounter: Payer: Self-pay | Admitting: Cardiovascular Disease

## 2017-09-19 ENCOUNTER — Ambulatory Visit: Payer: Medicare HMO

## 2017-09-24 ENCOUNTER — Other Ambulatory Visit: Payer: Self-pay | Admitting: Family Medicine

## 2017-09-24 ENCOUNTER — Ambulatory Visit: Payer: Medicare HMO

## 2017-09-24 ENCOUNTER — Encounter: Payer: Self-pay | Admitting: Family Medicine

## 2017-09-24 ENCOUNTER — Other Ambulatory Visit: Payer: Self-pay | Admitting: Cardiovascular Disease

## 2017-09-24 ENCOUNTER — Other Ambulatory Visit: Payer: Self-pay | Admitting: *Deleted

## 2017-09-24 ENCOUNTER — Encounter: Payer: Medicare HMO | Admitting: Family Medicine

## 2017-09-24 MED ORDER — BUSPIRONE HCL 30 MG PO TABS: 30.0000 mg | ORAL_TABLET | Freq: Two times a day (BID) | ORAL | 1 refills | Status: DC

## 2017-09-24 NOTE — Telephone Encounter (Signed)
Patient has appointment with Dr. Glori Bickers on 10/24/17 for annual exam.  Will refill meds X 1 year and Warfarin X 6 months as is compliant with all management and care.

## 2017-09-25 ENCOUNTER — Ambulatory Visit (INDEPENDENT_AMBULATORY_CARE_PROVIDER_SITE_OTHER): Payer: Medicare HMO

## 2017-09-25 DIAGNOSIS — Z7901 Long term (current) use of anticoagulants: Secondary | ICD-10-CM | POA: Diagnosis not present

## 2017-09-25 DIAGNOSIS — I4891 Unspecified atrial fibrillation: Secondary | ICD-10-CM

## 2017-09-25 DIAGNOSIS — I4892 Unspecified atrial flutter: Secondary | ICD-10-CM

## 2017-09-25 LAB — POCT INR: INR: 2.4

## 2017-09-25 NOTE — Patient Instructions (Signed)
INR today 2.4  Continue to take 1 pill daily EXCEPT for 1/2 pill on Wednesdays and Fridays.  Recheck in 6 weeks.    Patient is doing well without any changes in diet, health or medications.  Written instructions given to patient who will pass along to daughter who manages her pill box.

## 2017-09-27 ENCOUNTER — Ambulatory Visit: Payer: Medicare HMO

## 2017-10-02 DIAGNOSIS — H35371 Puckering of macula, right eye: Secondary | ICD-10-CM | POA: Diagnosis not present

## 2017-10-24 ENCOUNTER — Ambulatory Visit (INDEPENDENT_AMBULATORY_CARE_PROVIDER_SITE_OTHER): Payer: Medicare HMO | Admitting: Family Medicine

## 2017-10-24 ENCOUNTER — Other Ambulatory Visit: Payer: Self-pay | Admitting: Family Medicine

## 2017-10-24 ENCOUNTER — Encounter: Payer: Self-pay | Admitting: Family Medicine

## 2017-10-24 VITALS — BP 126/72 | HR 57 | Temp 98.2°F | Ht 61.0 in | Wt 127.5 lb

## 2017-10-24 DIAGNOSIS — Z0000 Encounter for general adult medical examination without abnormal findings: Secondary | ICD-10-CM

## 2017-10-24 DIAGNOSIS — M810 Age-related osteoporosis without current pathological fracture: Secondary | ICD-10-CM

## 2017-10-24 DIAGNOSIS — E7800 Pure hypercholesterolemia, unspecified: Secondary | ICD-10-CM | POA: Diagnosis not present

## 2017-10-24 DIAGNOSIS — H9193 Unspecified hearing loss, bilateral: Secondary | ICD-10-CM | POA: Diagnosis not present

## 2017-10-24 DIAGNOSIS — I5033 Acute on chronic diastolic (congestive) heart failure: Secondary | ICD-10-CM | POA: Diagnosis not present

## 2017-10-24 DIAGNOSIS — I5032 Chronic diastolic (congestive) heart failure: Secondary | ICD-10-CM

## 2017-10-24 DIAGNOSIS — I4891 Unspecified atrial fibrillation: Secondary | ICD-10-CM | POA: Diagnosis not present

## 2017-10-24 DIAGNOSIS — D518 Other vitamin B12 deficiency anemias: Secondary | ICD-10-CM | POA: Diagnosis not present

## 2017-10-24 DIAGNOSIS — E1159 Type 2 diabetes mellitus with other circulatory complications: Secondary | ICD-10-CM | POA: Diagnosis not present

## 2017-10-24 DIAGNOSIS — E785 Hyperlipidemia, unspecified: Secondary | ICD-10-CM

## 2017-10-24 DIAGNOSIS — F419 Anxiety disorder, unspecified: Secondary | ICD-10-CM

## 2017-10-24 DIAGNOSIS — I10 Essential (primary) hypertension: Secondary | ICD-10-CM

## 2017-10-24 DIAGNOSIS — R740 Nonspecific elevation of levels of transaminase and lactic acid dehydrogenase [LDH]: Secondary | ICD-10-CM

## 2017-10-24 DIAGNOSIS — Z9181 History of falling: Secondary | ICD-10-CM

## 2017-10-24 DIAGNOSIS — E559 Vitamin D deficiency, unspecified: Secondary | ICD-10-CM

## 2017-10-24 DIAGNOSIS — R7401 Elevation of levels of liver transaminase levels: Secondary | ICD-10-CM

## 2017-10-24 LAB — LIPID PANEL
Cholesterol: 132 mg/dL (ref 0–200)
HDL: 45.7 mg/dL (ref >39.00)
NonHDL: 86.51
Total CHOL/HDL Ratio: 3
Triglycerides: 228 mg/dL — ABNORMAL HIGH (ref 0.0–149.0)
VLDL: 45.6 mg/dL — AB (ref 0.0–40.0)

## 2017-10-24 LAB — LDL CHOLESTEROL, DIRECT: LDL DIRECT: 63 mg/dL

## 2017-10-24 LAB — AST: AST: 52 U/L — ABNORMAL HIGH (ref 0–37)

## 2017-10-24 LAB — ALT: ALT: 48 U/L — AB (ref 0–35)

## 2017-10-24 NOTE — Progress Notes (Signed)
Subjective:    Patient ID: Bianca Mueller, female    DOB: September 17, 1926, 84 y.o.   MRN: [id number]  HPI  I have personally reviewed the Medicare Annual Wellness questionnaire and have noted 1. The patient's medical and social history 2. Their use of alcohol, tobacco or illicit drugs 3. Their current medications and supplements 4. The patient's functional ability including ADL's, fall risks, home safety risks and hearing or visual             impairment. 5. Diet and physical activities 6. Evidence for depression or mood disorders  The patients weight, height, BMI have been recorded in the chart and visual acuity is per eye clinic.  I have made referrals, counseling and provided education to the patient based review of the above and I have provided the pt with a written personalized care plan for preventive services. Reviewed and updated provider list, see scanned forms.  Wt Readings from Last 3 Encounters:  10/24/17 127 lb 8 oz (57.8 kg)  08/22/17 128 lb 4 oz (58.2 kg)  08/15/17 129 lb (58.5 kg)  overall stable  24.09 kg/m   Per pt good and bad days  Has weak days / correlates with poor sleep (some nights she sleeps and some she does not)    Hearing Screening   125Hz  250Hz  500Hz  1000Hz  2000Hz  3000Hz  4000Hz  6000Hz  8000Hz   Right ear:   0 0 0  0    Left ear:   0 0 0  0    Vision Screening Comments: [facility] exam was done on 09/2017 at [facility] pt declines any hearing augmentation Hard on the family  No safety issues  Still has a full time live in care giver - for now    See scanned forms.  Routine anticipatory guidance given to patient.  See health maintenance. Colon cancer screening colonoscopy 2010 with ademoma  Breast cancer screening mammogram 11/15-not interested in mammograms any more  Self breast exam-no lumps  Has had a hysterectomy Last eye exam 1/19 Flu vaccine- had it in the fall  Tetanus vaccine 12/06 Pneumovax up to date  Zoster vaccine 7/11. Not  interested in shingrix yet  dexa 6/11 OP   Declined another dexa  (intol of alendronate in the past)  Falls in the past 12 months fell twice (no injury) -- doing everything to prevent them/committed to using walker and much more careful not to fall and caregiver in the house  Vit D level 44.2 in nov Advance directive=has living will and power of attorney  Cognitive function addressed- see scanned forms- and if abnormal then additional documentation follows.  She has no concerns about memory  Did give up her drivers license   PMH and [place] reviewed  Meds, vitals, and allergies reviewed.   ROS: See HPI.  Otherwise negative.    bp is stable today (it tends to vary)  No change from cardiology (Dr Rockey Situ)  No cp or palpitations or headaches or edema  No side effects to medicines  BP Readings from Last 3 Encounters:  10/24/17 126/72  08/22/17 (!) 160/94  08/15/17 104/64   Lab Results  Component Value Date   CREATININE 1.14 08/15/2017   BUN 27 (H) 08/15/2017   NA 139 08/15/2017   K 3.8 08/15/2017   CL 100 08/15/2017   CO2 30 08/15/2017  not enough fluids- just with meals/coffee  Swelling is better    Lab Results  Component Value Date   WBC 7.2 08/15/2017  HGB 13.5 08/15/2017   HCT 41.8 08/15/2017   MCV 92.3 08/15/2017   PLT 228.0 08/15/2017       DM2 Lab Results  Component Value Date   HGBA1C 6.6 (H) 08/15/2017   up to date  Was seeing Dr Gabriel Carina for diabetes (too much now and she cannot afford copay)  januvia  Glipizide  Well controlled  On ace for renal protection  Eats regularly now  May have some episodes of low glucose but does not check it    hyperlipidemia Lab Results  Component Value Date   CHOL 271 (H) 08/15/2017   HDL 39.40 08/15/2017   LDLCALC 96 01/04/2016   LDLDIRECT 177.0 08/15/2017   TRIG 320.0 (H) 08/15/2017   CHOLHDL 7 08/15/2017  currently on very low dose crestor and zetia  That was before she started on crestor  Needs labs  Has not eaten  since 7 am   Liver fxn Lab Results  Component Value Date   ALT 61 (H) 08/15/2017   AST 68 (H) 08/15/2017   ALKPHOS 47 08/15/2017   BILITOT 0.5 08/15/2017    H/o B12 def Lab Results  Component Value Date   VITAMINB12 >1500 (H) 08/01/2016    Hypothyroidism  Pt has no clinical changes No change in energy level/ hair or skin/ edema and no tremor Lab Results  Component Value Date   TSH 4.48 08/15/2017     Patient Active Problem List   Diagnosis Date Noted  . Medicare annual wellness visit, subsequent 10/24/2017  . Insomnia disorder 08/15/2017  . Long term (current) use of anticoagulants 06/26/2017  . At high risk for falls 11/16/2016  . Routine general medical examination at a health care facility 08/03/2016  . Anxiety 11/08/2015  . Gait disorder 09/21/2015  . History of fall 08/24/2015  . Hearing loss 08/24/2015  . Acute on chronic diastolic heart failure ([place]) 08/07/2014  . Encounter for therapeutic drug monitoring 08/06/2014  . Chronic diastolic CHF (congestive heart failure) ([place])   . CAD (coronary artery disease)   . Atrial fibrillation ([place])   . Transaminitis 07/20/2014  . Atrial flutter ([place]) 07/20/2014  . NSTEMI (non-ST elevated myocardial infarction) ([place]) 07/18/2014  . Encounter for Medicare annual wellness exam 08/19/2013  . Other screening mammogram 05/17/2012  . Adult failure to thrive 05/17/2011  . DM2 (diabetes mellitus, type 2) ([place]) 02/14/2011  . ANEMIA, VITAMIN B12 DEFICIENCY 09/26/2010  . PEDAL EDEMA 03/16/2010  . [place] ALLERGY 09/06/2009  . Vitamin D deficiency 03/02/2009  . DIVERTICULITIS, COLON 11/12/2008  . COLONIC POLYPS, HX OF 09/02/2008  . HYPERCHOLESTEROLEMIA 12/10/2006  . Essential hypertension 12/10/2006  . Osteoporosis 03/18/2002   Past Medical History:  Diagnosis Date  . Arthritis    "hips" (08/07/2014)  . CAD (coronary artery disease)    a. 1989 s/p MI, TPA, PTCA;  b. 06/2014 NSTEMI/Cath: LM nl, LAD 30-40, D2 90, small, LCX  171m OM1 90, RCA Cs2+ 70-80p, 50-681m90d (? ruptured plaque), EF 55-60%-->Med Rx.  . Chronic diastolic CHF (congestive heart failure) ([place]   a. 07/2014 Echo: EF 60-65%, Gr 1 DD, mild to mod TR, PASP 3775m.  . Diverticulosis   . Elevated LFTs    a. 06/2014 Abd U/S: no GB pathology.  . GMarland KitchenRD (gastroesophageal reflux disease)   . Hx of adenomatous colonic polyps 2010   last colonscopy 2010,Dr EllVira Agarno need for further  colonoscopy given age  . Hypercalcemia   . Hyperlipidemia   . Hypertension   .  Myocardial infarction ([place]) 1989  . Osteoporosis   . PAF/PAFlutter    a. 06/2014->amio and coumadin started.  . Type II diabetes mellitus ([place])   . Zoster    past   Past Surgical History:  Procedure Laterality Date  . CARDIAC CATHETERIZATION  07/22/2014  . CORONARY ANGIOPLASTY  1989   "@ [facility]"  . LEFT HEART CATHETERIZATION WITH CORONARY ANGIOGRAM N/A 07/22/2014   Procedure: LEFT HEART CATHETERIZATION WITH CORONARY ANGIOGRAM;  Surgeon: Sinclair Grooms, MD;  Location: [facility] CATH LAB;  Service: Cardiovascular;  Laterality: N/A;  . SHOULDER OPEN ROTATOR CUFF REPAIR Right 1980's  . VAGINAL HYSTERECTOMY  1970's?   ovaries intact   Social History   Tobacco Use  . Smoking status: Former Smoker    Packs/day: 0.25    Years: 30.00    Pack years: 7.50    Types: Cigarettes  . Smokeless tobacco: Never Used  . Tobacco comment: "quit smoking in the 1980's"  Substance Use Topics  . Alcohol use: No    Alcohol/week: 0.0 oz  . Drug use: No   Family History  Problem Relation Age of Onset  . Heart disease Mother        CAD  . Heart disease Father        CAD  . Heart disease Brother        CAD   Allergies  Allergen Reactions  . Alendronate Sodium Other (See Comments)    unknown  . Doxycycline Other (See Comments)    Inc LFT  . Isosorbide Nitrate Other (See Comments)  . Lidocaine Nausea Only    Given at Dentist office  . Metformin And Related     Decreases appetite resulting in  weight loss  . Metronidazole Nausea And Vomiting    REACTION: sick/ GI  . Nifedipine Other (See Comments)   Current Outpatient Medications on File Prior to Visit  Medication Sig Dispense Refill  . ACCU-CHEK AVIVA PLUS test strip USE AS INSTRUCTED TO CHECK BLOOD SUGAR ONCE DAILY OR AS NEEDED.   100 each 4  . ACCU-CHEK FASTCLIX LANCETS MISC Use as instructed to test blood sugar once daily or as needed.  Diagnosis:  E11.9   Non insulin-dependent. 102 each 4  . ALPRAZolam (XANAX) 0.25 MG tablet Take 0.25 mg by mouth as needed.    Marland Kitchen amiodarone (PACERONE) 200 MG tablet Take 200 mg by mouth daily.    . Blood Glucose Monitoring Suppl (ACCU-CHEK AVIVA PLUS) W/DEVICE KIT Use as instructed to test blood sugar once daily or as needed.  Diagnosis:  E11.9  Non insulin-dependent. 1 kit 0  . busPIRone (BUSPAR) 30 MG tablet Take 1 tablet (30 mg total) by mouth 2 (two) times daily. 180 tablet 1  . cholecalciferol (VITAMIN D) 1000 UNITS tablet Take 2,000 Units by mouth daily at 12 noon.     . clonazePAM (KLONOPIN) 0.5 MG tablet Take 1 tablet (0.5 mg total) by mouth at bedtime. 30 tablet 3  . docusate sodium 100 MG CAPS Take 100 mg by mouth 2 (two) times daily. 10 capsule 0  . doxazosin (CARDURA) 4 MG tablet TAKE 1 TABLET TWICE DAILY 180 tablet 3  . ezetimibe (ZETIA) 10 MG tablet Take 1 tablet (10 mg total) by mouth daily. 90 tablet 3  . furosemide (LASIX) 40 MG tablet TAKE 1 TABLET DAILY. AS DIRECTED BY CARDIOLOGIST,  MAY TAKE ADDITIONAL DOSES AS NEEDED 180 tablet 2  . glipiZIDE (GLUCOTROL XL) 2.5 MG 24 hr tablet TAKE 1 TABLET  DAILY WITH LUNCH. 90 tablet 1  . levothyroxine (SYNTHROID, LEVOTHROID) 25 MCG tablet TAKE 1 TABLET EVERY DAY BEFORE BREAKFAST 90 tablet 3  . lisinopril (PRINIVIL,ZESTRIL) 20 MG tablet Take 1 tablet (20 mg total) by mouth daily. 90 tablet 3  . metoprolol tartrate (LOPRESSOR) 25 MG tablet Take 1 tablet (25 mg total) by mouth 2 (two) times daily. (Patient taking differently: Take 12.5 mg by  mouth 2 (two) times daily. ) 180 tablet 3  . nitroGLYCERIN (NITROSTAT) 0.4 MG SL tablet Place 1 tablet (0.4 mg total) under the tongue every 5 (five) minutes as needed for chest pain. If not improved seek medical attention in ER 15 tablet 1  . omeprazole (PRILOSEC) 20 MG capsule Take 20 mg by mouth daily.    . rosuvastatin (CRESTOR) 5 MG tablet Take 1 tablet (5 mg total) by mouth daily. 90 tablet 3  . sitaGLIPtin (JANUVIA) 100 MG tablet Take 1 tablet (100 mg total) by mouth daily. 30 tablet 0  . vitamin B-12 (CYANOCOBALAMIN) 1000 MCG tablet Take 1,000 mcg by mouth every other day.     . warfarin (COUMADIN) 3 MG tablet TAKE AS DIRECTED BY ANTI-COAGULATION CLINIC. 70 tablet 1   No current facility-administered medications on file prior to visit.     Review of Systems  Constitutional: Positive for fatigue. Negative for activity change, appetite change, fever and unexpected weight change.  HENT: Negative for congestion, ear pain, rhinorrhea, sinus pressure and sore throat.   Eyes: Negative for pain, redness and visual disturbance.  Respiratory: Negative for cough, shortness of breath and wheezing.   Cardiovascular: Negative for chest pain and palpitations.  Gastrointestinal: Negative for abdominal pain, blood in stool, constipation and diarrhea.  Endocrine: Negative for polydipsia and polyuria.  Genitourinary: Negative for dysuria, frequency and urgency.  Musculoskeletal: Negative for arthralgias, back pain and myalgias.  Skin: Negative for pallor and rash.  Allergic/Immunologic: Negative for environmental allergies.  Neurological: Negative for dizziness, syncope, light-headedness, numbness and headaches.       Pos for poor balance   Hematological: Negative for adenopathy. Does not bruise/bleed easily.  Psychiatric/Behavioral: Negative for decreased concentration, dysphoric mood and sleep disturbance. The patient is nervous/anxious.        Pos for stressors        Objective:   Physical  Exam  Constitutional: She appears well-developed and well-nourished. No distress.  Slim and well appearing elderly female  Requires some help to get on the table   HENT:  Head: Normocephalic and atraumatic.  Right Ear: External ear normal.  Left Ear: External ear normal.  Mouth/Throat: Oropharynx is clear and moist.  Eyes: Conjunctivae and EOM are normal. Pupils are equal, round, and reactive to light. No scleral icterus.  Neck: Normal range of motion. Neck supple. No JVD present. Carotid bruit is not present. No thyromegaly present.  Cardiovascular: Normal rate, regular rhythm, normal heart sounds and intact distal pulses. Exam reveals no gallop.  Pulmonary/Chest: Effort normal and breath sounds normal. No respiratory distress. She has no wheezes. She exhibits no tenderness.  Abdominal: Soft. Bowel sounds are normal. She exhibits no distension, no abdominal bruit and no mass. There is no tenderness.  Genitourinary: No breast swelling, tenderness, discharge or bleeding.  Genitourinary Comments: Breast exam: No mass, nodules, thickening, tenderness, bulging, retraction, inflamation, nipple discharge or skin changes noted.  No axillary or clavicular LA.      Musculoskeletal: Normal range of motion. She exhibits no edema or tenderness.  Kyphosis   Lymphadenopathy:  She has no cervical adenopathy.  Neurological: She is alert. She has normal reflexes. No cranial nerve deficit. She exhibits normal muscle tone. Coordination normal.  Poor balance   Skin: Skin is warm and dry. No rash noted. No erythema. No pallor.  Solar lentigines diffusely Some sks diffusely  Psychiatric: She has a normal mood and affect.  Mentally sharp today          Assessment & Plan:   Problem List Items Addressed This Visit      Cardiovascular and Mediastinum   Acute on chronic diastolic heart failure (HCC)    Clinically stable       Atrial fibrillation (HCC)    No issues /clinically stable NSR        Chronic diastolic CHF (congestive heart failure) ([place])   Essential hypertension    bp in fair control at this time  BP Readings from Last 1 Encounters:  10/24/17 126/72   No changes needed Disc lifstyle change with low sodium diet and exercise  Labs reviewed         Endocrine   DM2 (diabetes mellitus, type 2) ([place])    Pt can no longer afford copay to see Dr Gabriel Carina but has been stable on Januvia and Glipizide  Ace for renal protection  Reminded to watch for hypoglycemia and check glucose any time she is symptomatic Lab Results  Component Value Date   HGBA1C 6.6 (H) 08/15/2017           Nervous and Auditory   Hearing loss    Continues to decline hearing aides         Musculoskeletal and Integument   Osteoporosis    2 falls/ no fractures Declines dexa or tx  Intol of alendronate in the past  D level is therapeutic         Other   ANEMIA, VITAMIN B12 DEFICIENCY    Lab Results  Component Value Date   VITAMINB12 >1500 (H) 08/01/2016         Anxiety    Better controlled with buspar Not sleeping well  Disc sleep hygeine/expectations       At high risk for falls    Again rev fall precautions Has 24 h care currently Uses walker all the time       HYPERCHOLESTEROLEMIA    Disc goals for lipids and reasons to control them Rev labs with pt Rev low sat fat diet in detail  Currently trying low dose crestor and zetia from cardiology Watching transaminases closely      Relevant Orders   Lipid panel (Completed)   ALT (Completed)   AST (Completed)   Medicare annual wellness visit, subsequent - Primary    Reviewed health habits including diet and exercise and skin cancer prevention Reviewed appropriate screening tests for age  Also reviewed health mt list, fam hx and immunization status , as well as social and family history   See HPI Labs reviewed -lipid today with ast/alt Fall prev discussed  Will look into Td at pharmacy Also discussed shingrix        Routine general medical examination at a health care facility    Reviewed health habits including diet and exercise and skin cancer prevention Reviewed appropriate screening tests for age  Also reviewed health mt list, fam hx and immunization status , as well as social and family history   See HPI Labs reviewed -lipid today with ast/alt Fall prev discussed  Will look into Td at pharmacy Also discussed  shingrix       Transaminitis    Fairly stable in nov Now on crestor- re check today  No symptoms       Relevant Orders   ALT (Completed)   AST (Completed)   Vitamin D deficiency    Vitamin D level is therapeutic with current supplementation Disc importance of this to bone and overall health        Other Visit Diagnoses    Dyslipidemia       Pure hypercholesterolemia       Relevant Orders   Lipid panel (Completed)   ALT (Completed)   AST (Completed)

## 2017-10-24 NOTE — Patient Instructions (Addendum)
If you want a tetanus shot- check with your pharmacy   Try to increase water intake to keep kidneys happy  64 oz of fluids per day- mostly water is the goal   If you feel like you have low blood sugar (clammy/shaky)- try to check it and then eat something and keep me posted   We will check cholesterol and liver numbers today now on the crestor

## 2017-10-25 NOTE — Assessment & Plan Note (Signed)
Continues to decline hearing aides

## 2017-10-25 NOTE — Assessment & Plan Note (Signed)
Clinically stable. 

## 2017-10-25 NOTE — Assessment & Plan Note (Signed)
Reviewed health habits including diet and exercise and skin cancer prevention Reviewed appropriate screening tests for age  Also reviewed health mt list, fam hx and immunization status , as well as social and family history   See HPI Labs reviewed -lipid today with ast/alt Fall prev discussed  Will look into Td at pharmacy Also discussed shingrix

## 2017-10-25 NOTE — Assessment & Plan Note (Signed)
Lab Results  Component Value Date   VITAMINB12 >1500 (H) 08/01/2016

## 2017-10-25 NOTE — Assessment & Plan Note (Signed)
No issues /clinically stable NSR

## 2017-10-25 NOTE — Assessment & Plan Note (Signed)
Fairly stable in nov Now on crestor- re check today  No symptoms

## 2017-10-25 NOTE — Telephone Encounter (Signed)
Pt had her AWV with Dr. Glori Bickers yesterday, please advise

## 2017-10-25 NOTE — Assessment & Plan Note (Signed)
2 falls/ no fractures Declines dexa or tx  Intol of alendronate in the past  D level is therapeutic

## 2017-10-25 NOTE — Assessment & Plan Note (Signed)
Vitamin D level is therapeutic with current supplementation Disc importance of this to bone and overall health  

## 2017-10-25 NOTE — Assessment & Plan Note (Addendum)
Pt can no longer afford copay to see Dr Gabriel Carina but has been stable on Januvia and Glipizide  Ace for renal protection  Reminded to watch for hypoglycemia and check glucose any time she is symptomatic Lab Results  Component Value Date   HGBA1C 6.6 (H) 08/15/2017

## 2017-10-25 NOTE — Assessment & Plan Note (Signed)
Again rev fall precautions Has 24 h care currently Uses walker all the time

## 2017-10-25 NOTE — Assessment & Plan Note (Signed)
Disc goals for lipids and reasons to control them Rev labs with pt Rev low sat fat diet in detail  Currently trying low dose crestor and zetia from cardiology Watching transaminases closely

## 2017-10-25 NOTE — Assessment & Plan Note (Signed)
Better controlled with buspar Not sleeping well  Disc sleep hygeine/expectations

## 2017-10-25 NOTE — Telephone Encounter (Signed)
Will refill electronically  

## 2017-10-25 NOTE — Assessment & Plan Note (Signed)
bp in fair control at this time  BP Readings from Last 1 Encounters:  10/24/17 126/72   No changes needed Disc lifstyle change with low sodium diet and exercise  Labs reviewed

## 2017-11-01 ENCOUNTER — Emergency Department
Admission: EM | Admit: 2017-11-01 | Discharge: 2017-11-01 | Disposition: A | Discharge status: Home / Self Care | Payer: Medicare HMO | Attending: Emergency Medicine | Admitting: Emergency Medicine

## 2017-11-01 ENCOUNTER — Encounter: Payer: Self-pay | Admitting: *Deleted

## 2017-11-01 ENCOUNTER — Other Ambulatory Visit: Payer: Self-pay

## 2017-11-01 DIAGNOSIS — Z79899 Other long term (current) drug therapy: Secondary | ICD-10-CM | POA: Insufficient documentation

## 2017-11-01 DIAGNOSIS — I110 Hypertensive heart disease with heart failure: Secondary | ICD-10-CM | POA: Diagnosis not present

## 2017-11-01 DIAGNOSIS — Z87891 Personal history of nicotine dependence: Secondary | ICD-10-CM | POA: Insufficient documentation

## 2017-11-01 DIAGNOSIS — I259 Chronic ischemic heart disease, unspecified: Secondary | ICD-10-CM | POA: Diagnosis not present

## 2017-11-01 DIAGNOSIS — Z7901 Long term (current) use of anticoagulants: Secondary | ICD-10-CM | POA: Insufficient documentation

## 2017-11-01 DIAGNOSIS — E1165 Type 2 diabetes mellitus with hyperglycemia: Secondary | ICD-10-CM | POA: Diagnosis not present

## 2017-11-01 DIAGNOSIS — E11649 Type 2 diabetes mellitus with hypoglycemia without coma: Secondary | ICD-10-CM | POA: Diagnosis present

## 2017-11-01 DIAGNOSIS — N390 Urinary tract infection, site not specified: Secondary | ICD-10-CM | POA: Insufficient documentation

## 2017-11-01 DIAGNOSIS — I10 Essential (primary) hypertension: Secondary | ICD-10-CM | POA: Diagnosis not present

## 2017-11-01 DIAGNOSIS — I5032 Chronic diastolic (congestive) heart failure: Secondary | ICD-10-CM | POA: Diagnosis not present

## 2017-11-01 DIAGNOSIS — Z7984 Long term (current) use of oral hypoglycemic drugs: Secondary | ICD-10-CM | POA: Diagnosis not present

## 2017-11-01 DIAGNOSIS — R739 Hyperglycemia, unspecified: Secondary | ICD-10-CM

## 2017-11-01 LAB — URINALYSIS, COMPLETE (UACMP) WITH MICROSCOPIC
BILIRUBIN URINE: NEGATIVE
Bacteria, UA: NONE SEEN
Glucose, UA: >=500 mg/dL — AB
Hgb urine dipstick: NEGATIVE
KETONES UR: NEGATIVE mg/dL
Nitrite: NEGATIVE
Protein, ur: 30 mg/dL — AB
Specific Gravity, Urine: 1.017 (ref 1.005–1.030)
pH: 5 (ref 5.0–8.0)

## 2017-11-01 LAB — BLOOD GAS, VENOUS
ACID-BASE EXCESS: 3.3 mmol/L — AB (ref 0.0–2.0)
Bicarbonate: 26.9 mmol/L (ref 20.0–28.0)
O2 Saturation: 96.8 %
PH VEN: 7.47 — AB (ref 7.250–7.430)
Patient temperature: 37
pCO2, Ven: 37 mmHg — ABNORMAL LOW (ref 44.0–60.0)
pO2, Ven: 83 mmHg — ABNORMAL HIGH (ref 32.0–45.0)

## 2017-11-01 LAB — CBC
HCT: 43.9 % (ref 35.0–47.0)
HEMOGLOBIN: 14.5 g/dL (ref 12.0–16.0)
MCH: 30.3 pg (ref 26.0–34.0)
MCHC: 33.1 g/dL (ref 32.0–36.0)
MCV: 91.5 fL (ref 80.0–100.0)
Platelets: 231 10*3/uL (ref 150–440)
RBC: 4.8 MIL/uL (ref 3.80–5.20)
RDW: 14.7 % — ABNORMAL HIGH (ref 11.5–14.5)
WBC: 6.8 10*3/uL (ref 3.6–11.0)

## 2017-11-01 LAB — BASIC METABOLIC PANEL
ANION GAP: 16 — AB (ref 5–15)
BUN: 26 mg/dL — ABNORMAL HIGH (ref 6–20)
CHLORIDE: 102 mmol/L (ref 101–111)
CO2: 21 mmol/L — AB (ref 22–32)
Calcium: 9.5 mg/dL (ref 8.9–10.3)
Creatinine, Ser: 1.13 mg/dL — ABNORMAL HIGH (ref 0.44–1.00)
GFR calc non Af Amer: 42 mL/min — ABNORMAL LOW (ref >60)
GFR, EST AFRICAN AMERICAN: 48 mL/min — AB (ref >60)
Glucose, Bld: 338 mg/dL — ABNORMAL HIGH (ref 65–99)
Potassium: 4.2 mmol/L (ref 3.5–5.1)
Sodium: 139 mmol/L (ref 135–145)

## 2017-11-01 LAB — GLUCOSE, CAPILLARY
Glucose-Capillary: 299 mg/dL — ABNORMAL HIGH (ref 65–99)
Glucose-Capillary: 155 mg/dL — ABNORMAL HIGH (ref 65–99)

## 2017-11-01 LAB — TROPONIN I

## 2017-11-01 MED ORDER — CEFTRIAXONE SODIUM 1 G IJ SOLR
1.0000 g | Freq: Once | INTRAMUSCULAR | Status: AC
  Administered 2017-11-01: 1 g via INTRAVENOUS
  Filled 2017-11-01: qty 10

## 2017-11-01 MED ORDER — CLONIDINE HCL 0.1 MG PO TABS
0.2000 mg | ORAL_TABLET | Freq: Once | ORAL | Status: AC
  Administered 2017-11-01: 0.2 mg via ORAL
  Filled 2017-11-01: qty 2

## 2017-11-01 MED ORDER — DOXAZOSIN MESYLATE 4 MG PO TABS: 4.0000 mg | ORAL_TABLET | Freq: Every day | ORAL | Status: DC

## 2017-11-01 MED ORDER — HYDRALAZINE HCL 50 MG PO TABS
100.0000 mg | ORAL_TABLET | Freq: Once | ORAL | Status: AC
  Administered 2017-11-01: 100 mg via ORAL
  Filled 2017-11-01: qty 2

## 2017-11-01 MED ORDER — CEPHALEXIN 250 MG PO CAPS: 250.0000 mg | ORAL_CAPSULE | Freq: Two times a day (BID) | ORAL | 0 refills | Status: AC

## 2017-11-01 MED ORDER — METOPROLOL TARTRATE 25 MG PO TABS
25.0000 mg | ORAL_TABLET | Freq: Once | ORAL | Status: DC
Start: 2017-11-01 — End: 2017-11-01

## 2017-11-01 MED ORDER — SODIUM CHLORIDE 0.9 % IV BOLUS (SEPSIS)
1000.0000 mL | Freq: Once | INTRAVENOUS | Status: AC
  Administered 2017-11-01: 1000 mL via INTRAVENOUS

## 2017-11-01 NOTE — ED Triage Notes (Signed)
Pt states she was hypoglycemic this morning when she woke. Pt states her CBG 48 @ home. Pt ate and drank a great deal. Pt's CBG recheck at home CBG 312 on a different machine after eating. Pt c/o feeling shaky and jittery. Pt also has c/o hypertension 217/97 @ home. Pt has taken all of her home meds today.

## 2017-11-01 NOTE — ED Notes (Signed)
Pt up to toilet 

## 2017-11-01 NOTE — ED Provider Notes (Signed)
[facility] Emergency Department Provider Note  Time seen: 8:20 PM  I have reviewed the triage vital signs and the nursing notes.   HISTORY  Chief Complaint Hypoglycemia    HPI Bianca Mueller is a 84 y.o. female with a past medical history of arthritis, CHF, gastric reflux, hypertension, hyperlipidemia, diabetes, presents to the emergency department feeling "shaky."  According to the patient this morning she states her blood glucose was low at 48, she was able to eat and drink, her blood glucose on recheck several hours later went up to 300.  States she has been feeling a little shaky and jittery also complaining of frequent urination and was hypertensive at home around 740 systolic which concerned the patient so she came to the emergency department for evaluation.  Patient denies any fever, cough, congestion, abdominal pain, vomiting or diarrhea.  States frequent urination but denies dysuria.   Past Medical History:  Diagnosis Date  . Arthritis    "hips" (08/07/2014)  . CAD (coronary artery disease)    a. 1989 s/p MI, TPA, PTCA;  b. 06/2014 NSTEMI/Cath: LM nl, LAD 30-40, D2 90, small, LCX 195m OM1 90, RCA Cs2+ 70-80p, 50-668m90d (? ruptured plaque), EF 55-60%-->Med Rx.  . Chronic diastolic CHF (congestive heart failure) (H[place]   a. 07/2014 Echo: EF 60-65%, Gr 1 DD, mild to mod TR, PASP 3746m.  . Diverticulosis   . Elevated LFTs    a. 06/2014 Abd U/S: no GB pathology.  . GMarland KitchenRD (gastroesophageal reflux disease)   . Hx of adenomatous colonic polyps 2010   last colonscopy 2010,Dr EllVira Agarno need for further  colonoscopy given age  . Hypercalcemia   . Hyperlipidemia   . Hypertension   . Myocardial infarction (HCCMidwest City989  . Osteoporosis   . PAF/PAFlutter    a. 06/2014->amio and coumadin started.  . Type II diabetes mellitus (HCC[place] . Zoster    past    Patient Active Problem List   Diagnosis Date Noted  . Medicare annual wellness visit, subsequent  10/24/2017  . Insomnia disorder 08/15/2017  . Long term (current) use of anticoagulants 06/26/2017  . At high risk for falls 11/16/2016  . Routine general medical examination at a health care facility 08/03/2016  . Anxiety 11/08/2015  . Gait disorder 09/21/2015  . History of fall 08/24/2015  . Hearing loss 08/24/2015  . Acute on chronic diastolic heart failure (HCCSan [place]1/20/2015  . Encounter for therapeutic drug monitoring 08/06/2014  . Chronic diastolic CHF (congestive heart failure) ([place]CC[place] . CAD (coronary artery disease)   . Atrial fibrillation (HCCS[place] . Transaminitis 07/20/2014  . Atrial flutter (HCCPine Grove Mills1/10/2013  . NSTEMI (non-ST elevated myocardial infarction) (HCCFab[place]0/31/2015  . Encounter for Medicare annual wellness exam 08/19/2013  . Other screening mammogram 05/17/2012  . Adult failure to thrive 05/17/2011  . DM2 (diabetes mellitus, type 2) (HCCRem[place]5/29/2012  . ANEMIA, VITAMIN B12 DEFICIENCY 09/26/2010  . PEDAL EDEMA 03/16/2010  . SHE[place] Arriba[place]ERGY 09/06/2009  . Vitamin D deficiency 03/02/2009  . DIVERTICULITIS, COLON 11/12/2008  . COLONIC POLYPS, HX OF 09/02/2008  . HYPERCHOLESTEROLEMIA 12/10/2006  . Essential hypertension 12/10/2006  . Osteoporosis 03/18/2002    Past Surgical History:  Procedure Laterality Date  . CARDIAC CATHETERIZATION  07/22/2014  . CORONARY ANGIOPLASTY  1989   "@ [facility]"  . LEFT HEART CATHETERIZATION WITH CORONARY ANGIOGRAM N/A 07/22/2014   Procedure: LEFT HEART CATHETERIZATION WITH CORONARY ANGIOGRAM;  Surgeon: HenSinclair GroomsD;  Location: [facility];  Service: Cardiovascular;  Laterality: N/A;  . SHOULDER OPEN ROTATOR CUFF REPAIR Right 1980's  . VAGINAL HYSTERECTOMY  1970's?   ovaries intact    Prior to Admission medications   Medication Sig Start Date End Date Taking? Authorizing Provider  ACCU-CHEK AVIVA PLUS test strip USE AS INSTRUCTED TO CHECK BLOOD SUGAR ONCE DAILY OR AS NEEDED.   03/17/16   Tower, Wynelle Fanny, MD  ACCU-CHEK FASTCLIX  LANCETS MISC Use as instructed to test blood sugar once daily or as needed.  Diagnosis:  E11.9   Non insulin-dependent. 12/08/14   Tower, Wynelle Fanny, MD  ALPRAZolam Duanne Moron) 0.25 MG tablet Take 0.25 mg by mouth as needed. 07/20/17   [provider]  amiodarone (PACERONE) 200 MG tablet Take 200 mg by mouth daily. 11/05/14   [provider]  Blood Glucose Monitoring Suppl (ACCU-CHEK AVIVA PLUS) W/DEVICE KIT Use as instructed to test blood sugar once daily or as needed.  Diagnosis:  E11.9  Non insulin-dependent. 12/08/14   Tower, Wynelle Fanny, MD  busPIRone (BUSPAR) 30 MG tablet Take 1 tablet (30 mg total) by mouth 2 (two) times daily. 09/24/17   Tonia Ghent, MD  cholecalciferol (VITAMIN D) 1000 UNITS tablet Take 2,000 Units by mouth daily at 12 noon.     [provider]  clonazePAM (KLONOPIN) 0.5 MG tablet Take 1 tablet (0.5 mg total) by mouth at bedtime. 08/15/17   Tower, Wynelle Fanny, MD  cloNIDine (CATAPRES) 0.2 MG tablet TAKE 1 TABLET TWICE DAILY 10/25/17   Tower, Wynelle Fanny, MD  docusate sodium 100 MG CAPS Take 100 mg by mouth 2 (two) times daily. 07/29/14   Barrett, Evelene Croon, PA-C  doxazosin (CARDURA) 4 MG tablet TAKE 1 TABLET TWICE DAILY 09/24/17   Tower, Wynelle Fanny, MD  ezetimibe (ZETIA) 10 MG tablet Take 1 tablet (10 mg total) by mouth daily. 08/22/17   Minna Merritts, MD  furosemide (LASIX) 40 MG tablet TAKE 1 TABLET DAILY. AS DIRECTED BY CARDIOLOGIST,  MAY TAKE ADDITIONAL DOSES AS NEEDED 09/24/17   Tower, Roque Lias A, MD  glipiZIDE (GLUCOTROL XL) 2.5 MG 24 hr tablet TAKE 1 TABLET DAILY WITH LUNCH. 09/24/17   Tower, Wynelle Fanny, MD  hydrALAZINE (APRESOLINE) 100 MG tablet TAKE 1 TABLET THREE TIMES DAILY 10/25/17   Tower, Wynelle Fanny, MD  levothyroxine (SYNTHROID, LEVOTHROID) 25 MCG tablet TAKE 1 TABLET EVERY DAY BEFORE BREAKFAST 08/24/17   Tower, Wynelle Fanny, MD  lisinopril (PRINIVIL,ZESTRIL) 20 MG tablet Take 1 tablet (20 mg total) by mouth daily. 05/07/17   Tower, Wynelle Fanny, MD  metoprolol tartrate (LOPRESSOR) 25  MG tablet Take 1 tablet (25 mg total) by mouth 2 (two) times daily. Patient taking differently: Take 12.5 mg by mouth 2 (two) times daily.  09/24/17   Minna Merritts, MD  nitroGLYCERIN (NITROSTAT) 0.4 MG SL tablet Place 1 tablet (0.4 mg total) under the tongue every 5 (five) minutes as needed for chest pain. If not improved seek medical attention in ER 11/03/16   Tower, Wynelle Fanny, MD  omeprazole (PRILOSEC) 20 MG capsule Take 20 mg by mouth daily.    [provider]  rosuvastatin (CRESTOR) 5 MG tablet Take 1 tablet (5 mg total) by mouth daily. 08/22/17   Minna Merritts, MD  sitaGLIPtin (JANUVIA) 100 MG tablet Take 1 tablet (100 mg total) by mouth daily. 03/22/17   Tower, Wynelle Fanny, MD  vitamin B-12 (CYANOCOBALAMIN) 1000 MCG tablet Take 1,000 mcg by mouth every other day.  [provider]  warfarin (COUMADIN) 3 MG tablet TAKE AS DIRECTED BY ANTI-COAGULATION CLINIC. 09/24/17   Tower, Wynelle Fanny, MD    Allergies  Allergen Reactions  . Alendronate Sodium Other (See Comments)    unknown  . Doxycycline Other (See Comments)    Inc LFT  . Isosorbide Nitrate Other (See Comments)  . Lidocaine Nausea Only    Given at Dentist office  . Metformin And Related     Decreases appetite resulting in weight loss  . Metronidazole Nausea And Vomiting    REACTION: sick/ GI  . Nifedipine Other (See Comments)    Family History  Problem Relation Age of Onset  . Heart disease Mother        CAD  . Heart disease Father        CAD  . Heart disease Brother        CAD    Social History Social History   Tobacco Use  . Smoking status: Former Smoker    Packs/day: 0.25    Years: 30.00    Pack years: 7.50    Types: Cigarettes  . Smokeless tobacco: Never Used  . Tobacco comment: "quit smoking in the 1980's"  Substance Use Topics  . Alcohol use: No    Alcohol/week: 0.0 oz  . Drug use: No    Review of Systems Constitutional: Negative for fever.  States she is feeling shaky. Eyes: Negative  for visual complaints ENT: Negative for recent illness/congestion Cardiovascular: Negative for chest pain. Respiratory: Negative for shortness of breath. Gastrointestinal: Negative for abdominal pain, vomiting and diarrhea. Genitourinary: Negative for dysuria, positive urinary frequency since last night. Musculoskeletal: Negative for musculoskeletal complaints Skin: Negative for skin complaints  Neurological: Negative for headache All other ROS negative  ____________________________________________   PHYSICAL EXAM:  VITAL SIGNS: ED Triage Vitals  Enc Vitals Group     BP 11/01/17 1533 (!) 225/96     Pulse Rate 11/01/17 1533 86     Resp 11/01/17 1533 16     Temp 11/01/17 1533 99 F (37.2 C)     Temp Source 11/01/17 1533 Oral     SpO2 11/01/17 1533 96 %     Weight 11/01/17 1534 127 lb (57.6 kg)     Height 11/01/17 1534 5' 1"  (1.549 m)     Head Circumference --      Peak Flow --      Pain Score --      Pain Loc --      Pain Edu? --      Excl. in [place]? --     Constitutional: Alert and oriented. Well appearing and in no distress. Eyes: Normal exam ENT   Head: Normocephalic and atraumatic.   Mouth/Throat: Mucous membranes are moist. Cardiovascular: Normal rate, regular rhythm.  Respiratory: Normal respiratory effort without tachypnea nor retractions. Breath sounds are clear Gastrointestinal: Soft and nontender. No distention.   Musculoskeletal: Nontender with normal range of motion in all extremities Neurologic:  Normal speech and language. No gross focal neurologic deficits  Skin:  Skin is warm, dry and intact.  Psychiatric: Mood and affect are normal.   ____________________________________________    EKG  EKG reviewed and interpreted by myself shows normal sinus rhythm 89 bpm with a narrow QRS, normal axis, normal intervals, no significant ST changes.  ____________________________________________   INITIAL IMPRESSION / ASSESSMENT AND PLAN / ED  COURSE  Pertinent labs & imaging results that were available during my care of the patient were reviewed by  me and considered in my medical decision making (see chart for details).  Patient presents to the emergency department for feeling shaky elevated blood pressure and problems with her blood glucose today.  States initially blood glucose was low, now it is high.  Denies dysuria but states frequent urination.  Largely negative review of systems otherwise.  Differential would include hyperglycemia, hypoglycemia, dehydration, infectious etiology, metabolic abnormality, hypertension.  Patient remains somewhat hypertensive, we will dose the patient's normal blood pressure evening medications.  Patient's urinalysis shows white blood cells most consistent with urinary tract infection but also positive for glucose.  We will treat with IV fluids, IV Rocephin and send a urine culture.  Patient has a mild anion gap of 16 we will check a VBG as a precaution and dose IV fluids.  Overall the patient appears well.  States she feels much better than she did when she came in.  Patient's pH is 7.47 and VBG.  We will recheck a fingerstick blood glucose.  Patient has received IV fluids as well as IV Rocephin.  Blood pressure currently 197/95 approximately 1 hour after receiving her home medications.  Discussed with patient follow-up with her doctor regarding her continued elevation of blood pressure.  We will discharge on antibiotics for her urinary tract infection.  Urine culture has been sent.  Patient agreeable to this plan of care.  She states she feels well and is ready to go home.  I believe discharge home is a reasonable plan of care for the patient.  ____________________________________________   FINAL CLINICAL IMPRESSION(S) / ED DIAGNOSES  hyperglycemia Urinary tract infection    Harvest Dark, MD 11/01/17 2212

## 2017-11-01 NOTE — Discharge Instructions (Signed)
As we discussed please take your antibiotics as prescribed.  Please drink plenty of fluids over the next several days.  Return to the emergency department for any symptom personally concerning to yourself, otherwise please follow-up with your doctor in the next 2-3 days for recheck/reevaluation.

## 2017-11-01 NOTE — ED Notes (Signed)
Esign not working, pt verbalized discharge instructions and has no questions at this time

## 2017-11-03 LAB — URINE CULTURE

## 2017-11-05 ENCOUNTER — Encounter: Payer: Self-pay | Admitting: Family Medicine

## 2017-11-06 ENCOUNTER — Ambulatory Visit (INDEPENDENT_AMBULATORY_CARE_PROVIDER_SITE_OTHER): Payer: Medicare HMO

## 2017-11-06 DIAGNOSIS — I4891 Unspecified atrial fibrillation: Secondary | ICD-10-CM

## 2017-11-06 DIAGNOSIS — Z7901 Long term (current) use of anticoagulants: Secondary | ICD-10-CM

## 2017-11-06 DIAGNOSIS — I4892 Unspecified atrial flutter: Secondary | ICD-10-CM

## 2017-11-06 LAB — POCT INR: INR: 3.5

## 2017-11-06 NOTE — Patient Instructions (Signed)
INR today 2.4  She is still on Keflex bid through this Thursday.  Keflex typically doesn't interact with coumadin. She is likely supratherapeutic today due to IV Rocephin in the ER.  Will hold today but not decrease dose.    Hold Coumadin today (2/19) and then continue to take 1 pill daily EXCEPT for 1/2 pill on Wednesdays and Fridays.  Recheck in 1 week.    Patient denies any unusual bruising or bleeding and is aware of risks associated with a supratherapeutic level.  Also discussed findings today with patient's daughter, [name], who helps mother manage her medications.  If she has any change in abx therapy, daughter will make me aware in case dosing/recheck schedule need to be adjusted.

## 2017-11-08 ENCOUNTER — Encounter: Payer: Self-pay | Admitting: Family Medicine

## 2017-11-08 ENCOUNTER — Ambulatory Visit (INDEPENDENT_AMBULATORY_CARE_PROVIDER_SITE_OTHER): Payer: Medicare HMO | Admitting: Family Medicine

## 2017-11-08 ENCOUNTER — Ambulatory Visit: Payer: Medicare HMO

## 2017-11-08 VITALS — BP 164/80 | HR 68 | Temp 97.5°F | Wt 124.5 lb

## 2017-11-08 DIAGNOSIS — I5032 Chronic diastolic (congestive) heart failure: Secondary | ICD-10-CM | POA: Diagnosis not present

## 2017-11-08 DIAGNOSIS — F419 Anxiety disorder, unspecified: Secondary | ICD-10-CM

## 2017-11-08 DIAGNOSIS — I10 Essential (primary) hypertension: Secondary | ICD-10-CM | POA: Diagnosis not present

## 2017-11-08 DIAGNOSIS — E1159 Type 2 diabetes mellitus with other circulatory complications: Secondary | ICD-10-CM | POA: Diagnosis not present

## 2017-11-08 DIAGNOSIS — E7800 Pure hypercholesterolemia, unspecified: Secondary | ICD-10-CM | POA: Diagnosis not present

## 2017-11-08 DIAGNOSIS — R358 Other polyuria: Secondary | ICD-10-CM | POA: Insufficient documentation

## 2017-11-08 DIAGNOSIS — R3589 Other polyuria: Secondary | ICD-10-CM

## 2017-11-08 LAB — POC URINALSYSI DIPSTICK (AUTOMATED)
Bilirubin, UA: NEGATIVE
Blood, UA: NEGATIVE
GLUCOSE UA: NEGATIVE
Ketones, UA: NEGATIVE
Nitrite, UA: NEGATIVE
SPEC GRAV UA: 1.015 (ref 1.010–1.025)
UROBILINOGEN UA: 0.2 U/dL
pH, UA: 6 (ref 5.0–8.0)

## 2017-11-08 NOTE — Assessment & Plan Note (Signed)
Seems euvolemic today.  

## 2017-11-08 NOTE — Assessment & Plan Note (Addendum)
Markedly elevated blood pressures the last few weeks, with unclear trigger. Bianca Mueller states mother is taking all her meds as prescribed. No changes today, but I did ask them to start monitoring BP regularly at home and bring in log to f/u office visit with PCP.

## 2017-11-08 NOTE — Patient Instructions (Addendum)
Urinalysis today repeated.  Labs today (fructosamine as we're too soon for A1c). Start monitoring sugars and blood pressure more closely at home and bring log to follow up visit with Dr Glori Bickers in next few weeks.  Ok to try xanax when you're feeling more nervous or shakey.  Try holding zetia for next 2 weeks to see if any improvement. Continue rosuvastatin.  Good to meet you today. I hope you start feeling better.

## 2017-11-08 NOTE — Assessment & Plan Note (Signed)
Possibly worse despite buspar 30mg  bid and klonopin 0.5mg  nightly. I did suggest she try one of her PRN xanax in case she feels shaky, anxious, nervous again.

## 2017-11-08 NOTE — Assessment & Plan Note (Addendum)
Daughter concerned crestor/zetia may be contributing to symptoms however this was started 08/22/2018 and symptoms first presented 1-2 wks ago. I doubt related. I did offer 2 wk trial off zetia to see if any improvement in symptoms. She seems to be tolerating meds well otherwise - no myalgias, stable transaminitis on recent check.

## 2017-11-08 NOTE — Assessment & Plan Note (Signed)
?  hyperglycemia related vs infection - UA/micro suggestive for infection. Will send culture. She just completed 7 day keflex 250mg  BID course.

## 2017-11-08 NOTE — Assessment & Plan Note (Signed)
Overall historically very well controlled and reports compliance with januvia and glipizide 5mg  XL with meal, however recent fluctuations noted without clear cause. No changes made today. We are 1 wk early for A1c - will check fructosamine today to gauge glycemic control over last 6 wks. Polyuria may be related to recent hyperglycemia.

## 2017-11-08 NOTE — Progress Notes (Signed)
BP (!) 164/80 (BP Location: Right Arm, Cuff Size: Normal)   Pulse 68   Temp (!) 97.5 F (36.4 C) (Oral)   Wt 124 lb 8 oz (56.5 kg)   SpO2 98%   BMI 23.52 kg/m    CC: high sugars, elevated blood pressures "I feel shakey" Subjective:    Patient ID: Bianca Mueller, female    DOB: 11-18-1925, 84 y.o.   MRN: [id number]  HPI: Bianca Mueller is a 84 y.o. female presenting on 11/08/2017 for Blood sugar (Fasting blood sugar was over 200 this morning and BP was 180/103. Thursday shaking spell, high BP, blood sugar 300 after eating a lot. Hospital visit Saturday and then frequent urination after, was diagnosed with UTI at ED. Last night pt was tense/couldnt relax, could not sleep, frequent urination. )   Lugene's mother. Here alone today but Lugene is available if needed. Patient of Dr Marliss Coots new to me. Pt and daughter endorse fluctuating blood sugars (48-300s) and blood pressures (up to 010 systolic). Ongoing symptoms over the past week - seen at [facility] 11/01/2017 with dx UTI (frequent urination) although UCx returned MBM - treated with IV rocephin as well as 7d keflex 289m bid course. Workup reviewed.   Main concern is trouble with shaking and difficulty relaxing. Normally on klonopin 0.526mat night for sleep and buspar 3094mID.  Denies fevers/chills, chest pain, coughing, skin rashes, headache, abd pain, diarrhea, nausea/vomiting. Denies myalgias or arthralgias. No flank pain. No dizziness or lightheadedness. Denies slurred speech, unilateral weakness, confusion or AMS.   No missed blood pressure medicines, no missed diabetes medicines, no changes in diet (no increased sodium, sugar, carbs) endorsed by daughter.   Daughter worried cholesterol medicines causing some of these symptoms - zetia and rosuvastatin were started 08/22/2017 ([facility]  Prior well controlled on januvia and glipizide 5mg82m with lunch. Trouble affording to see endocrinologist. She doesn't know how her sugars have been  running. This morning was 200. Doesn't check sugars regularly.  Lab Results  Component Value Date   HGBA1C 6.6 (H) 08/15/2017    Lab Results  Component Value Date   TSH 4.48 08/15/2017    Relevant past medical, surgical, family and social history reviewed and updated as indicated. Interim medical history since our last visit reviewed. Allergies and medications reviewed and updated. Outpatient Medications Prior to Visit  Medication Sig Dispense Refill  . ACCU-CHEK AVIVA PLUS test strip USE AS INSTRUCTED TO CHECK BLOOD SUGAR ONCE DAILY OR AS NEEDED.   100 each 4  . ACCU-CHEK FASTCLIX LANCETS MISC Use as instructed to test blood sugar once daily or as needed.  Diagnosis:  E11.9   Non insulin-dependent. 102 each 4  . ALPRAZolam (XANAX) 0.25 MG tablet Take 0.25 mg by mouth as needed.    . amMarland Kitchenodarone (PACERONE) 200 MG tablet Take 200 mg by mouth daily.    . Blood Glucose Monitoring Suppl (ACCU-CHEK AVIVA PLUS) W/DEVICE KIT Use as instructed to test blood sugar once daily or as needed.  Diagnosis:  E11.9  Non insulin-dependent. 1 kit 0  . busPIRone (BUSPAR) 30 MG tablet Take 1 tablet (30 mg total) by mouth 2 (two) times daily. 180 tablet 1  . cephALEXin (KEFLEX) 250 MG capsule Take 1 capsule (250 mg total) by mouth 2 (two) times daily for 7 days. 14 capsule 0  . cholecalciferol (VITAMIN D) 1000 UNITS tablet Take 2,000 Units by mouth daily at 12 noon.     . clonazePAM (KLONOPIN)  0.5 MG tablet Take 1 tablet (0.5 mg total) by mouth at bedtime. 30 tablet 3  . cloNIDine (CATAPRES) 0.2 MG tablet TAKE 1 TABLET TWICE DAILY 180 tablet 3  . docusate sodium 100 MG CAPS Take 100 mg by mouth 2 (two) times daily. 10 capsule 0  . doxazosin (CARDURA) 4 MG tablet TAKE 1 TABLET TWICE DAILY 180 tablet 3  . ezetimibe (ZETIA) 10 MG tablet Take 1 tablet (10 mg total) by mouth daily. 90 tablet 3  . furosemide (LASIX) 40 MG tablet TAKE 1 TABLET DAILY. AS DIRECTED BY CARDIOLOGIST,  MAY TAKE ADDITIONAL DOSES AS NEEDED 180  tablet 2  . glipiZIDE (GLUCOTROL XL) 2.5 MG 24 hr tablet TAKE 1 TABLET DAILY WITH LUNCH. 90 tablet 1  . hydrALAZINE (APRESOLINE) 100 MG tablet TAKE 1 TABLET THREE TIMES DAILY 270 tablet 3  . levothyroxine (SYNTHROID, LEVOTHROID) 25 MCG tablet TAKE 1 TABLET EVERY DAY BEFORE BREAKFAST 90 tablet 3  . lisinopril (PRINIVIL,ZESTRIL) 20 MG tablet Take 1 tablet (20 mg total) by mouth daily. 90 tablet 3  . metoprolol tartrate (LOPRESSOR) 25 MG tablet Take 1 tablet (25 mg total) by mouth 2 (two) times daily. (Patient taking differently: Take 12.5 mg by mouth 2 (two) times daily. ) 180 tablet 3  . nitroGLYCERIN (NITROSTAT) 0.4 MG SL tablet Place 1 tablet (0.4 mg total) under the tongue every 5 (five) minutes as needed for chest pain. If not improved seek medical attention in ER 15 tablet 1  . omeprazole (PRILOSEC) 20 MG capsule Take 20 mg by mouth daily.    . rosuvastatin (CRESTOR) 5 MG tablet Take 1 tablet (5 mg total) by mouth daily. 90 tablet 3  . sitaGLIPtin (JANUVIA) 100 MG tablet Take 1 tablet (100 mg total) by mouth daily. 30 tablet 0  . vitamin B-12 (CYANOCOBALAMIN) 1000 MCG tablet Take 1,000 mcg by mouth every other day.     . warfarin (COUMADIN) 3 MG tablet TAKE AS DIRECTED BY ANTI-COAGULATION CLINIC. 70 tablet 1   No facility-administered medications prior to visit.      Per HPI unless specifically indicated in ROS section below Review of Systems     Objective:    BP (!) 164/80 (BP Location: Right Arm, Cuff Size: Normal)   Pulse 68   Temp (!) 97.5 F (36.4 C) (Oral)   Wt 124 lb 8 oz (56.5 kg)   SpO2 98%   BMI 23.52 kg/m   Wt Readings from Last 3 Encounters:  11/08/17 124 lb 8 oz (56.5 kg)  11/01/17 127 lb (57.6 kg)  10/24/17 127 lb 8 oz (57.8 kg)    Physical Exam  Constitutional: She appears well-developed and well-nourished. No distress.  Walks with walker  HENT:  Head: Normocephalic and atraumatic.  Mouth/Throat: Oropharynx is clear and moist. No oropharyngeal exudate.    Eyes: Conjunctivae and EOM are normal. Pupils are equal, round, and reactive to light. No scleral icterus.  Neck: Normal range of motion. Neck supple.  Cardiovascular: Normal rate, regular rhythm, normal heart sounds and intact distal pulses.  No murmur heard. Pulmonary/Chest: Effort normal and breath sounds normal. No respiratory distress. She has no wheezes. She has no rales.  Abdominal: Soft. Bowel sounds are normal. She exhibits no distension and no mass. There is no tenderness. There is no rebound and no guarding.  Musculoskeletal: She exhibits no edema.  Skin: Skin is warm and dry. No rash noted. No erythema.  Psychiatric: She has a normal mood and affect.  Nursing note  and vitals reviewed.  Results for orders placed or performed in visit on 11/08/17  POCT Urinalysis Dipstick (Automated)  Result Value Ref Range   Color, UA yellow    Clarity, UA clear    Glucose, UA negative    Bilirubin, UA negative    Ketones, UA negative    Spec Grav, UA 1.015 1.010 - 1.025   Blood, UA negative    pH, UA 6.0 5.0 - 8.0   Protein, UA trace    Urobilinogen, UA 0.2 0.2 or 1.0 E.U./dL   Nitrite, UA negative    Leukocytes, UA Large (3+) (A) Negative   Lab Results  Component Value Date   ALT 48 (H) 10/24/2017   AST 52 (H) 10/24/2017   ALKPHOS 47 08/15/2017   BILITOT 0.5 08/15/2017    Lab Results  Component Value Date   CHOL 132 10/24/2017   HDL 45.70 10/24/2017   LDLCALC 96 01/04/2016   LDLDIRECT 63.0 10/24/2017   TRIG 228.0 (H) 10/24/2017   CHOLHDL 3 10/24/2017    Lab Results  Component Value Date   CREATININE 1.13 (H) 11/01/2017   BUN 26 (H) 11/01/2017   NA 139 11/01/2017   K 4.2 11/01/2017   CL 102 11/01/2017   CO2 21 (L) 11/01/2017       Assessment & Plan:   Problem List Items Addressed This Visit    Anxiety    Possibly worse despite buspar 39m bid and klonopin 0.531mnightly. I did suggest she try one of her PRN xanax in case she feels shaky, anxious, nervous again.        Chronic diastolic CHF (congestive heart failure) (HCC)    Seems euvolemic today.      [place] 2[place]   Overall historically very well controlled and reports compliance with januvia and glipizide 64m31mL with meal, however recent fluctuations noted without clear cause. No changes made today. We are 1 wk early for A1c - will check fructosamine today to gauge glycemic control over last 6 wks. Polyuria may be related to recent hyperglycemia.       Relevant Orders   Fructosamine   Essential hypertension    Markedly elevated blood pressures the last few weeks, with unclear trigger. Lugene states mother is taking all her meds as prescribed. No changes today, but I did ask them to start monitoring BP regularly at home and bring in log to f/u office visit with PCP.       HYPERCHOLESTEROLEMIA    Daughter concerned crestor/zetia may be contributing to symptoms however this was started 08/22/2018 and symptoms first presented 1-2 wks ago. I doubt related. I did offer 2 wk trial off zetia to see if any improvement in symptoms. She seems to be tolerating meds well otherwise - no myalgias, stable transaminitis on recent check.      Polyuria - Primary    ?hyperglycemia related vs infection - UA/micro suggestive for infection. Will send culture. She just completed 7 day keflex 250m71mD course.       Relevant Orders   POCT Urinalysis Dipstick (Automated) (Completed)   Urine Culture       No orders of the defined types were placed in this encounter.  Orders Placed This Encounter  Procedures  . Urine Culture  . Fructosamine  . POCT Urinalysis Dipstick (Automated)    Follow up plan: No Follow-up on file.  JaviRia Bush

## 2017-11-09 LAB — URINE CULTURE
MICRO NUMBER: 90230659
Result:: NO GROWTH
SPECIMEN QUALITY:: ADEQUATE

## 2017-11-12 LAB — FRUCTOSAMINE: Fructosamine: 328 umol/L — ABNORMAL HIGH (ref 190–270)

## 2017-11-13 ENCOUNTER — Other Ambulatory Visit (INDEPENDENT_AMBULATORY_CARE_PROVIDER_SITE_OTHER): Payer: Medicare HMO

## 2017-11-13 DIAGNOSIS — I4891 Unspecified atrial fibrillation: Secondary | ICD-10-CM

## 2017-11-13 DIAGNOSIS — R627 Adult failure to thrive: Secondary | ICD-10-CM

## 2017-11-13 DIAGNOSIS — Z5181 Encounter for therapeutic drug level monitoring: Secondary | ICD-10-CM

## 2017-11-13 LAB — POCT INR: INR: 2.8

## 2017-11-20 ENCOUNTER — Ambulatory Visit: Payer: Self-pay

## 2017-11-20 DIAGNOSIS — I4891 Unspecified atrial fibrillation: Secondary | ICD-10-CM

## 2017-11-20 DIAGNOSIS — Z7901 Long term (current) use of anticoagulants: Secondary | ICD-10-CM

## 2017-11-20 DIAGNOSIS — I4892 Unspecified atrial flutter: Secondary | ICD-10-CM

## 2017-11-23 ENCOUNTER — Other Ambulatory Visit: Payer: Self-pay | Admitting: *Deleted

## 2017-11-23 NOTE — Telephone Encounter (Signed)
F/u scheduled 03/06/18, last filled on 08/15/17 #30 tabs with 3 additional refills

## 2017-11-24 MED ORDER — CLONAZEPAM 0.5 MG PO TABS: 0.5000 mg | ORAL_TABLET | Freq: Every day | ORAL | 3 refills | Status: DC

## 2017-12-06 ENCOUNTER — Ambulatory Visit (INDEPENDENT_AMBULATORY_CARE_PROVIDER_SITE_OTHER): Payer: Medicare HMO | Admitting: General Practice

## 2017-12-06 DIAGNOSIS — I4892 Unspecified atrial flutter: Secondary | ICD-10-CM

## 2017-12-06 DIAGNOSIS — I4891 Unspecified atrial fibrillation: Secondary | ICD-10-CM | POA: Diagnosis not present

## 2017-12-06 DIAGNOSIS — Z7901 Long term (current) use of anticoagulants: Secondary | ICD-10-CM | POA: Diagnosis not present

## 2017-12-06 LAB — POCT INR: INR: 2.5

## 2017-12-06 NOTE — Patient Instructions (Addendum)
Pre visit review using our clinic review tool, if applicable. No additional management support is needed unless otherwise documented below in the visit note.  Continue on same dose (3mg  daily EXCEPT for 1.5mg  on Wed and Fridays)  Will recheck in 3-4 weeks.  Daughter, Lugene notified of results and instructions for recheck.

## 2017-12-11 ENCOUNTER — Ambulatory Visit: Payer: Medicare HMO

## 2017-12-18 ENCOUNTER — Encounter: Payer: Self-pay | Admitting: Family Medicine

## 2017-12-18 MED ORDER — GLUCOSE BLOOD VI STRP: ORAL_STRIP | 3 refills | Status: AC

## 2017-12-23 ENCOUNTER — Other Ambulatory Visit: Payer: Self-pay | Admitting: Family Medicine

## 2018-01-03 ENCOUNTER — Ambulatory Visit (INDEPENDENT_AMBULATORY_CARE_PROVIDER_SITE_OTHER): Payer: Medicare HMO | Admitting: General Practice

## 2018-01-03 DIAGNOSIS — I4892 Unspecified atrial flutter: Secondary | ICD-10-CM | POA: Diagnosis not present

## 2018-01-03 DIAGNOSIS — I4891 Unspecified atrial fibrillation: Secondary | ICD-10-CM

## 2018-01-03 DIAGNOSIS — Z7901 Long term (current) use of anticoagulants: Secondary | ICD-10-CM | POA: Diagnosis not present

## 2018-01-03 LAB — POCT INR: INR: 3.8

## 2018-01-03 NOTE — Patient Instructions (Addendum)
Pre visit review using our clinic review tool, if applicable. No additional management support is needed unless otherwise documented below in the visit note.  Hold coumadin today (4/18) and then continue on same dose (3mg  daily EXCEPT for 1.5mg  on Wed and Fridays)  Will recheck in 2 weeks.  Daughter, Lugene notified of results and instructions for recheck.

## 2018-01-08 ENCOUNTER — Telehealth (INDEPENDENT_AMBULATORY_CARE_PROVIDER_SITE_OTHER): Payer: Medicare HMO

## 2018-01-08 ENCOUNTER — Other Ambulatory Visit: Payer: Medicare HMO

## 2018-01-08 DIAGNOSIS — R4182 Altered mental status, unspecified: Secondary | ICD-10-CM | POA: Diagnosis not present

## 2018-01-08 DIAGNOSIS — R8290 Unspecified abnormal findings in urine: Secondary | ICD-10-CM | POA: Diagnosis not present

## 2018-01-08 LAB — POC URINALSYSI DIPSTICK (AUTOMATED)
Bilirubin, UA: NEGATIVE
Blood, UA: NEGATIVE
Glucose, UA: NEGATIVE
KETONES UA: NEGATIVE
NITRITE UA: NEGATIVE
PH UA: 6 (ref 5.0–8.0)
PROTEIN UA: NEGATIVE
Spec Grav, UA: 1.02 (ref 1.010–1.025)
UROBILINOGEN UA: 0.2 U/dL

## 2018-01-08 MED ORDER — CEPHALEXIN 250 MG PO CAPS: 250.0000 mg | ORAL_CAPSULE | Freq: Two times a day (BID) | ORAL | 0 refills | Status: DC

## 2018-01-08 NOTE — Telephone Encounter (Signed)
Urine sample was brought in.  Results listed under POCT urinalysis.  Urine culture drawn up and placed in the lab.

## 2018-01-08 NOTE — Telephone Encounter (Addendum)
Lugene pts daughter said that pt woke up from nap and pt knows who she is and who Stacy her granddaughter is and then will say something that does not make sense.No weakness in extremities and face looks normal. Pt is drinking water and talking normal and then pointed to her husbands boot on his leg and said who is that talking about the boot. Marzetta Board is going to try to get urine specimen to bring to the office. Lugene said hx when pt has UTI she does not exhibit symptoms such as burning, pain or going to the bathroom more often. Lugene will let us know if Marzetta Board is successful in collecting a specimen. FYI to Dr Glori Bickers.

## 2018-01-08 NOTE — Telephone Encounter (Signed)
Sounds good- please run UA when it gets here and let me know  Watch her closely -make sure she is hydrated  Thanks

## 2018-01-08 NOTE — Telephone Encounter (Signed)
Sending cephalexin to pharmacy  Please keep me posted  Pending culture  Thanks

## 2018-01-08 NOTE — Addendum Note (Signed)
Addended by: Loura Pardon A on: 01/08/2018 05:22 PM   Modules accepted: Orders

## 2018-01-09 LAB — URINE CULTURE
MICRO NUMBER: 90495845
SPECIMEN QUALITY: ADEQUATE

## 2018-01-10 ENCOUNTER — Encounter: Payer: Self-pay | Admitting: Cardiovascular Disease

## 2018-01-15 ENCOUNTER — Encounter: Payer: Self-pay | Admitting: Family Medicine

## 2018-01-16 ENCOUNTER — Encounter: Payer: Self-pay | Admitting: Family Medicine

## 2018-01-16 ENCOUNTER — Ambulatory Visit (INDEPENDENT_AMBULATORY_CARE_PROVIDER_SITE_OTHER): Payer: Medicare HMO | Admitting: Family Medicine

## 2018-01-16 ENCOUNTER — Ambulatory Visit (INDEPENDENT_AMBULATORY_CARE_PROVIDER_SITE_OTHER): Payer: Medicare HMO

## 2018-01-16 VITALS — BP 126/78 | HR 55 | Temp 98.2°F | Ht 61.0 in | Wt 126.2 lb

## 2018-01-16 DIAGNOSIS — F419 Anxiety disorder, unspecified: Secondary | ICD-10-CM

## 2018-01-16 DIAGNOSIS — D518 Other vitamin B12 deficiency anemias: Secondary | ICD-10-CM

## 2018-01-16 DIAGNOSIS — R410 Disorientation, unspecified: Secondary | ICD-10-CM | POA: Diagnosis not present

## 2018-01-16 DIAGNOSIS — E1159 Type 2 diabetes mellitus with other circulatory complications: Secondary | ICD-10-CM

## 2018-01-16 DIAGNOSIS — I4891 Unspecified atrial fibrillation: Secondary | ICD-10-CM

## 2018-01-16 DIAGNOSIS — Z7901 Long term (current) use of anticoagulants: Secondary | ICD-10-CM | POA: Diagnosis not present

## 2018-01-16 DIAGNOSIS — I4892 Unspecified atrial flutter: Secondary | ICD-10-CM

## 2018-01-16 DIAGNOSIS — I10 Essential (primary) hypertension: Secondary | ICD-10-CM | POA: Diagnosis not present

## 2018-01-16 DIAGNOSIS — E7800 Pure hypercholesterolemia, unspecified: Secondary | ICD-10-CM

## 2018-01-16 DIAGNOSIS — N3000 Acute cystitis without hematuria: Secondary | ICD-10-CM | POA: Diagnosis not present

## 2018-01-16 DIAGNOSIS — F0390 Unspecified dementia without behavioral disturbance: Secondary | ICD-10-CM | POA: Insufficient documentation

## 2018-01-16 DIAGNOSIS — N390 Urinary tract infection, site not specified: Secondary | ICD-10-CM | POA: Insufficient documentation

## 2018-01-16 LAB — COMPREHENSIVE METABOLIC PANEL
ALBUMIN: 4.3 g/dL (ref 3.5–5.2)
ALK PHOS: 57 U/L (ref 39–117)
ALT: 75 U/L — ABNORMAL HIGH (ref 0–35)
AST: 71 U/L — ABNORMAL HIGH (ref 0–37)
BUN: 23 mg/dL (ref 6–23)
CALCIUM: 9.7 mg/dL (ref 8.4–10.5)
CHLORIDE: 102 meq/L (ref 96–112)
CO2: 29 mEq/L (ref 19–32)
Creatinine, Ser: 1.16 mg/dL (ref 0.40–1.20)
GFR: 46.71 mL/min — ABNORMAL LOW (ref >60.00)
Glucose, Bld: 127 mg/dL — ABNORMAL HIGH (ref 70–99)
POTASSIUM: 3.7 meq/L (ref 3.5–5.1)
Sodium: 141 mEq/L (ref 135–145)
TOTAL PROTEIN: 8 g/dL (ref 6.0–8.3)
Total Bilirubin: 0.5 mg/dL (ref 0.2–1.2)

## 2018-01-16 LAB — TSH: TSH: 5.38 u[IU]/mL — AB (ref 0.35–4.50)

## 2018-01-16 LAB — CBC WITH DIFFERENTIAL/PLATELET
BASOS PCT: 0.6 % (ref 0.0–3.0)
Basophils Absolute: 0 10*3/uL (ref 0.0–0.1)
EOS PCT: 1 % (ref 0.0–5.0)
Eosinophils Absolute: 0.1 10*3/uL (ref 0.0–0.7)
HEMATOCRIT: 42.6 % (ref 36.0–46.0)
HEMOGLOBIN: 14.1 g/dL (ref 12.0–15.0)
LYMPHS PCT: 31.1 % (ref 12.0–46.0)
Lymphs Abs: 2.2 10*3/uL (ref 0.7–4.0)
MCHC: 33 g/dL (ref 30.0–36.0)
MCV: 93.1 fl (ref 78.0–100.0)
MONOS PCT: 9.9 % (ref 3.0–12.0)
Monocytes Absolute: 0.7 10*3/uL (ref 0.1–1.0)
Neutro Abs: 4.1 10*3/uL (ref 1.4–7.7)
Neutrophils Relative %: 57.4 % (ref 43.0–77.0)
Platelets: 220 10*3/uL (ref 150.0–400.0)
RBC: 4.57 Mil/uL (ref 3.87–5.11)
RDW: 14.9 % (ref 11.5–15.5)
WBC: 7.2 10*3/uL (ref 4.0–10.5)

## 2018-01-16 LAB — POC URINALSYSI DIPSTICK (AUTOMATED)
Bilirubin, UA: NEGATIVE
Glucose, UA: NEGATIVE
Ketones, UA: NEGATIVE
NITRITE UA: NEGATIVE
PH UA: 6 (ref 5.0–8.0)
Protein, UA: NEGATIVE
RBC UA: NEGATIVE
SPEC GRAV UA: 1.015 (ref 1.010–1.025)
UROBILINOGEN UA: 0.2 U/dL

## 2018-01-16 LAB — POCT INR: INR: 4

## 2018-01-16 LAB — VITAMIN B12

## 2018-01-16 NOTE — Patient Instructions (Signed)
INR today is 4.0 *Just finished round of Keflex and may restart another abx for UTI  Hold coumadin today (5/1) and tomorrow (5/2) and then cut dose to (3mg  daily EXCEPT for 1.5mg  Mon, Wed and Fridays)  Will recheck in 2 weeks.  Daughter, Lugene notified of results and instructions for recheck.

## 2018-01-16 NOTE — Patient Instructions (Signed)
Urinalysis and culture   Labs   If all normal- ? Consider CT of head   Watch blood sugar and keep me posted

## 2018-01-16 NOTE — Progress Notes (Signed)
Subjective:    Patient ID: Bianca Mueller, female    DOB: 22-May-1926, 84 y.o.   MRN: [id number]  HPI Here for c/o mental status change   Wt Readings from Last 3 Encounters:  01/16/18 126 lb 4 oz (57.3 kg)  11/08/17 124 lb 8 oz (56.5 kg)  11/01/17 127 lb (57.6 kg)   23.85 kg/m Wt is stable  Appetite is fair - good days and bad days   BP Readings from Last 3 Encounters:  01/16/18 126/78  11/08/17 (!) 164/80  11/01/17 (!) 201/88     This started on 4/23 when she woke up from a nap Started to talk about things that did not make sense (no slurring however)  Seemed a bit sedated and confused They suspected she may have a uti and UA was done  It had large leukocytes in it (otherwise nl) - difficult to get a clean catch  tx with keflex  Urine culture showed likely contamination   She still c/o of some burning on urination and some frequency  Still trouble with getting words out (not slurring)  Also confused  Drinking water regularly     Did realize in the interim that her cholesterol medicines made her anxious and she stopped them   (cardiology aware)  Now she has improved but seems to have trouble thinking of words she wants to say and they do not always come out right "a little off" Most of the time lucid  Less talkative-perhaps due to above Concern re: possible mild cva   Last CT of head was 2/17 CT Head Wo Contrast (Accession [id number]) (Order [id number])  Imaging  Date: 10/31/2015 Department: [facility] UN[facility] Released By/Authorizing: Rolland Porter, MD (auto-released)  Exam Information   Status Exam Begun  Exam Ended   Final [99] 10/31/2015 2:31 AM 10/31/2015 2:44 AM  PACS Images   Show images for CT Head Wo Contrast  Study Result   CLINICAL DATA:  84 year old female with fall  EXAM: CT HEAD WITHOUT CONTRAST  CT MAXILLOFACIAL WITHOUT CONTRAST  TECHNIQUE: Multidetector CT imaging of the head and maxillofacial  structures were performed using the standard protocol without intravenous contrast. Multiplanar CT image reconstructions of the maxillofacial structures were also generated.  COMPARISON:  CT dated 02/31/2016  FINDINGS: CT HEAD FINDINGS  There is prominence of the ventricles and sulci compatible with age-related atrophy. Periventricular and deep white matter chronic microvascular ischemic changes noted. There is no acute intracranial hemorrhage. No mass effect or midline shift.  There is mucoperiosteal thickening and partial opacification of the visualized paranasal sinuses. The mastoid air cells are clear. The calvarium is intact.  CT MAXILLOFACIAL FINDINGS  There is no acute facial bone fractures. The maxilla, mandible, and pterygoid plates are intact. The globes, retro-orbital fat, and orbital walls are preserved. There is partial opacification of the maxillary sinuses bilaterally. The soft tissues are unremarkable.  IMPRESSION: No acute intracranial hemorrhage.  No acute/ traumatic ischial bone fractures.   Electronically Signed   By: Anner Crete M.D.   On: 10/31/2015 02:59   Also had normal event monitor 6/17 with cardiology  Last echocardiogram showed diastolic dysfunction 20/80  Cardiology visit was 12/18  Cannot tolerate any cholesterol medication  Hx of NSTEMI and HTN  Past a fib after cath   Takes pacerone  catapress cardura Lasix Hydralazine Lisinopril  Metoprolol Coumadin Nitro prn     No mention of carotid dz   DM2 Saw endo- can  no longer afford to go Lab Results  Component Value Date   HGBA1C 6.6 (H) 08/15/2017   Fructosamine 328-equiv to A1C of 8.8 On januvia and glipizide    Last labs Lab Results  Component Value Date   CREATININE 1.13 (H) 11/01/2017   BUN 26 (H) 11/01/2017   NA 139 11/01/2017   K 4.2 11/01/2017   CL 102 11/01/2017   CO2 21 (L) 11/01/2017   Lab Results  Component Value Date   ALT 48 (H)  10/24/2017   AST 52 (H) 10/24/2017   ALKPHOS 47 08/15/2017   BILITOT 0.5 08/15/2017   Lab Results  Component Value Date   WBC 6.8 11/01/2017   HGB 14.5 11/01/2017   HCT 43.9 11/01/2017   MCV 91.5 11/01/2017   PLT 231 11/01/2017    Lab Results  Component Value Date   VITAMINB12 >1500 (H) 08/01/2016   Lab Results  Component Value Date   TSH 4.48 08/15/2017    Vit D 35   Anxiety -says she felt anx last night Sleeping well last night  No falls or head injuries   Patient Active Problem List   Diagnosis Date Noted  . UTI (urinary tract infection) 01/16/2018  . Change in mental status 01/16/2018  . Polyuria 11/08/2017  . Medicare annual wellness visit, subsequent 10/24/2017  . Insomnia disorder 08/15/2017  . Long term (current) use of anticoagulants 06/26/2017  . At high risk for falls 11/16/2016  . Routine general medical examination at a health care facility 08/03/2016  . Anxiety 11/08/2015  . Gait disorder 09/21/2015  . History of fall 08/24/2015  . Hearing loss 08/24/2015  . Acute on chronic diastolic heart failure ([place]) 08/07/2014  . Encounter for therapeutic drug monitoring 08/06/2014  . Chronic diastolic CHF (congestive heart failure) ([place])   . CAD (coronary artery disease)   . Atrial fibrillation ([place])   . Transaminitis 07/20/2014  . Atrial flutter ([place]) 07/20/2014  . NSTEMI (non-ST elevated myocardial infarction) ([place]) 07/18/2014  . Encounter for Medicare annual wellness exam 08/19/2013  . Other screening mammogram 05/17/2012  . Adult failure to thrive 05/17/2011  . DM2 (diabetes mellitus, type 2) ([place]) 02/14/2011  . ANEMIA, VITAMIN B12 DEFICIENCY 09/26/2010  . PEDAL EDEMA 03/16/2010  . [place] ALLERGY 09/06/2009  . Vitamin D deficiency 03/02/2009  . DIVERTICULITIS, COLON 11/12/2008  . COLONIC POLYPS, HX OF 09/02/2008  . HYPERCHOLESTEROLEMIA 12/10/2006  . Essential hypertension 12/10/2006  . Osteoporosis 03/18/2002   Past Medical History:    Diagnosis Date  . Arthritis    "hips" (08/07/2014)  . CAD (coronary artery disease)    a. 1989 s/p MI, TPA, PTCA;  b. 06/2014 NSTEMI/Cath: LM nl, LAD 30-40, D2 90, small, LCX 121m OM1 90, RCA Cs2+ 70-80p, 50-675m90d (? ruptured plaque), EF 55-60%-->Med Rx.  . Chronic diastolic CHF (congestive heart failure) ([place]   a. 07/2014 Echo: EF 60-65%, Gr 1 DD, mild to mod TR, PASP 3711m.  . Diverticulosis   . Elevated LFTs    a. 06/2014 Abd U/S: no GB pathology.  . GMarland KitchenRD (gastroesophageal reflux disease)   . Hx of adenomatous colonic polyps 2010   last colonscopy 2010,Dr EllVira Agarno need for further  colonoscopy given age  . Hypercalcemia   . Hyperlipidemia   . Hypertension   . Myocardial infarction ([place]  . Osteoporosis   . PAF/PAFlutter    a. 06/2014->amio and coumadin started.  . Type II diabetes mellitus ([place] . Zoster  past   Past Surgical History:  Procedure Laterality Date  . CARDIAC CATHETERIZATION  07/22/2014  . CORONARY ANGIOPLASTY  1989   "@ [facility]"  . LEFT HEART CATHETERIZATION WITH CORONARY ANGIOGRAM N/A 07/22/2014   Procedure: LEFT HEART CATHETERIZATION WITH CORONARY ANGIOGRAM;  Surgeon: Sinclair Grooms, MD;  Location[facility] CATH LAB;  Service: Cardiovascular;  Laterality: N/A;  . SHOULDER OPEN ROTATOR CUFF REPAIR Right 1980's  . VAGINAL HYSTERECTOMY  1970's?   ovaries intact   Social History   Tobacco Use  . Smoking status: Former Smoker    Packs/day: 0.25    Years: 30.00    Pack years: 7.50    Types: Cigarettes  . Smokeless tobacco: Never Used  . Tobacco comment: "quit smoking in the 1980's"  Substance Use Topics  . Alcohol use: No    Alcohol/week: 0.0 oz  . Drug use: No   Family History  Problem Relation Age of Onset  . Heart disease Mother        CAD  . Heart disease Father        CAD  . Heart disease Brother        CAD   Allergies  Allergen Reactions  . Alendronate Sodium Other (See Comments)    unknown  . Doxycycline Other (See  Comments)    Inc LFT  . Isosorbide Nitrate Other (See Comments)  . Lidocaine Nausea Only    Given at Dentist office  . Metformin And Related     Decreases appetite resulting in weight loss  . Metronidazole Nausea And Vomiting    REACTION: sick/ GI  . Nifedipine Other (See Comments)   Current Outpatient Medications on File Prior to Visit  Medication Sig Dispense Refill  . ACCU-CHEK FASTCLIX LANCETS MISC Use as instructed to test blood sugar once daily or as needed.  Diagnosis:  E11.9   Non insulin-dependent. 102 each 4  . ALPRAZolam (XANAX) 0.25 MG tablet Take 0.25 mg by mouth as needed.    Marland Kitchen amiodarone (PACERONE) 200 MG tablet Take 200 mg by mouth daily.    . Blood Glucose Monitoring Suppl (ACCU-CHEK AVIVA PLUS) W/DEVICE KIT Use as instructed to test blood sugar once daily or as needed.  Diagnosis:  E11.9  Non insulin-dependent. 1 kit 0  . busPIRone (BUSPAR) 30 MG tablet Take 1 tablet (30 mg total) by mouth 2 (two) times daily. 180 tablet 1  . cholecalciferol (VITAMIN D) 1000 UNITS tablet Take 2,000 Units by mouth daily at 12 noon.     . clonazePAM (KLONOPIN) 0.5 MG tablet Take 1 tablet (0.5 mg total) by mouth at bedtime. 30 tablet 3  . cloNIDine (CATAPRES) 0.2 MG tablet TAKE 1 TABLET TWICE DAILY 180 tablet 3  . docusate sodium 100 MG CAPS Take 100 mg by mouth 2 (two) times daily. 10 capsule 0  . doxazosin (CARDURA) 4 MG tablet TAKE 1 TABLET TWICE DAILY 180 tablet 3  . furosemide (LASIX) 40 MG tablet TAKE 1 TABLET DAILY. AS DIRECTED BY CARDIOLOGIST,  MAY TAKE ADDITIONAL DOSES AS NEEDED 180 tablet 2  . glipiZIDE (GLUCOTROL XL) 2.5 MG 24 hr tablet TAKE 1 TABLET DAILY WITH LUNCH. 90 tablet 1  . glucose blood (ACCU-CHEK AVIVA PLUS) test strip USE AS INSTRUCTED TO CHECK BLOOD SUGAR ONCE DAILY OR AS NEEDED.  Dx code E11.59 100 each 3  . hydrALAZINE (APRESOLINE) 100 MG tablet TAKE 1 TABLET THREE TIMES DAILY 270 tablet 3  . levothyroxine (SYNTHROID, LEVOTHROID) 25 MCG tablet TAKE 1 TABLET  EVERY  DAY BEFORE BREAKFAST 90 tablet 3  . lisinopril (PRINIVIL,ZESTRIL) 20 MG tablet Take 1 tablet (20 mg total) by mouth daily. 90 tablet 3  . metoprolol tartrate (LOPRESSOR) 25 MG tablet Take 1 tablet (25 mg total) by mouth 2 (two) times daily. (Patient taking differently: Take 12.5 mg by mouth 2 (two) times daily. ) 180 tablet 3  . nitroGLYCERIN (NITROSTAT) 0.4 MG SL tablet Place 1 tablet (0.4 mg total) under the tongue every 5 (five) minutes as needed for chest pain. If not improved seek medical attention in ER 15 tablet 1  . omeprazole (PRILOSEC) 20 MG capsule Take 20 mg by mouth daily.    . sitaGLIPtin (JANUVIA) 100 MG tablet Take 1 tablet (100 mg total) by mouth daily. 30 tablet 0  . vitamin B-12 (CYANOCOBALAMIN) 1000 MCG tablet Take 1,000 mcg by mouth every other day.     . warfarin (COUMADIN) 3 MG tablet TAKE AS DIRECTED BY ANTI-COAGULATION CLINIC. 70 tablet 1   No current facility-administered medications on file prior to visit.     Review of Systems  Constitutional: Positive for fatigue. Negative for activity change, appetite change, fever and unexpected weight change.  HENT: Positive for mouth sores. Negative for congestion, ear pain, rhinorrhea, sinus pressure and sore throat.   Eyes: Negative for pain, redness and visual disturbance.  Respiratory: Negative for cough, shortness of breath and wheezing.   Cardiovascular: Negative for chest pain and palpitations.  Gastrointestinal: Negative for abdominal pain, blood in stool, constipation and diarrhea.  Endocrine: Negative for polydipsia and polyuria.  Genitourinary: Positive for frequency. Negative for dysuria, hematuria, pelvic pain and urgency.  Musculoskeletal: Positive for arthralgias. Negative for back pain and myalgias.  Skin: Negative for pallor and rash.  Allergic/Immunologic: Negative for environmental allergies.  Neurological: Negative for dizziness, tremors, syncope, facial asymmetry, light-headedness and headaches.        General unsteadiness   Hematological: Negative for adenopathy. Does not bruise/bleed easily.  Psychiatric/Behavioral: Negative for decreased concentration and dysphoric mood. The patient is not nervous/anxious.        Objective:   Physical Exam  Constitutional: She is oriented to person, place, and time. She appears well-developed and well-nourished. No distress.  Slim and well appearing elderly female (more frail than baseline however)   HENT:  Head: Normocephalic and atraumatic.  Right Ear: External ear normal.  Left Ear: External ear normal.  Nose: Nose normal.  Mouth/Throat: Oropharynx is clear and moist. No oropharyngeal exudate.  No sinus tenderness No temporal tenderness  No TMJ tenderness  Eyes: Pupils are equal, round, and reactive to light. Conjunctivae and EOM are normal. Right eye exhibits no discharge. Left eye exhibits no discharge. No scleral icterus.  No nystagmus  Neck: Normal range of motion and full passive range of motion without pain. Neck supple. No JVD present. Carotid bruit is not present. No tracheal deviation present. No thyromegaly present.  Cardiovascular: Regular rhythm, normal heart sounds and intact distal pulses. Exam reveals no gallop.  No murmur heard. Rate in 50s  Pulmonary/Chest: Effort normal and breath sounds normal. No respiratory distress. She has no wheezes. She has no rales.  No crackles  Good air exch  Abdominal: Soft. Bowel sounds are normal. She exhibits no distension, no abdominal bruit and no mass. There is no tenderness. There is no rebound and no guarding.  Musculoskeletal: She exhibits no edema or tenderness.  Lymphadenopathy:    She has no cervical adenopathy.  Neurological: She is alert and oriented to  person, place, and time. She has normal strength and normal reflexes. She displays no atrophy, no tremor and normal reflexes. No cranial nerve deficit or sensory deficit. She exhibits normal muscle tone. She displays a negative  Romberg sign. Coordination and gait normal.  No focal cerebellar signs  No facial droop   Speech is slow but normal today   Skin: Skin is warm and dry. No rash noted. No pallor.  Psychiatric: She has a normal mood and affect. Her behavior is normal. Thought content normal.  Pt seems mildly more confused than normal  Has to think about responses before answering  No slurring of speech  Appropriate   Much more quiet than usual   Helpful daughter is with her today          Assessment & Plan:   Problem List Items Addressed This Visit      Cardiovascular and Mediastinum   Essential hypertension    bp in fair control at this time  BP Readings from Last 1 Encounters:  01/16/18 126/78   No changes needed Disc lifstyle change with low sodium diet and exercise        Relevant Orders   CBC with Differential/Platelet (Completed)   Comprehensive metabolic panel (Completed)   TSH (Completed)     Endocrine   DM2 (diabetes mellitus, type 2) ([place])    Lab Results  Component Value Date   HGBA1C 6.6 (H) 08/15/2017   No reports of hypoglycemia  Per fructosamine test adjusted A1C is higher --8.8  Will start checking some blood glucose levels Unfortunately was not able to afford return to her endocrinologist         Genitourinary   UTI (urinary tract infection)    Last ucx was contaminated  Re culture urine  Was tx with keflex  Unsure if this is the cause of MS change       Relevant Orders   POCT Urinalysis Dipstick (Automated) (Completed)   Urine Culture     Other   ANEMIA, VITAMIN B12 DEFICIENCY    B12 level today in setting of her MS change      Relevant Orders   Vitamin B12 (Completed)   Anxiety    Some improvement after stopping statin chol med      Change in mental status - Primary    With word finding and general slowed cognition but no focal neuro changes  Checking another urine cx Cannot r/o mild CVA Pt /family considering CT for diagnosis if needed    Continues on warfarin  Unable to tolerate statins  bp is stable       Relevant Orders   POCT Urinalysis Dipstick (Automated) (Completed)   Urine Culture   HYPERCHOLESTEROLEMIA    Now off all medication due to intolerance Caused anxiety symptoms

## 2018-01-17 ENCOUNTER — Encounter: Payer: Self-pay | Admitting: Family Medicine

## 2018-01-17 ENCOUNTER — Ambulatory Visit: Payer: Medicare HMO

## 2018-01-17 LAB — URINE CULTURE
MICRO NUMBER:: 90531637
RESULT: NO GROWTH
SPECIMEN QUALITY: ADEQUATE

## 2018-01-17 NOTE — Assessment & Plan Note (Signed)
With word finding and general slowed cognition but no focal neuro changes  Checking another urine cx Cannot r/o mild CVA Pt /family considering CT for diagnosis if needed  Continues on warfarin  Unable to tolerate statins  bp is stable

## 2018-01-17 NOTE — Assessment & Plan Note (Signed)
bp in fair control at this time  BP Readings from Last 1 Encounters:  01/16/18 126/78   No changes needed Disc lifstyle change with low sodium diet and exercise

## 2018-01-17 NOTE — Assessment & Plan Note (Signed)
Now off all medication due to intolerance Caused anxiety symptoms

## 2018-01-17 NOTE — Assessment & Plan Note (Signed)
B12 level today in setting of her MS change

## 2018-01-17 NOTE — Assessment & Plan Note (Signed)
Last ucx was contaminated  Re culture urine  Was tx with keflex  Unsure if this is the cause of MS change

## 2018-01-17 NOTE — Assessment & Plan Note (Signed)
Some improvement after stopping statin chol med

## 2018-01-17 NOTE — Assessment & Plan Note (Signed)
Lab Results  Component Value Date   HGBA1C 6.6 (H) 08/15/2017   No reports of hypoglycemia  Per fructosamine test adjusted A1C is higher --8.8  Will start checking some blood glucose levels Unfortunately was not able to afford return to her endocrinologist

## 2018-01-21 ENCOUNTER — Encounter: Payer: Self-pay | Admitting: Family Medicine

## 2018-01-24 ENCOUNTER — Encounter: Payer: Self-pay | Admitting: Family Medicine

## 2018-01-24 ENCOUNTER — Other Ambulatory Visit: Payer: Self-pay | Admitting: Family Medicine

## 2018-01-30 ENCOUNTER — Ambulatory Visit (INDEPENDENT_AMBULATORY_CARE_PROVIDER_SITE_OTHER): Payer: Medicare HMO

## 2018-01-30 DIAGNOSIS — I4891 Unspecified atrial fibrillation: Secondary | ICD-10-CM

## 2018-01-30 DIAGNOSIS — Z7901 Long term (current) use of anticoagulants: Secondary | ICD-10-CM | POA: Diagnosis not present

## 2018-01-30 DIAGNOSIS — I4892 Unspecified atrial flutter: Secondary | ICD-10-CM

## 2018-01-30 DIAGNOSIS — H2513 Age-related nuclear cataract, bilateral: Secondary | ICD-10-CM | POA: Diagnosis not present

## 2018-01-30 DIAGNOSIS — H2511 Age-related nuclear cataract, right eye: Secondary | ICD-10-CM | POA: Diagnosis not present

## 2018-01-30 LAB — POCT INR: INR: 2.2

## 2018-01-30 LAB — HM DIABETES EYE EXAM

## 2018-01-30 NOTE — Patient Instructions (Addendum)
INR today is 2.2  Continue taking (3mg  daily EXCEPT for 1.5mg  Mon, Wed and Fridays)  Will recheck in 4 weeks.  Daughter, Lugene notified of results and instructions for recheck.    Patient did not go on repeat abx since last visit and has had no other changes in diet, health or medications since last INR check.

## 2018-01-31 ENCOUNTER — Ambulatory Visit: Payer: Medicare HMO

## 2018-02-07 ENCOUNTER — Encounter: Payer: Self-pay | Admitting: Family Medicine

## 2018-02-19 ENCOUNTER — Other Ambulatory Visit: Payer: Self-pay | Admitting: Family Medicine

## 2018-02-19 NOTE — Telephone Encounter (Signed)
I wrote it to call in - tried twice to do it electronically and it would not go through Sorry about that!

## 2018-02-19 NOTE — Telephone Encounter (Signed)
Spoke with Bianca Mueller and she said pt picked last refill today and this is just a automatic refill request they are asking you go ahead and fill Rx so when she is due in July they will have your new Rx already on file

## 2018-02-19 NOTE — Telephone Encounter (Signed)
Bianca Mueller is looking into if this was an auto refill request of if pt really needs med, she should have enough until July, will hold for now

## 2018-02-27 DIAGNOSIS — E113211 Type 2 diabetes mellitus with mild nonproliferative diabetic retinopathy with macular edema, right eye: Secondary | ICD-10-CM | POA: Diagnosis not present

## 2018-02-28 ENCOUNTER — Telehealth (INDEPENDENT_AMBULATORY_CARE_PROVIDER_SITE_OTHER): Payer: Medicare HMO | Admitting: *Deleted

## 2018-02-28 ENCOUNTER — Other Ambulatory Visit (INDEPENDENT_AMBULATORY_CARE_PROVIDER_SITE_OTHER): Payer: Medicare HMO

## 2018-02-28 ENCOUNTER — Ambulatory Visit (INDEPENDENT_AMBULATORY_CARE_PROVIDER_SITE_OTHER): Payer: Medicare HMO | Admitting: General Practice

## 2018-02-28 DIAGNOSIS — E1159 Type 2 diabetes mellitus with other circulatory complications: Secondary | ICD-10-CM

## 2018-02-28 DIAGNOSIS — E039 Hypothyroidism, unspecified: Secondary | ICD-10-CM | POA: Diagnosis not present

## 2018-02-28 DIAGNOSIS — I4892 Unspecified atrial flutter: Secondary | ICD-10-CM

## 2018-02-28 DIAGNOSIS — Z7901 Long term (current) use of anticoagulants: Secondary | ICD-10-CM | POA: Diagnosis not present

## 2018-02-28 DIAGNOSIS — I10 Essential (primary) hypertension: Secondary | ICD-10-CM | POA: Diagnosis not present

## 2018-02-28 DIAGNOSIS — I4891 Unspecified atrial fibrillation: Secondary | ICD-10-CM

## 2018-02-28 LAB — COMPREHENSIVE METABOLIC PANEL
ALT: 41 U/L — ABNORMAL HIGH (ref 0–35)
AST: 40 U/L — ABNORMAL HIGH (ref 0–37)
Albumin: 3.9 g/dL (ref 3.5–5.2)
Alkaline Phosphatase: 51 U/L (ref 39–117)
BUN: 26 mg/dL — AB (ref 6–23)
CHLORIDE: 105 meq/L (ref 96–112)
CO2: 31 mEq/L (ref 19–32)
Calcium: 9.6 mg/dL (ref 8.4–10.5)
Creatinine, Ser: 1.3 mg/dL — ABNORMAL HIGH (ref 0.40–1.20)
GFR: 40.94 mL/min — ABNORMAL LOW (ref >60.00)
GLUCOSE: 116 mg/dL — AB (ref 70–99)
POTASSIUM: 4.6 meq/L (ref 3.5–5.1)
SODIUM: 143 meq/L (ref 135–145)
Total Bilirubin: 0.4 mg/dL (ref 0.2–1.2)
Total Protein: 7.2 g/dL (ref 6.0–8.3)

## 2018-02-28 LAB — TSH: TSH: 5.56 u[IU]/mL — ABNORMAL HIGH (ref 0.35–4.50)

## 2018-02-28 LAB — HEMOGLOBIN A1C: Hgb A1c MFr Bld: 6.7 % — ABNORMAL HIGH (ref 4.6–6.5)

## 2018-02-28 LAB — POCT INR: INR: 3.5 — AB (ref 2.0–3.0)

## 2018-02-28 NOTE — Patient Instructions (Addendum)
Pre visit review using our clinic review tool, if applicable. No additional management support is needed unless otherwise documented below in the visit note.  Hold coumadin today and then continue to take 1 tablet (3mg  daily EXCEPT for 1.5mg  Mon, Wed and Fridays)  Will recheck in 4 weeks.  Daughter, Lugene notified of results and instructions for recheck.

## 2018-02-28 NOTE — Telephone Encounter (Signed)
See Lugene's message regarding pt's labs

## 2018-02-28 NOTE — Telephone Encounter (Signed)
-----   Message from Josetta Huddle, [place] sent at 02/28/2018  8:41 AM EDT ----- Regarding: Labs for my Mom Mom is coming in today to get her Coumadin checked at 2 pm.  She will be coming in to see Dr. Glori Bickers next Wednesday for her 6 month follow up visit.  She is to get labs prior.  Can you tell if the lab orders are in and if not, can you get her to put them in so that she could get labs while she's here today?

## 2018-02-28 NOTE — Telephone Encounter (Signed)
I ordered labs- A1c and cmet and tsh

## 2018-02-28 NOTE — Telephone Encounter (Signed)
Lugene notified

## 2018-03-06 ENCOUNTER — Ambulatory Visit (INDEPENDENT_AMBULATORY_CARE_PROVIDER_SITE_OTHER): Payer: Medicare HMO | Admitting: Family Medicine

## 2018-03-06 ENCOUNTER — Encounter: Payer: Self-pay | Admitting: Family Medicine

## 2018-03-06 VITALS — BP 124/72 | HR 62 | Temp 98.3°F | Ht 61.0 in | Wt 126.5 lb

## 2018-03-06 DIAGNOSIS — E1159 Type 2 diabetes mellitus with other circulatory complications: Secondary | ICD-10-CM

## 2018-03-06 DIAGNOSIS — I10 Essential (primary) hypertension: Secondary | ICD-10-CM | POA: Diagnosis not present

## 2018-03-06 DIAGNOSIS — E039 Hypothyroidism, unspecified: Secondary | ICD-10-CM | POA: Diagnosis not present

## 2018-03-06 DIAGNOSIS — F419 Anxiety disorder, unspecified: Secondary | ICD-10-CM | POA: Diagnosis not present

## 2018-03-06 DIAGNOSIS — R7989 Other specified abnormal findings of blood chemistry: Secondary | ICD-10-CM

## 2018-03-06 DIAGNOSIS — R410 Disorientation, unspecified: Secondary | ICD-10-CM | POA: Diagnosis not present

## 2018-03-06 NOTE — Patient Instructions (Addendum)
Aim for more fluid intake (not sugar fluid)- mostly water if possible  This may be reason for urinary issues/discomfort and also increased cr   Keep me posted re: mental status change/etc Overall things look stable  Try to keep routine for meals/etc and watch for low blood sugar   Let's plan a wellness visit in feb

## 2018-03-06 NOTE — Progress Notes (Signed)
Subjective:    Patient ID: Bianca Mueller, female    DOB: 10-17-1925, 84 y.o.   MRN: [id number]  HPI Here for f/u of chronic medical problems  Doing so/so  Not feeling great yesterday or today    Not drinking fluids as she should  Only likes sweet   Wt Readings from Last 3 Encounters:  03/06/18 126 lb 8 oz (57.4 kg)  01/16/18 126 lb 4 oz (57.3 kg)  11/08/17 124 lb 8 oz (56.5 kg)  stable weight  Eating ok/good appetite  23.90 kg/m   Last visit was here for MS change (overall some better but it comes and goes)  No particular times  tx with keflex for suspected uti No growth on cx     bp is stable today  No cp or palpitations or headaches or edema  No side effects to medicines  BP Readings from Last 3 Encounters:  03/06/18 124/72  01/16/18 126/78  11/08/17 (!) 164/80     Pulse Readings from Last 3 Encounters:  03/06/18 62  01/16/18 (!) 55  11/08/17 68     DM2 Lab Results  Component Value Date   HGBA1C 6.7 (H) 02/28/2018   has had elevated fluctosamine in the past  On januvia and glipizide Denies any low sugar episodes  No changes  Stable   Anxiety -has not worsened  Not more nervous than usual  No change in stress level    Lab Results  Component Value Date   VITAMINB12 >1500 (H) 01/16/2018    Lab Results  Component Value Date   TSH 5.56 (H) 02/28/2018   levothyroxine 25 mcg- missed some doses  Is back on track    Lab Results  Component Value Date   CREATININE 1.30 (H) 02/28/2018   BUN 26 (H) 02/28/2018   NA 143 02/28/2018   K 4.6 02/28/2018   CL 105 02/28/2018   CO2 31 02/28/2018   Cr is up   Lab Results  Component Value Date   ALT 41 (H) 02/28/2018   AST 40 (H) 02/28/2018   ALKPHOS 51 02/28/2018   BILITOT 0.4 02/28/2018   Improved   Patient Active Problem List   Diagnosis Date Noted  . Hypothyroid 02/28/2018  . Change in mental status 01/16/2018  . Polyuria 11/08/2017  . Medicare annual wellness visit, subsequent  10/24/2017  . Insomnia disorder 08/15/2017  . Long term (current) use of anticoagulants 06/26/2017  . At high risk for falls 11/16/2016  . Routine general medical examination at a health care facility 08/03/2016  . Anxiety 11/08/2015  . Gait disorder 09/21/2015  . History of fall 08/24/2015  . Hearing loss 08/24/2015  . Acute on chronic diastolic heart failure ([place]) 08/07/2014  . Encounter for therapeutic drug monitoring 08/06/2014  . Chronic diastolic CHF (congestive heart failure) ([place])   . CAD (coronary artery disease)   . Atrial fibrillation ([place])   . Transaminitis 07/20/2014  . Atrial flutter ([place]) 07/20/2014  . NSTEMI (non-ST elevated myocardial infarction) ([place]) 07/18/2014  . Encounter for Medicare annual wellness exam 08/19/2013  . Other screening mammogram 05/17/2012  . Adult failure to thrive 05/17/2011  . DM2 (diabetes mellitus, type 2) ([place]) 02/14/2011  . ANEMIA, VITAMIN B12 DEFICIENCY 09/26/2010  . PEDAL EDEMA 03/16/2010  . [place] ALLERGY 09/06/2009  . Vitamin D deficiency 03/02/2009  . DIVERTICULITIS, COLON 11/12/2008  . COLONIC POLYPS, HX OF 09/02/2008  . HYPERCHOLESTEROLEMIA 12/10/2006  . Essential hypertension 12/10/2006  . Osteoporosis 03/18/2002   Past  Medical History:  Diagnosis Date  . Arthritis    "hips" (08/07/2014)  . CAD (coronary artery disease)    a. 1989 s/p MI, TPA, PTCA;  b. 06/2014 NSTEMI/Cath: LM nl, LAD 30-40, D2 90, small, LCX 147m OM1 90, RCA Cs2+ 70-80p, 50-674m90d (? ruptured plaque), EF 55-60%-->Med Rx.  . Chronic diastolic CHF (congestive heart failure) ([facility]   a. 07/2014 Echo: EF 60-65%, Gr 1 DD, mild to mod TR, PASP 3716m.  . Diverticulosis   . Elevated LFTs    a. 06/2014 Abd U/S: no GB pathology.  . GMarland KitchenRD (gastroesophageal reflux disease)   . Hx of adenomatous colonic polyps 2010   last colonscopy 2010,Dr EllVira Agarno need for further  colonoscopy given age  . Hypercalcemia   . Hyperlipidemia   . Hypertension   .  Myocardial infarction ([facility]  . Osteoporosis   . PAF/PAFlutter    a. 06/2014->amio and coumadin started.  . Type II diabetes mellitus ([facility] . Zoster    past   Past Surgical History:  Procedure Laterality Date  . CARDIAC CATHETERIZATION  07/22/2014  . CORONARY ANGIOPLASTY  1989   "@ [facility]"  . LEFT HEART CATHETERIZATION WITH CORONARY ANGIOGRAM N/A 07/22/2014   Procedure: LEFT HEART CATHETERIZATION WITH CORONARY ANGIOGRAM;  Surgeon: HenSinclair GroomsD;  [facility];  Service: Cardiovascular;  Laterality: N/A;  . SHOULDER OPEN ROTATOR CUFF REPAIR Right 1980's  . VAGINAL HYSTERECTOMY  1970's?   ovaries intact   Social History   Tobacco Use  . Smoking status: Former Smoker    Packs/day: 0.25    Years: 30.00    Pack years: 7.50    Types: Cigarettes  . Smokeless tobacco: Never Used  . Tobacco comment: "quit smoking in the 1980's"  Substance Use Topics  . Alcohol use: No    Alcohol/week: 0.0 oz  . Drug use: No   Family History  Problem Relation Age of Onset  . Heart disease Mother        CAD  . Heart disease Father        CAD  . Heart disease Brother        CAD   Allergies  Allergen Reactions  . Alendronate Sodium Other (See Comments)    unknown  . Doxycycline Other (See Comments)    Inc LFT  . Isosorbide Nitrate Other (See Comments)  . Lidocaine Nausea Only    Given at Dentist office  . Metformin And Related     Decreases appetite resulting in weight loss  . Metronidazole Nausea And Vomiting    REACTION: sick/ GI  . Nifedipine Other (See Comments)   Current Outpatient Medications on File Prior to Visit  Medication Sig Dispense Refill  . ACCU-CHEK FASTCLIX LANCETS MISC Use as instructed to test blood sugar once daily or as needed.  Diagnosis:  E11.9   Non insulin-dependent. 102 each 4  . ALPRAZolam (XANAX) 0.25 MG tablet Take 0.25 mg by mouth as needed.    . aMarland Kitcheniodarone (PACERONE) 200 MG tablet Take 200 mg by mouth daily.    . Blood Glucose  Monitoring Suppl (ACCU-CHEK AVIVA PLUS) W/DEVICE KIT Use as instructed to test blood sugar once daily or as needed.  Diagnosis:  E11.9  Non insulin-dependent. 1 kit 0  . busPIRone (BUSPAR) 30 MG tablet Take 1 tablet (30 mg total) by mouth 2 (two) times daily. 180 tablet 1  . cholecalciferol (VITAMIN D) 1000 UNITS tablet Take 2,000 Units by  mouth daily at 12 noon.     . clonazePAM (KLONOPIN) 0.5 MG tablet TAKE ONE TABLET BY MOUTH AT BEDTIME. 30 tablet 3  . cloNIDine (CATAPRES) 0.2 MG tablet TAKE 1 TABLET TWICE DAILY 180 tablet 3  . docusate sodium 100 MG CAPS Take 100 mg by mouth 2 (two) times daily. 10 capsule 0  . doxazosin (CARDURA) 4 MG tablet TAKE 1 TABLET TWICE DAILY (Patient taking differently: TAKE 1 TABLET  DAILY) 180 tablet 3  . furosemide (LASIX) 40 MG tablet TAKE 1 TABLET DAILY. AS DIRECTED BY CARDIOLOGIST,  MAY TAKE ADDITIONAL DOSES AS NEEDED 180 tablet 2  . glipiZIDE (GLUCOTROL XL) 2.5 MG 24 hr tablet TAKE 1 TABLET DAILY WITH LUNCH. 90 tablet 1  . glucose blood (ACCU-CHEK AVIVA PLUS) test strip USE AS INSTRUCTED TO CHECK BLOOD SUGAR ONCE DAILY OR AS NEEDED.  Dx code E11.59 100 each 3  . hydrALAZINE (APRESOLINE) 100 MG tablet TAKE 1 TABLET THREE TIMES DAILY 270 tablet 3  . levothyroxine (SYNTHROID, LEVOTHROID) 25 MCG tablet TAKE 1 TABLET EVERY DAY BEFORE BREAKFAST 90 tablet 3  . lisinopril (PRINIVIL,ZESTRIL) 20 MG tablet Take 1 tablet (20 mg total) by mouth daily. 90 tablet 3  . metoprolol tartrate (LOPRESSOR) 25 MG tablet Take 1 tablet (25 mg total) by mouth 2 (two) times daily. (Patient taking differently: Take 12.5 mg by mouth 2 (two) times daily. ) 180 tablet 3  . nitroGLYCERIN (NITROSTAT) 0.4 MG SL tablet Place 1 tablet (0.4 mg total) under the tongue every 5 (five) minutesas needed for chest pain. If not improved seek medical attention in ER 25 tablet 1  . omeprazole (PRILOSEC) 20 MG capsule Take 20 mg by mouth daily.    . sitaGLIPtin (JANUVIA) 100 MG tablet Take 1 tablet (100 mg  total) by mouth daily. 30 tablet 0  . vitamin B-12 (CYANOCOBALAMIN) 1000 MCG tablet Take 1,000 mcg by mouth every other day.     . warfarin (COUMADIN) 3 MG tablet TAKE AS DIRECTED BY ANTI-COAGULATION CLINIC. 70 tablet 1   No current facility-administered medications on file prior to visit.     Review of Systems  Constitutional: Positive for fatigue. Negative for activity change, appetite change, fever and unexpected weight change.  HENT: Negative for congestion, ear pain, rhinorrhea, sinus pressure and sore throat.   Eyes: Negative for pain, redness and visual disturbance.  Respiratory: Negative for cough, shortness of breath and wheezing.   Cardiovascular: Negative for chest pain and palpitations.  Gastrointestinal: Negative for abdominal pain, blood in stool, constipation and diarrhea.  Endocrine: Negative for polydipsia and polyuria.  Genitourinary: Negative for dysuria, frequency and urgency.  Musculoskeletal: Negative for arthralgias, back pain and myalgias.  Skin: Negative for pallor and rash.  Allergic/Immunologic: Negative for environmental allergies.  Neurological: Negative for dizziness, syncope and headaches.  Hematological: Negative for adenopathy. Does not bruise/bleed easily.  Psychiatric/Behavioral: Positive for confusion. Negative for decreased concentration and dysphoric mood. The patient is not nervous/anxious.        Dec motivation at times  Dec short term memory       Objective:   Physical Exam  Constitutional: She appears well-developed and well-nourished. No distress.  HENT:  Head: Normocephalic and atraumatic.  Mouth/Throat: Oropharynx is clear and moist.  Eyes: Pupils are equal, round, and reactive to light. Conjunctivae and EOM are normal.  Neck: Normal range of motion. Neck supple. No JVD present. Carotid bruit is not present. No thyromegaly present.  Cardiovascular: Normal rate, normal heart sounds and  intact distal pulses. Exam reveals no gallop.    Pulmonary/Chest: Effort normal and breath sounds normal. No respiratory distress. She has no wheezes. She has no rales.  No crackles  Good air exch  Abdominal: Soft. Bowel sounds are normal. She exhibits no distension, no abdominal bruit and no mass. There is no tenderness.  Musculoskeletal: She exhibits no edema, tenderness or deformity.  Mild kyphosis   Lymphadenopathy:    She has no cervical adenopathy.  Neurological: She is alert. She has normal reflexes. She displays normal reflexes. No cranial nerve deficit. Coordination normal.  Skin: Skin is warm and dry. No rash noted. No erythema. No pallor.  sks diff  Psychiatric: She has a normal mood and affect.  Quiet and pleasant  Answers questions appropriately          Assessment & Plan:   Problem List Items Addressed This Visit      Cardiovascular and Mediastinum   Essential hypertension    bp in fair control at this time  BP Readings from Last 1 Encounters:  03/06/18 124/72   No changes needed Most recent labs reviewed  Disc lifstyle change with low sodium diet and exercise as tolerated          Endocrine   DM2 (diabetes mellitus, type 2) ([place]) - Primary    Lab Results  Component Value Date   HGBA1C 6.7 (H) 02/28/2018   No hypoglycemia  No change in tx Disc foot and eye care      Hypothyroid    Hypothyroidism  Pt has no clinical changes No change in energy level/ hair or skin/ edema and no tremor Lab Results  Component Value Date   TSH 5.56 (H) 02/28/2018     Missed doses- suspect that is cause of inc tsh  Will not change levothy dose at this time         Other   Anxiety    Stable currently  No change in tx       Change in mental status    Improved now       Elevated serum creatinine    Suspect mild chronic dehydration  Enc more water intake -will work on this  May also help energy level/malaise

## 2018-03-08 DIAGNOSIS — R7989 Other specified abnormal findings of blood chemistry: Secondary | ICD-10-CM | POA: Insufficient documentation

## 2018-03-08 NOTE — Assessment & Plan Note (Signed)
Suspect mild chronic dehydration  Enc more water intake -will work on this  May also help energy level/malaise

## 2018-03-08 NOTE — Assessment & Plan Note (Signed)
Lab Results  Component Value Date   HGBA1C 6.7 (H) 02/28/2018   No hypoglycemia  No change in tx Disc foot and eye care

## 2018-03-08 NOTE — Assessment & Plan Note (Signed)
Stable currently  No change in tx

## 2018-03-08 NOTE — Assessment & Plan Note (Signed)
Hypothyroidism  Pt has no clinical changes No change in energy level/ hair or skin/ edema and no tremor Lab Results  Component Value Date   TSH 5.56 (H) 02/28/2018     Missed doses- suspect that is cause of inc tsh  Will not change levothy dose at this time

## 2018-03-08 NOTE — Assessment & Plan Note (Signed)
Improved now.   

## 2018-03-08 NOTE — Assessment & Plan Note (Signed)
bp in fair control at this time  BP Readings from Last 1 Encounters:  03/06/18 124/72   No changes needed Most recent labs reviewed  Disc lifstyle change with low sodium diet and exercise as tolerated

## 2018-03-13 ENCOUNTER — Other Ambulatory Visit: Payer: Self-pay

## 2018-03-13 ENCOUNTER — Encounter: Payer: Self-pay | Admitting: *Deleted

## 2018-03-13 ENCOUNTER — Telehealth: Payer: Self-pay

## 2018-03-13 DIAGNOSIS — H2511 Age-related nuclear cataract, right eye: Secondary | ICD-10-CM | POA: Diagnosis not present

## 2018-03-13 NOTE — Telephone Encounter (Signed)
Cindy with [facility] called to check if the pt.'s last INR was addressed.INR was 3.5 and Warfarin was adjusted. Jenny Reichmann will relate this information to the eye doctor.

## 2018-03-15 NOTE — Discharge Instructions (Signed)

## 2018-03-18 ENCOUNTER — Ambulatory Visit: Payer: Medicare HMO | Admitting: Anesthesiology

## 2018-03-18 ENCOUNTER — Ambulatory Visit
Admission: RE | Admit: 2018-03-18 | Discharge: 2018-03-18 | Disposition: A | Discharge status: Home / Self Care | Payer: Medicare HMO | Source: Ambulatory Visit | Attending: Ophthalmology | Admitting: Ophthalmology

## 2018-03-18 ENCOUNTER — Encounter
Admission: RE | Disposition: A | Discharge status: Home / Self Care | Payer: Self-pay | Source: Ambulatory Visit | Attending: Ophthalmology

## 2018-03-18 DIAGNOSIS — H2511 Age-related nuclear cataract, right eye: Secondary | ICD-10-CM | POA: Diagnosis not present

## 2018-03-18 DIAGNOSIS — E1136 Type 2 diabetes mellitus with diabetic cataract: Secondary | ICD-10-CM | POA: Insufficient documentation

## 2018-03-18 DIAGNOSIS — H25811 Combined forms of age-related cataract, right eye: Secondary | ICD-10-CM | POA: Diagnosis not present

## 2018-03-18 DIAGNOSIS — I2510 Atherosclerotic heart disease of native coronary artery without angina pectoris: Secondary | ICD-10-CM | POA: Diagnosis not present

## 2018-03-18 DIAGNOSIS — Z87891 Personal history of nicotine dependence: Secondary | ICD-10-CM | POA: Diagnosis not present

## 2018-03-18 DIAGNOSIS — I252 Old myocardial infarction: Secondary | ICD-10-CM | POA: Diagnosis not present

## 2018-03-18 DIAGNOSIS — Z9861 Coronary angioplasty status: Secondary | ICD-10-CM | POA: Insufficient documentation

## 2018-03-18 DIAGNOSIS — I10 Essential (primary) hypertension: Secondary | ICD-10-CM | POA: Insufficient documentation

## 2018-03-18 HISTORY — DX: Presence of dental prosthetic device (complete) (partial): Z97.2

## 2018-03-18 HISTORY — PX: CATARACT EXTRACTION W/PHACO: SHX586

## 2018-03-18 LAB — GLUCOSE, CAPILLARY
GLUCOSE-CAPILLARY: 139 mg/dL — AB (ref 70–99)
Glucose-Capillary: 177 mg/dL — ABNORMAL HIGH (ref 70–99)

## 2018-03-18 SURGERY — PHACOEMULSIFICATION, CATARACT, WITH IOL INSERTION
Anesthesia: Monitor Anesthesia Care | Site: Eye | Laterality: Right | Wound class: "Clean "

## 2018-03-18 MED ORDER — FENTANYL CITRATE (PF) 100 MCG/2ML IJ SOLN
INTRAMUSCULAR | Status: DC | PRN
  Administered 2018-03-18: 50 ug via INTRAVENOUS

## 2018-03-18 MED ORDER — ERYTHROMYCIN 5 MG/GM OP OINT
TOPICAL_OINTMENT | OPHTHALMIC | Status: DC | PRN
  Administered 2018-03-18: 1 via OPHTHALMIC

## 2018-03-18 MED ORDER — SODIUM HYALURONATE 23 MG/ML IO SOLN
INTRAOCULAR | Status: DC | PRN
  Administered 2018-03-18: 0.6 mL via INTRAOCULAR

## 2018-03-18 MED ORDER — BRIMONIDINE TARTRATE-TIMOLOL 0.2-0.5 % OP SOLN
OPHTHALMIC | Status: DC | PRN
  Administered 2018-03-18: 1 [drp] via OPHTHALMIC

## 2018-03-18 MED ORDER — ACETYLCHOLINE CHLORIDE 20 MG IO SOLR
INTRAOCULAR | Status: DC | PRN
  Administered 2018-03-18: .5 mg via INTRAOCULAR

## 2018-03-18 MED ORDER — ARMC OPHTHALMIC DILATING DROPS
1.0000 "application " | OPHTHALMIC | Status: DC | PRN
  Administered 2018-03-18 (×3): 1 via OPHTHALMIC

## 2018-03-18 MED ORDER — MOXIFLOXACIN HCL 0.5 % OP SOLN
OPHTHALMIC | Status: DC | PRN
  Administered 2018-03-18: 0.2 mL via OPHTHALMIC

## 2018-03-18 MED ORDER — MIDAZOLAM HCL 2 MG/2ML IJ SOLN
INTRAMUSCULAR | Status: DC | PRN
  Administered 2018-03-18: 0.5 mg via INTRAVENOUS

## 2018-03-18 MED ORDER — LACTATED RINGERS IV SOLN: INTRAVENOUS | Status: DC

## 2018-03-18 MED ORDER — EPINEPHRINE PF 1 MG/ML IJ SOLN
INTRAOCULAR | Status: DC | PRN
  Administered 2018-03-18: 114 mL via OPHTHALMIC

## 2018-03-18 MED ORDER — SODIUM HYALURONATE 10 MG/ML IO SOLN
INTRAOCULAR | Status: DC | PRN
  Administered 2018-03-18: 0.55 mL via INTRAOCULAR

## 2018-03-18 MED ORDER — LIDOCAINE HCL (PF) 2 % IJ SOLN
INTRAOCULAR | Status: DC | PRN
  Administered 2018-03-18: 1 mL via INTRAOCULAR

## 2018-03-18 MED ORDER — OXYCODONE HCL 5 MG PO TABS: 5.0000 mg | ORAL_TABLET | Freq: Once | ORAL | Status: DC | PRN

## 2018-03-18 MED ORDER — TRIAMCINOLONE ACETONIDE 40 MG/ML IO SUSP
INTRAOCULAR | Status: DC | PRN
  Administered 2018-03-18: 1 mg via INTRAVITREAL

## 2018-03-18 MED ORDER — NA HYALUR & NA CHOND-NA HYALUR 0.55-0.5 ML IO KIT
PACK | INTRAOCULAR | Status: DC | PRN
  Administered 2018-03-18: 1 via OPHTHALMIC

## 2018-03-18 MED ORDER — OXYCODONE HCL 5 MG/5ML PO SOLN: 5.0000 mg | Freq: Once | ORAL | Status: DC | PRN

## 2018-03-18 SURGICAL SUPPLY — 19 items
CANNULA ANT/CHMB 27G (MISCELLANEOUS) ×1 IMPLANT
CANNULA ANT/CHMB 27GA (MISCELLANEOUS) ×6 IMPLANT
CORD BIP STRL DISP 12FT (MISCELLANEOUS) ×2 IMPLANT
DISSECTOR HYDRO NUCLEUS 50X22 (MISCELLANEOUS) ×3 IMPLANT
GLOVE BIO SURGEON STRL SZ8 (GLOVE) ×3 IMPLANT
GLOVE SURG LX 7.5 STRW (GLOVE) ×2
GLOVE SURG LX STRL 7.5 STRW (GLOVE) ×1 IMPLANT
GOWN STRL REUS W/ TWL LRG LVL3 (GOWN DISPOSABLE) ×2 IMPLANT
GOWN STRL REUS W/TWL LRG LVL3 (GOWN DISPOSABLE) ×4
LENS IOL TECNIS ITEC 23.0 (Intraocular Lens) ×2 IMPLANT
MARKER SKIN DUAL TIP RULER LAB (MISCELLANEOUS) ×3 IMPLANT
PACK CATARACT (MISCELLANEOUS) ×3 IMPLANT
PACK DR. KING ARMS (PACKS) ×3 IMPLANT
PACK EYE AFTER SURG (MISCELLANEOUS) ×3 IMPLANT
PACK VITRECTOMY ANT (MISCELLANEOUS) ×2 IMPLANT
SYR 3ML LL SCALE MARK (SYRINGE) ×3 IMPLANT
SYR TB 1ML LUER SLIP (SYRINGE) ×3 IMPLANT
WATER STERILE IRR 500ML POUR (IV SOLUTION) ×3 IMPLANT
WIPE NON LINTING 3.25X3.25 (MISCELLANEOUS) ×3 IMPLANT

## 2018-03-18 NOTE — Anesthesia Preprocedure Evaluation (Signed)
Anesthesia Evaluation  Patient identified by MRN, date of birth, ID band Patient awake    Reviewed: Allergy & Precautions, H&P , NPO status , Patient's Chart, lab work & pertinent test results  Airway Mallampati: II  TM Distance: >3 FB Neck ROM: full    Dental no notable dental hx.    Pulmonary former smoker,    Pulmonary exam normal        Cardiovascular hypertension, Normal cardiovascular exam     Neuro/Psych negative neurological ROS     GI/Hepatic Neg liver ROS, Medicated,  Endo/Other  diabetesHypothyroidism   Renal/GU   negative genitourinary   Musculoskeletal   Abdominal   Peds  Hematology   Anesthesia Other Findings   Reproductive/Obstetrics negative OB ROS                             Anesthesia Physical Anesthesia Plan  ASA: III  Anesthesia Plan: MAC   Post-op Pain Management:    Induction:   PONV Risk Score and Plan:   Airway Management Planned:   Additional Equipment:   Intra-op Plan:   Post-operative Plan:   Informed Consent: I have reviewed the patients History and Physical, chart, labs and discussed the procedure including the risks, benefits and alternatives for the proposed anesthesia with the patient or authorized representative who has indicated his/her understanding and acceptance.     Plan Discussed with:   Anesthesia Plan Comments:         Anesthesia Quick Evaluation

## 2018-03-18 NOTE — Anesthesia Postprocedure Evaluation (Signed)
Anesthesia Post Note  Patient: Bianca Mueller  Procedure(s) Performed: CATARACT EXTRACTION PHACO AND INTRAOCULAR LENS PLACEMENT (IOC) RIGHT DIABETES (Right Eye)  Patient location during evaluation: PACU Anesthesia Type: MAC Level of consciousness: awake and alert Pain management: pain level controlled Vital Signs Assessment: post-procedure vital signs reviewed and stable Respiratory status: spontaneous breathing Cardiovascular status: blood pressure returned to baseline Postop Assessment: no headache Anesthetic complications: no    Jaci Standard, III,  Kathryn Cosby D

## 2018-03-18 NOTE — H&P (Signed)
The History and Physical notes are on paper, have been signed, and are to be scanned.   I have examined the patient and there are no changes to the H&P.   Bianca Mueller 03/18/2018 7:24 AM

## 2018-03-18 NOTE — Anesthesia Procedure Notes (Signed)
Procedure Name: MAC Date/Time: 03/18/2018 7:34 AM Performed by: Lind Guest, CRNA Pre-anesthesia Checklist: Patient identified, Emergency Drugs available, Suction available, Patient being monitored and Timeout performed Patient Re-evaluated:Patient Re-evaluated prior to induction Oxygen Delivery Method: Nasal cannula

## 2018-03-18 NOTE — Op Note (Signed)
OPERATIVE NOTE  Bianca Mueller [id number] 03/18/2018   PREOPERATIVE DIAGNOSIS:  Nuclear sclerotic cataract right eye.  H25.11   POSTOPERATIVE DIAGNOSIS:    Nuclear sclerotic cataract right eye.     PROCEDURE:   1.  Phacoemusification with posterior chamber intraocular lens placement of the right eye  2.  Anterior vitrectomy CPT [id number]  LENS:   Implant Name Type Inv. Item Serial No. Manufacturer Lot No. LRB No. Used  LENS IOL DIOP 23.0 - [id number] Intraocular Lens LENS IOL DIOP 23.0 [id number]  Right 1       PCB00 +23.0   ULTRASOUND TIME: 1 minutes 02 seconds.  CDE 10.62   SURGEON:  Benay Pillow, MD, MPH  ANESTHESIOLOGIST: Anesthesiologist: Elgie Collard, MD CRNA: Lind Guest, CRNA; Janna Arch, CRNA   ANESTHESIA:  Topical with tetracaine drops augmented with 1% preservative-free intracameral lidocaine.  ESTIMATED BLOOD LOSS: less than 1 mL.   COMPLICATIONS:   1.  Posterior capsular tear, right eye with loss of lens fragments to the posterior segment.   DESCRIPTION OF PROCEDURE:  The patient was identified in the holding room and transported to the operating room and placed in the supine position under the operating microscope.  The right eye was identified as the operative eye and it was prepped and draped in the usual sterile ophthalmic fashion.   A 1.0 millimeter clear-corneal paracentesis was made at the 10:30 position. 0.5 ml of preservative-free 1% lidocaine with epinephrine was injected into the anterior chamber.  The anterior chamber was filled with Healon 5 viscoelastic.  A 2.4 millimeter keratome was used to make a near-clear corneal incision at the 8:00 position.  A curvilinear capsulorrhexis was made with a cystotome and capsulorrhexis forceps.  Balanced salt solution was used to hydrodissect and hydrodelineate the nucleus.   Phacoemulsification was then used in stop and chop fashion to remove the lens nucleus and epinucleus.    The posterior  capsule was floppy and at one point during the phaco a small central capsular tear was noted. Viscoat was injected, but a couple of small lens fragments were lost to the posterior segment.    An anterior vitrectomy was performed.  An additional paracentesis was created at 6:00 to allow good access to the subincisional cortex.  Viscoat was then placed into the capsular bag to distend it for lens placement.  The central capsular tear was about 3 mm, so the single piece acyrlic was used.  The lens was then injected into the capsular bag and centered with the anis ball.  The remaining viscoelastic was aspirated with the vitrectomy.   Triescence was injected and no vitreous was noted remaining in the anterior chamber.  Miochol was injected.   Wounds were hydrated with balanced salt solution.  The anterior chamber was inflated to a physiologic pressure with balanced salt solution.   Intracameral vigamox 0.1 mL undiluted was injected into the eye and a drop placed onto the ocular surface.  No wound leaks were noted.  Emycin ophthalmic ointment a patch and shield were placed.  The patient was taken to the recovery room in stable condition.  Benay Pillow 03/18/2018, 8:23 AM

## 2018-03-18 NOTE — Transfer of Care (Signed)
Immediate Anesthesia Transfer of Care Note  Patient: Bianca Mueller  Procedure(s) Performed: CATARACT EXTRACTION PHACO AND INTRAOCULAR LENS PLACEMENT (IOC) RIGHT DIABETES (Right Eye)  Patient Location: PACU  Anesthesia Type: MAC  Level of Consciousness: awake, alert  and patient cooperative  Airway and Oxygen Therapy: Patient Spontanous Breathing and Patient connected to supplemental oxygen  Post-op Assessment: Post-op Vital signs reviewed, Patient's Cardiovascular Status Stable, Respiratory Function Stable, Patent Airway and No signs of Nausea or vomiting  Post-op Vital Signs: Reviewed and stable  Complications: No apparent anesthesia complications

## 2018-03-19 ENCOUNTER — Other Ambulatory Visit: Payer: Self-pay | Admitting: Family Medicine

## 2018-03-19 ENCOUNTER — Encounter: Payer: Self-pay | Admitting: Ophthalmology

## 2018-03-19 DIAGNOSIS — H59021 Cataract (lens) fragments in eye following cataract surgery, right eye: Secondary | ICD-10-CM | POA: Diagnosis not present

## 2018-03-20 NOTE — Telephone Encounter (Signed)
Rx from 02/19/18 wasn't called in so Rx called in as prescribed.

## 2018-03-22 ENCOUNTER — Other Ambulatory Visit: Payer: Self-pay | Admitting: *Deleted

## 2018-03-22 MED ORDER — BUSPIRONE HCL 30 MG PO TABS: 30.0000 mg | ORAL_TABLET | Freq: Two times a day (BID) | ORAL | 1 refills | Status: DC

## 2018-03-27 ENCOUNTER — Other Ambulatory Visit: Payer: Self-pay | Admitting: Family Medicine

## 2018-03-27 DIAGNOSIS — I10 Essential (primary) hypertension: Secondary | ICD-10-CM

## 2018-03-28 ENCOUNTER — Ambulatory Visit (INDEPENDENT_AMBULATORY_CARE_PROVIDER_SITE_OTHER): Payer: Medicare HMO | Admitting: General Practice

## 2018-03-28 DIAGNOSIS — I4892 Unspecified atrial flutter: Secondary | ICD-10-CM

## 2018-03-28 DIAGNOSIS — Z7901 Long term (current) use of anticoagulants: Secondary | ICD-10-CM

## 2018-03-28 DIAGNOSIS — I4891 Unspecified atrial fibrillation: Secondary | ICD-10-CM

## 2018-03-28 LAB — POCT INR: INR: 4 — AB (ref 2.0–3.0)

## 2018-03-28 NOTE — Patient Instructions (Addendum)
Pre visit review using our clinic review tool, if applicable. No additional management support is needed unless otherwise documented below in the visit note.  Hold coumadin today and tomorrow and then change dosage and take 1 tablet on Sunday, Tues, Thurs and Saturday and take 1/2 tablet on Wednesday and nothing on Monday and Fridays.  Re-check in 2 weeks.  Daughter, Lugene notified of results and instructions for recheck.

## 2018-04-04 ENCOUNTER — Other Ambulatory Visit: Payer: Self-pay | Admitting: *Deleted

## 2018-04-04 MED ORDER — CLONAZEPAM 0.5 MG PO TABS: 0.5000 mg | ORAL_TABLET | Freq: Every day | ORAL | 0 refills | Status: DC

## 2018-04-04 NOTE — Telephone Encounter (Signed)
Patient has previously been getting the Clonazepam from the local pharmacy but now is requesting 90 day supplies from [facility].  Is this ok? Last office visit:  03/06/18 Last Filled:    30 tablet 3 02/19/2018  Please advise.

## 2018-04-04 NOTE — Telephone Encounter (Signed)
I sent it  

## 2018-04-10 ENCOUNTER — Ambulatory Visit (INDEPENDENT_AMBULATORY_CARE_PROVIDER_SITE_OTHER): Payer: Medicare HMO

## 2018-04-10 DIAGNOSIS — Z7901 Long term (current) use of anticoagulants: Secondary | ICD-10-CM | POA: Diagnosis not present

## 2018-04-10 DIAGNOSIS — I4892 Unspecified atrial flutter: Secondary | ICD-10-CM

## 2018-04-10 DIAGNOSIS — I4891 Unspecified atrial fibrillation: Secondary | ICD-10-CM

## 2018-04-10 LAB — POCT INR: INR: 3.2 — AB (ref 2.0–3.0)

## 2018-04-10 NOTE — Patient Instructions (Signed)
INR today 3.2   Hold dose today (04/10/18) and then continue same dosing of 0mg  on Mondays and Fridays, 1.5mg  on Wednesday and 3 mg on Sun, Tues, Thurs, Saturday.  Re-check in 2 weeks.  Daughter, Lugene notified of results and instructions for recheck.

## 2018-04-11 ENCOUNTER — Ambulatory Visit: Payer: Medicare HMO

## 2018-04-25 ENCOUNTER — Ambulatory Visit (INDEPENDENT_AMBULATORY_CARE_PROVIDER_SITE_OTHER): Payer: Medicare HMO | Admitting: General Practice

## 2018-04-25 DIAGNOSIS — Z7901 Long term (current) use of anticoagulants: Secondary | ICD-10-CM

## 2018-04-25 DIAGNOSIS — I4891 Unspecified atrial fibrillation: Secondary | ICD-10-CM

## 2018-04-25 DIAGNOSIS — I4892 Unspecified atrial flutter: Secondary | ICD-10-CM

## 2018-04-25 LAB — POCT INR: INR: 2.2 (ref 2.0–3.0)

## 2018-04-25 NOTE — Patient Instructions (Addendum)
Pre visit review using our clinic review tool, if applicable. No additional management support is needed unless otherwise documented below in the visit note.  Continue same dosing of 0mg  on Mondays and Fridays, 1.5mg  on Wednesday and 3 mg on Sun, Tues, Thurs, Saturday.  Re-check in 3 weeks.  Daughter, Lugene notified of results and instructions for recheck.

## 2018-05-03 ENCOUNTER — Encounter: Payer: Self-pay | Admitting: Family Medicine

## 2018-05-03 MED ORDER — SERTRALINE HCL 50 MG PO TABS: 50.0000 mg | ORAL_TABLET | Freq: Every day | ORAL | 11 refills | Status: DC

## 2018-05-03 NOTE — Telephone Encounter (Signed)
-----   Message from Josetta Huddle, [place] sent at 05/03/2018  4:17 PM EDT ----- Joelene Millin has discussed this with one of the other pharmacists and they were wondering if Zoloft might help.  They saw that Seroquel could hasten dementia but to me, it's already hastening.  We will do whatever you think is best.

## 2018-05-03 NOTE — Telephone Encounter (Signed)
Trial of zoloft

## 2018-05-06 ENCOUNTER — Other Ambulatory Visit: Payer: Self-pay

## 2018-05-06 MED ORDER — OMEPRAZOLE 20 MG PO CPDR: 20.0000 mg | DELAYED_RELEASE_CAPSULE | Freq: Every day | ORAL | 3 refills | Status: DC

## 2018-05-06 MED ORDER — WARFARIN SODIUM 3 MG PO TABS: ORAL_TABLET | ORAL | 1 refills | Status: DC

## 2018-05-06 NOTE — Telephone Encounter (Signed)
Rx refill requests for warfarin and omeprazole from [facility].  Patient is compliant with medication and coumadin management.  Will refill coumadin X 52mths and omeprazole X 1 year.

## 2018-05-16 ENCOUNTER — Ambulatory Visit (INDEPENDENT_AMBULATORY_CARE_PROVIDER_SITE_OTHER): Payer: Medicare HMO | Admitting: General Practice

## 2018-05-16 DIAGNOSIS — I4891 Unspecified atrial fibrillation: Secondary | ICD-10-CM

## 2018-05-16 DIAGNOSIS — I4892 Unspecified atrial flutter: Secondary | ICD-10-CM

## 2018-05-16 DIAGNOSIS — Z7901 Long term (current) use of anticoagulants: Secondary | ICD-10-CM

## 2018-05-16 LAB — POCT INR: INR: 1.9 — AB (ref 2.0–3.0)

## 2018-05-16 NOTE — Patient Instructions (Addendum)
Pre visit review using our clinic review tool, if applicable. No additional management support is needed unless otherwise documented below in the visit note.  Take 1 1/2 tablets today and then continue same dosing of 0mg  on Mondays and Fridays, 1.5mg  on Wednesday and 3 mg on Sun, Tues, Thurs, Saturday.  Re-check in 4 weeks.  Daughter, Lugene notified of results and instructions for recheck.

## 2018-05-23 ENCOUNTER — Encounter: Payer: Self-pay | Admitting: Family Medicine

## 2018-05-23 ENCOUNTER — Ambulatory Visit (INDEPENDENT_AMBULATORY_CARE_PROVIDER_SITE_OTHER): Payer: Medicare HMO | Admitting: Family Medicine

## 2018-05-23 VITALS — BP 124/62 | HR 55 | Temp 97.5°F | Ht 61.0 in | Wt 125.5 lb

## 2018-05-23 DIAGNOSIS — F0390 Unspecified dementia without behavioral disturbance: Secondary | ICD-10-CM | POA: Diagnosis not present

## 2018-05-23 DIAGNOSIS — S91001A Unspecified open wound, right ankle, initial encounter: Secondary | ICD-10-CM

## 2018-05-23 DIAGNOSIS — S91009A Unspecified open wound, unspecified ankle, initial encounter: Secondary | ICD-10-CM | POA: Insufficient documentation

## 2018-05-23 MED ORDER — CEPHALEXIN 500 MG PO CAPS: 500.0000 mg | ORAL_CAPSULE | Freq: Three times a day (TID) | ORAL | 0 refills | Status: DC

## 2018-05-23 NOTE — Assessment & Plan Note (Signed)
R lateral malleolus - dry / likely very early ulcer formation surrounded by 1 cm collar of erythema (marked with sharpie) and swelling  Very mildly tender and no drainage  tx with keflex 500 tid for poss early cellulitis with close f/u  Urged strongly to elevate when able , keep clean with soap and water and abx ointment  Avoid pressure (esp when sleeping- foam or pillow if necessary)  Update if not starting to improve in a week or if worsening

## 2018-05-23 NOTE — Assessment & Plan Note (Signed)
This continues to progress  zoloft and buspar for mood Frequently confused and becoming less independent  Family may need help with her care soon

## 2018-05-23 NOTE — Patient Instructions (Signed)
You have a wound with possible early ulcer on the right ankle  Cushion the area with foam or pillow if you lie on the Right side (avoid pressure on the area)  Keep clean with soap and water twice daily  Antibiotic ointment is fine  Loose dressing is fine if needed  Take keflex as directed for 10 days  If the redness or swelling worsen (extend past the line) or if more pain or new symptoms like fever- alert me please   Come back for a re check if needed / if not significantly improved in 10 days

## 2018-05-23 NOTE — Progress Notes (Signed)
   Subjective:    Patient ID: Bianca Mueller, female    DOB: Nov 20, 1925, 84 y.o.   MRN: [id number]  HPI Here for wound on R foot  Present for 10 days or so   Some tenderness and swelling  Hurts a bit to walk on it this am  No fever    No trauma recalled- but pt has dementia  No fever   Is diabetic   Wt Readings from Last 3 Encounters:  05/23/18 125 lb 8 oz (56.9 kg)  03/18/18 122 lb (55.3 kg)  03/06/18 126 lb 8 oz (57.4 kg)   23.71 kg/m    Review of Systems     Objective:   Physical Exam  Constitutional: She is oriented to person, place, and time. She appears well-developed and well-nourished. No distress.  Frail appearing elderly female with dementia   HENT:  Head: Normocephalic and atraumatic.  Eyes: Pupils are equal, round, and reactive to light. Conjunctivae and EOM are normal. No scleral icterus.  Neck: Normal range of motion. Neck supple.  Cardiovascular: Normal heart sounds.  Pulmonary/Chest: Effort normal and breath sounds normal. No stridor. No respiratory distress. She has no wheezes.  Musculoskeletal: She exhibits tenderness. She exhibits no deformity.  Mild tenderness of erythematous area around R lateral malleolus   Lymphadenopathy:    She has no cervical adenopathy.  Neurological: She is alert and oriented to person, place, and time.  Nl gait with walker   Skin: Skin is warm and dry. She is not diaphoretic. There is erythema.  4 mm circular wound on R ankle (lateral malleolus) with some pale granulation tissue (dry) surrounded by 1-1.5 cm of erythema and mild swelling (this area is tender)  No drainage   Reduced sens in foot but not ankle Nl rom of ankle and perfusion is intact  Psychiatric: She has a normal mood and affect. Her speech is normal. She is not agitated and not aggressive. Thought content is not paranoid. Cognition and memory are impaired. She exhibits abnormal recent memory.  Pleasant - answers questions (with some  hesitation) Confuses easily  Dementia apparent           Assessment & Plan:   Problem List Items Addressed This Visit      Nervous and Auditory   Dementia, senile    This continues to progress  zoloft and buspar for mood Frequently confused and becoming less independent  Family may need help with her care soon        Other   Wound of ankle - Primary    R lateral malleolus - dry / likely very early ulcer formation surrounded by 1 cm collar of erythema (marked with sharpie) and swelling  Very mildly tender and no drainage  tx with keflex 500 tid for poss early cellulitis with close f/u  Urged strongly to elevate when able , keep clean with soap and water and abx ointment  Avoid pressure (esp when sleeping- foam or pillow if necessary)  Update if not starting to improve in a week or if worsening

## 2018-06-10 ENCOUNTER — Other Ambulatory Visit: Payer: Self-pay | Admitting: *Deleted

## 2018-06-10 MED ORDER — SITAGLIPTIN PHOSPHATE 100 MG PO TABS: 100.0000 mg | ORAL_TABLET | Freq: Every day | ORAL | 1 refills | Status: DC

## 2018-06-13 ENCOUNTER — Ambulatory Visit (INDEPENDENT_AMBULATORY_CARE_PROVIDER_SITE_OTHER): Payer: Medicare HMO | Admitting: General Practice

## 2018-06-13 DIAGNOSIS — Z7901 Long term (current) use of anticoagulants: Secondary | ICD-10-CM

## 2018-06-13 DIAGNOSIS — I4892 Unspecified atrial flutter: Secondary | ICD-10-CM

## 2018-06-13 DIAGNOSIS — I4891 Unspecified atrial fibrillation: Secondary | ICD-10-CM

## 2018-06-13 LAB — POCT INR: INR: 1.8 — AB (ref 2.0–3.0)

## 2018-06-13 NOTE — Patient Instructions (Addendum)
Pre visit review using our clinic review tool, if applicable. No additional management support is needed unless otherwise documented below in the visit note.  Take 1 1/2 tablets today and then change dosage and take 1 tablet daily except nothing on Mondays and Fridays.  Re-check in 4 weeks.  Daughter, Lugene notified of results and instructions for recheck.

## 2018-06-23 ENCOUNTER — Other Ambulatory Visit: Payer: Self-pay | Admitting: Family Medicine

## 2018-06-24 MED ORDER — SERTRALINE HCL 50 MG PO TABS: 50.0000 mg | ORAL_TABLET | Freq: Every day | ORAL | 3 refills | Status: DC

## 2018-06-24 NOTE — Addendum Note (Signed)
Addended by: Tammi Sou on: 06/24/2018 04:41 PM   Modules accepted: Orders

## 2018-06-24 NOTE — Telephone Encounter (Signed)
Name of Medication: Kyle Name of Pharmacy: [facility] or Written Date and Quantity: 04/04/18 #90 with 0 refills Last Office Visit and Type: Wound Check on 05/23/18 Next Office Visit and Type: CPE on 11/06/18 Last Controlled Substance Agreement Date: never done Last UDS: never done  **it's a little early due to it being filled by mail order pharmacy, it could take up to 2 weeks to receive it in the mail**

## 2018-06-27 ENCOUNTER — Other Ambulatory Visit: Payer: Self-pay | Admitting: *Deleted

## 2018-06-28 ENCOUNTER — Ambulatory Visit (INDEPENDENT_AMBULATORY_CARE_PROVIDER_SITE_OTHER): Payer: Medicare HMO | Admitting: *Deleted

## 2018-06-28 DIAGNOSIS — Z23 Encounter for immunization: Secondary | ICD-10-CM

## 2018-07-01 ENCOUNTER — Ambulatory Visit (INDEPENDENT_AMBULATORY_CARE_PROVIDER_SITE_OTHER): Payer: Medicare HMO | Admitting: Family Medicine

## 2018-07-01 ENCOUNTER — Encounter: Payer: Self-pay | Admitting: Family Medicine

## 2018-07-01 DIAGNOSIS — S81819A Laceration without foreign body, unspecified lower leg, initial encounter: Secondary | ICD-10-CM | POA: Diagnosis not present

## 2018-07-01 MED ORDER — CEPHALEXIN 500 MG PO CAPS: 500.0000 mg | ORAL_CAPSULE | Freq: Three times a day (TID) | ORAL | 0 refills | Status: DC

## 2018-07-01 MED ORDER — DOXAZOSIN MESYLATE 4 MG PO TABS: 4.0000 mg | ORAL_TABLET | Freq: Every day | ORAL | Status: DC

## 2018-07-01 MED ORDER — METOPROLOL TARTRATE 25 MG PO TABS: 12.5000 mg | ORAL_TABLET | Freq: Two times a day (BID) | ORAL | Status: DC

## 2018-07-01 MED ORDER — SILVER SULFADIAZINE 1 % EX CREA: 1.0000 "application " | TOPICAL_CREAM | Freq: Every day | CUTANEOUS | 0 refills | Status: DC

## 2018-07-01 NOTE — Patient Instructions (Signed)
Stop metoprolol.  Use silvadene with nonstick bandages.  Change daily.  Start keflex.  Update [place] as needed.  Take care.  Glad to see you.

## 2018-07-01 NOTE — Progress Notes (Signed)
Fell 06/25/18.  No LOC.  She thought she could have been lightheaded but not since then.  Skin tear on R shin, when she hit her walker.  No other pain.  No pain locally except with walking.  No fevers known.  No drainage now but some initially.  It appeared to be healing then looked more irritated in the meantime.  On coumadin.  Here for evaluation today.  No other acute complaints.  Meds, vitals, and allergies reviewed.   ROS: Per HPI unless specifically indicated in ROS section   nad ncat rrr ctab trace BLE edema.  2x3 skin tear on the R shin 2x3 skin tear on the R shin 4x2 skin tear on the R shin There is some peripheral irritation but no draining pus or fluctuant mass associated with the lesions.  She has some granulation tissue forming on the skin tears.

## 2018-07-02 NOTE — Assessment & Plan Note (Signed)
Discussed options with patient, daughter, PCP.  Wounds dressed with nonstick bandage and Silvadene cream.  Tolerated well.  Reasonable to start Keflex.  She should be able to tolerate that.  Routine care otherwise.  See after visit summary.  Update [place] as needed.  Okay for outpatient follow-up.

## 2018-07-11 ENCOUNTER — Telehealth: Payer: Self-pay | Admitting: Family Medicine

## 2018-07-11 ENCOUNTER — Ambulatory Visit (INDEPENDENT_AMBULATORY_CARE_PROVIDER_SITE_OTHER): Payer: Medicare HMO | Admitting: General Practice

## 2018-07-11 DIAGNOSIS — I4892 Unspecified atrial flutter: Secondary | ICD-10-CM

## 2018-07-11 DIAGNOSIS — I4891 Unspecified atrial fibrillation: Secondary | ICD-10-CM

## 2018-07-11 DIAGNOSIS — Z7901 Long term (current) use of anticoagulants: Secondary | ICD-10-CM | POA: Diagnosis not present

## 2018-07-11 LAB — POCT INR: INR: 2.2 (ref 2.0–3.0)

## 2018-07-11 MED ORDER — CEPHALEXIN 500 MG PO CAPS: 500.0000 mg | ORAL_CAPSULE | Freq: Three times a day (TID) | ORAL | 0 refills | Status: DC

## 2018-07-11 NOTE — Telephone Encounter (Signed)
Patient seen briefly at coumadin visit.  R shin lesions appear to healing slowly with some peripheral irritation but no sign of active infection. She has some chronic changes related to silvadene use.  Would hold off on using keflex at this point but rx printed to hold.  Can start if signs of active infection and update [place].  Patient and daughter agree with plan.  Continue with routine dressing changes and silvadene.

## 2018-07-11 NOTE — Patient Instructions (Addendum)
Pre visit review using our clinic review tool, if applicable. No additional management support is needed unless otherwise documented below in the visit note.  Continue to take 1 tablet daily except nothing on Mondays and Fridays.  Re-check in 4 weeks.  Daughter, Lugene notified of results and instructions for recheck.      

## 2018-07-17 ENCOUNTER — Encounter: Payer: Self-pay | Admitting: Family Medicine

## 2018-07-17 ENCOUNTER — Ambulatory Visit (INDEPENDENT_AMBULATORY_CARE_PROVIDER_SITE_OTHER)
Admission: RE | Admit: 2018-07-17 | Discharge: 2018-07-17 | Disposition: A | Discharge status: Home / Self Care | Payer: Medicare HMO | Source: Ambulatory Visit | Attending: Family Medicine | Admitting: Family Medicine

## 2018-07-17 ENCOUNTER — Ambulatory Visit (INDEPENDENT_AMBULATORY_CARE_PROVIDER_SITE_OTHER): Payer: Medicare HMO | Admitting: Family Medicine

## 2018-07-17 VITALS — BP 146/72 | HR 74 | Temp 98.0°F | Ht 61.0 in

## 2018-07-17 DIAGNOSIS — Y92009 Unspecified place in unspecified non-institutional (private) residence as the place of occurrence of the external cause: Secondary | ICD-10-CM | POA: Diagnosis not present

## 2018-07-17 DIAGNOSIS — R0789 Other chest pain: Secondary | ICD-10-CM

## 2018-07-17 DIAGNOSIS — W19XXXA Unspecified fall, initial encounter: Secondary | ICD-10-CM | POA: Diagnosis not present

## 2018-07-17 DIAGNOSIS — S2232XA Fracture of one rib, left side, initial encounter for closed fracture: Secondary | ICD-10-CM | POA: Diagnosis not present

## 2018-07-17 MED ORDER — TRAMADOL HCL 50 MG PO TABS: 25.0000 mg | ORAL_TABLET | Freq: Three times a day (TID) | ORAL | 1 refills | Status: DC | PRN

## 2018-07-17 NOTE — Assessment & Plan Note (Signed)
Fall on Monday into a bed - with rib/ chest wall injury  Disc tx of injury  Also fall prev- needs to have caregiver with her at all times when ambulating

## 2018-07-17 NOTE — Assessment & Plan Note (Signed)
Due to injury from fall  L sided anterolat cw pain with posterior ecchymosis  Rib films today -pend rad rev Disc use of heat/analgesic and enc frequent deep breaths to prevent atelectasis  Assistance with ambulation at all times Tramadol with caution of sedation/falls and also constipation

## 2018-07-17 NOTE — Progress Notes (Signed)
Subjective:    Patient ID: Bianca Mueller, female    DOB: [birth year], 84 y.o.   MRN: [id number]  HPI Here for fall on left side  Monday night   Wt Readings from Last 3 Encounters:  05/23/18 125 lb 8 oz (56.9 kg)  03/18/18 122 lb (55.3 kg)  03/06/18 126 lb 8 oz (57.4 kg)   23.71 kg/m   Fell against the bed (when getting remote control from across the room) Says she had walker (uses it all the time)  Caregiver was out of the room  Was able to get her up and moving  Pain got worse the next day  Hit her left chest/ribs  It hurts to move Also occ spasm  Hurts to take a deep breath  No cough   Tylenol does not help    Pulse ox 96%   BP Readings from Last 3 Encounters:  07/17/18 (!) 146/72  07/01/18 112/70  05/23/18 124/62   Patient Active Problem List   Diagnosis Date Noted  . Fall at home 07/17/2018  . Left-sided chest wall pain 07/17/2018  . Wound of ankle 05/23/2018  . Elevated serum creatinine 03/08/2018  . Hypothyroid 02/28/2018  . Dementia, senile ([place]) 01/16/2018  . Polyuria 11/08/2017  . Medicare annual wellness visit, subsequent 10/24/2017  . Insomnia disorder 08/15/2017  . Long term (current) use of anticoagulants 06/26/2017  . At high risk for falls 11/16/2016  . Routine general medical examination at a health care facility 08/03/2016  . Anxiety 11/08/2015  . Gait disorder 09/21/2015  . History of fall 08/24/2015  . Hearing loss 08/24/2015  . Acute on chronic diastolic heart failure ([place]) 08/07/2014  . Encounter for therapeutic drug monitoring 08/06/2014  . Chronic diastolic CHF (congestive heart failure) ([place])   . CAD (coronary artery disease)   . Atrial fibrillation ([place])   . Transaminitis 07/20/2014  . Atrial flutter ([place]) 07/20/2014  . NSTEMI (non-ST elevated myocardial infarction) ([place]) 07/18/2014  . Encounter for Medicare annual wellness exam 08/19/2013  . Skin tear of lower leg without complication, initial encounter 03/16/2013  .  Other screening mammogram 05/17/2012  . Adult failure to thrive 05/17/2011  . DM2 (diabetes mellitus, type 2) ([place]) 02/14/2011  . ANEMIA, VITAMIN B12 DEFICIENCY 09/26/2010  . PEDAL EDEMA 03/16/2010  . [place] ALLERGY 09/06/2009  . Vitamin D deficiency 03/02/2009  . DIVERTICULITIS, COLON 11/12/2008  . COLONIC POLYPS, HX OF 09/02/2008  . HYPERCHOLESTEROLEMIA 12/10/2006  . Essential hypertension 12/10/2006  . Osteoporosis 03/18/2002   Past Medical History:  Diagnosis Date  . Arthritis    "hips" (08/07/2014)  . CAD (coronary artery disease)    a. 1989 s/p MI, TPA, PTCA;  b. 06/2014 NSTEMI/Cath: LM nl, LAD 30-40, D2 90, small, LCX 110m OM1 90, RCA Cs2+ 70-80p, 50-676m90d (? ruptured plaque), EF 55-60%-->Med Rx.  . Chronic diastolic CHF (congestive heart failure) ([facility]   a. 07/2014 Echo: EF 60-65%, Gr 1 DD, mild to mod TR, PASP 3714m.  . Diverticulosis   . Elevated LFTs    a. 06/2014 Abd U/S: no GB pathology.  . GMarland KitchenRD (gastroesophageal reflux disease)   . Hx of adenomatous colonic polyps 2010   last colonscopy 2010,Dr EllVira Agarno need for further  colonoscopy given age  . Hypercalcemia   . Hyperlipidemia   . Hypertension   . Myocardial infarction ([facility]  . Osteoporosis   . PAF/PAFlutter    a. 06/2014->amio and coumadin started.  . Type II diabetes mellitus (  [place])   . Wears dentures    partial lower  . Zoster    past   Past Surgical History:  Procedure Laterality Date  . CARDIAC CATHETERIZATION  07/22/2014  . CATARACT EXTRACTION W/PHACO Right 03/18/2018   Procedure: CATARACT EXTRACTION PHACO AND INTRAOCULAR LENS PLACEMENT ([place]) RIGHT DIABETES;  Surgeon: Eulogio Bear, MD;  Location: [facility];  Service: Ophthalmology;  Laterality: Right;  Diabetes - oral meds  . CORONARY ANGIOPLASTY  1989   "@ [facility]"  . LEFT HEART CATHETERIZATION WITH CORONARY ANGIOGRAM N/A 07/22/2014   Procedure: LEFT HEART CATHETERIZATION WITH CORONARY ANGIOGRAM;  Surgeon: Sinclair Grooms, MD;  Location: [facility] CATH LAB;  Service: Cardiovascular;  Laterality: N/A;  . SHOULDER OPEN ROTATOR CUFF REPAIR Right 1980's  . VAGINAL HYSTERECTOMY  1970's?   ovaries intact   Social History   Tobacco Use  . Smoking status: Former Smoker    Packs/day: 0.25    Years: 30.00    Pack years: 7.50    Types: Cigarettes  . Smokeless tobacco: Never Used  . Tobacco comment: "quit smoking in the 1980's"  Substance Use Topics  . Alcohol use: No    Alcohol/week: 0.0 standard drinks  . Drug use: No   Family History  Problem Relation Age of Onset  . Heart disease Mother        CAD  . Heart disease Father        CAD  . Heart disease Brother        CAD   Allergies  Allergen Reactions  . Alendronate Sodium Other (See Comments)    unknown  . Doxycycline Other (See Comments)    Inc LFT  . Isosorbide Nitrate Other (See Comments)    Doesn't remember reaction  . Lidocaine Nausea Only    Given at Dentist office  . Metformin And Related     Decreases appetite resulting in weight loss  . Metronidazole Nausea And Vomiting    REACTION: sick/ GI  . Nifedipine Other (See Comments)    Doesn't remember reaction   Current Outpatient Medications on File Prior to Visit  Medication Sig Dispense Refill  . ACCU-CHEK FASTCLIX LANCETS MISC Use as instructed to test blood sugar once daily or as needed.  Diagnosis:  E11.9   Non insulin-dependent. 102 each 4  . ALPRAZolam (XANAX) 0.25 MG tablet Take 0.25 mg by mouth as needed.    Marland Kitchen amiodarone (PACERONE) 200 MG tablet Take 200 mg by mouth daily.    . Blood Glucose Monitoring Suppl (ACCU-CHEK AVIVA PLUS) W/DEVICE KIT Use as instructed to test blood sugar once daily or as needed.  Diagnosis:  E11.9  Non insulin-dependent. 1 kit 0  . busPIRone (BUSPAR) 30 MG tablet Take 1 tablet (30 mg total) by mouth 2 (two) times daily. 180 tablet 1  . cholecalciferol (VITAMIN D) 1000 UNITS tablet Take 2,000 Units by mouth daily at 12 noon.     . clonazePAM (KLONOPIN)  0.5 MG tablet TAKE 1 TABLET AT BEDTIME 90 tablet 0  . cloNIDine (CATAPRES) 0.2 MG tablet TAKE 1 TABLET TWICE DAILY 180 tablet 3  . diphenhydrAMINE (BENADRYL) 25 MG tablet Take 25 mg by mouth at bedtime as needed.    . docusate sodium 100 MG CAPS Take 100 mg by mouth 2 (two) times daily. 10 capsule 0  . doxazosin (CARDURA) 4 MG tablet Take 1 tablet (4 mg total) by mouth daily.    . furosemide (LASIX) 40 MG tablet TAKE 1 TABLET  DAILY. AS DIRECTED BY CARDIOLOGIST,  MAY TAKE ADDITIONAL DOSES AS NEEDED 180 tablet 2  . glipiZIDE (GLUCOTROL XL) 2.5 MG 24 hr tablet TAKE 1 TABLET DAILY WITH LUNCH. 90 tablet 1  . glucose blood (ACCU-CHEK AVIVA PLUS) test strip USE AS INSTRUCTED TO CHECK BLOOD SUGAR ONCE DAILY OR AS NEEDED.  Dx code E11.59 100 each 3  . hydrALAZINE (APRESOLINE) 100 MG tablet TAKE 1 TABLET THREE TIMES DAILY 270 tablet 3  . levothyroxine (SYNTHROID, LEVOTHROID) 25 MCG tablet TAKE 1 TABLET EVERY DAY BEFORE BREAKFAST 90 tablet 3  . lisinopril (PRINIVIL,ZESTRIL) 20 MG tablet TAKE 1 TABLET EVERY DAY 90 tablet 1  . nitroGLYCERIN (NITROSTAT) 0.4 MG SL tablet Place 1 tablet (0.4 mg total) under the tongue every 5 (five) minutesas needed for chest pain. If not improved seek medical attention in ER 25 tablet 1  . omeprazole (PRILOSEC) 20 MG capsule Take 1 capsule (20 mg total) by mouth daily. 90 capsule 3  . sertraline (ZOLOFT) 50 MG tablet Take 1 tablet (50 mg total) by mouth daily. 90 tablet 3  . silver sulfADIAZINE (SILVADENE) 1 % cream Apply 1 application topically daily. 50 g 0  . sitaGLIPtin (JANUVIA) 100 MG tablet Take 1 tablet (100 mg total) by mouth daily. 90 tablet 1  . vitamin B-12 (CYANOCOBALAMIN) 1000 MCG tablet Take 1,000 mcg by mouth every other day.     . warfarin (COUMADIN) 3 MG tablet Take 1/2 -1 pill daily as directed by coumadin clinic. 70 tablet 1  . cephALEXin (KEFLEX) 500 MG capsule Take 1 capsule (500 mg total) by mouth 3 (three) times daily. (Patient not taking: Reported on  07/17/2018) 21 capsule 0   No current facility-administered medications on file prior to visit.     Review of Systems  Constitutional: Negative for activity change, appetite change, fatigue, fever and unexpected weight change.  HENT: Negative for congestion, ear pain, rhinorrhea, sinus pressure and sore throat.   Eyes: Negative for pain, redness and visual disturbance.  Respiratory: Negative for cough, shortness of breath and wheezing.        Chest wall pain - Left side worse with deep breath  Cardiovascular: Negative for chest pain and palpitations.  Gastrointestinal: Negative for abdominal pain, blood in stool, constipation and diarrhea.  Endocrine: Negative for polydipsia and polyuria.  Genitourinary: Negative for dysuria, frequency and urgency.  Musculoskeletal: Negative for arthralgias, back pain and myalgias.  Skin: Negative for pallor and rash.  Allergic/Immunologic: Negative for environmental allergies.  Neurological: Negative for dizziness, syncope and headaches.       Poor balance No dizziness  No head injury  Hematological: Negative for adenopathy. Does not bruise/bleed easily.  Psychiatric/Behavioral: Negative for decreased concentration and dysphoric mood. The patient is not nervous/anxious.        Dementia       Objective:   Physical Exam  Constitutional: She appears well-developed and well-nourished. No distress.  Frail appearing elderly female in wheelchair   HENT:  Head: Normocephalic and atraumatic.  Eyes: Pupils are equal, round, and reactive to light. Conjunctivae and EOM are normal. No scleral icterus.  Neck: Normal range of motion. Neck supple.  Cardiovascular: Normal rate, regular rhythm and normal heart sounds.  Pulmonary/Chest: Breath sounds normal. No stridor. No respiratory distress. She has no wheezes. She has no rales. She exhibits tenderness.  L anterolat chest wall tenderness  No step off on palpation  4-5 cm area of old ecchymosis on back  No  crepitus or other skin  change   Abdominal: Soft. Bowel sounds are normal. She exhibits no distension and no mass. There is no tenderness. There is no guarding.  Musculoskeletal: She exhibits tenderness. She exhibits no edema.  Lymphadenopathy:    She has no cervical adenopathy.  Neurological: She is alert. She exhibits normal muscle tone.  Skin: Skin is warm and dry. No rash noted. No erythema. No pallor.  Healing skin tears on R lower leg-3 areas with eschar  No ulceration   Ecchymosis on L mid back -corresp to injury  Psychiatric: She has a normal mood and affect.  Pleasant Dementia baseline          Assessment & Plan:   Problem List Items Addressed This Visit      Other   Fall at home    Fall on Monday into a bed - with rib/ chest wall injury  Disc tx of injury  Also fall prev- needs to have caregiver with her at all times when ambulating        Left-sided chest wall pain - Primary    Due to injury from fall  L sided anterolat cw pain with posterior ecchymosis  Rib films today -pend rad rev Disc use of heat/analgesic and enc frequent deep breaths to prevent atelectasis  Assistance with ambulation at all times Tramadol with caution of sedation/falls and also constipation        Relevant Orders   DG Ribs Unilateral Left (Completed)

## 2018-07-17 NOTE — Patient Instructions (Signed)
Warm compress on sore area Grab a pillow and take a deep breath every 30 minutes when able Watch for cough or shortness of breath   Try tramadol 1/2 to 1 pill up to three times daily for pain  Use with caution of sedation and falls Have your caregiver with you whenever you move/ambulate  Update if not starting to improve in a week or if worsening    Xray now and we will report back to you asap

## 2018-07-25 ENCOUNTER — Other Ambulatory Visit: Payer: Self-pay | Admitting: *Deleted

## 2018-07-25 MED ORDER — CLONAZEPAM 0.5 MG PO TABS: 0.5000 mg | ORAL_TABLET | Freq: Every day | ORAL | 0 refills | Status: DC

## 2018-07-25 NOTE — Telephone Encounter (Signed)
Patient has had this filled at mail order pharmacy but they will only fill 30 tablets at a time and charge the same as for 90.  Patient wants this filled at local pharmacy, [facility], please.

## 2018-08-02 ENCOUNTER — Emergency Department: Payer: Medicare HMO

## 2018-08-02 ENCOUNTER — Emergency Department
Admission: EM | Admit: 2018-08-02 | Discharge: 2018-08-02 | Disposition: A | Discharge status: Home / Self Care | Payer: Medicare HMO | Attending: Emergency Medicine | Admitting: Emergency Medicine

## 2018-08-02 DIAGNOSIS — Z87891 Personal history of nicotine dependence: Secondary | ICD-10-CM | POA: Diagnosis not present

## 2018-08-02 DIAGNOSIS — Y92018 Other place in single-family (private) house as the place of occurrence of the external cause: Secondary | ICD-10-CM | POA: Diagnosis not present

## 2018-08-02 DIAGNOSIS — Z7901 Long term (current) use of anticoagulants: Secondary | ICD-10-CM | POA: Diagnosis not present

## 2018-08-02 DIAGNOSIS — S3992XA Unspecified injury of lower back, initial encounter: Secondary | ICD-10-CM | POA: Diagnosis not present

## 2018-08-02 DIAGNOSIS — M5489 Other dorsalgia: Secondary | ICD-10-CM | POA: Diagnosis not present

## 2018-08-02 DIAGNOSIS — Z79899 Other long term (current) drug therapy: Secondary | ICD-10-CM | POA: Diagnosis not present

## 2018-08-02 DIAGNOSIS — R000 Tachycardia, unspecified: Secondary | ICD-10-CM | POA: Diagnosis not present

## 2018-08-02 DIAGNOSIS — S0990XA Unspecified injury of head, initial encounter: Secondary | ICD-10-CM | POA: Diagnosis not present

## 2018-08-02 DIAGNOSIS — E119 Type 2 diabetes mellitus without complications: Secondary | ICD-10-CM | POA: Insufficient documentation

## 2018-08-02 DIAGNOSIS — R51 Headache: Secondary | ICD-10-CM | POA: Diagnosis not present

## 2018-08-02 DIAGNOSIS — E039 Hypothyroidism, unspecified: Secondary | ICD-10-CM | POA: Insufficient documentation

## 2018-08-02 DIAGNOSIS — S22000A Wedge compression fracture of unspecified thoracic vertebra, initial encounter for closed fracture: Secondary | ICD-10-CM | POA: Diagnosis not present

## 2018-08-02 DIAGNOSIS — I700 Atherosclerosis of aorta: Secondary | ICD-10-CM

## 2018-08-02 DIAGNOSIS — Y9389 Activity, other specified: Secondary | ICD-10-CM | POA: Diagnosis not present

## 2018-08-02 DIAGNOSIS — S299XXA Unspecified injury of thorax, initial encounter: Secondary | ICD-10-CM | POA: Diagnosis not present

## 2018-08-02 DIAGNOSIS — I5032 Chronic diastolic (congestive) heart failure: Secondary | ICD-10-CM | POA: Insufficient documentation

## 2018-08-02 DIAGNOSIS — I10 Essential (primary) hypertension: Secondary | ICD-10-CM | POA: Diagnosis not present

## 2018-08-02 DIAGNOSIS — I110 Hypertensive heart disease with heart failure: Secondary | ICD-10-CM | POA: Diagnosis not present

## 2018-08-02 DIAGNOSIS — Y92009 Unspecified place in unspecified non-institutional (private) residence as the place of occurrence of the external cause: Secondary | ICD-10-CM

## 2018-08-02 DIAGNOSIS — W19XXXA Unspecified fall, initial encounter: Secondary | ICD-10-CM

## 2018-08-02 DIAGNOSIS — M546 Pain in thoracic spine: Secondary | ICD-10-CM | POA: Diagnosis not present

## 2018-08-02 DIAGNOSIS — Y999 Unspecified external cause status: Secondary | ICD-10-CM | POA: Insufficient documentation

## 2018-08-02 DIAGNOSIS — Z7984 Long term (current) use of oral hypoglycemic drugs: Secondary | ICD-10-CM | POA: Insufficient documentation

## 2018-08-02 DIAGNOSIS — R55 Syncope and collapse: Secondary | ICD-10-CM | POA: Diagnosis present

## 2018-08-02 DIAGNOSIS — R52 Pain, unspecified: Secondary | ICD-10-CM | POA: Diagnosis not present

## 2018-08-02 LAB — CBC WITH DIFFERENTIAL/PLATELET
Abs Immature Granulocytes: 0.09 10*3/uL — ABNORMAL HIGH (ref 0.00–0.07)
Basophils Absolute: 0 10*3/uL (ref 0.0–0.1)
Basophils Relative: 0 %
EOS ABS: 0.1 10*3/uL (ref 0.0–0.5)
Eosinophils Relative: 0 %
HEMATOCRIT: 39.5 % (ref 36.0–46.0)
HEMOGLOBIN: 12.8 g/dL (ref 12.0–15.0)
IMMATURE GRANULOCYTES: 1 %
LYMPHS ABS: 0.6 10*3/uL — AB (ref 0.7–4.0)
LYMPHS PCT: 5 %
MCH: 30.3 pg (ref 26.0–34.0)
MCHC: 32.4 g/dL (ref 30.0–36.0)
MCV: 93.6 fL (ref 80.0–100.0)
Monocytes Absolute: 0.7 10*3/uL (ref 0.1–1.0)
Monocytes Relative: 6 %
NEUTROS PCT: 88 %
Neutro Abs: 9.8 10*3/uL — ABNORMAL HIGH (ref 1.7–7.7)
Platelets: 275 10*3/uL (ref 150–400)
RBC: 4.22 MIL/uL (ref 3.87–5.11)
RDW: 14 % (ref 11.5–15.5)
WBC: 11.3 10*3/uL — ABNORMAL HIGH (ref 4.0–10.5)
nRBC: 0 % (ref 0.0–0.2)

## 2018-08-02 LAB — COMPREHENSIVE METABOLIC PANEL
ALBUMIN: 3.4 g/dL — AB (ref 3.5–5.0)
ALK PHOS: 125 U/L (ref 38–126)
ALT: 26 U/L (ref 0–44)
ANION GAP: 13 (ref 5–15)
AST: 34 U/L (ref 15–41)
BILIRUBIN TOTAL: 0.6 mg/dL (ref 0.3–1.2)
BUN: 14 mg/dL (ref 8–23)
CALCIUM: 8.6 mg/dL — AB (ref 8.9–10.3)
CO2: 23 mmol/L (ref 22–32)
CREATININE: 1 mg/dL (ref 0.44–1.00)
Chloride: 105 mmol/L (ref 98–111)
GFR calc Af Amer: 56 mL/min — ABNORMAL LOW (ref >60)
GFR calc non Af Amer: 48 mL/min — ABNORMAL LOW (ref >60)
GLUCOSE: 188 mg/dL — AB (ref 70–99)
Potassium: 3.2 mmol/L — ABNORMAL LOW (ref 3.5–5.1)
SODIUM: 141 mmol/L (ref 135–145)
TOTAL PROTEIN: 7 g/dL (ref 6.5–8.1)

## 2018-08-02 LAB — URINALYSIS, COMPLETE (UACMP) WITH MICROSCOPIC
BACTERIA UA: NONE SEEN
BILIRUBIN URINE: NEGATIVE
Glucose, UA: 50 mg/dL — AB
Hgb urine dipstick: NEGATIVE
KETONES UR: 5 mg/dL — AB
Leukocytes, UA: NEGATIVE
Nitrite: NEGATIVE
PH: 5 (ref 5.0–8.0)
Protein, ur: NEGATIVE mg/dL
SPECIFIC GRAVITY, URINE: 1.017 (ref 1.005–1.030)
SQUAMOUS EPITHELIAL / LPF: NONE SEEN (ref 0–5)

## 2018-08-02 LAB — PROTIME-INR
INR: 2.03
PROTHROMBIN TIME: 22.7 s — AB (ref 11.4–15.2)

## 2018-08-02 LAB — GLUCOSE, CAPILLARY: Glucose-Capillary: 187 mg/dL — ABNORMAL HIGH (ref 70–99)

## 2018-08-02 MED ORDER — TRAMADOL HCL 50 MG PO TABS
50.0000 mg | ORAL_TABLET | Freq: Once | ORAL | Status: AC
  Administered 2018-08-02: 50 mg via ORAL
  Filled 2018-08-02: qty 1

## 2018-08-02 MED ORDER — ACETAMINOPHEN 500 MG PO TABS
ORAL_TABLET | ORAL | Status: AC
  Filled 2018-08-02: qty 2

## 2018-08-02 MED ORDER — ACETAMINOPHEN 500 MG PO TABS
1000.0000 mg | ORAL_TABLET | Freq: Once | ORAL | Status: AC
  Administered 2018-08-02: 1000 mg via ORAL

## 2018-08-02 NOTE — ED Provider Notes (Signed)
[facility] Emergency Department Provider Note ____________________________________________   I have reviewed the triage vital signs and the triage nursing note.  HISTORY  Chief Complaint Fall   Historian Patient and her daughter. Patient stays at home with full-time care, daughter provides most of the history  HPI Bianca Mueller is a 84 y.o. female presents from home after daughter was told by caregivers that patient was found on the floor, unwitnessed fall sound like in the bed.  Daughter states that she has had several falls in the past couple of weeks.  She is been evaluated at the primary care doctor's office.  She was diagnosed with a hairline rib fracture within the past couple weeks.  Today she seems to be complaining of mid low back pain.  She walks with a walker, and has been able to bear weight.  No any other concerns for recent medical illness in terms of no vomiting or coughing or diarrhea or abdominal pain or chest pain.  Daughter feels like she is a little confused today but she has been a little confused and somewhat intermittently over the past couple weeks as well.  Pain is mild to moderate at times and is positional or movement related.     Past Medical History:  Diagnosis Date  . Arthritis    "hips" (08/07/2014)  . CAD (coronary artery disease)    a. 1989 s/p MI, TPA, PTCA;  b. 06/2014 NSTEMI/Cath: LM nl, LAD 30-40, D2 90, small, LCX 173m OM1 90, RCA Cs2+ 70-80p, 50-613m90d (? ruptured plaque), EF 55-60%-->Med Rx.  . Chronic diastolic CHF (congestive heart failure) ([place]   a. 07/2014 Echo: EF 60-65%, Gr 1 DD, mild to mod TR, PASP 3758m.  . Diverticulosis   . Elevated LFTs    a. 06/2014 Abd U/S: no GB pathology.  . GMarland KitchenRD (gastroesophageal reflux disease)   . Hx of adenomatous colonic polyps 2010   last colonscopy 2010,Dr EllVira Agarno need for further  colonoscopy given age  . Hypercalcemia   . Hyperlipidemia   . Hypertension    . Myocardial infarction (HCCRingwood989  . Osteoporosis   . PAF/PAFlutter    a. 06/2014->amio and coumadin started.  . Type II diabetes mellitus (HCC[place] . Wears dentures    partial lower  . Zoster    past    Patient Active Problem List   Diagnosis Date Noted  . Fall at home 07/17/2018  . Left-sided chest wall pain 07/17/2018  . Wound of ankle 05/23/2018  . Elevated serum creatinine 03/08/2018  . Hypothyroid 02/28/2018  . Dementia, senile (HCCVerdi5/09/2017  . Polyuria 11/08/2017  . Medicare annual wellness visit, subsequent 10/24/2017  . Insomnia disorder 08/15/2017  . Long term (current) use of anticoagulants 06/26/2017  . At high risk for falls 11/16/2016  . Routine general medical examination at a health care facility 08/03/2016  . Anxiety 11/08/2015  . Gait disorder 09/21/2015  . History of fall 08/24/2015  . Hearing loss 08/24/2015  . Acute on chronic diastolic heart failure (HCCDiggins1/20/2015  . Encounter for therapeutic drug monitoring 08/06/2014  . Chronic diastolic CHF (congestive heart failure) (HCCHos[place] . CAD (coronary artery disease)   . Atrial fibrillation (HCCHarmony . Transaminitis 07/20/2014  . Atrial flutter (HCCGoodrich1/10/2013  . NSTEMI (non-ST elevated myocardial infarction) (HCCPilot Point0/31/2015  . Encounter for Medicare annual wellness exam 08/19/2013  . Skin tear of lower leg without complication, initial encounter 03/16/2013  . Other  screening mammogram 05/17/2012  . Adult failure to thrive 05/17/2011  . DM2 (diabetes mellitus, type 2) ([place]) 02/14/2011  . ANEMIA, VITAMIN B12 DEFICIENCY 09/26/2010  . PEDAL EDEMA 03/16/2010  . [place] ALLERGY 09/06/2009  . Vitamin D deficiency 03/02/2009  . DIVERTICULITIS, COLON 11/12/2008  . COLONIC POLYPS, HX OF 09/02/2008  . HYPERCHOLESTEROLEMIA 12/10/2006  . Essential hypertension 12/10/2006  . Osteoporosis 03/18/2002    Past Surgical History:  Procedure Laterality Date  . CARDIAC CATHETERIZATION  07/22/2014  . CATARACT  EXTRACTION W/PHACO Right 03/18/2018   Procedure: CATARACT EXTRACTION PHACO AND INTRAOCULAR LENS PLACEMENT ([place]) RIGHT DIABETES;  Surgeon: Eulogio Bear, MD;  Location: [place];  Service: Ophthalmology;  Laterality: Right;  Diabetes - oral meds  . CORONARY ANGIOPLASTY  1989   "@ [facility]"  . LEFT HEART CATHETERIZATION WITH CORONARY ANGIOGRAM N/A 07/22/2014   Procedure: LEFT HEART CATHETERIZATION WITH CORONARY ANGIOGRAM;  Surgeon: Sinclair Grooms, MD;  Location[facility];  Service: Cardiovascular;  Laterality: N/A;  . SHOULDER OPEN ROTATOR CUFF REPAIR Right 1980's  . VAGINAL HYSTERECTOMY  1970's?   ovaries intact    Prior to Admission medications   Medication Sig Start Date End Date Taking? Authorizing Provider  ACCU-CHEK FASTCLIX LANCETS MISC Use as instructed to test blood sugar once daily or as needed.  Diagnosis:  E11.9   Non insulin-dependent. 12/08/14   Tower, Wynelle Fanny, MD  ALPRAZolam Duanne Moron) 0.25 MG tablet Take 0.25 mg by mouth as needed. 07/20/17   [provider]  amiodarone (PACERONE) 200 MG tablet Take 200 mg by mouth daily. 11/05/14   [provider]  Blood Glucose Monitoring Suppl (ACCU-CHEK AVIVA PLUS) W/DEVICE KIT Use as instructed to test blood sugar once daily or as needed.  Diagnosis:  E11.9  Non insulin-dependent. 12/08/14   Tower, Wynelle Fanny, MD  busPIRone (BUSPAR) 30 MG tablet Take 1 tablet (30 mg total) by mouth 2 (two) times daily. 03/22/18   Tonia Ghent, MD  cephALEXin (KEFLEX) 500 MG capsule Take 1 capsule (500 mg total) by mouth 3 (three) times daily. Patient not taking: Reported on 07/17/2018 07/11/18   Tonia Ghent, MD  cholecalciferol (VITAMIN D) 1000 UNITS tablet Take 2,000 Units by mouth daily at 12 noon.     [provider]  clonazePAM (KLONOPIN) 0.5 MG tablet Take 1 tablet (0.5 mg total) by mouth at bedtime. 07/25/18   Tower, Wynelle Fanny, MD  cloNIDine (CATAPRES) 0.2 MG tablet TAKE 1 TABLET TWICE DAILY 10/25/17   Tower, Wynelle Fanny,  MD  diphenhydrAMINE (BENADRYL) 25 MG tablet Take 25 mg by mouth at bedtime as needed.    [provider]  docusate sodium 100 MG CAPS Take 100 mg by mouth 2 (two) times daily. 07/29/14   Barrett, Evelene Croon, PA-C  doxazosin (CARDURA) 4 MG tablet Take 1 tablet (4 mg total) by mouth daily. 07/01/18   Tonia Ghent, MD  furosemide (LASIX) 40 MG tablet TAKE 1 TABLET DAILY. AS DIRECTED BY CARDIOLOGIST,  MAY TAKE ADDITIONAL DOSES AS NEEDED 12/24/17   Tower, Roque Lias A, MD  glipiZIDE (GLUCOTROL XL) 2.5 MG 24 hr tablet TAKE 1 TABLET DAILY WITH LUNCH. 03/27/18   Tower, Marne A, MD  glucose blood (ACCU-CHEK AVIVA PLUS) test strip USE AS INSTRUCTED TO CHECK BLOOD SUGAR ONCE DAILY OR AS NEEDED.  Dx code E11.59 12/18/17   Tower, Wynelle Fanny, MD  hydrALAZINE (APRESOLINE) 100 MG tablet TAKE 1 TABLET THREE TIMES DAILY 10/25/17   Tower, Anna A,  MD  levothyroxine (SYNTHROID, LEVOTHROID) 25 MCG tablet TAKE 1 TABLET EVERY DAY BEFORE BREAKFAST 08/24/17   Tower, Wynelle Fanny, MD  lisinopril (PRINIVIL,ZESTRIL) 20 MG tablet TAKE 1 TABLET EVERY DAY 03/27/18   Tower, Wynelle Fanny, MD  nitroGLYCERIN (NITROSTAT) 0.4 MG SL tablet Place 1 tablet (0.4 mg total) under the tongue every 5 (five) minutesas needed for chest pain. If not improved seek medical attention in ER 01/24/18   Tower, Wynelle Fanny, MD  omeprazole (PRILOSEC) 20 MG capsule Take 1 capsule (20 mg total) by mouth daily. 05/06/18   Tower, Wynelle Fanny, MD  sertraline (ZOLOFT) 50 MG tablet Take 1 tablet (50 mg total) by mouth daily. 06/24/18   Tower, Wynelle Fanny, MD  silver sulfADIAZINE (SILVADENE) 1 % cream Apply 1 application topically daily. 07/01/18   Tonia Ghent, MD  sitaGLIPtin (JANUVIA) 100 MG tablet Take 1 tablet (100 mg total) by mouth daily. 06/10/18   Tower, Wynelle Fanny, MD  traMADol (ULTRAM) 50 MG tablet Take 0.5-1 tablets (25-50 mg total) by mouth every 8 (eight) hours as needed for moderate pain or severe pain. With food 07/17/18   Tower, Wynelle Fanny, MD  vitamin B-12 (CYANOCOBALAMIN) 1000  MCG tablet Take 1,000 mcg by mouth every other day.     [provider]  warfarin (COUMADIN) 3 MG tablet Take 1/2 -1 pill daily as directed by coumadin clinic. 05/06/18   Tower, Wynelle Fanny, MD    Allergies  Allergen Reactions  . Alendronate Sodium Other (See Comments)    unknown  . Doxycycline Other (See Comments)    Inc LFT  . Isosorbide Nitrate Other (See Comments)    Doesn't remember reaction  . Lidocaine Nausea Only    Given at Dentist office  . Metformin And Related     Decreases appetite resulting in weight loss  . Metronidazole Nausea And Vomiting    REACTION: sick/ GI  . Nifedipine Other (See Comments)    Doesn't remember reaction    Family History  Problem Relation Age of Onset  . Heart disease Mother        CAD  . Heart disease Father        CAD  . Heart disease Brother        CAD    Social History Social History   Tobacco Use  . Smoking status: Former Smoker    Packs/day: 0.25    Years: 30.00    Pack years: 7.50    Types: Cigarettes  . Smokeless tobacco: Never Used  . Tobacco comment: "quit smoking in the 1980's"  Substance Use Topics  . Alcohol use: No    Alcohol/week: 0.0 standard drinks  . Drug use: No    Review of Systems  Constitutional: Negative for fever. Eyes: Negative for visual changes. ENT: Negative for sore throat. Cardiovascular: Daughter thinks that she might be having intermittent chest pain "spasms "as the patient seems to kind of drawl up in pain a few times since she is been here.  Patient denies chest pain at present. Respiratory: Negative for shortness of breath. Gastrointestinal: Negative for abdominal pain, vomiting and diarrhea. Genitourinary: Negative for dysuria. Musculoskeletal: As of as per HPI for back pain. Skin: Negative for rash. Neurological: Negative for headache.  ____________________________________________   PHYSICAL EXAM:  VITAL SIGNS: ED Triage Vitals  Enc Vitals Group     BP 08/02/18 0613 (!)  172/71     Pulse --      Resp 08/02/18 0613 20  Temp 08/02/18 0613 98.2 F (36.8 C)     Temp src --      SpO2 08/02/18 0613 95 %     Weight 08/02/18 0614 125 lb 10.6 oz (57 kg)     Height --      Head Circumference --      Peak Flow --      Pain Score 08/02/18 0613 4     Pain Loc --      Pain Edu? --      Excl. in [place]? --      Constitutional: Alert and cooperative, poor historian.  HEENT      Head: Normocephalic and atraumatic.      Eyes: Conjunctivae are normal. Pupils equal and round.       Ears:         Nose: No congestion/rhinnorhea.      Mouth/Throat: Mucous membranes are moist.      Neck: No stridor.  Nontender cspine Cardiovascular/Chest: Normal rate, regular rhythm.  No murmurs, rubs, or gallops. Respiratory: Normal respiratory effort without tachypnea nor retractions. Breath sounds are clear and equal bilaterally. No wheezes/rales/rhonchi. Gastrointestinal: Soft. No distention, no guarding, no rebound. Nontender.    Genitourinary/rectal:Deferred Musculoskeletal: Nontender with normal range of motion in all extremities.  No hip pain to palpation range of motion.  Stable pelvis.  No joint effusions.  No lower extremity tenderness.  No edema. Neurologic:  Normal speech and language. No gross or focal neurologic deficits are appreciated. Skin:  Skin is warm, dry and intact. No rash noted. Psychiatric: Mood and affect are normal. Speech and behavior are normal. Patient exhibits appropriate insight and judgment.   ____________________________________________  LABS (pertinent positives/negatives) I, Lisa Roca, MD the attending physician have reviewed the labs noted below.  Labs Reviewed  CBC WITH DIFFERENTIAL/PLATELET - Abnormal; Notable for the following components:      Result Value   WBC 11.3 (*)    Neutro Abs 9.8 (*)    Lymphs Abs 0.6 (*)    Abs Immature Granulocytes 0.09 (*)    All other components within normal limits  GLUCOSE, CAPILLARY - Abnormal;  Notable for the following components:   Glucose-Capillary 187 (*)    All other components within normal limits  COMPREHENSIVE METABOLIC PANEL - Abnormal; Notable for the following components:   Potassium 3.2 (*)    Glucose, Bld 188 (*)    Calcium 8.6 (*)    Albumin 3.4 (*)    GFR calc non Af Amer 48 (*)    GFR calc Af Amer 56 (*)    All other components within normal limits  URINALYSIS, COMPLETE (UACMP) WITH MICROSCOPIC - Abnormal; Notable for the following components:   Color, Urine YELLOW (*)    APPearance CLEAR (*)    Glucose, UA 50 (*)    Ketones, ur 5 (*)    All other components within normal limits  PROTIME-INR - Abnormal; Notable for the following components:   Prothrombin Time 22.7 (*)    All other components within normal limits  CBG MONITORING, ED    ____________________________________________    EKG I, Lisa Roca, MD, the attending physician have personally viewed and interpreted all ECGs.  106 bpm.  Irregularly irregular rhythm consistent with either sinus tachycardia with occasional PAC versus possible A. fib..  Narrow QRS. ____________________________________________  RADIOLOGY   CT head without contrast, radiologist report:IMPRESSION: Atrophy with extensive supratentorial small vessel disease, stable. No acute infarct evident. No mass or hemorrhage.  Foci of arterial  vascular calcification noted. Areas of paranasal sinus disease, most notably in the right sphenoid sinus.  CXR:  IMPRESSION: 1. No active cardiopulmonary disease. No evidence of pneumonia or pulmonary edema. 2. Stable cardiomegaly. 3. Mild wedge compression deformities of several midthoracic vertebral bodies, of uncertain age. These are not clearly seen on previous the chest x-ray of 10/31/2015 and may be acute (or acute on chronic) compression fractures.  Lumbar spine xray:  IMPRESSION: 1. No acute findings.  No osseous fracture or dislocation seen. 2. Degenerative spondylosis of  the lumbar spine, as detailed above. 3. Aortic atherosclerosis.  Thoracic spine xray:  IMPRESSION: 1. Mild wedge compression deformity of several midthoracic vertebral bodies, of uncertain age, but 2 of which are at least mildly worsened compared to the earlier chest CT of 04/14/2016, possibly acute on chronic compression fractures. No evidence of a significant vertebral body retropulsion. 2. No fracture line or displaced fracture fragment seen. __________________________________________  PROCEDURES  Procedure(s) performed: None  Procedures  Critical Care performed: None   ____________________________________________  ED COURSE / ASSESSMENT AND PLAN  Pertinent labs & imaging results that were available during my care of the patient were reviewed by me and considered in my medical decision making (see chart for details).    From a traumatic perspective, CT head negative.  C-spine without tenderness.  Will x-ray chest because of recent rib fracture and concern about possible chest discomfort although she is in like her pain is actually coming from her back.  No abdominal pain.  Laboratory studies are overall reassuring.  She does have a slightly elevated white blood cell count, and we discussed obtaining urine given some confusion which might be dementia related, but could be sign of infection.  Urinalysis negative.  I reviewed the x-rays, there are at least 3 midthoracic mild compression fractures, questionably new per radiologist.  I did discuss results with patient and daughter.   CONSULTATIONS:   None   Patient / Family / Caregiver informed of clinical course, medical decision-making process, and agree with plan.   I discussed return precautions, follow-up instructions, and discharge instructions with patient and/or family.  Discharge Instructions : Exam and evaluation overall reassuring in the emergency department today.  There are at least 3 levels of the spinal  column showing compression fracture which probably are new although there is some indicators that these may be older.  You may treat pain as needed with over-the-counter Tylenol and/or ibuprofen, use as directed on labeling.  Return to the emergency department immediately for any worsening condition including confusion altered mental status, weakness, numbness, slurred speech, fever, chest pain, numbness, weakness, or any other symptoms concerning to you.    ___________________________________________   FINAL CLINICAL IMPRESSION(S) / ED DIAGNOSES   Final diagnoses:  Aortic atherosclerosis (HCC)  Thoracic compression fracture, closed, initial encounter [facility])  Fall in home, initial encounter      ___________________________________________         Note: This dictation was prepared with Dragon dictation. Any transcriptional errors that result from this process are unintentional    Lisa Roca, MD 08/02/18 [id number]

## 2018-08-02 NOTE — Discharge Instructions (Addendum)
Exam and evaluation overall reassuring in the emergency department today.  There are at least 3 levels of the spinal column showing compression fracture which probably are new although there is some indicators that these may be older.  You may treat pain as needed with over-the-counter Tylenol and/or ibuprofen, use as directed on labeling.  Return to the emergency department immediately for any worsening condition including confusion altered mental status, weakness, numbness, slurred speech, fever, chest pain, numbness, weakness, or any other symptoms concerning to you.

## 2018-08-02 NOTE — ED Notes (Signed)
Patient unable to provide this RN with birthday when verifying label for blood specimen. Patient had previously been able to correctly articulate her birthday. Patient's daughter reports that this is abnormal for her mother. MD Owens Shark informed.

## 2018-08-02 NOTE — ED Notes (Signed)
Patient transported to CT 

## 2018-08-02 NOTE — ED Notes (Signed)
Family (daughter/POA) at bedside

## 2018-08-02 NOTE — ED Triage Notes (Addendum)
Patient coming [facility] From home for unwitnessed fall. Patient found on floor by caregiver and placed back into bed. Patient has 2 left broken ribs from previous fall 2 weeks ago.   EMS vitals: CBG 182, 94% on RA.   Patient c/o mid back pain post fall.   Patient has hx diabetes and afib.   Patient is on Warfarin.

## 2018-08-03 ENCOUNTER — Other Ambulatory Visit: Payer: Self-pay | Admitting: Family Medicine

## 2018-08-03 DIAGNOSIS — I10 Essential (primary) hypertension: Secondary | ICD-10-CM

## 2018-08-08 ENCOUNTER — Encounter: Payer: Self-pay | Admitting: Internal Medicine

## 2018-08-08 ENCOUNTER — Ambulatory Visit (INDEPENDENT_AMBULATORY_CARE_PROVIDER_SITE_OTHER): Payer: Medicare HMO | Admitting: Internal Medicine

## 2018-08-08 ENCOUNTER — Ambulatory Visit (INDEPENDENT_AMBULATORY_CARE_PROVIDER_SITE_OTHER)
Admission: RE | Admit: 2018-08-08 | Discharge: 2018-08-08 | Disposition: A | Discharge status: Home / Self Care | Payer: Medicare HMO | Source: Ambulatory Visit | Attending: Internal Medicine | Admitting: Internal Medicine

## 2018-08-08 ENCOUNTER — Ambulatory Visit (INDEPENDENT_AMBULATORY_CARE_PROVIDER_SITE_OTHER): Payer: Medicare HMO | Admitting: General Practice

## 2018-08-08 VITALS — BP 128/80 | HR 87 | Temp 99.2°F | Ht 61.0 in

## 2018-08-08 DIAGNOSIS — R296 Repeated falls: Secondary | ICD-10-CM | POA: Insufficient documentation

## 2018-08-08 DIAGNOSIS — I4891 Unspecified atrial fibrillation: Secondary | ICD-10-CM

## 2018-08-08 DIAGNOSIS — R0602 Shortness of breath: Secondary | ICD-10-CM | POA: Diagnosis not present

## 2018-08-08 DIAGNOSIS — R058 Other specified cough: Secondary | ICD-10-CM

## 2018-08-08 DIAGNOSIS — R05 Cough: Secondary | ICD-10-CM | POA: Insufficient documentation

## 2018-08-08 DIAGNOSIS — Z7901 Long term (current) use of anticoagulants: Secondary | ICD-10-CM

## 2018-08-08 DIAGNOSIS — J181 Lobar pneumonia, unspecified organism: Secondary | ICD-10-CM | POA: Diagnosis not present

## 2018-08-08 LAB — POCT INR: INR: 2.9 (ref 2.0–3.0)

## 2018-08-08 MED ORDER — AZITHROMYCIN 250 MG PO TABS: ORAL_TABLET | ORAL | 0 refills | Status: DC

## 2018-08-08 MED ORDER — CEFTRIAXONE SODIUM 1 G IJ SOLR
1.0000 g | Freq: Once | INTRAMUSCULAR | Status: AC
  Administered 2018-08-08: 1 g via INTRAMUSCULAR

## 2018-08-08 NOTE — Assessment & Plan Note (Signed)
Not clear if falls could be related to current illness or other change Will leave for Dr Glori Bickers to decide whether home PT should be tried Will set up follow up in 1-2 weeks to review status

## 2018-08-08 NOTE — Assessment & Plan Note (Signed)
Symptoms and exam consistent with pneumonia Will check x-ray

## 2018-08-08 NOTE — Addendum Note (Signed)
Addended by: Pilar Grammes on: 08/08/2018 05:48 PM   Modules accepted: Orders

## 2018-08-08 NOTE — Patient Instructions (Addendum)
Pre visit review using our clinic review tool, if applicable. No additional management support is needed unless otherwise documented below in the visit note.  Continue to take 1 tablet daily except nothing on Mondays and Fridays.  Re-check in 4 weeks.  Daughter, Lugene notified of results and instructions for recheck.

## 2018-08-08 NOTE — Assessment & Plan Note (Addendum)
Reviewed CXR and had radiologist read Does have RML infiltrate that was seen at the ER exam also (but not on past exams) This is clinically relevant  I will give rocephin z-pak as well INR 2.9 today----she skips Monday and Friday, so probably can get by without a dose change  Should have follow up CXR also

## 2018-08-08 NOTE — Progress Notes (Signed)
Subjective:    Patient ID: Bianca Mueller, female    DOB: 1926-03-07, 84 y.o.   MRN: [id number]  HPI Here with SIL due to cough  [address] [address] after getting out of bed 1-2 weeks ago [place] [address] again after that Scraped up right calf---healing up Injured ribs and bruised back  Having a lot of cough and it is productive (yellow) Started after the falls No fever Having DOE--if she walks No chills or sweats  Current Outpatient Medications on File Prior to Visit  Medication Sig Dispense Refill  . ACCU-CHEK FASTCLIX LANCETS MISC Use as instructed to test blood sugar once daily or as needed.  Diagnosis:  E11.9   Non insulin-dependent. 102 each 4  . ALPRAZolam (XANAX) 0.25 MG tablet Take 0.25 mg by mouth as needed.    Marland Kitchen amiodarone (PACERONE) 200 MG tablet Take 200 mg by mouth daily.    . Blood Glucose Monitoring Suppl (ACCU-CHEK AVIVA PLUS) W/DEVICE KIT Use as instructed to test blood sugar once daily or as needed.  Diagnosis:  E11.9  Non insulin-dependent. 1 kit 0  . busPIRone (BUSPAR) 30 MG tablet Take 1 tablet (30 mg total) by mouth 2 (two) times daily. 180 tablet 1  . cholecalciferol (VITAMIN D) 1000 UNITS tablet Take 2,000 Units by mouth daily at 12 noon.     . clonazePAM (KLONOPIN) 0.5 MG tablet Take 1 tablet (0.5 mg total) by mouth at bedtime. 90 tablet 0  . cloNIDine (CATAPRES) 0.2 MG tablet TAKE 1 TABLET TWICE DAILY 180 tablet 3  . diphenhydrAMINE (BENADRYL) 25 MG tablet Take 25 mg by mouth at bedtime as needed.    . docusate sodium 100 MG CAPS Take 100 mg by mouth 2 (two) times daily. 10 capsule 0  . doxazosin (CARDURA) 4 MG tablet Take 1 tablet (4 mg total) by mouth daily.    . furosemide (LASIX) 40 MG tablet TAKE 1 TABLET DAILY. AS DIRECTED BY CARDIOLOGIST,  MAY TAKE ADDITIONAL DOSES AS NEEDED 180 tablet 2  . glipiZIDE (GLUCOTROL XL) 2.5 MG 24 hr tablet TAKE 1 TABLET DAILY WITH LUNCH. 90 tablet 1  . glucose blood (ACCU-CHEK AVIVA PLUS) test strip USE AS INSTRUCTED TO CHECK BLOOD SUGAR ONCE  DAILY OR AS NEEDED.  Dx code E11.59 100 each 3  . hydrALAZINE (APRESOLINE) 100 MG tablet TAKE 1 TABLET THREE TIMES DAILY 270 tablet 3  . levothyroxine (SYNTHROID, LEVOTHROID) 25 MCG tablet TAKE 1 TABLET EVERY DAY BEFORE BREAKFAST 90 tablet 3  . lisinopril (PRINIVIL,ZESTRIL) 20 MG tablet TAKE 1 TABLET EVERY DAY 90 tablet 1  . nitroGLYCERIN (NITROSTAT) 0.4 MG SL tablet Place 1 tablet (0.4 mg total) under the tongue every 5 (five) minutesas needed for chest pain. If not improved seek medical attention in ER 25 tablet 1  . omeprazole (PRILOSEC) 20 MG capsule Take 1 capsule (20 mg total) by mouth daily. 90 capsule 3  . sertraline (ZOLOFT) 50 MG tablet Take 1 tablet (50 mg total) by mouth daily. 90 tablet 3  . silver sulfADIAZINE (SILVADENE) 1 % cream Apply 1 application topically daily. 50 g 0  . sitaGLIPtin (JANUVIA) 100 MG tablet Take 1 tablet (100 mg total) by mouth daily. 90 tablet 1  . traMADol (ULTRAM) 50 MG tablet Take 0.5-1 tablets (25-50 mg total) by mouth every 8 (eight) hours as needed for moderate pain or severe pain. With food 30 tablet 1  . vitamin B-12 (CYANOCOBALAMIN) 1000 MCG tablet Take 1,000 mcg by mouth every other day.     Marland Kitchen  warfarin (COUMADIN) 3 MG tablet Take 1/2 -1 pill daily as directed by coumadin clinic. 70 tablet 1   No current facility-administered medications on file prior to visit.     Allergies  Allergen Reactions  . Alendronate Sodium Other (See Comments)    unknown  . Doxycycline Other (See Comments)    Inc LFT  . Isosorbide Nitrate Other (See Comments)    Doesn't remember reaction  . Lidocaine Nausea Only    Given at Dentist office  . Metformin And Related     Decreases appetite resulting in weight loss  . Metronidazole Nausea And Vomiting    REACTION: sick/ GI  . Nifedipine Other (See Comments)    Doesn't remember reaction    Past Medical History:  Diagnosis Date  . Arthritis    "hips" (08/07/2014)  . CAD (coronary artery disease)    a. 1989 s/p  MI, TPA, PTCA;  b. 06/2014 NSTEMI/Cath: LM nl, LAD 30-40, D2 90, small, LCX 123m OM1 90, RCA Cs2+ 70-80p, 50-642m90d (? ruptured plaque), EF 55-60%-->Med Rx.  . Chronic diastolic CHF (congestive heart failure) ([place]   a. 07/2014 Echo: EF 60-65%, Gr 1 DD, mild to mod TR, PASP 3767m.  . Diverticulosis   . Elevated LFTs    a. 06/2014 Abd U/S: no GB pathology.  . GMarland KitchenRD (gastroesophageal reflux disease)   . Hx of adenomatous colonic polyps 2010   last colonscopy 2010,Dr EllVira Agarno need for further  colonoscopy given age  . Hypercalcemia   . Hyperlipidemia   . Hypertension   . Myocardial infarction ([facility]  . Osteoporosis   . PAF/PAFlutter    a. 06/2014->amio and coumadin started.  . Type II diabetes mellitus ([place] . Wears dentures    partial lower  . Zoster    past    Past Surgical History:  Procedure Laterality Date  . CARDIAC CATHETERIZATION  07/22/2014  . CATARACT EXTRACTION W/PHACO Right 03/18/2018   Procedure: CATARACT EXTRACTION PHACO AND INTRAOCULAR LENS PLACEMENT ([place] DI[place]ET[place];  Surgeon: KinEulogio BearD;  [facility];  Laterality: Right;  Diabetes - oral meds  . CORONARY ANGIOPLASTY  1989   "@ [facility]"  . LEFT HEART CATHETERIZATION WITH CORONARY ANGIOGRAM N/A 07/22/2014   Procedure: LEFT HEART CATHETERIZATION WITH CORONARY ANGIOGRAM;  Surgeon: HenSinclair GroomsD;  [facility];  Service: Cardiovascular;  Laterality: N/A;  . SHOULDER OPEN ROTATOR CUFF REPAIR Right 1980's  . VAGINAL HYSTERECTOMY  1970's?   ovaries intact    Family History  Problem Relation Age of Onset  . Heart disease Mother        CAD  . Heart disease Father        CAD  . Heart disease Brother        CAD    Social History   Socioeconomic History  . Marital status: Married    Spouse name: Not on file  . Number of children: Not on file  . Years of education: Not on file  . Highest education level: Not on file  Occupational  History  . Not on file  Social Needs  . Financial resource strain: Not on file  . Food insecurity:    Worry: Not on file    Inability: Not on file  . Transportation needs:    Medical: Not on file    Non-medical: Not on file  Tobacco Use  . Smoking status: Former Smoker  Packs/day: 0.25    Years: 30.00    Pack years: 7.50    Types: Cigarettes  . Smokeless tobacco: Never Used  . Tobacco comment: "quit smoking in the 1980's"  Substance and Sexual Activity  . Alcohol use: No    Alcohol/week: 0.0 standard drinks  . Drug use: No  . Sexual activity: Never  Lifestyle  . Physical activity:    Days per week: Not on file    Minutes per session: Not on file  . Stress: Not on file  Relationships  . Social connections:    Talks on phone: Not on file    Gets together: Not on file    Attends religious service: Not on file    Active member of club or organization: Not on file    Attends meetings of clubs or organizations: Not on file    Relationship status: Not on file  . Intimate partner violence:    Fear of current or ex partner: Not on file    Emotionally abused: Not on file    Physically abused: Not on file    Forced sexual activity: Not on file  Other Topics Concern  . Not on file  Social History Narrative  . Not on file   Review of Systems Some nausea but not vomiting Appetite is off Has needed assist walking since the falls----walker before that    Objective:   Physical Exam  Constitutional: No distress.  HENT:  Mouth/Throat: Oropharynx is clear and moist. No oropharyngeal exudate.  Neck: No thyromegaly present.  Cardiovascular: Normal rate, regular rhythm and normal heart sounds. Exam reveals no gallop and no friction rub.  No murmur heard. Respiratory:  Crackles in RLL Clear otherwise No wheezing  Musculoskeletal:  Some bruising left lumbar back  Lymphadenopathy:    She has no cervical adenopathy.           Assessment & Plan:

## 2018-08-19 ENCOUNTER — Encounter: Payer: Self-pay | Admitting: Family Medicine

## 2018-08-19 DIAGNOSIS — Z9181 History of falling: Secondary | ICD-10-CM

## 2018-08-19 DIAGNOSIS — J181 Lobar pneumonia, unspecified organism: Secondary | ICD-10-CM

## 2018-08-19 DIAGNOSIS — R269 Unspecified abnormalities of gait and mobility: Secondary | ICD-10-CM

## 2018-08-19 DIAGNOSIS — H9193 Unspecified hearing loss, bilateral: Secondary | ICD-10-CM

## 2018-08-19 DIAGNOSIS — F0390 Unspecified dementia without behavioral disturbance: Secondary | ICD-10-CM

## 2018-08-19 DIAGNOSIS — R296 Repeated falls: Secondary | ICD-10-CM

## 2018-08-20 NOTE — Telephone Encounter (Signed)
Order done Will route to [facility] 

## 2018-08-22 ENCOUNTER — Telehealth: Payer: Self-pay | Admitting: Family Medicine

## 2018-08-22 DIAGNOSIS — I110 Hypertensive heart disease with heart failure: Secondary | ICD-10-CM | POA: Diagnosis not present

## 2018-08-22 DIAGNOSIS — M545 Low back pain: Secondary | ICD-10-CM | POA: Diagnosis not present

## 2018-08-22 DIAGNOSIS — R296 Repeated falls: Secondary | ICD-10-CM | POA: Diagnosis not present

## 2018-08-22 DIAGNOSIS — I2510 Atherosclerotic heart disease of native coronary artery without angina pectoris: Secondary | ICD-10-CM | POA: Diagnosis not present

## 2018-08-22 DIAGNOSIS — J181 Lobar pneumonia, unspecified organism: Secondary | ICD-10-CM | POA: Diagnosis not present

## 2018-08-22 DIAGNOSIS — E119 Type 2 diabetes mellitus without complications: Secondary | ICD-10-CM | POA: Diagnosis not present

## 2018-08-22 DIAGNOSIS — Z9181 History of falling: Secondary | ICD-10-CM | POA: Diagnosis not present

## 2018-08-22 DIAGNOSIS — I4891 Unspecified atrial fibrillation: Secondary | ICD-10-CM | POA: Diagnosis not present

## 2018-08-22 DIAGNOSIS — I5032 Chronic diastolic (congestive) heart failure: Secondary | ICD-10-CM | POA: Diagnosis not present

## 2018-08-22 NOTE — Telephone Encounter (Signed)
Please ok those verbal orders  

## 2018-08-22 NOTE — Telephone Encounter (Signed)
Need verbal order for PT for pt  2week 2 1week 1 2week 2

## 2018-08-23 ENCOUNTER — Telehealth: Payer: Self-pay

## 2018-08-23 DIAGNOSIS — M545 Low back pain: Secondary | ICD-10-CM | POA: Diagnosis not present

## 2018-08-23 DIAGNOSIS — I110 Hypertensive heart disease with heart failure: Secondary | ICD-10-CM | POA: Diagnosis not present

## 2018-08-23 DIAGNOSIS — I4891 Unspecified atrial fibrillation: Secondary | ICD-10-CM | POA: Diagnosis not present

## 2018-08-23 DIAGNOSIS — Z9181 History of falling: Secondary | ICD-10-CM | POA: Diagnosis not present

## 2018-08-23 DIAGNOSIS — I2510 Atherosclerotic heart disease of native coronary artery without angina pectoris: Secondary | ICD-10-CM | POA: Diagnosis not present

## 2018-08-23 DIAGNOSIS — J181 Lobar pneumonia, unspecified organism: Secondary | ICD-10-CM | POA: Diagnosis not present

## 2018-08-23 DIAGNOSIS — R296 Repeated falls: Secondary | ICD-10-CM | POA: Diagnosis not present

## 2018-08-23 DIAGNOSIS — I5032 Chronic diastolic (congestive) heart failure: Secondary | ICD-10-CM | POA: Diagnosis not present

## 2018-08-23 DIAGNOSIS — E119 Type 2 diabetes mellitus without complications: Secondary | ICD-10-CM | POA: Diagnosis not present

## 2018-08-23 NOTE — Telephone Encounter (Signed)
Verbal order given also she requested a Speech therapy Eval given pt's trouble with word finding

## 2018-08-23 NOTE — Telephone Encounter (Signed)
Amy speech therapist with [facility] requesting verbal orders for [facility] speech therapy 1 x a wk for 1 week and 2 x a week for 2 weeks.

## 2018-08-23 NOTE — Telephone Encounter (Signed)
Verbal order given to Amy 

## 2018-08-23 NOTE — Telephone Encounter (Signed)
Please verbally ok that order  

## 2018-08-25 ENCOUNTER — Other Ambulatory Visit: Payer: Self-pay | Admitting: Family Medicine

## 2018-08-26 DIAGNOSIS — Z9181 History of falling: Secondary | ICD-10-CM | POA: Diagnosis not present

## 2018-08-26 DIAGNOSIS — M545 Low back pain: Secondary | ICD-10-CM | POA: Diagnosis not present

## 2018-08-26 DIAGNOSIS — R296 Repeated falls: Secondary | ICD-10-CM | POA: Diagnosis not present

## 2018-08-26 DIAGNOSIS — I110 Hypertensive heart disease with heart failure: Secondary | ICD-10-CM | POA: Diagnosis not present

## 2018-08-26 DIAGNOSIS — E119 Type 2 diabetes mellitus without complications: Secondary | ICD-10-CM | POA: Diagnosis not present

## 2018-08-26 DIAGNOSIS — J181 Lobar pneumonia, unspecified organism: Secondary | ICD-10-CM | POA: Diagnosis not present

## 2018-08-26 DIAGNOSIS — I4891 Unspecified atrial fibrillation: Secondary | ICD-10-CM | POA: Diagnosis not present

## 2018-08-26 DIAGNOSIS — I5032 Chronic diastolic (congestive) heart failure: Secondary | ICD-10-CM | POA: Diagnosis not present

## 2018-08-26 DIAGNOSIS — I2510 Atherosclerotic heart disease of native coronary artery without angina pectoris: Secondary | ICD-10-CM | POA: Diagnosis not present

## 2018-08-27 DIAGNOSIS — I4891 Unspecified atrial fibrillation: Secondary | ICD-10-CM | POA: Diagnosis not present

## 2018-08-27 DIAGNOSIS — R296 Repeated falls: Secondary | ICD-10-CM | POA: Diagnosis not present

## 2018-08-27 DIAGNOSIS — E119 Type 2 diabetes mellitus without complications: Secondary | ICD-10-CM | POA: Diagnosis not present

## 2018-08-27 DIAGNOSIS — I2510 Atherosclerotic heart disease of native coronary artery without angina pectoris: Secondary | ICD-10-CM | POA: Diagnosis not present

## 2018-08-27 DIAGNOSIS — I5032 Chronic diastolic (congestive) heart failure: Secondary | ICD-10-CM | POA: Diagnosis not present

## 2018-08-27 DIAGNOSIS — M545 Low back pain: Secondary | ICD-10-CM | POA: Diagnosis not present

## 2018-08-27 DIAGNOSIS — I110 Hypertensive heart disease with heart failure: Secondary | ICD-10-CM | POA: Diagnosis not present

## 2018-08-27 DIAGNOSIS — Z9181 History of falling: Secondary | ICD-10-CM | POA: Diagnosis not present

## 2018-08-27 DIAGNOSIS — J181 Lobar pneumonia, unspecified organism: Secondary | ICD-10-CM | POA: Diagnosis not present

## 2018-08-29 ENCOUNTER — Telehealth: Payer: Self-pay | Admitting: *Deleted

## 2018-08-29 DIAGNOSIS — E119 Type 2 diabetes mellitus without complications: Secondary | ICD-10-CM | POA: Diagnosis not present

## 2018-08-29 DIAGNOSIS — I5032 Chronic diastolic (congestive) heart failure: Secondary | ICD-10-CM | POA: Diagnosis not present

## 2018-08-29 DIAGNOSIS — Z9181 History of falling: Secondary | ICD-10-CM | POA: Diagnosis not present

## 2018-08-29 DIAGNOSIS — J181 Lobar pneumonia, unspecified organism: Secondary | ICD-10-CM | POA: Diagnosis not present

## 2018-08-29 DIAGNOSIS — I2510 Atherosclerotic heart disease of native coronary artery without angina pectoris: Secondary | ICD-10-CM | POA: Diagnosis not present

## 2018-08-29 DIAGNOSIS — I4891 Unspecified atrial fibrillation: Secondary | ICD-10-CM | POA: Diagnosis not present

## 2018-08-29 DIAGNOSIS — M545 Low back pain: Secondary | ICD-10-CM | POA: Diagnosis not present

## 2018-08-29 DIAGNOSIS — R296 Repeated falls: Secondary | ICD-10-CM | POA: Diagnosis not present

## 2018-08-29 DIAGNOSIS — I110 Hypertensive heart disease with heart failure: Secondary | ICD-10-CM | POA: Diagnosis not present

## 2018-08-29 NOTE — Telephone Encounter (Signed)
Thanks for letting me know - keep me posted if any signs of injury

## 2018-08-29 NOTE — Telephone Encounter (Signed)
Amy, speech therapist with [facility] left message at triage vm, states per pt, husband and caregiver, pt fell today appx noon. She was using her rollator, on her way to the sofa, and tripped on her husbands catheter, which is connected to the recliner. She denies any injuries, but dropped to her knees, holding on. The caregiver assisted her back to the sofa, and she had no additional complaints.

## 2018-08-30 DIAGNOSIS — I4891 Unspecified atrial fibrillation: Secondary | ICD-10-CM | POA: Diagnosis not present

## 2018-08-30 DIAGNOSIS — I110 Hypertensive heart disease with heart failure: Secondary | ICD-10-CM | POA: Diagnosis not present

## 2018-08-30 DIAGNOSIS — E119 Type 2 diabetes mellitus without complications: Secondary | ICD-10-CM | POA: Diagnosis not present

## 2018-08-30 DIAGNOSIS — M545 Low back pain: Secondary | ICD-10-CM | POA: Diagnosis not present

## 2018-08-30 DIAGNOSIS — R296 Repeated falls: Secondary | ICD-10-CM | POA: Diagnosis not present

## 2018-08-30 DIAGNOSIS — I2510 Atherosclerotic heart disease of native coronary artery without angina pectoris: Secondary | ICD-10-CM | POA: Diagnosis not present

## 2018-08-30 DIAGNOSIS — Z9181 History of falling: Secondary | ICD-10-CM | POA: Diagnosis not present

## 2018-08-30 DIAGNOSIS — I5032 Chronic diastolic (congestive) heart failure: Secondary | ICD-10-CM | POA: Diagnosis not present

## 2018-08-30 DIAGNOSIS — J181 Lobar pneumonia, unspecified organism: Secondary | ICD-10-CM | POA: Diagnosis not present

## 2018-09-02 DIAGNOSIS — Z79899 Other long term (current) drug therapy: Secondary | ICD-10-CM

## 2018-09-02 DIAGNOSIS — I4891 Unspecified atrial fibrillation: Secondary | ICD-10-CM | POA: Diagnosis not present

## 2018-09-02 DIAGNOSIS — I110 Hypertensive heart disease with heart failure: Secondary | ICD-10-CM | POA: Diagnosis not present

## 2018-09-02 DIAGNOSIS — Z7901 Long term (current) use of anticoagulants: Secondary | ICD-10-CM

## 2018-09-02 DIAGNOSIS — M545 Low back pain: Secondary | ICD-10-CM | POA: Diagnosis not present

## 2018-09-02 DIAGNOSIS — I5032 Chronic diastolic (congestive) heart failure: Secondary | ICD-10-CM | POA: Diagnosis not present

## 2018-09-02 DIAGNOSIS — E119 Type 2 diabetes mellitus without complications: Secondary | ICD-10-CM | POA: Diagnosis not present

## 2018-09-02 DIAGNOSIS — Z9181 History of falling: Secondary | ICD-10-CM | POA: Diagnosis not present

## 2018-09-02 DIAGNOSIS — Z87891 Personal history of nicotine dependence: Secondary | ICD-10-CM

## 2018-09-02 DIAGNOSIS — J181 Lobar pneumonia, unspecified organism: Secondary | ICD-10-CM | POA: Diagnosis not present

## 2018-09-02 DIAGNOSIS — I2510 Atherosclerotic heart disease of native coronary artery without angina pectoris: Secondary | ICD-10-CM | POA: Diagnosis not present

## 2018-09-02 DIAGNOSIS — F0390 Unspecified dementia without behavioral disturbance: Secondary | ICD-10-CM

## 2018-09-02 DIAGNOSIS — R296 Repeated falls: Secondary | ICD-10-CM | POA: Diagnosis not present

## 2018-09-04 DIAGNOSIS — I110 Hypertensive heart disease with heart failure: Secondary | ICD-10-CM | POA: Diagnosis not present

## 2018-09-04 DIAGNOSIS — J181 Lobar pneumonia, unspecified organism: Secondary | ICD-10-CM | POA: Diagnosis not present

## 2018-09-04 DIAGNOSIS — M545 Low back pain: Secondary | ICD-10-CM | POA: Diagnosis not present

## 2018-09-04 DIAGNOSIS — E119 Type 2 diabetes mellitus without complications: Secondary | ICD-10-CM | POA: Diagnosis not present

## 2018-09-04 DIAGNOSIS — I4891 Unspecified atrial fibrillation: Secondary | ICD-10-CM | POA: Diagnosis not present

## 2018-09-04 DIAGNOSIS — I2510 Atherosclerotic heart disease of native coronary artery without angina pectoris: Secondary | ICD-10-CM | POA: Diagnosis not present

## 2018-09-04 DIAGNOSIS — Z9181 History of falling: Secondary | ICD-10-CM | POA: Diagnosis not present

## 2018-09-04 DIAGNOSIS — I5032 Chronic diastolic (congestive) heart failure: Secondary | ICD-10-CM | POA: Diagnosis not present

## 2018-09-04 DIAGNOSIS — R296 Repeated falls: Secondary | ICD-10-CM | POA: Diagnosis not present

## 2018-09-05 ENCOUNTER — Ambulatory Visit: Payer: Medicare HMO

## 2018-09-05 ENCOUNTER — Ambulatory Visit (INDEPENDENT_AMBULATORY_CARE_PROVIDER_SITE_OTHER): Payer: Medicare HMO | Admitting: General Practice

## 2018-09-05 DIAGNOSIS — Z7901 Long term (current) use of anticoagulants: Secondary | ICD-10-CM

## 2018-09-05 DIAGNOSIS — I4891 Unspecified atrial fibrillation: Secondary | ICD-10-CM

## 2018-09-05 LAB — POCT INR: INR: 1.9 — AB (ref 2.0–3.0)

## 2018-09-05 NOTE — Patient Instructions (Addendum)
Pre visit review using our clinic review tool, if applicable. No additional management support is needed unless otherwise documented below in the visit note.  Take 1 1/2 tablets (4.5 mg) today, 12/19) and then continue to take 1 tablet daily except nothing on Mondays and Fridays.  Re-check in 4 weeks.  Daughter, Lugene notified of results and instructions for recheck.

## 2018-09-06 DIAGNOSIS — E119 Type 2 diabetes mellitus without complications: Secondary | ICD-10-CM | POA: Diagnosis not present

## 2018-09-06 DIAGNOSIS — I4891 Unspecified atrial fibrillation: Secondary | ICD-10-CM | POA: Diagnosis not present

## 2018-09-06 DIAGNOSIS — R296 Repeated falls: Secondary | ICD-10-CM | POA: Diagnosis not present

## 2018-09-06 DIAGNOSIS — I110 Hypertensive heart disease with heart failure: Secondary | ICD-10-CM | POA: Diagnosis not present

## 2018-09-06 DIAGNOSIS — J181 Lobar pneumonia, unspecified organism: Secondary | ICD-10-CM | POA: Diagnosis not present

## 2018-09-06 DIAGNOSIS — M545 Low back pain: Secondary | ICD-10-CM | POA: Diagnosis not present

## 2018-09-06 DIAGNOSIS — Z9181 History of falling: Secondary | ICD-10-CM | POA: Diagnosis not present

## 2018-09-06 DIAGNOSIS — I2510 Atherosclerotic heart disease of native coronary artery without angina pectoris: Secondary | ICD-10-CM | POA: Diagnosis not present

## 2018-09-06 DIAGNOSIS — I5032 Chronic diastolic (congestive) heart failure: Secondary | ICD-10-CM | POA: Diagnosis not present

## 2018-09-13 DIAGNOSIS — I5032 Chronic diastolic (congestive) heart failure: Secondary | ICD-10-CM | POA: Diagnosis not present

## 2018-09-13 DIAGNOSIS — E119 Type 2 diabetes mellitus without complications: Secondary | ICD-10-CM | POA: Diagnosis not present

## 2018-09-13 DIAGNOSIS — I2510 Atherosclerotic heart disease of native coronary artery without angina pectoris: Secondary | ICD-10-CM | POA: Diagnosis not present

## 2018-09-13 DIAGNOSIS — I110 Hypertensive heart disease with heart failure: Secondary | ICD-10-CM | POA: Diagnosis not present

## 2018-09-13 DIAGNOSIS — M545 Low back pain: Secondary | ICD-10-CM | POA: Diagnosis not present

## 2018-09-13 DIAGNOSIS — J181 Lobar pneumonia, unspecified organism: Secondary | ICD-10-CM | POA: Diagnosis not present

## 2018-09-13 DIAGNOSIS — Z9181 History of falling: Secondary | ICD-10-CM | POA: Diagnosis not present

## 2018-09-13 DIAGNOSIS — I4891 Unspecified atrial fibrillation: Secondary | ICD-10-CM | POA: Diagnosis not present

## 2018-09-13 DIAGNOSIS — R296 Repeated falls: Secondary | ICD-10-CM | POA: Diagnosis not present

## 2018-09-19 DIAGNOSIS — E119 Type 2 diabetes mellitus without complications: Secondary | ICD-10-CM | POA: Diagnosis not present

## 2018-09-19 DIAGNOSIS — I4891 Unspecified atrial fibrillation: Secondary | ICD-10-CM | POA: Diagnosis not present

## 2018-09-19 DIAGNOSIS — R296 Repeated falls: Secondary | ICD-10-CM | POA: Diagnosis not present

## 2018-09-19 DIAGNOSIS — I5032 Chronic diastolic (congestive) heart failure: Secondary | ICD-10-CM | POA: Diagnosis not present

## 2018-09-19 DIAGNOSIS — I110 Hypertensive heart disease with heart failure: Secondary | ICD-10-CM | POA: Diagnosis not present

## 2018-09-19 DIAGNOSIS — J181 Lobar pneumonia, unspecified organism: Secondary | ICD-10-CM | POA: Diagnosis not present

## 2018-09-19 DIAGNOSIS — I2510 Atherosclerotic heart disease of native coronary artery without angina pectoris: Secondary | ICD-10-CM | POA: Diagnosis not present

## 2018-09-19 DIAGNOSIS — M545 Low back pain: Secondary | ICD-10-CM | POA: Diagnosis not present

## 2018-09-19 DIAGNOSIS — Z9181 History of falling: Secondary | ICD-10-CM | POA: Diagnosis not present

## 2018-09-20 DIAGNOSIS — R296 Repeated falls: Secondary | ICD-10-CM | POA: Diagnosis not present

## 2018-09-20 DIAGNOSIS — Z9181 History of falling: Secondary | ICD-10-CM | POA: Diagnosis not present

## 2018-09-20 DIAGNOSIS — I110 Hypertensive heart disease with heart failure: Secondary | ICD-10-CM | POA: Diagnosis not present

## 2018-09-20 DIAGNOSIS — I4891 Unspecified atrial fibrillation: Secondary | ICD-10-CM | POA: Diagnosis not present

## 2018-09-20 DIAGNOSIS — M545 Low back pain: Secondary | ICD-10-CM | POA: Diagnosis not present

## 2018-09-20 DIAGNOSIS — I5032 Chronic diastolic (congestive) heart failure: Secondary | ICD-10-CM | POA: Diagnosis not present

## 2018-09-20 DIAGNOSIS — E119 Type 2 diabetes mellitus without complications: Secondary | ICD-10-CM | POA: Diagnosis not present

## 2018-09-20 DIAGNOSIS — J181 Lobar pneumonia, unspecified organism: Secondary | ICD-10-CM | POA: Diagnosis not present

## 2018-09-20 DIAGNOSIS — I2510 Atherosclerotic heart disease of native coronary artery without angina pectoris: Secondary | ICD-10-CM | POA: Diagnosis not present

## 2018-09-23 DIAGNOSIS — M545 Low back pain: Secondary | ICD-10-CM | POA: Diagnosis not present

## 2018-09-23 DIAGNOSIS — I4891 Unspecified atrial fibrillation: Secondary | ICD-10-CM | POA: Diagnosis not present

## 2018-09-23 DIAGNOSIS — I2510 Atherosclerotic heart disease of native coronary artery without angina pectoris: Secondary | ICD-10-CM | POA: Diagnosis not present

## 2018-09-23 DIAGNOSIS — E119 Type 2 diabetes mellitus without complications: Secondary | ICD-10-CM | POA: Diagnosis not present

## 2018-09-23 DIAGNOSIS — J181 Lobar pneumonia, unspecified organism: Secondary | ICD-10-CM | POA: Diagnosis not present

## 2018-09-23 DIAGNOSIS — I110 Hypertensive heart disease with heart failure: Secondary | ICD-10-CM | POA: Diagnosis not present

## 2018-09-23 DIAGNOSIS — Z9181 History of falling: Secondary | ICD-10-CM | POA: Diagnosis not present

## 2018-09-23 DIAGNOSIS — R296 Repeated falls: Secondary | ICD-10-CM | POA: Diagnosis not present

## 2018-09-23 DIAGNOSIS — I5032 Chronic diastolic (congestive) heart failure: Secondary | ICD-10-CM | POA: Diagnosis not present

## 2018-09-26 DIAGNOSIS — J181 Lobar pneumonia, unspecified organism: Secondary | ICD-10-CM | POA: Diagnosis not present

## 2018-09-26 DIAGNOSIS — M545 Low back pain: Secondary | ICD-10-CM | POA: Diagnosis not present

## 2018-09-26 DIAGNOSIS — R296 Repeated falls: Secondary | ICD-10-CM | POA: Diagnosis not present

## 2018-09-26 DIAGNOSIS — I110 Hypertensive heart disease with heart failure: Secondary | ICD-10-CM | POA: Diagnosis not present

## 2018-09-26 DIAGNOSIS — I4891 Unspecified atrial fibrillation: Secondary | ICD-10-CM | POA: Diagnosis not present

## 2018-09-26 DIAGNOSIS — E119 Type 2 diabetes mellitus without complications: Secondary | ICD-10-CM | POA: Diagnosis not present

## 2018-09-26 DIAGNOSIS — I5032 Chronic diastolic (congestive) heart failure: Secondary | ICD-10-CM | POA: Diagnosis not present

## 2018-09-26 DIAGNOSIS — Z9181 History of falling: Secondary | ICD-10-CM | POA: Diagnosis not present

## 2018-09-26 DIAGNOSIS — I2510 Atherosclerotic heart disease of native coronary artery without angina pectoris: Secondary | ICD-10-CM | POA: Diagnosis not present

## 2018-09-28 ENCOUNTER — Encounter: Payer: Self-pay | Admitting: Family Medicine

## 2018-09-28 ENCOUNTER — Other Ambulatory Visit: Payer: Self-pay | Admitting: Family Medicine

## 2018-09-30 MED ORDER — LEVOTHYROXINE SODIUM 25 MCG PO TABS: ORAL_TABLET | ORAL | 0 refills | Status: DC

## 2018-10-03 ENCOUNTER — Ambulatory Visit (INDEPENDENT_AMBULATORY_CARE_PROVIDER_SITE_OTHER): Payer: Medicare HMO | Admitting: General Practice

## 2018-10-03 DIAGNOSIS — Z7901 Long term (current) use of anticoagulants: Secondary | ICD-10-CM | POA: Diagnosis not present

## 2018-10-03 DIAGNOSIS — I4891 Unspecified atrial fibrillation: Secondary | ICD-10-CM

## 2018-10-03 LAB — POCT INR: INR: 1.7 — AB (ref 2.0–3.0)

## 2018-10-03 NOTE — Patient Instructions (Addendum)
Pre visit review using our clinic review tool, if applicable. No additional management support is needed unless otherwise documented below in the visit note.  Take 1 1/2 tablets (4.5 mg) today, (1/16) and then change dosage and take 1 tablet daily except 1/2 tablet on Fridays and nothing on Mondays.  Re-check in 3 weeks.  Daughter, Lugene notified of results and instructions for recheck.

## 2018-10-05 ENCOUNTER — Other Ambulatory Visit: Payer: Self-pay | Admitting: Family Medicine

## 2018-10-07 ENCOUNTER — Other Ambulatory Visit: Payer: Self-pay | Admitting: Cardiovascular Disease

## 2018-10-08 ENCOUNTER — Telehealth: Payer: Self-pay | Admitting: Cardiovascular Disease

## 2018-10-08 NOTE — Telephone Encounter (Signed)
-----   Message from Alba Destine, [place] sent at 10/07/2018  4:01 PM EST ----- Please schedule an appointment  refill sent to pharmacy.

## 2018-10-08 NOTE — Telephone Encounter (Signed)
LMOV to schedule follow up 

## 2018-10-24 ENCOUNTER — Ambulatory Visit: Payer: Medicare HMO

## 2018-10-25 ENCOUNTER — Encounter: Payer: Self-pay | Admitting: Family Medicine

## 2018-10-28 ENCOUNTER — Other Ambulatory Visit: Payer: Self-pay | Admitting: *Deleted

## 2018-10-28 MED ORDER — CLONAZEPAM 0.5 MG PO TABS: 0.5000 mg | ORAL_TABLET | Freq: Every day | ORAL | 0 refills | Status: DC

## 2018-10-28 NOTE — Telephone Encounter (Signed)
[place] request refill [name], she is requesting a #90 day supply, last filled on 07/25/18 #90 tabs with 0 refills, CPE scheduled for 11/06/18

## 2018-10-29 ENCOUNTER — Telehealth: Payer: Self-pay | Admitting: Family Medicine

## 2018-10-29 DIAGNOSIS — M810 Age-related osteoporosis without current pathological fracture: Secondary | ICD-10-CM

## 2018-10-29 DIAGNOSIS — E7800 Pure hypercholesterolemia, unspecified: Secondary | ICD-10-CM

## 2018-10-29 DIAGNOSIS — I10 Essential (primary) hypertension: Secondary | ICD-10-CM

## 2018-10-29 DIAGNOSIS — E559 Vitamin D deficiency, unspecified: Secondary | ICD-10-CM

## 2018-10-29 DIAGNOSIS — E1159 Type 2 diabetes mellitus with other circulatory complications: Secondary | ICD-10-CM

## 2018-10-29 DIAGNOSIS — D518 Other vitamin B12 deficiency anemias: Secondary | ICD-10-CM

## 2018-10-29 DIAGNOSIS — E039 Hypothyroidism, unspecified: Secondary | ICD-10-CM

## 2018-10-29 NOTE — Telephone Encounter (Signed)
-----   Message from Eustace Pen, LPN sent at 4/68/0321  2:35 PM EST ----- Regarding: labs 2/12 Lab orders needed. Thank you.

## 2018-10-30 ENCOUNTER — Ambulatory Visit (INDEPENDENT_AMBULATORY_CARE_PROVIDER_SITE_OTHER): Payer: Medicare HMO

## 2018-10-30 VITALS — BP 132/70 | HR 75 | Temp 97.7°F | Wt 122.5 lb

## 2018-10-30 DIAGNOSIS — Z7901 Long term (current) use of anticoagulants: Secondary | ICD-10-CM | POA: Diagnosis not present

## 2018-10-30 DIAGNOSIS — E559 Vitamin D deficiency, unspecified: Secondary | ICD-10-CM

## 2018-10-30 DIAGNOSIS — D518 Other vitamin B12 deficiency anemias: Secondary | ICD-10-CM | POA: Diagnosis not present

## 2018-10-30 DIAGNOSIS — I4891 Unspecified atrial fibrillation: Secondary | ICD-10-CM

## 2018-10-30 DIAGNOSIS — E039 Hypothyroidism, unspecified: Secondary | ICD-10-CM

## 2018-10-30 DIAGNOSIS — I10 Essential (primary) hypertension: Secondary | ICD-10-CM | POA: Diagnosis not present

## 2018-10-30 DIAGNOSIS — E1159 Type 2 diabetes mellitus with other circulatory complications: Secondary | ICD-10-CM | POA: Diagnosis not present

## 2018-10-30 DIAGNOSIS — Z0000 Encounter for general adult medical examination without abnormal findings: Secondary | ICD-10-CM

## 2018-10-30 DIAGNOSIS — E7800 Pure hypercholesterolemia, unspecified: Secondary | ICD-10-CM

## 2018-10-30 LAB — COMPREHENSIVE METABOLIC PANEL
ALT: 36 U/L — ABNORMAL HIGH (ref 0–35)
AST: 43 U/L — ABNORMAL HIGH (ref 0–37)
Albumin: 3.9 g/dL (ref 3.5–5.2)
Alkaline Phosphatase: 75 U/L (ref 39–117)
BUN: 16 mg/dL (ref 6–23)
CO2: 28 mEq/L (ref 19–32)
Calcium: 9.3 mg/dL (ref 8.4–10.5)
Chloride: 103 mEq/L (ref 96–112)
Creatinine, Ser: 0.97 mg/dL (ref 0.40–1.20)
GFR: 53.92 mL/min — ABNORMAL LOW (ref >60.00)
Glucose, Bld: 230 mg/dL — ABNORMAL HIGH (ref 70–99)
POTASSIUM: 3.6 meq/L (ref 3.5–5.1)
Sodium: 140 mEq/L (ref 135–145)
Total Bilirubin: 0.4 mg/dL (ref 0.2–1.2)
Total Protein: 7.2 g/dL (ref 6.0–8.3)

## 2018-10-30 LAB — CBC WITH DIFFERENTIAL/PLATELET
Basophils Absolute: 0.1 10*3/uL (ref 0.0–0.1)
Basophils Relative: 1 % (ref 0.0–3.0)
Eosinophils Absolute: 0.1 10*3/uL (ref 0.0–0.7)
Eosinophils Relative: 1.4 % (ref 0.0–5.0)
HCT: 40.9 % (ref 36.0–46.0)
HEMOGLOBIN: 13.2 g/dL (ref 12.0–15.0)
Lymphocytes Relative: 19.2 % (ref 12.0–46.0)
Lymphs Abs: 1.1 10*3/uL (ref 0.7–4.0)
MCHC: 32.4 g/dL (ref 30.0–36.0)
MCV: 86.7 fl (ref 78.0–100.0)
Monocytes Absolute: 0.4 10*3/uL (ref 0.1–1.0)
Monocytes Relative: 8 % (ref 3.0–12.0)
Neutro Abs: 3.9 10*3/uL (ref 1.4–7.7)
Neutrophils Relative %: 70.4 % (ref 43.0–77.0)
Platelets: 226 10*3/uL (ref 150.0–400.0)
RBC: 4.71 Mil/uL (ref 3.87–5.11)
RDW: 16.1 % — ABNORMAL HIGH (ref 11.5–15.5)
WBC: 5.5 10*3/uL (ref 4.0–10.5)

## 2018-10-30 LAB — HEMOGLOBIN A1C: Hgb A1c MFr Bld: 6.2 % (ref 4.6–6.5)

## 2018-10-30 LAB — POCT INR: INR: 2.2 (ref 2.0–3.0)

## 2018-10-30 LAB — LIPID PANEL
Cholesterol: 218 mg/dL — ABNORMAL HIGH (ref 0–200)
HDL: 31.3 mg/dL — ABNORMAL LOW (ref >39.00)
NonHDL: 186.71
Total CHOL/HDL Ratio: 7
Triglycerides: 301 mg/dL — ABNORMAL HIGH (ref 0.0–149.0)
VLDL: 60.2 mg/dL — ABNORMAL HIGH (ref 0.0–40.0)

## 2018-10-30 LAB — VITAMIN D 25 HYDROXY (VIT D DEFICIENCY, FRACTURES): VITD: 60.28 ng/mL (ref 30.00–100.00)

## 2018-10-30 LAB — LDL CHOLESTEROL, DIRECT: LDL DIRECT: 152 mg/dL

## 2018-10-30 LAB — VITAMIN B12: VITAMIN B 12: 471 pg/mL (ref 211–911)

## 2018-10-30 LAB — TSH: TSH: 4.62 u[IU]/mL — ABNORMAL HIGH (ref 0.35–4.50)

## 2018-10-30 NOTE — Progress Notes (Signed)
PCP notes:   Health maintenance:  A1C - completed Tetanus vaccine - postponed/insurance  Abnormal screenings:   Hearing - failed  Hearing Screening   125Hz  250Hz  500Hz  1000Hz  2000Hz  3000Hz  4000Hz  6000Hz  8000Hz   Right ear:   0 0 0  0    Left ear:   40 0 0  0     Fall risk - hx of single fall Fall Risk  10/30/2018 10/24/2017 08/02/2016 08/21/2014 08/19/2013  Falls in the past year? 1 Yes No No No  Comment 4-5 falls due to loss of balance - - - -  Number falls in past yr: 1 2 or more - - -  Injury with Fall? 1 No - - -  Comment lacerations - - - -  Risk for fall due to : Impaired balance/gait;Impaired mobility;History of fall(s);Impaired vision - - - -    Patient concerns:   None  Nurse concerns:  None  Next PCP appt:   11/06/18 @ 1430  I reviewed health advisor's note, was available for consultation, and agree with documentation and plan. Loura Pardon MD

## 2018-10-30 NOTE — Patient Instructions (Signed)
INR: 2.2   Continue taking 1 tablet daily except 1/2 tablet on Fridays and nothing on Mondays.  Re-check in 4 weeks.  Daughter, Lugene notified of results and instructions for recheck.

## 2018-10-30 NOTE — Progress Notes (Signed)
Subjective:   Bianca Mueller is a 85 y.o. female who presents for Medicare Annual (Subsequent) preventive examination.  Review of Systems:  N/A Cardiac Risk Factors include: advanced age (>67mn, >>75women);diabetes mellitus;dyslipidemia;hypertension     Objective:     Vitals: BP 132/70 (BP Location: Left Arm, Patient Position: Sitting, Cuff Size: Normal)   Pulse 75   Temp 97.7 F (36.5 C) (Oral)   Wt 122 lb 8 oz (55.6 kg)   SpO2 96%   BMI 23.15 kg/m   Body mass index is 23.15 kg/m.  Advanced Directives 10/30/2018 08/02/2018 08/02/2018 03/18/2018 11/01/2017 04/18/2016 10/30/2015  Does Patient Have a Medical Advance Directive? Yes Yes No Yes Yes No Yes  Type of AParamedicof A[place]Living will Healthcare Power of [place]Living will HMcLend[place]ishol[place] of facility DNR (pink MOST or yellow form) - HO[place]Living will  Does patient want to make changes to medical advance directive? - No - Patient declined - - - - -  Copy of HSalemin Chart? No - copy requested No - copy requested - No - copy requested No - copy requested - No - copy requested  Would patient like information on creating a medical advance directive? - - No - Patient declined - - - -    Tobacco Social History   Tobacco Use  Smoking Status Former Smoker  . Packs/day: 0.25  . Years: 30.00  . Pack years: 7.50  . Types: Cigarettes  Smokeless Tobacco Never Used  Tobacco Comment   "quit smoking in the 1980's"     Counseling given: No Comment: "quit smoking in the 1980's"   Clinical Intake:  Pre-visit preparation completed: Yes  Pain : No/denies pain Pain Score: 0-No pain     Nutritional Status: BMI of 19-24  Normal Nutritional Risks: None Diabetes: No  How often do you need to have someone help you when you read instructions, pamphlets, or other written materials from your doctor or pharmacy?:  1 - Never What is the last grade level you completed in school?: 12th grade  Interpreter Needed?: No  Comments: pt lives with spouse Information entered by :: LPinson, LPN  Past Medical History:  Diagnosis Date  . Arthritis    "hips" (08/07/2014)  . CAD (coronary artery disease)    a. 1989 s/p MI, TPA, PTCA;  b. 06/2014 NSTEMI/Cath: LM nl, LAD 30-40, D2 90, small, LCX 1045mOM1 90, RCA Cs2+ 70-80p, 50-6074m0d (? ruptured plaque), EF 55-60%-->Med Rx.  . Chronic diastolic CHF (congestive heart failure) ([facility]  a. 07/2014 Echo: EF 60-65%, Gr 1 DD, mild to mod TR, PASP 40m69m  . Diverticulosis   . Elevated LFTs    a. 06/2014 Abd U/S: no GB pathology.  . GEMarland KitchenD (gastroesophageal reflux disease)   . Hx of adenomatous colonic polyps 2010   last colonscopy 2010,Dr ElliVira Agaro need for further  colonoscopy given age  . Hypercalcemia   . Hyperlipidemia   . Hypertension   . Myocardial infarction (HCC)[place]89  . Osteoporosis   . PAF/PAFlutter    a. 06/2014->amio and coumadin started.  . Type II diabetes mellitus (HCC)[place]. Wears dentures    partial lower  . Zoster    past   Past Surgical History:  Procedure Laterality Date  . CARDIAC CATHETERIZATION  07/22/2014  . CATARACT EXTRACTION W/PHACO Right 03/18/2018   Procedure: CATARACT EXTRACTION PHACO AND INTRAOCULAR  LENS PLACEMENT (IOC) RIGHT DIABETES;  Surgeon: Eulogio Bear, MD;  Location: [place];  Service: Ophthalmology;  Laterality: Right;  Diabetes - oral meds  . CORONARY ANGIOPLASTY  1989   "@ [facility]"  . LEFT HEART CATHETERIZATION WITH CORONARY ANGIOGRAM N/A 07/22/2014   Procedure: LEFT HEART CATHETERIZATION WITH CORONARY ANGIOGRAM;  Surgeon: Sinclair Grooms, MD;  [facility];  Service: Cardiovascular;  Laterality: N/A;  . SHOULDER OPEN ROTATOR CUFF REPAIR Right 1980's  . VAGINAL HYSTERECTOMY  1970's?   ovaries intact   Family History  Problem Relation Age of Onset  . Heart disease Mother        CAD  .  Heart disease Father        CAD  . Heart disease Brother        CAD   Social History   Socioeconomic History  . Marital status: Married    Spouse name: Not on file  . Number of children: Not on file  . Years of education: Not on file  . Highest education level: Not on file  Occupational History  . Not on file  Social Needs  . Financial resource strain: Not on file  . Food insecurity:    Worry: Not on file    Inability: Not on file  . Transportation needs:    Medical: Not on file    Non-medical: Not on file  Tobacco Use  . Smoking status: Former Smoker    Packs/day: 0.25    Years: 30.00    Pack years: 7.50    Types: Cigarettes  . Smokeless tobacco: Never Used  . Tobacco comment: "quit smoking in the 1980's"  Substance and Sexual Activity  . Alcohol use: No    Alcohol/week: 0.0 standard drinks  . Drug use: No  . Sexual activity: Never  Lifestyle  . Physical activity:    Days per week: Not on file    Minutes per session: Not on file  . Stress: Not on file  Relationships  . Social connections:    Talks on phone: Not on file    Gets together: Not on file    Attends religious service: Not on file    Active member of club or organization: Not on file    Attends meetings of clubs or organizations: Not on file    Relationship status: Not on file  Other Topics Concern  . Not on file  Social History Narrative  . Not on file    Outpatient Encounter Medications as of 10/30/2018  Medication Sig  . ACCU-CHEK FASTCLIX LANCETS MISC Use as instructed to test blood sugar once daily or as needed.  Diagnosis:  E11.9   Non insulin-dependent.  . ALPRAZolam (XANAX) 0.25 MG tablet Take 0.25 mg by mouth as needed.  Marland Kitchen amiodarone (PACERONE) 200 MG tablet TAKE 1 TABLET TWICE DAILY  . Blood Glucose Monitoring Suppl (ACCU-CHEK AVIVA PLUS) W/DEVICE KIT Use as instructed to test blood sugar once daily or as needed.  Diagnosis:  E11.9  Non insulin-dependent.  . busPIRone (BUSPAR) 30 MG  tablet TAKE 1 TABLET TWICE DAILY  . cholecalciferol (VITAMIN D) 1000 UNITS tablet Take 2,000 Units by mouth daily at 12 noon.   . clonazePAM (KLONOPIN) 0.5 MG tablet Take 1 tablet (0.5 mg total) by mouth at bedtime.  . cloNIDine (CATAPRES) 0.2 MG tablet TAKE 1 TABLET TWICE DAILY  . diphenhydrAMINE (BENADRYL) 25 MG tablet Take 25 mg by mouth at bedtime as needed.  . docusate  sodium 100 MG CAPS Take 100 mg by mouth 2 (two) times daily. (Patient taking differently: Take 100 mg by mouth 3 (three) times daily. )  . doxazosin (CARDURA) 4 MG tablet Take 1 tablet (4 mg total) by mouth daily.  . furosemide (LASIX) 40 MG tablet TAKE 1 TABLET DAILY. AS DIRECTED BY CARDIOLOGIST,  MAY TAKE ADDITIONAL DOSES AS NEEDED  . glipiZIDE (GLUCOTROL XL) 2.5 MG 24 hr tablet TAKE 1 TABLET DAILY WITH LUNCH.  Marland Kitchen glucose blood (ACCU-CHEK AVIVA PLUS) test strip USE AS INSTRUCTED TO CHECK BLOOD SUGAR ONCE DAILY OR AS NEEDED.  Dx code E11.59  . hydrALAZINE (APRESOLINE) 100 MG tablet TAKE 1 TABLET THREE TIMES DAILY  . levothyroxine (SYNTHROID, LEVOTHROID) 25 MCG tablet TAKE 1 TABLET EVERY DAY BEFORE BREAKFAST  . lisinopril (PRINIVIL,ZESTRIL) 20 MG tablet TAKE 1 TABLET EVERY DAY  . nitroGLYCERIN (NITROSTAT) 0.4 MG SL tablet Place 1 tablet (0.4 mg total) under the tongue every 5 (five) minutesas needed for chest pain. If not improved seek medical attention in ER  . omeprazole (PRILOSEC) 20 MG capsule Take 1 capsule (20 mg total) by mouth daily.  . sertraline (ZOLOFT) 50 MG tablet Take 1 tablet (50 mg total) by mouth daily.  . sitaGLIPtin (JANUVIA) 100 MG tablet Take 1 tablet (100 mg total) by mouth daily.  . traMADol (ULTRAM) 50 MG tablet Take 0.5-1 tablets (25-50 mg total) by mouth every 8 (eight) hours as needed for moderate pain or severe pain. With food  . warfarin (COUMADIN) 3 MG tablet Take 1/2 -1 pill daily as directed by coumadin clinic.  . [DISCONTINUED] azithromycin (ZITHROMAX Z-PAK) 250 MG tablet Take 2 tablets (500 mg)  on  Day 1,  followed by 1 tablet (250 mg) once daily on Days 2 through 5.  . [DISCONTINUED] levothyroxine (SYNTHROID, LEVOTHROID) 25 MCG tablet TAKE 1 TABLET EVERY DAY BEFORE BREAKFAST  . [DISCONTINUED] silver sulfADIAZINE (SILVADENE) 1 % cream Apply 1 application topically daily.  . [DISCONTINUED] vitamin B-12 (CYANOCOBALAMIN) 1000 MCG tablet Take 1,000 mcg by mouth every other day.    No facility-administered encounter medications on file as of 10/30/2018.     Activities of Daily Living In your present state of health, do you have any difficulty performing the following activities: 10/30/2018 03/18/2018  Hearing? Y N  Vision? Y N  Difficulty concentrating or making decisions? Tempie Donning  Walking or climbing stairs? Y Y  Dressing or bathing? Y N  Doing errands, shopping? Y -  Conservation officer, nature and eating ? Y -  Using the Toilet? Y -  In the past six months, have you accidently leaked urine? Y -  Do you have problems with loss of bowel control? N -  Managing your Medications? Y -  Managing your Finances? Y -  Housekeeping or managing your Housekeeping? Y -  Some recent data might be hidden    Patient Care Team: Tower, Wynelle Fanny, MD as PCP - General Solum, Betsey Holiday, MD as PCP - Endocrinology (Internal Medicine) Minna Merritts, MD as Consulting Physician (Cardiology) Eulogio Bear, MD as Consulting Physician (Ophthalmology)    Assessment:   This is a routine wellness examination for Dulce.   Hearing Screening   _0  _1  _2  _3  _4  _5  _6  _7  _8   Right ear:   0 0 0  0    Left ear:   40 0 0  0    Vision Screening Comments: Vision exam in 2019 with Dr. Benay Pillow   Exercise Activities and  Dietary recommendations Current Exercise Habits: The patient does not participate in regular exercise at present, Exercise limited by: None identified  Goals    . Patient Stated     Starting 10/30/2018, I will continue to take medications as prescribed.        Fall  Risk Fall Risk  10/30/2018 10/24/2017 08/02/2016 08/21/2014 08/19/2013  Falls in the past year? 1 Yes No No No  Comment 4-5 falls due to loss of balance - - - -  Number falls in past yr: 1 2 or more - - -  Injury with Fall? 1 No - - -  Comment lacerations - - - -  Risk for fall due to : Impaired balance/gait;Impaired mobility;History of fall(s);Impaired vision - - - -   Depression Screen PHQ 2/9 Scores 10/30/2018 10/24/2017 08/02/2016 08/21/2014  PHQ - 2 Score 0 0 0 0  PHQ- 9 Score 0 - - -     Cognitive Function MMSE - Mini Mental State Exam 10/30/2018  Not completed: (No Data)    Dx: dementia    Immunization History  Administered Date(s) Administered  . Influenza Split 06/07/2011, 06/14/2012  . Influenza Whole 08/18/2006, 06/19/2007, 06/18/2008, 07/16/2009, 07/07/2010  . Influenza,inj,Quad PF,6+ Mos 06/19/2013, 06/30/2014, 06/24/2015, 07/06/2016, 05/29/2017, 06/28/2018  . Pneumococcal Conjugate-13 08/21/2014  . Pneumococcal Polysaccharide-23 08/18/2005  . Td 08/18/2005  . Zoster 04/05/2010    Screening Tests Health Maintenance  Topic Date Due  . TETANUS/TDAP  09/18/2019 (Originally 08/19/2015)  . OPHTHALMOLOGY EXAM  01/31/2019  . FOOT EXAM  03/07/2019  . HEMOGLOBIN A1C  04/30/2019  . INFLUENZA VACCINE  Completed  . DEXA SCAN  Completed  . PNA vac Low Risk Adult  Completed     Plan:     I have personally reviewed, addressed, and noted the following in the patient's chart:  A. Medical and social history B. Use of alcohol, tobacco or illicit drugs  C. Current medications and supplements D. Functional ability and status E.  Nutritional status F.  Physical activity G. Advance directives H. List of other physicians I.  Hospitalizations, surgeries, and ER visits in previous 12 months J.  Staples to include hearing, vision, cognitive, depression L. Referrals and appointments - none  In addition, I have reviewed and discussed with patient certain preventive  protocols, quality metrics, and best practice recommendations. A written personalized care plan for preventive services as well as general preventive health recommendations were provided to patient.  See attached scanned questionnaire for additional information.   Signed,   Lindell Noe, MHA, BS, LPN Health Coach

## 2018-10-30 NOTE — Patient Instructions (Signed)
Bianca Mueller , Thank you for taking time to come for your Medicare Wellness Visit. I appreciate your ongoing commitment to your health goals. Please review the following plan we discussed and let me know if I can assist you in the future.   These are the goals we discussed: Goals    . Patient Stated     Starting 10/30/2018, I will continue to take medications as prescribed.        This is a list of the screening recommended for you and due dates:  Health Maintenance  Topic Date Due  . Tetanus Vaccine  09/18/2019*  . Eye exam for diabetics  01/31/2019  . Complete foot exam   03/07/2019  . Hemoglobin A1C  04/30/2019  . Flu Shot  Completed  . DEXA scan (bone density measurement)  Completed  . Pneumonia vaccines  Completed  *Topic was postponed. The date shown is not the original due date.   Preventive Care for Adults  A healthy lifestyle and preventive care can promote health and wellness. Preventive health guidelines for adults include the following key practices.  . A routine yearly physical is a good way to check with your health care provider about your health and preventive screening. It is a chance to share any concerns and updates on your health and to receive a thorough exam.  . Visit your dentist for a routine exam and preventive care every 6 months. Brush your teeth twice a day and floss once a day. Good oral hygiene prevents tooth decay and gum disease.  . The frequency of eye exams is based on your age, health, family medical history, use  of contact lenses, and other factors. Follow your health care provider's recommendations for frequency of eye exams.  . Eat a healthy diet. Foods like vegetables, fruits, whole grains, low-fat dairy products, and lean protein foods contain the nutrients you need without too many calories. Decrease your intake of foods high in solid fats, added sugars, and salt. Eat the right amount of calories for you. Get information about a proper diet  from your health care provider, if necessary.  . Regular physical exercise is one of the most important things you can do for your health. Most adults should get at least 150 minutes of moderate-intensity exercise (any activity that increases your heart rate and causes you to sweat) each week. In addition, most adults need muscle-strengthening exercises on 2 or more days a week.  Silver Sneakers may be a benefit available to you. To determine eligibility, you may visit the website: www.silversneakers.com or contact program at 902-719-5484 Mon-Fri between 8AM-8PM.   . Maintain a healthy weight. The body mass index (BMI) is a screening tool to identify possible weight problems. It provides an estimate of body fat based on height and weight. Your health care provider can find your BMI and can help you achieve or maintain a healthy weight.   For adults 20 years and older: ? A BMI below 18.5 is considered underweight. ? A BMI of 18.5 to 24.9 is normal. ? A BMI of 25 to 29.9 is considered overweight. ? A BMI of 30 and above is considered obese.   . Maintain normal blood lipids and cholesterol levels by exercising and minimizing your intake of saturated fat. Eat a balanced diet with plenty of fruit and vegetables. Blood tests for lipids and cholesterol should begin at age 24 and be repeated every 5 years. If your lipid or cholesterol levels are high, you are  over 66, or you are at high risk for heart disease, you may need your cholesterol levels checked more frequently. Ongoing high lipid and cholesterol levels should be treated with medicines if diet and exercise are not working.  . If you smoke, find out from your health care provider how to quit. If you do not use tobacco, please do not start.  . If you choose to drink alcohol, please do not consume more than 2 drinks per day. One drink is considered to be 12 ounces (355 mL) of beer, 5 ounces (148 mL) of wine, or 1.5 ounces (44 mL) of liquor.  .  If you are 22-38 years old, ask your health care provider if you should take aspirin to prevent strokes.  . Use sunscreen. Apply sunscreen liberally and repeatedly throughout the day. You should seek shade when your shadow is shorter than you. Protect yourself by wearing long sleeves, pants, a wide-brimmed hat, and sunglasses year round, whenever you are outdoors.  . Once a month, do a whole body skin exam, using a mirror to look at the skin on your back. Tell your health care provider of new moles, moles that have irregular borders, moles that are larger than a pencil eraser, or moles that have changed in shape or color.

## 2018-11-03 ENCOUNTER — Other Ambulatory Visit: Payer: Self-pay | Admitting: Family Medicine

## 2018-11-04 ENCOUNTER — Other Ambulatory Visit: Payer: Self-pay | Admitting: *Deleted

## 2018-11-04 NOTE — Telephone Encounter (Signed)
Routing to coumadin nurse

## 2018-11-05 ENCOUNTER — Encounter: Payer: Self-pay | Admitting: Family Medicine

## 2018-11-06 ENCOUNTER — Ambulatory Visit (INDEPENDENT_AMBULATORY_CARE_PROVIDER_SITE_OTHER): Payer: Medicare HMO | Admitting: Family Medicine

## 2018-11-06 ENCOUNTER — Encounter: Payer: Self-pay | Admitting: Family Medicine

## 2018-11-06 VITALS — BP 126/74 | HR 86 | Temp 97.3°F | Ht 61.0 in | Wt 122.5 lb

## 2018-11-06 DIAGNOSIS — R7401 Elevation of levels of liver transaminase levels: Secondary | ICD-10-CM

## 2018-11-06 DIAGNOSIS — F0390 Unspecified dementia without behavioral disturbance: Secondary | ICD-10-CM

## 2018-11-06 DIAGNOSIS — I10 Essential (primary) hypertension: Secondary | ICD-10-CM

## 2018-11-06 DIAGNOSIS — M810 Age-related osteoporosis without current pathological fracture: Secondary | ICD-10-CM

## 2018-11-06 DIAGNOSIS — I5032 Chronic diastolic (congestive) heart failure: Secondary | ICD-10-CM | POA: Diagnosis not present

## 2018-11-06 DIAGNOSIS — E559 Vitamin D deficiency, unspecified: Secondary | ICD-10-CM

## 2018-11-06 DIAGNOSIS — Z0000 Encounter for general adult medical examination without abnormal findings: Secondary | ICD-10-CM | POA: Diagnosis not present

## 2018-11-06 DIAGNOSIS — I4891 Unspecified atrial fibrillation: Secondary | ICD-10-CM | POA: Diagnosis not present

## 2018-11-06 DIAGNOSIS — E1159 Type 2 diabetes mellitus with other circulatory complications: Secondary | ICD-10-CM

## 2018-11-06 DIAGNOSIS — I700 Atherosclerosis of aorta: Secondary | ICD-10-CM

## 2018-11-06 DIAGNOSIS — E119 Type 2 diabetes mellitus without complications: Secondary | ICD-10-CM | POA: Diagnosis not present

## 2018-11-06 DIAGNOSIS — E7800 Pure hypercholesterolemia, unspecified: Secondary | ICD-10-CM

## 2018-11-06 DIAGNOSIS — E039 Hypothyroidism, unspecified: Secondary | ICD-10-CM

## 2018-11-06 DIAGNOSIS — F419 Anxiety disorder, unspecified: Secondary | ICD-10-CM

## 2018-11-06 DIAGNOSIS — R740 Nonspecific elevation of levels of transaminase and lactic acid dehydrogenase [LDH]: Secondary | ICD-10-CM

## 2018-11-06 DIAGNOSIS — D518 Other vitamin B12 deficiency anemias: Secondary | ICD-10-CM

## 2018-11-06 NOTE — Assessment & Plan Note (Signed)
Pt c/o less stamina but not sob or edema  Will f/u with cardiology as planned

## 2018-11-06 NOTE — Assessment & Plan Note (Signed)
Lab Results  Component Value Date   [id number] 10/30/2018   Therapeutic on current supplementation

## 2018-11-06 NOTE — Assessment & Plan Note (Signed)
bp in fair control at this time  BP Readings from Last 1 Encounters:  11/06/18 126/74   No changes needed Most recent labs reviewed  Disc lifstyle change with low sodium diet and exercise

## 2018-11-06 NOTE — Assessment & Plan Note (Signed)
Disc goals for lipids and reasons to control them Rev last labs with pt Rev low sat fat diet in detail  Pt is unable to tolerate statins /and they raise her LFTs significating Overall stable with low HDL

## 2018-11-06 NOTE — Progress Notes (Signed)
Subjective:    Patient ID: Bianca Mueller, female    DOB: 08/21/1926, 85 y.o.   MRN: [id number]  HPI  Here for health maintenance exam and to review chronic medical problems    Feeling fine today    Wt Readings from Last 3 Encounters:  11/06/18 122 lb 8 oz (55.6 kg)  10/30/18 122 lb 8 oz (55.6 kg)  08/02/18 125 lb 10.6 oz (57 kg)  fairly stable weight  Appetite is "not too good" - is eating however   23.15 kg/m    Had amw visit 2/12 Hearing is poor in both ears in screen - does not desire hearing aide  Prone to falling due to impairment of balance and judgement and also impaired vision  (has had some skin tears)     Daughter contacted me recently to update re: dementia Having some hallucinations at night (worms in the bed)  There was also a ? As to whether a hired caregiver did not give her the pm klonopin for a while (possibly stole it) Care givers have rotated  Things are improved now   Per pt -she sleeps "night and day"  Mood is pretty good  Tends to be a worrier   Eye exam 5/19 with retinopathy   dexa 6/11 Has OP Declines another dexa  No fractures/has had falls Intolerant of alendronate D level is 60   No longer doing breaet cancer screening    zostavax 7/11  bp is stable today  No cp or palpitations or headaches or edema  No side effects to medicines  BP Readings from Last 3 Encounters:  11/06/18 126/74  10/30/18 132/70  08/08/18 128/80     Cardiac status - due to see Dr Rockey Situ in April  Does not have much stamina /unsure if heart is the reason  Not much swelling    Hypothyroidism  Pt has no clinical changes No change in energy level/ hair or skin/ edema and no tremor Lab Results  Component Value Date   TSH 4.62 (H) 10/30/2018    Last check before was 5.56 - had missed doses  Still misses a few doses but not as many    DM2 Lab Results  Component Value Date   HGBA1C 6.2 10/30/2018  improved control  Eats less but eats what she  wants to  Does avoid sweets very often  Improved  from 6.7   Elevated transaminases remain stable Lab Results  Component Value Date   ALT 36 (H) 10/30/2018   AST 43 (H) 10/30/2018   ALKPHOS 75 10/30/2018   BILITOT 0.4 10/30/2018   very mild   Dementia - no big changes/ likely progressing  Controlling mood and anxiety help Klonopin at night  zoloft  buspar    Lab Results  Component Value Date   WBC 5.5 10/30/2018   HGB 13.2 10/30/2018   HCT 40.9 10/30/2018   MCV 86.7 10/30/2018   PLT 226.0 10/30/2018    Lab Results  Component Value Date   CREATININE 0.97 10/30/2018   BUN 16 10/30/2018   NA 140 10/30/2018   K 3.6 10/30/2018   CL 103 10/30/2018   CO2 28 10/30/2018   Lab Results  Component Value Date   VITAMINB12 471 10/30/2018    Cholesterol  Lab Results  Component Value Date   CHOL 218 (H) 10/30/2018   CHOL 132 10/24/2017   CHOL 271 (H) 08/15/2017   Lab Results  Component Value Date   HDL 31.30 (L) 10/30/2018  HDL 45.70 10/24/2017   HDL 39.40 08/15/2017   Lab Results  Component Value Date   LDLCALC 96 01/04/2016   LDLCALC 77 08/24/2015   LDLCALC 41 07/19/2014   Lab Results  Component Value Date   TRIG 301.0 (H) 10/30/2018   TRIG 228.0 (H) 10/24/2017   TRIG 320.0 (H) 08/15/2017   Lab Results  Component Value Date   CHOLHDL 7 10/30/2018   CHOLHDL 3 10/24/2017   CHOLHDL 7 08/15/2017   Lab Results  Component Value Date   LDLDIRECT 152.0 10/30/2018   LDLDIRECT 63.0 10/24/2017   LDLDIRECT 177.0 08/15/2017   Patient Active Problem List   Diagnosis Date Noted  . Aortic atherosclerosis ([place]) 11/06/2018  . Falls frequently 08/08/2018  . Left-sided chest wall pain 07/17/2018  . Hypothyroid 02/28/2018  . Dementia, senile ([place]) 01/16/2018  . Medicare annual wellness visit, subsequent 10/24/2017  . Insomnia disorder 08/15/2017  . Long term (current) use of anticoagulants 06/26/2017  . At high risk for falls 11/16/2016  . Routine general  medical examination at a health care facility 08/03/2016  . Anxiety 11/08/2015  . Gait disorder 09/21/2015  . History of fall 08/24/2015  . Hearing loss 08/24/2015  . Acute on chronic diastolic heart failure ([place]) 08/07/2014  . Encounter for therapeutic drug monitoring 08/06/2014  . Chronic diastolic CHF (congestive heart failure) ([place])   . CAD (coronary artery disease)   . Atrial fibrillation ([place])   . Transaminitis 07/20/2014  . Atrial flutter ([place]) 07/20/2014  . NSTEMI (non-ST elevated myocardial infarction) ([place]) 07/18/2014  . Encounter for Medicare annual wellness exam 08/19/2013  . Skin tear of lower leg without complication, initial encounter 03/16/2013  . Other screening mammogram 05/17/2012  . Adult failure to thrive 05/17/2011  . Diabetes mellitus ([place]) 02/14/2011  . ANEMIA, VITAMIN B12 DEFICIENCY 09/26/2010  . PEDAL EDEMA 03/16/2010  . [place] ALLERGY 09/06/2009  . Vitamin D deficiency 03/02/2009  . DIVERTICULITIS, COLON 11/12/2008  . COLONIC POLYPS, HX OF 09/02/2008  . HYPERCHOLESTEROLEMIA 12/10/2006  . Essential hypertension 12/10/2006  . Osteoporosis 03/18/2002   Past Medical History:  Diagnosis Date  . Arthritis    "hips" (08/07/2014)  . CAD (coronary artery disease)    a. 1989 s/p MI, TPA, PTCA;  b. 06/2014 NSTEMI/Cath: LM nl, LAD 30-40, D2 90, small, LCX 116m OM1 90, RCA Cs2+ 70-80p, 50-630m90d (? ruptured plaque), EF 55-60%-->Med Rx.  . Chronic diastolic CHF (congestive heart failure) ([place]   a. 07/2014 Echo: EF 60-65%, Gr 1 DD, mild to mod TR, PASP 3773m.  . Diverticulosis   . Elevated LFTs    a. 06/2014 Abd U/S: no GB pathology.  . GMarland KitchenRD (gastroesophageal reflux disease)   . Hx of adenomatous colonic polyps 2010   last colonscopy 2010,Dr EllVira Agarno need for further  colonoscopy given age  . Hypercalcemia   . Hyperlipidemia   . Hypertension   . Myocardial infarction ([facility]  . Osteoporosis   . PAF/PAFlutter    a. 06/2014->amio and coumadin  started.  . Type II diabetes mellitus ([facility] . Wears dentures    partial lower  . Zoster    past   Past Surgical History:  Procedure Laterality Date  . CARDIAC CATHETERIZATION  07/22/2014  . CATARACT EXTRACTION W/PHACO Right 03/18/2018   Procedure: CATARACT EXTRACTION PHACO AND INTRAOCULAR LENS PLACEMENT ([place] [facility];  Surgeon: KinEulogio BearD;  [facility];  Laterality: Right;  Diabetes - oral meds  .  CORONARY ANGIOPLASTY  1989   "@ [facility]"  . LEFT HEART CATHETERIZATION WITH CORONARY ANGIOGRAM N/A 07/22/2014   Procedure: LEFT HEART CATHETERIZATION WITH CORONARY ANGIOGRAM;  Surgeon: Sinclair Grooms, MD;  [facility] C[facility] LAB;  Service: Cardiovascular;  Laterality: N/A;  . SHOULDER OPEN ROTATOR CUFF REPAIR Right 1980's  . VAGINAL HYSTERECTOMY  1970's?   ovaries intact   Social History   Tobacco Use  . Smoking status: Former Smoker    Packs/day: 0.25    Years: 30.00    Pack years: 7.50    Types: Cigarettes  . Smokeless tobacco: Never Used  . Tobacco comment: "quit smoking in the 1980's"  Substance Use Topics  . Alcohol use: No    Alcohol/week: 0.0 standard drinks  . Drug use: No   Family History  Problem Relation Age of Onset  . Heart disease Mother        CAD  . Heart disease Father        CAD  . Heart disease Brother        CAD   Allergies  Allergen Reactions  . Alendronate Sodium Other (See Comments)    unknown  . Doxycycline Other (See Comments)    Inc LFT  . Isosorbide Nitrate Other (See Comments)    Doesn't remember reaction  . Lidocaine Nausea Only    Given at Dentist office  . Metformin And Related     Decreases appetite resulting in weight loss  . Metronidazole Nausea And Vomiting    REACTION: sick/ GI  . Nifedipine Other (See Comments)    Doesn't remember reaction   Current Outpatient Medications on File Prior to Visit  Medication Sig Dispense Refill  . ACCU-CHEK FASTCLIX LANCETS MISC Use as  instructed to test blood sugar once daily or as needed.  Diagnosis:  E11.9   Non insulin-dependent. 102 each 4  . ALPRAZolam (XANAX) 0.25 MG tablet Take 0.25 mg by mouth as needed.    Marland Kitchen amiodarone (PACERONE) 200 MG tablet TAKE 1 TABLET TWICE DAILY (Patient taking differently: Take 200 mg by mouth daily. ) 180 tablet 0  . Blood Glucose Monitoring Suppl (ACCU-CHEK AVIVA PLUS) W/DEVICE KIT Use as instructed to test blood sugar once daily or as needed.  Diagnosis:  E11.9  Non insulin-dependent. 1 kit 0  . busPIRone (BUSPAR) 30 MG tablet TAKE 1 TABLET TWICE DAILY 180 tablet 1  . cholecalciferol (VITAMIN D) 1000 UNITS tablet Take 2,000 Units by mouth daily at 12 noon.     . clonazePAM (KLONOPIN) 0.5 MG tablet Take 1 tablet (0.5 mg total) by mouth at bedtime. 90 tablet 0  . cloNIDine (CATAPRES) 0.2 MG tablet TAKE 1 TABLET TWICE DAILY 180 tablet 1  . diphenhydrAMINE (BENADRYL) 25 MG tablet Take 25 mg by mouth at bedtime as needed.    . docusate sodium 100 MG CAPS Take 100 mg by mouth 2 (two) times daily. (Patient taking differently: Take 100 mg by mouth 3 (three) times daily. ) 10 capsule 0  . doxazosin (CARDURA) 4 MG tablet Take 1 tablet (4 mg total) by mouth daily.    . furosemide (LASIX) 40 MG tablet TAKE 1 TABLET DAILY. AS DIRECTED BY CARDIOLOGIST,  MAY TAKE ADDITIONAL DOSES AS NEEDED 180 tablet 2  . glipiZIDE (GLUCOTROL XL) 2.5 MG 24 hr tablet TAKE 1 TABLET DAILY WITH LUNCH. 90 tablet 1  . glucose blood (ACCU-CHEK AVIVA PLUS) test strip USE AS INSTRUCTED TO CHECK BLOOD SUGAR ONCE DAILY OR AS NEEDED.  Dx  code E11.59 100 each 3  . hydrALAZINE (APRESOLINE) 100 MG tablet TAKE 1 TABLET THREE TIMES DAILY 270 tablet 1  . levothyroxine (SYNTHROID, LEVOTHROID) 25 MCG tablet TAKE 1 TABLET EVERY DAY BEFORE BREAKFAST 90 tablet 1  . lisinopril (PRINIVIL,ZESTRIL) 20 MG tablet TAKE 1 TABLET EVERY DAY 90 tablet 1  . nitroGLYCERIN (NITROSTAT) 0.4 MG SL tablet Place 1 tablet (0.4 mg total) under the tongue every 5  (five) minutesas needed for chest pain. If not improved seek medical attention in ER 25 tablet 1  . omeprazole (PRILOSEC) 20 MG capsule Take 1 capsule (20 mg total) by mouth daily. 90 capsule 3  . sertraline (ZOLOFT) 50 MG tablet Take 1 tablet (50 mg total) by mouth daily. 90 tablet 3  . sitaGLIPtin (JANUVIA) 100 MG tablet Take 1 tablet (100 mg total) by mouth daily. 90 tablet 1  . warfarin (COUMADIN) 3 MG tablet Take 1 tablet daily or TAKE AS DIRECTED BY ANTI COAGULATION CLINIC.  90 day 90 tablet 1   No current facility-administered medications on file prior to visit.     Review of Systems  Constitutional: Positive for fatigue. Negative for activity change, appetite change, fever and unexpected weight change.  HENT: Positive for hearing loss. Negative for congestion, ear pain, rhinorrhea, sinus pressure and sore throat.   Eyes: Negative for pain, redness and visual disturbance.  Respiratory: Negative for cough, chest tightness, shortness of breath and wheezing.   Cardiovascular: Negative for chest pain, palpitations and leg swelling.  Gastrointestinal: Negative for abdominal pain, blood in stool, constipation and diarrhea.  Endocrine: Negative for polydipsia and polyuria.  Genitourinary: Negative for dysuria, frequency and urgency.  Musculoskeletal: Positive for arthralgias, back pain and gait problem. Negative for myalgias.  Skin: Negative for pallor and rash.  Allergic/Immunologic: Negative for environmental allergies.  Neurological: Negative for dizziness, syncope, facial asymmetry, speech difficulty, weakness, light-headedness and headaches.       Poor balance   Hematological: Negative for adenopathy. Does not bruise/bleed easily.  Psychiatric/Behavioral: Positive for confusion. Negative for decreased concentration, dysphoric mood and sleep disturbance. The patient is nervous/anxious.        Dementia -progressive Sometimes confused at night (has had hallucination)        Objective:     Physical Exam Constitutional:      General: She is not in acute distress.    Appearance: Normal appearance. She is well-developed and normal weight.     Comments: Frail appearing In wheelchair   HENT:     Head: Normocephalic and atraumatic.     Right Ear: Tympanic membrane, ear canal and external ear normal.     Left Ear: Tympanic membrane, ear canal and external ear normal.     Ears:     Comments: Scant cerumen    Nose: Nose normal.     Mouth/Throat:     Mouth: Mucous membranes are moist.     Pharynx: Oropharynx is clear.  Eyes:     General: No scleral icterus.       Right eye: No discharge.        Left eye: No discharge.     Conjunctiva/sclera: Conjunctivae normal.     Pupils: Pupils are equal, round, and reactive to light.  Neck:     Musculoskeletal: Normal range of motion and neck supple.     Thyroid: No thyromegaly.     Vascular: No carotid bruit or JVD.  Cardiovascular:     Rate and Rhythm: Normal rate and regular rhythm.  Heart sounds: Normal heart sounds. No gallop.   Pulmonary:     Effort: Pulmonary effort is normal. No respiratory distress.     Breath sounds: Normal breath sounds. No wheezing or rales.     Comments: Few crackles in bases Good air exchange Abdominal:     General: Bowel sounds are normal. There is no distension.     Palpations: Abdomen is soft. There is no mass.     Tenderness: There is no abdominal tenderness.     Hernia: No hernia is present.  Musculoskeletal:        General: No tenderness.     Right lower leg: No edema.     Left lower leg: No edema.     Comments: No pitting edema  Sock line noted   Lymphadenopathy:     Cervical: No cervical adenopathy.  Skin:    General: Skin is warm and dry.     Coloration: Skin is not pale.     Findings: No erythema or rash.     Comments: sks and lentigines diffusely  Neurological:     Mental Status: She is alert. Mental status is at baseline.     Cranial Nerves: No cranial nerve deficit.      Motor: No abnormal muscle tone.     Deep Tendon Reflexes: Reflexes are normal and symmetric. Reflexes normal.  Psychiatric:        Mood and Affect: Mood normal.     Comments: Pleasant  Answers questions appropriately            Assessment & Plan:   Problem List Items Addressed This Visit      Cardiovascular and Mediastinum   Essential hypertension    bp in fair control at this time  BP Readings from Last 1 Encounters:  11/06/18 126/74   No changes needed Most recent labs reviewed  Disc lifstyle change with low sodium diet and exercise        Atrial fibrillation (HCC)    Normal rhythm today Continues low dose amiodarone and anticoagulation       Chronic diastolic CHF (congestive heart failure) ([place])    Pt c/o less stamina but not sob or edema  Will f/u with cardiology as planned       Aortic atherosclerosis ([place])    No symptoms         Endocrine   Diabetes mellitus ([place])    Lab Results  Component Value Date   HGBA1C 6.2 10/30/2018   No hypoglycemia  utd eye care  Good foot care  Cannot tolerate statins  Ace for renal protection   Continues januvia and glipizide       Hypothyroid    Hypothyroidism  Pt has no clinical changes No change in energy level/ hair or skin/ edema and no tremor Lab Results  Component Value Date   TSH 4.62 (H) 10/30/2018    Has missed a few doses recently - for this reason will not increase levothyroxine dose         Nervous and Auditory   Dementia, senile ([place])    Pt has had some episodes of hallucinations at night  There is a question of one caregiver not giving her the pm klonopin dose  Seems better now  Controlling anxiety does help         Musculoskeletal and Integument   Osteoporosis    Declines another dexa scan Intolerant of bisphosphenate Has fallen/no fractures Enc to continue ca and D D level is  therapeutic         Other   Vitamin D deficiency    Vitamin D level is therapeutic with current  supplementation Disc importance of this to bone and overall health       HYPERCHOLESTEROLEMIA    Disc goals for lipids and reasons to control them Rev last labs with pt Rev low sat fat diet in detail  Pt is unable to tolerate statins /and they raise her LFTs significating Overall stable with low HDL      ANEMIA, VITAMIN B12 DEFICIENCY    Lab Results  Component Value Date   [id number] 10/30/2018   Therapeutic on current supplementation       Transaminitis    Mild and stable  Continue to follow Improved off statin       Anxiety    Stable on current medications       Routine general medical examination at a health care facility - Primary    Reviewed health habits including diet and exercise and skin cancer prevention Reviewed appropriate screening tests for age  Also reviewed health mt list, fam hx and immunization status , as well as social and family history   See HPI amw reviewed  Pt does not desire hearing aides Declines breast cancer screen or dexa  Labs reviewed  Safety and fall prev discussed

## 2018-11-06 NOTE — Assessment & Plan Note (Signed)
No symptoms 

## 2018-11-06 NOTE — Assessment & Plan Note (Addendum)
Normal rhythm today Continues low dose amiodarone and anticoagulation

## 2018-11-06 NOTE — Assessment & Plan Note (Signed)
Mild and stable  Continue to follow Improved off statin

## 2018-11-06 NOTE — Patient Instructions (Signed)
Keep me posted re: night time symptoms and hallucinations   Let me know if you become interested in a hearing aide   Continue regular meals  Diabetes is in good control  Labs are stable for the most part   We need labs in 6 months

## 2018-11-06 NOTE — Assessment & Plan Note (Signed)
Vitamin D level is therapeutic with current supplementation Disc importance of this to bone and overall health  

## 2018-11-06 NOTE — Assessment & Plan Note (Signed)
Declines another dexa scan Intolerant of bisphosphenate Has fallen/no fractures Enc to continue ca and D D level is therapeutic

## 2018-11-06 NOTE — Assessment & Plan Note (Signed)
Hypothyroidism  Pt has no clinical changes No change in energy level/ hair or skin/ edema and no tremor Lab Results  Component Value Date   TSH 4.62 (H) 10/30/2018    Has missed a few doses recently - for this reason will not increase levothyroxine dose

## 2018-11-06 NOTE — Assessment & Plan Note (Signed)
Lab Results  Component Value Date   HGBA1C 6.2 10/30/2018   No hypoglycemia  utd eye care  Good foot care  Cannot tolerate statins  Ace for renal protection   Continues januvia and glipizide

## 2018-11-06 NOTE — Assessment & Plan Note (Signed)
Pt has had some episodes of hallucinations at night  There is a question of one caregiver not giving her the pm klonopin dose  Seems better now  Controlling anxiety does help

## 2018-11-06 NOTE — Assessment & Plan Note (Signed)
Stable on current medications 

## 2018-11-06 NOTE — Assessment & Plan Note (Signed)
Reviewed health habits including diet and exercise and skin cancer prevention Reviewed appropriate screening tests for age  Also reviewed health mt list, fam hx and immunization status , as well as social and family history   See HPI amw reviewed  Pt does not desire hearing aides Declines breast cancer screen or dexa  Labs reviewed  Safety and fall prev discussed

## 2018-11-24 ENCOUNTER — Other Ambulatory Visit: Payer: Self-pay | Admitting: Family Medicine

## 2018-11-28 ENCOUNTER — Other Ambulatory Visit: Payer: Self-pay

## 2018-11-28 ENCOUNTER — Ambulatory Visit (INDEPENDENT_AMBULATORY_CARE_PROVIDER_SITE_OTHER): Payer: Medicare HMO | Admitting: General Practice

## 2018-11-28 DIAGNOSIS — I4891 Unspecified atrial fibrillation: Secondary | ICD-10-CM

## 2018-11-28 DIAGNOSIS — Z7901 Long term (current) use of anticoagulants: Secondary | ICD-10-CM | POA: Diagnosis not present

## 2018-11-28 LAB — POCT INR: INR: 1.6 — AB (ref 2.0–3.0)

## 2018-11-28 NOTE — Patient Instructions (Addendum)
Pre visit review using our clinic review tool, if applicable. No additional management support is needed unless otherwise documented below in the visit note.  Please take 1 1/2 tablets today (3/12) and 1 tablet tomorrow (3/13 and then change dosage and take 1 tablet daily except 1/2 tablet on Mondays and Fridays.  Re-check in 3 weeks.  Daughter, Lugene notified of results and instructions for recheck.

## 2018-12-16 ENCOUNTER — Encounter: Payer: Self-pay | Admitting: Family Medicine

## 2018-12-19 ENCOUNTER — Other Ambulatory Visit: Payer: Self-pay

## 2018-12-19 ENCOUNTER — Ambulatory Visit (INDEPENDENT_AMBULATORY_CARE_PROVIDER_SITE_OTHER): Payer: Medicare HMO

## 2018-12-19 ENCOUNTER — Other Ambulatory Visit (INDEPENDENT_AMBULATORY_CARE_PROVIDER_SITE_OTHER): Payer: Medicare HMO

## 2018-12-19 DIAGNOSIS — I4892 Unspecified atrial flutter: Secondary | ICD-10-CM

## 2018-12-19 DIAGNOSIS — I4891 Unspecified atrial fibrillation: Secondary | ICD-10-CM | POA: Diagnosis not present

## 2018-12-19 DIAGNOSIS — Z7901 Long term (current) use of anticoagulants: Secondary | ICD-10-CM

## 2018-12-19 LAB — POCT INR: INR: 3.7 — AB (ref 2.0–3.0)

## 2018-12-19 NOTE — Patient Instructions (Signed)
INR today: 3.7  Hold coumadin tomorrow (already taken today) and take 1.5mg  on Saturday and then decrease dose back to taking 3mg  daily EXCEPT for 1.5mg  on Fridays and 0mg  on Mondays.  Recheck in 2 weeks.  Daughter, Lugene notified of results and instructions for recheck.

## 2019-01-02 ENCOUNTER — Other Ambulatory Visit (INDEPENDENT_AMBULATORY_CARE_PROVIDER_SITE_OTHER): Payer: Medicare HMO

## 2019-01-02 ENCOUNTER — Ambulatory Visit (INDEPENDENT_AMBULATORY_CARE_PROVIDER_SITE_OTHER): Payer: Medicare HMO | Admitting: General Practice

## 2019-01-02 DIAGNOSIS — Z7901 Long term (current) use of anticoagulants: Secondary | ICD-10-CM

## 2019-01-02 DIAGNOSIS — I4891 Unspecified atrial fibrillation: Secondary | ICD-10-CM | POA: Diagnosis not present

## 2019-01-02 LAB — POCT INR: INR: 2.2 (ref 2.0–3.0)

## 2019-01-02 NOTE — Patient Instructions (Signed)
Pre visit review using our clinic review tool, if applicable. No additional management support is needed unless otherwise documented below in the visit note.  Continue to take 3mg  daily EXCEPT for 1.5mg  on Fridays and 0mg  on Mondays.  Recheck in 4 weeks.  Daughter, Lugene notified of results and instructions for recheck.

## 2019-01-06 ENCOUNTER — Other Ambulatory Visit: Payer: Self-pay | Admitting: Family Medicine

## 2019-01-14 ENCOUNTER — Other Ambulatory Visit: Payer: Self-pay | Admitting: Family Medicine

## 2019-01-14 MED ORDER — CLONAZEPAM 0.5 MG PO TABS: 0.5000 mg | ORAL_TABLET | Freq: Every day | ORAL | 0 refills | Status: DC

## 2019-01-14 NOTE — Telephone Encounter (Signed)
[place] request refill [name]. Last filled on 10/28/2018  #90 tabs with 0 refills, Last seen CPE scheduled for 11/06/18  Bianca Mueller to refill early due to putting this medication in bubble packs for a month at a time. She only have 10 tablets from the previous refill.

## 2019-01-15 ENCOUNTER — Ambulatory Visit: Payer: Medicare HMO | Admitting: Cardiovascular Disease

## 2019-01-30 ENCOUNTER — Ambulatory Visit (INDEPENDENT_AMBULATORY_CARE_PROVIDER_SITE_OTHER): Payer: Medicare HMO | Admitting: General Practice

## 2019-01-30 ENCOUNTER — Other Ambulatory Visit (INDEPENDENT_AMBULATORY_CARE_PROVIDER_SITE_OTHER): Payer: Medicare HMO

## 2019-01-30 DIAGNOSIS — I4891 Unspecified atrial fibrillation: Secondary | ICD-10-CM | POA: Diagnosis not present

## 2019-01-30 DIAGNOSIS — Z7901 Long term (current) use of anticoagulants: Secondary | ICD-10-CM

## 2019-01-30 DIAGNOSIS — I4892 Unspecified atrial flutter: Secondary | ICD-10-CM

## 2019-01-30 LAB — POCT INR: INR: 2.9 (ref 2.0–3.0)

## 2019-01-30 NOTE — Patient Instructions (Addendum)
Pre visit review using our clinic review tool, if applicable. No additional management support is needed unless otherwise documented below in the visit note.  Continue to take 3mg  daily EXCEPT for 1.5mg  on Fridays and 0mg  on Mondays.  Recheck in 6 weeks.  Daughter, Lugene notified of results and instructions for recheck.

## 2019-02-03 ENCOUNTER — Telehealth: Payer: Self-pay | Admitting: *Deleted

## 2019-02-03 NOTE — Telephone Encounter (Signed)
Lugene (daughter) notified of Dr. Marliss Coots comments and verbalized understanding

## 2019-02-03 NOTE — Telephone Encounter (Signed)
Patient's daughter [name] on [place]) called stating that when her mom took her night time medication she felt that a pill got stuck in her throat. Lugene stated that this morning her mom still feels that the pill is stuck and has been trying to get it up. Lugene stated that she is not sure if the pill is still in her throat or if it is no longer there, but may have irritated her esophagus?  Lugene requested a call back with suggestions as what she should do. Lugene stated that the caregiver told her that her mom did eat some cereal this morning, but still feels that something is stuck in her throat.

## 2019-02-03 NOTE — Telephone Encounter (Signed)
If she is able to eat and drink that is reassuring but if the esophageal discomfort persists they will need to take her to the ED for eval (she may need imaging or EGD)  Watch for nausea/vomiting or worse discomfort  Keep me posted

## 2019-02-18 ENCOUNTER — Telehealth: Payer: Self-pay

## 2019-02-18 NOTE — Telephone Encounter (Signed)

## 2019-02-21 ENCOUNTER — Encounter: Payer: Self-pay | Admitting: Family Medicine

## 2019-02-21 ENCOUNTER — Other Ambulatory Visit: Payer: Self-pay

## 2019-02-21 ENCOUNTER — Ambulatory Visit (INDEPENDENT_AMBULATORY_CARE_PROVIDER_SITE_OTHER): Payer: Medicare HMO | Admitting: Family Medicine

## 2019-02-21 VITALS — BP 122/80 | HR 82 | Temp 98.2°F | Ht 61.0 in | Wt 122.0 lb

## 2019-02-21 DIAGNOSIS — R21 Rash and other nonspecific skin eruption: Secondary | ICD-10-CM

## 2019-02-21 MED ORDER — PREDNISONE 10 MG PO TABS: ORAL_TABLET | ORAL | 0 refills | Status: DC

## 2019-02-21 NOTE — Progress Notes (Signed)
Subjective:    Patient ID: Bianca Mueller, female    DOB: 1926-01-11, 85 y.o.   MRN: [id number]  HPI Here with a new rash over back/shoulders and L arm   It started yesterday -rash with itching on back  Family gave her zyrtec and benadryl  It helped a lot   Woke up early this am- itching and burning   No new exposures  Dove soap Gain laundry detergent  No fabric softener  Body lotion-perhaps suave  No perfume  No powder    No animals in the house  Did sit outside one day last week   Medicines are not new -all the same   Diet is the same  Last strawberries few weeks ago  No mangos or shellfish   No mouth or throat swelling   Patient Active Problem List   Diagnosis Date Noted  . Rash and nonspecific skin eruption 02/21/2019  . Aortic atherosclerosis ([place]) 11/06/2018  . Falls frequently 08/08/2018  . Left-sided chest wall pain 07/17/2018  . Hypothyroid 02/28/2018  . Dementia, senile ([place]) 01/16/2018  . Medicare annual wellness visit, subsequent 10/24/2017  . Insomnia disorder 08/15/2017  . Long term (current) use of anticoagulants 06/26/2017  . At high risk for falls 11/16/2016  . Routine general medical examination at a health care facility 08/03/2016  . Anxiety 11/08/2015  . Gait disorder 09/21/2015  . History of fall 08/24/2015  . Hearing loss 08/24/2015  . Acute on chronic diastolic heart failure ([place]) 08/07/2014  . Encounter for therapeutic drug monitoring 08/06/2014  . Chronic diastolic CHF (congestive heart failure) ([place])   . CAD (coronary artery disease)   . Atrial fibrillation ([place] [employer])   . Transaminitis 07/20/2014  . Atrial flutter ([place]) 07/20/2014  . NSTEMI (non-ST elevated myocardial infarction) ([place]) 07/18/2014  . Encounter for Medicare annual wellness exam 08/19/2013  . Skin tear of lower leg without complication, initial encounter 03/16/2013  . Other screening mammogram 05/17/2012  . Adult failure to thrive 05/17/2011  . Diabetes mellitus  ([place]) 02/14/2011  . ANEMIA, VITAMIN B12 DEFICIENCY 09/26/2010  . PEDAL EDEMA 03/16/2010  . [place] ALLERGY 09/06/2009  . Vitamin D deficiency 03/02/2009  . DIVERTICULITIS, COLON 11/12/2008  . COLONIC POLYPS, HX OF 09/02/2008  . HYPERCHOLESTEROLEMIA 12/10/2006  . Essential hypertension 12/10/2006  . Osteoporosis 03/18/2002   Past Medical History:  Diagnosis Date  . Arthritis    "hips" (08/07/2014)  . CAD (coronary artery disease)    a. 1989 s/p MI, TPA, PTCA;  b. 06/2014 NSTEMI/Cath: LM nl, LAD 30-40, D2 90, small, LCX 125m OM1 90, RCA Cs2+ 70-80p, 50-69m90d (? ruptured plaque), EF 55-60%-->Med Rx.  . Chronic diastolic CHF (congestive heart failure) ([facility]   a. 07/2014 Echo: EF 60-65%, Gr 1 DD, mild to mod TR, PASP 3758m.  . Diverticulosis   . Elevated LFTs    a. 06/2014 Abd U/S: no GB pathology.  . GMarland KitchenRD (gastroesophageal reflux disease)   . Hx of adenomatous colonic polyps 2010   last colonscopy 2010,Dr EllVira Agarno need for further  colonoscopy given age  . Hypercalcemia   . Hyperlipidemia   . Hypertension   . Myocardial infarction ([facility]  . Osteoporosis   . PAF/PAFlutter    a. 06/2014->amio and coumadin started.  . Type II diabetes mellitus ([place] . Wears dentures    partial lower  . Zoster    past   Past Surgical History:  Procedure Laterality Date  . CARDIAC CATHETERIZATION  07/22/2014  . CATARACT EXTRACTION W/PHACO Right 03/18/2018   Procedure: CATARACT EXTRACTION PHACO AND INTRAOCULAR LENS PLACEMENT ([place]) RIGHT DIABETES;  Surgeon: Eulogio Bear, MD;  Location: [facility];  Service: Ophthalmology;  Laterality: Right;  Diabetes - oral meds  . CORONARY ANGIOPLASTY  1989   "@ [facility]"  . LEFT HEART CATHETERIZATION WITH CORONARY ANGIOGRAM N/A 07/22/2014   Procedure: LEFT HEART CATHETERIZATION WITH CORONARY ANGIOGRAM;  Surgeon: Sinclair Grooms, MD;  Location: [facility] CATH LAB;  Service: Cardiovascular;  Laterality: N/A;  . SHOULDER OPEN ROTATOR CUFF  REPAIR Right 1980's  . VAGINAL HYSTERECTOMY  1970's?   ovaries intact   Social History   Tobacco Use  . Smoking status: Former Smoker    Packs/day: 0.25    Years: 30.00    Pack years: 7.50    Types: Cigarettes  . Smokeless tobacco: Never Used  . Tobacco comment: "quit smoking in the 1980's"  Substance Use Topics  . Alcohol use: No    Alcohol/week: 0.0 standard drinks  . Drug use: No   Family History  Problem Relation Age of Onset  . Heart disease Mother        CAD  . Heart disease Father        CAD  . Heart disease Brother        CAD   Allergies  Allergen Reactions  . Alendronate Sodium Other (See Comments)    unknown  . Doxycycline Other (See Comments)    Inc LFT  . Isosorbide Nitrate Other (See Comments)    Doesn't remember reaction  . Lidocaine Nausea Only    Given at Dentist office  . Metformin And Related     Decreases appetite resulting in weight loss  . Metronidazole Nausea And Vomiting    REACTION: sick/ GI  . Nifedipine Other (See Comments)    Doesn't remember reaction   Current Outpatient Medications on File Prior to Visit  Medication Sig Dispense Refill  . ACCU-CHEK FASTCLIX LANCETS MISC Use as instructed to test blood sugar once daily or as needed.  Diagnosis:  E11.9   Non insulin-dependent. 102 each 4  . ALPRAZolam (XANAX) 0.25 MG tablet Take 0.25 mg by mouth as needed.    Marland Kitchen amiodarone (PACERONE) 200 MG tablet TAKE 1 TABLET TWICE DAILY (Patient taking differently: Take 200 mg by mouth daily. ) 180 tablet 0  . Blood Glucose Monitoring Suppl (ACCU-CHEK AVIVA PLUS) W/DEVICE KIT Use as instructed to test blood sugar once daily or as needed.  Diagnosis:  E11.9  Non insulin-dependent. 1 kit 0  . busPIRone (BUSPAR) 30 MG tablet TAKE 1 TABLET TWICE DAILY 180 tablet 1  . cholecalciferol (VITAMIN D) 1000 UNITS tablet Take 2,000 Units by mouth daily at 12 noon.     . clonazePAM (KLONOPIN) 0.5 MG tablet Take 1 tablet (0.5 mg total) by mouth at bedtime. 90 tablet  0  . cloNIDine (CATAPRES) 0.2 MG tablet TAKE 1 TABLET TWICE DAILY 180 tablet 1  . diphenhydrAMINE (BENADRYL) 25 MG tablet Take 25 mg by mouth at bedtime as needed.    . docusate sodium 100 MG CAPS Take 100 mg by mouth 2 (two) times daily. (Patient taking differently: Take 100 mg by mouth 3 (three) times daily. ) 10 capsule 0  . doxazosin (CARDURA) 4 MG tablet TAKE 1 TABLET TWICE DAILY 180 tablet 0  . furosemide (LASIX) 40 MG tablet TAKE 1 TABLET DAILY. AS DIRECTED BY CARDIOLOGIST,  MAY TAKE ADDITIONAL DOSES AS NEEDED 180 tablet  2  . glipiZIDE (GLUCOTROL XL) 2.5 MG 24 hr tablet TAKE 1 TABLET DAILY WITH LUNCH. 90 tablet 1  . glucose blood (ACCU-CHEK AVIVA PLUS) test strip USE AS INSTRUCTED TO CHECK BLOOD SUGAR ONCE DAILY OR AS NEEDED.  Dx code E11.59 100 each 3  . hydrALAZINE (APRESOLINE) 100 MG tablet TAKE 1 TABLET THREE TIMES DAILY 270 tablet 1  . JANUVIA 100 MG tablet TAKE 1 TABLET (100 MG TOTAL) BY MOUTH DAILY. 90 tablet 3  . levothyroxine (SYNTHROID, LEVOTHROID) 25 MCG tablet TAKE 1 TABLET EVERY DAY BEFORE BREAKFAST 90 tablet 1  . lisinopril (PRINIVIL,ZESTRIL) 20 MG tablet TAKE 1 TABLET EVERY DAY 90 tablet 1  . nitroGLYCERIN (NITROSTAT) 0.4 MG SL tablet Place 1 tablet (0.4 mg total) under the tongue every 5 (five) minutesas needed for chest pain. If not improved seek medical attention in ER 25 tablet 1  . omeprazole (PRILOSEC) 20 MG capsule Take 1 capsule (20 mg total) by mouth daily. 90 capsule 3  . sertraline (ZOLOFT) 50 MG tablet Take 1 tablet (50 mg total) by mouth daily. 90 tablet 3  . warfarin (COUMADIN) 3 MG tablet Take 1 tablet daily or TAKE AS DIRECTED BY ANTI COAGULATION CLINIC.  90 day 90 tablet 1   No current facility-administered medications on file prior to visit.     Review of Systems  Constitutional: Negative for activity change, appetite change, fatigue, fever and unexpected weight change.  HENT: Negative for congestion, ear pain, rhinorrhea, sinus pressure and sore throat.    Eyes: Negative for pain, redness and visual disturbance.  Respiratory: Negative for cough, shortness of breath and wheezing.   Cardiovascular: Negative for chest pain and palpitations.  Gastrointestinal: Negative for abdominal pain, blood in stool, constipation and diarrhea.  Endocrine: Negative for polydipsia and polyuria.  Genitourinary: Negative for dysuria, frequency and urgency.  Musculoskeletal: Negative for arthralgias, back pain and myalgias.  Skin: Positive for rash. Negative for pallor.  Allergic/Immunologic: Negative for environmental allergies.  Neurological: Negative for dizziness, syncope and headaches.  Hematological: Negative for adenopathy. Does not bruise/bleed easily.  Psychiatric/Behavioral: Negative for decreased concentration and dysphoric mood. The patient is not nervous/anxious.        Dementia       Objective:   Physical Exam Constitutional:      General: She is not in acute distress.    Appearance: Normal appearance. She is normal weight. She is not ill-appearing.  HENT:     Mouth/Throat:     Mouth: Mucous membranes are moist.     Pharynx: Oropharynx is clear. No posterior oropharyngeal erythema.     Comments: No mouth or throat swelling  Eyes:     General: No scleral icterus.       Right eye: No discharge.        Left eye: No discharge.     Extraocular Movements: Extraocular movements intact.     Conjunctiva/sclera: Conjunctivae normal.     Pupils: Pupils are equal, round, and reactive to light.  Neck:     Musculoskeletal: Normal range of motion and neck supple. No neck rigidity.  Cardiovascular:     Rate and Rhythm: Normal rate and regular rhythm.  Pulmonary:     Effort: Pulmonary effort is normal. No respiratory distress.     Breath sounds: Normal breath sounds. No stridor. No wheezing, rhonchi or rales.  Lymphadenopathy:     Cervical: No cervical adenopathy.  Skin:    General: Skin is warm and dry.  Findings: Rash present.     Comments:  Mildly erythematous rash with small papules but no hives or confluent induration No vesicles  No drainage Few excoriations  No scale  Neurological:     Mental Status: She is alert. Mental status is at baseline.  Psychiatric:        Mood and Affect: Mood normal.           Assessment & Plan:   Problem List Items Addressed This Visit      Musculoskeletal and Integument   Rash and nonspecific skin eruption - Primary    Upper back and chest primarily with no new exposures noted- medication/food/products  Resembles an allergic rash  Continue antihistamines  Px prednisone 30 mg taper  Rev products-will change to scent free and avoid fabric softener Update if not starting to improve in a week or if worsening

## 2019-02-21 NOTE — Telephone Encounter (Signed)
Lugene left v/m that pt has a red,angry,burning &itching rash on shoulders and back.No blisters seen.pt already has in office appt with Dr Glori Bickers today at 2:45. Left v/m for Lugene to cb if needed since pt already has appt today at 2:45. FYI to Dr Glori Bickers.

## 2019-02-21 NOTE — Patient Instructions (Addendum)
Try a "free" detergent   Unscented body lotion please  No fabric softener Watch sun exposure - stick to the shade Stay cool   Zyrtec and benadryl are ok   Take the prednisone as directed

## 2019-02-23 NOTE — Assessment & Plan Note (Signed)
Upper back and chest primarily with no new exposures noted- medication/food/products  Resembles an allergic rash  Continue antihistamines  Px prednisone 30 mg taper  Rev products-will change to scent free and avoid fabric softener Update if not starting to improve in a week or if worsening

## 2019-02-26 ENCOUNTER — Telehealth (INDEPENDENT_AMBULATORY_CARE_PROVIDER_SITE_OTHER): Payer: Medicare HMO | Admitting: Cardiovascular Disease

## 2019-02-26 ENCOUNTER — Telehealth: Payer: Self-pay | Admitting: Family Medicine

## 2019-02-26 ENCOUNTER — Other Ambulatory Visit: Payer: Self-pay

## 2019-02-26 DIAGNOSIS — I10 Essential (primary) hypertension: Secondary | ICD-10-CM | POA: Diagnosis not present

## 2019-02-26 DIAGNOSIS — I4891 Unspecified atrial fibrillation: Secondary | ICD-10-CM

## 2019-02-26 DIAGNOSIS — I4892 Unspecified atrial flutter: Secondary | ICD-10-CM

## 2019-02-26 DIAGNOSIS — I5032 Chronic diastolic (congestive) heart failure: Secondary | ICD-10-CM

## 2019-02-26 DIAGNOSIS — E7800 Pure hypercholesterolemia, unspecified: Secondary | ICD-10-CM | POA: Diagnosis not present

## 2019-02-26 DIAGNOSIS — I25118 Atherosclerotic heart disease of native coronary artery with other forms of angina pectoris: Secondary | ICD-10-CM

## 2019-02-26 MED ORDER — FUROSEMIDE 40 MG PO TABS: 40.0000 mg | ORAL_TABLET | Freq: Every day | ORAL | 3 refills | Status: AC

## 2019-02-26 MED ORDER — POTASSIUM CHLORIDE ER 10 MEQ PO TBCR: 10.0000 meq | EXTENDED_RELEASE_TABLET | Freq: Every day | ORAL | 3 refills | Status: AC

## 2019-02-26 MED ORDER — AMIODARONE HCL 200 MG PO TABS: 200.0000 mg | ORAL_TABLET | Freq: Every day | ORAL | 3 refills | Status: AC

## 2019-02-26 NOTE — Progress Notes (Signed)
Virtual Visit via Telephone Note   This visit type was conducted due to national recommendations for restrictions regarding the COVID-19 Pandemic (e.g. social distancing) in an effort to limit this patient's exposure and mitigate transmission in our community.  Due to her co-morbid illnesses, this patient is at least at moderate risk for complications without adequate follow up.  This format is felt to be most appropriate for this patient at this time.  The patient did not have access to video technology/had technical difficulties with video requiring transitioning to audio format only (telephone).  All issues noted in this document were discussed and addressed.  No physical exam could be performed with this format.  Please refer to the patient's chart for her  consent to telehealth for [facility].   I connected with  Jackie Plum on 02/26/19 by a video enabled telemedicine application and verified that I am speaking with the correct person using two identifiers. I discussed the limitations of evaluation and management by telemedicine. The patient expressed understanding and agreed to proceed.   Evaluation Performed:  Follow-up visit  Date:  02/26/2019   ID:  MELORA MENON, DOB December 07, 1925, MRN [id number]  Patient Location:  [place] [address]   Provider location:   [facility], [place] office  PCP:  Abner Greenspan, MD  Cardiologist:  Arvid Right [facility]   Chief Complaint:  Face swelling, abscess?   History of Present Illness:    DEZZIE BADILLA is a 85 y.o. female who presents via audio/video conferencing for a telehealth visit today.   The patient does not symptoms concerning for COVID-19 infection (fever, chills, cough, or new SHORTNESS OF BREATH).   Patient has a past medical history of CAD non-ST elevation MI,  three-vessel disease on catheterization in 5364,  complicated by paroxysmal atrial fibrillation and flutter  Prior non-ST  elevation MI October 2015  labile hypertension On a prior clinic visit, she reported having episode of syncope.  30 day monitor was ordered, This showed no significant arrhythmia who presents for follow-up of her coronary artery disease and atrial flutter.   Swelling on side of her face Called Dr. Glori Bickers  Has a caregiver, No swelling in feet legs Breathing ok She is weak, per the daughter,   This AM dizzy this Am, Thinks it is probably secondary to her facial swelling  Blood pressure good Lasix 40 mg daily  Denies significant shortness of breath  On prior office visit she reported husband disabled lives with her at home He had accident, fell off ATV, broke both his legs now no longer able to ambulate  Lab work reviewed with her in detail HBA1C 6.6 LDL 177  Long discussion concerning previous use of statin Elevated LFTs on pravastatin in the past, improved by holding the pravastatin Was on Lipitor but this seemed to fall off her list  Other past medical history reviewed Previous Stress test was ordered that showed no ischemia.  30 day monitor results no significant arrhythmia, only normal sinus rhythm  She was started on ranexa on previous clinic visit, reports she is unable to afford this  may have helped, given her a little bit more energy  She did have a fall, fell between the bed and dresser, hurt her head Feels it was from her balance  Previous fall as she was getting out of her car in the garage Feels her feet slipped, did not have good balance, went down  and hurt her ribs Denies any loss of consciousness, near syncope or dizziness no further falls since that time Reports that her balance is okay, does not need a walker  hemoglobin A1c 7.4, total cholesterol 150  In the hospital she had atrial fibrillation/flutter with RVR, started on diltiazem and amiodarone cardiac catheterization showing severe diagonal, circumflex OM1, and RCA disease,  medical management recommended Started on Coumadin and Plavix follow-up in clinic she was still in atrial flutter with rate 86 bpm, normal sinus rhythm on her last clinic visit  Previous 2-D echocardiogram showed normal LV function.     Prior CV studies:   The following studies were reviewed today:    Past Medical History:  Diagnosis Date  . Arthritis    "hips" (08/07/2014)  . CAD (coronary artery disease)    a. 1989 s/p MI, TPA, PTCA;  b. 06/2014 NSTEMI/Cath: LM nl, LAD 30-40, D2 90, small, LCX 130m OM1 90, RCA Cs2+ 70-80p, 50-61m90d (? ruptured plaque), EF 55-60%-->Med Rx.  . Chronic diastolic CHF (congestive heart failure) (HCLipan   a. 07/2014 Echo: EF 60-65%, Gr 1 DD, mild to mod TR, PASP 3726m.  . Diverticulosis   . Elevated LFTs    a. 06/2014 Abd U/S: no GB pathology.  . GMarland KitchenRD (gastroesophageal reflux disease)   . Hx of adenomatous colonic polyps 2010   last colonscopy 2010,Dr EllVira Agarno need for further  colonoscopy given age  . Hypercalcemia   . Hyperlipidemia   . Hypertension   . Myocardial infarction (HCCSelma989  . Osteoporosis   . PAF/PAFlutter    a. 06/2014->amio and coumadin started.  . Type II diabetes mellitus ([place]CC[place] . Wears dentures    partial lower  . Zoster    past   Past Surgical History:  Procedure Laterality Date  . CARDIAC CATHETERIZATION  07/22/2014  . CATARACT EXTRACTION W/PHACO Right 03/18/2018   Procedure: CATARACT EXTRACTION PHACO AND INTRAOCULAR LENS PLACEMENT (IOCEleanorIGHT DIABETES;  Surgeon: KinEulogio BearD;  [facility];  Laterality: Right;  Diabetes - oral meds  . CORONARY ANGIOPLASTY  1989   "@ [facility]"  . LEFT HEART CATHETERIZATION WITH CORONARY ANGIOGRAM N/A 07/22/2014   Procedure: LEFT HEART CATHETERIZATION WITH CORONARY ANGIOGRAM;  Surgeon: HenSinclair GroomsD;  [facility];  Service: Cardiovascular;  Laterality: N/A;  . SHOULDER OPEN ROTATOR CUFF REPAIR Right 1980's  .  VAGINAL HYSTERECTOMY  1970's?   ovaries intact     No outpatient medications have been marked as taking for the 02/26/19 encounter (Telemedicine) with GolMinna MerrittsD.     Allergies:   Alendronate sodium; Doxycycline; Isosorbide nitrate; Lidocaine; Metformin and related; Metronidazole; and Nifedipine   Social History   Tobacco Use  . Smoking status: Former Smoker    Packs/day: 0.25    Years: 30.00    Pack years: 7.50    Types: Cigarettes  . Smokeless tobacco: Never Used  . Tobacco comment: "quit smoking in the 1980's"  Substance Use Topics  . Alcohol use: No    Alcohol/week: 0.0 standard drinks  . Drug use: No     Current Outpatient Medications on File Prior to Visit  Medication Sig Dispense Refill  . ACCU-CHEK FASTCLIX LANCETS MISC Use as instructed to test blood sugar once daily or as needed.  Diagnosis:  E11.9   Non insulin-dependent. 102 each 4  . ALPRAZolam (XANAX) 0.25 MG tablet Take 0.25 mg by mouth as needed.    .Marland Kitchen  amiodarone (PACERONE) 200 MG tablet TAKE 1 TABLET TWICE DAILY (Patient taking differently: Take 200 mg by mouth daily. ) 180 tablet 0  . Blood Glucose Monitoring Suppl (ACCU-CHEK AVIVA PLUS) W/DEVICE KIT Use as instructed to test blood sugar once daily or as needed.  Diagnosis:  E11.9  Non insulin-dependent. 1 kit 0  . busPIRone (BUSPAR) 30 MG tablet TAKE 1 TABLET TWICE DAILY 180 tablet 1  . cholecalciferol (VITAMIN D) 1000 UNITS tablet Take 2,000 Units by mouth daily at 12 noon.     . clonazePAM (KLONOPIN) 0.5 MG tablet Take 1 tablet (0.5 mg total) by mouth at bedtime. 90 tablet 0  . cloNIDine (CATAPRES) 0.2 MG tablet TAKE 1 TABLET TWICE DAILY 180 tablet 1  . diphenhydrAMINE (BENADRYL) 25 MG tablet Take 25 mg by mouth at bedtime as needed.    . docusate sodium 100 MG CAPS Take 100 mg by mouth 2 (two) times daily. (Patient taking differently: Take 100 mg by mouth 3 (three) times daily. ) 10 capsule 0  . doxazosin (CARDURA) 4 MG tablet TAKE 1 TABLET TWICE  DAILY 180 tablet 0  . furosemide (LASIX) 40 MG tablet TAKE 1 TABLET DAILY. AS DIRECTED BY CARDIOLOGIST,  MAY TAKE ADDITIONAL DOSES AS NEEDED 180 tablet 2  . glipiZIDE (GLUCOTROL XL) 2.5 MG 24 hr tablet TAKE 1 TABLET DAILY WITH LUNCH. 90 tablet 1  . glucose blood (ACCU-CHEK AVIVA PLUS) test strip USE AS INSTRUCTED TO CHECK BLOOD SUGAR ONCE DAILY OR AS NEEDED.  Dx code E11.59 100 each 3  . hydrALAZINE (APRESOLINE) 100 MG tablet TAKE 1 TABLET THREE TIMES DAILY 270 tablet 1  . JANUVIA 100 MG tablet TAKE 1 TABLET (100 MG TOTAL) BY MOUTH DAILY. 90 tablet 3  . levothyroxine (SYNTHROID, LEVOTHROID) 25 MCG tablet TAKE 1 TABLET EVERY DAY BEFORE BREAKFAST 90 tablet 1  . lisinopril (PRINIVIL,ZESTRIL) 20 MG tablet TAKE 1 TABLET EVERY DAY 90 tablet 1  . nitroGLYCERIN (NITROSTAT) 0.4 MG SL tablet Place 1 tablet (0.4 mg total) under the tongue every 5 (five) minutesas needed for chest pain. If not improved seek medical attention in ER 25 tablet 1  . omeprazole (PRILOSEC) 20 MG capsule Take 1 capsule (20 mg total) by mouth daily. 90 capsule 3  . predniSONE (DELTASONE) 10 MG tablet Take 3 pills once daily by mouth for 3 days, then 2 pills once daily for 3 days, then 1 pill once daily for 3 days and then stop 18 tablet 0  . sertraline (ZOLOFT) 50 MG tablet Take 1 tablet (50 mg total) by mouth daily. 90 tablet 3  . warfarin (COUMADIN) 3 MG tablet Take 1 tablet daily or TAKE AS DIRECTED BY ANTI COAGULATION CLINIC.  90 day 90 tablet 1   No current facility-administered medications on file prior to visit.      Family Hx: The patient's family history includes Heart disease in her brother, father, and mother.  ROS:   Please see the history of present illness.    Review of Systems  Constitutional: Negative.   Respiratory: Negative.   Cardiovascular: Negative.   Gastrointestinal: Negative.   Musculoskeletal: Negative.   Neurological: Negative.   Psychiatric/Behavioral: Negative.   All other systems reviewed and  are negative.    Labs/Other Tests and Data Reviewed:    Recent Labs: 10/30/2018: ALT 36; BUN 16; Creatinine, Ser 0.97; Hemoglobin 13.2; Platelets 226.0; Potassium 3.6; Sodium 140; TSH 4.62   Recent Lipid Panel Lab Results  Component Value Date/Time   CHOL 218 (  H) 10/30/2018 11:58 AM   TRIG 301.0 (H) 10/30/2018 11:58 AM   HDL 31.30 (L) 10/30/2018 11:58 AM   CHOLHDL 7 10/30/2018 11:58 AM   LDLCALC 96 01/04/2016 08:35 AM   LDLDIRECT 152.0 10/30/2018 11:58 AM    Wt Readings from Last 3 Encounters:  02/21/19 122 lb (55.3 kg)  11/06/18 122 lb 8 oz (55.6 kg)  10/30/18 122 lb 8 oz (55.6 kg)     Exam:    Vital Signs: Vital signs may also be detailed in the HPI There were no vitals taken for this visit.  Wt Readings from Last 3 Encounters:  02/21/19 122 lb (55.3 kg)  11/06/18 122 lb 8 oz (55.6 kg)  10/30/18 122 lb 8 oz (55.6 kg)   Temp Readings from Last 3 Encounters:  02/21/19 98.2 F (36.8 C) (Oral)  11/06/18 (!) 97.3 F (36.3 C) (Oral)  10/30/18 97.7 F (36.5 C) (Oral)   BP Readings from Last 3 Encounters:  02/21/19 122/80  11/06/18 126/74  10/30/18 132/70   Pulse Readings from Last 3 Encounters:  02/21/19 82  11/06/18 86  10/30/18 75     Well nourished, well developed female in no acute distress. Constitutional:  oriented to person, place, and time. No distress.  Head: Normocephalic and atraumatic.  Eyes:  no discharge. No scleral icterus.  Neck: Normal range of motion. Neck supple.  Pulmonary/Chest: No audible wheezing, no distress, appears comfortable Musculoskeletal: Normal range of motion.  no  tenderness or deformity.  Neurological:   Coordination normal. Full exam not performed Skin:  No rash Psychiatric:  normal mood and affect. behavior is normal. Thought content normal.    ASSESSMENT & PLAN:    Chronic diastolic CHF (congestive heart failure) (HCC) Euvolemic per today's discussion, on Lasix 40 daily No changes to the medications  Coronary  artery disease of native artery of native heart with stable angina pectoris (HCC) Currently with no symptoms of angina. No further workup at this time. Continue current medication regimen.  HYPERCHOLESTEROLEMIA Was previously on a statin, had side effects Will not restart Lipitor  Essential hypertension Blood pressure is well controlled on today's visit. No changes made to the medications.  Atrial flutter, unspecified type (HCC)  Atrial fibrillation, unspecified type ([place]) Tolerating warfarin She reports rate well controlled  COVID-19 Education: The signs and symptoms of COVID-19 were discussed with the patient and how to seek care for testing (follow up with PCP or arrange E-visit).  The importance of social distancing was discussed today.  Patient Risk:   After full review of this patients clinical status, I feel that they are at least moderate risk at this time.  Time:   Today, I have spent 25 minutes with the patient with telehealth technology discussing the cardiac and medical problems/diagnoses detailed above   10 min spent reviewing the chart prior to patient visit today   Medication Adjustments/Labs and Tests Ordered: Current medicines are reviewed at length with the patient today.  Concerns regarding medicines are outlined above.   Tests Ordered: No tests ordered   Medication Changes: No changes made   Disposition: Follow-up in 6 months   Signed, Ida Rogue, MD  02/26/2019 10:51 AM    [facility] [address] [place] [address], [place], [place] [address]

## 2019-02-26 NOTE — Telephone Encounter (Signed)
Aware-thanks for letting me know  

## 2019-02-26 NOTE — Telephone Encounter (Signed)
Patient's Daughter [name] called today in regards to patient. She stated she spoke with the caregiver this morning and stated the patient's face is swollen and believe it may be from a tooth abscess.  They were not sure if they needed to have the patient seen in our office or if she needs to see her dentist.  Daughter is requesting a call back    Patient's Daughter's Phone- [phone]

## 2019-02-26 NOTE — Telephone Encounter (Signed)
Is she also having pain and dental sensitivity?  Does she have a dentist?

## 2019-02-26 NOTE — Patient Instructions (Addendum)
Medication Instructions:  Your physician has recommended you make the following change in your medication:  1. START Potassium 10 mEq once daily 2. CONTINUE Furosemide 40 mg once daily 3. DECREASE Amiodarone 200 mg to once daily   If you need a refill on your cardiac medications before your next appointment, please call your pharmacy.    Lab work: No new labs needed   If you have labs (blood work) drawn today and your tests are completely normal, you will receive your results only by: Marland Kitchen MyChart Message (if you have MyChart) OR . A paper copy in the mail If you have any lab test that is abnormal or we need to change your treatment, we will call you to review the results.   Testing/Procedures: No new testing needed   Follow-Up: At [facility], you and your health needs are our priority.  As part of our continuing mission to provide you with exceptional heart care, we have created designated Provider Care Teams.  These Care Teams include your primary Cardiologist (physician) and Advanced Practice Providers (APPs -  Physician Assistants and Nurse Practitioners) who all work together to provide you with the care you need, when you need it.  . You will need a follow up appointment in 6 months .   Please call our office 2 months in advance to schedule this appointment.    . Providers on your designated Care Team:   . Murray Hodgkins, NP . Christell Faith, PA-C . Marrianne Mood, PA-C  Any Other Special Instructions Will Be Listed Below (If Applicable).  For educational health videos Log in to : [email or web address] Or : [email or web address], password : [email or web address]

## 2019-02-26 NOTE — Telephone Encounter (Signed)
Spoke with Lugene, she said pt was in pain this am when she tried to eat her breakfast but since then she hasn't had anymore pain, Lugene got her an appt with her dentist tomorrow morning at 10am so she said pt should be fine. At 1st she was calling to see if it was a good idea to try and start her on an abx but she said since pt isn't having anymore pain she said we can hold off on abx and she will just keep dentist appt in the morning  FYI to PCP

## 2019-02-27 ENCOUNTER — Other Ambulatory Visit: Payer: Medicare HMO

## 2019-02-28 ENCOUNTER — Encounter: Payer: Self-pay | Admitting: Family Medicine

## 2019-02-28 MED ORDER — ONDANSETRON HCL 4 MG PO TABS: 4.0000 mg | ORAL_TABLET | Freq: Three times a day (TID) | ORAL | 1 refills | Status: AC | PRN

## 2019-02-28 MED ORDER — TRAMADOL HCL 50 MG PO TABS: 25.0000 mg | ORAL_TABLET | Freq: Three times a day (TID) | ORAL | 0 refills | Status: DC | PRN

## 2019-02-28 MED ORDER — TRAMADOL HCL 50 MG PO TABS: 25.0000 mg | ORAL_TABLET | Freq: Three times a day (TID) | ORAL | 0 refills | Status: AC | PRN

## 2019-02-28 NOTE — Telephone Encounter (Signed)
Pt's daughter called-she is nauseated from the abx her dentist gave her   Will send in zofran for nausea  inst to update me if needed

## 2019-03-01 ENCOUNTER — Other Ambulatory Visit: Payer: Self-pay | Admitting: Family Medicine

## 2019-03-01 ENCOUNTER — Encounter: Payer: Self-pay | Admitting: Family Medicine

## 2019-03-03 ENCOUNTER — Other Ambulatory Visit: Payer: Self-pay | Admitting: *Deleted

## 2019-03-03 MED ORDER — CLONIDINE HCL 0.2 MG PO TABS: 0.2000 mg | ORAL_TABLET | Freq: Two times a day (BID) | ORAL | 0 refills | Status: DC

## 2019-03-08 ENCOUNTER — Encounter: Payer: Self-pay | Admitting: Family Medicine

## 2019-03-10 ENCOUNTER — Other Ambulatory Visit: Payer: Self-pay | Admitting: *Deleted

## 2019-03-10 MED ORDER — SERTRALINE HCL 50 MG PO TABS: 50.0000 mg | ORAL_TABLET | Freq: Every day | ORAL | 1 refills | Status: AC

## 2019-03-10 MED ORDER — SITAGLIPTIN PHOSPHATE 100 MG PO TABS: ORAL_TABLET | ORAL | 3 refills | Status: AC

## 2019-03-10 MED ORDER — WARFARIN SODIUM 3 MG PO TABS: ORAL_TABLET | ORAL | 1 refills | Status: DC

## 2019-03-10 NOTE — Telephone Encounter (Signed)
Daughter rechecked mail order and they don't have the refills on file for either med. Med refilled and Lugene aware

## 2019-03-13 ENCOUNTER — Ambulatory Visit (INDEPENDENT_AMBULATORY_CARE_PROVIDER_SITE_OTHER): Payer: Medicare HMO | Admitting: General Practice

## 2019-03-13 ENCOUNTER — Other Ambulatory Visit: Payer: Medicare HMO

## 2019-03-13 ENCOUNTER — Other Ambulatory Visit (INDEPENDENT_AMBULATORY_CARE_PROVIDER_SITE_OTHER): Payer: Medicare HMO

## 2019-03-13 DIAGNOSIS — I4891 Unspecified atrial fibrillation: Secondary | ICD-10-CM | POA: Diagnosis not present

## 2019-03-13 DIAGNOSIS — Z7901 Long term (current) use of anticoagulants: Secondary | ICD-10-CM

## 2019-03-13 LAB — POCT INR: INR: 1.5 — AB (ref 2.0–3.0)

## 2019-03-13 NOTE — Patient Instructions (Addendum)
Pre visit review using our clinic review tool, if applicable. No additional management support is needed unless otherwise documented below in the visit note.  Take 1 1/2 tablets today and tomorrow and then continue to take 3mg  daily EXCEPT for 1.5mg  on Fridays and 0mg  on Mondays.  Recheck in 2 weeks.  Daughter, Lugene notified of results and instructions for recheck.

## 2019-03-15 ENCOUNTER — Other Ambulatory Visit: Payer: Self-pay | Admitting: Family Medicine

## 2019-03-15 DIAGNOSIS — I10 Essential (primary) hypertension: Secondary | ICD-10-CM

## 2019-03-17 ENCOUNTER — Other Ambulatory Visit: Payer: Self-pay | Admitting: Family Medicine

## 2019-03-26 ENCOUNTER — Telehealth: Payer: Self-pay

## 2019-03-26 NOTE — Telephone Encounter (Signed)
That sounds like a good plan. Thanks

## 2019-03-26 NOTE — Telephone Encounter (Signed)
Spoke with daughter regarding increased difficulty with getting mom to routine INR appointments.  Also, in light of COVID pandemic risk, we discussed getting patient set up with an in home meter.  I spoke with Elta Guadeloupe ([employer]) home monitoring program and began paperwork proceedings to get patient a home meter and initiate training.  Will be looking for fax order form to have PCP sign and return for future set up.  Daughter is aware and thanks [place] for the effort.  FYI to Villa Herb, RN and PCP Dr. Glori Bickers.

## 2019-03-27 ENCOUNTER — Ambulatory Visit (INDEPENDENT_AMBULATORY_CARE_PROVIDER_SITE_OTHER): Payer: Medicare HMO | Admitting: General Practice

## 2019-03-27 DIAGNOSIS — I4892 Unspecified atrial flutter: Secondary | ICD-10-CM

## 2019-03-27 DIAGNOSIS — I4891 Unspecified atrial fibrillation: Secondary | ICD-10-CM

## 2019-03-27 DIAGNOSIS — Z7901 Long term (current) use of anticoagulants: Secondary | ICD-10-CM | POA: Diagnosis not present

## 2019-03-27 LAB — POCT INR: INR: 2.2 (ref 2.0–3.0)

## 2019-03-27 NOTE — Patient Instructions (Addendum)
Pre visit review using our clinic review tool, if applicable. No additional management support is needed unless otherwise documented below in the visit note.  Continue to take 3mg  daily EXCEPT for 1.5mg  on Fridays and 0mg  on Mondays.  Recheck in 4 weeks.  Daughter, Lugene notified of results and instructions for recheck.

## 2019-04-14 ENCOUNTER — Encounter: Payer: Self-pay | Admitting: Family Medicine

## 2019-04-16 ENCOUNTER — Other Ambulatory Visit: Payer: Self-pay | Admitting: Family Medicine

## 2019-04-16 NOTE — Telephone Encounter (Signed)
Noted.  I did call Bianca Mueller to confirm that he received everything needed for order and her verified all information.   Thanks.

## 2019-04-16 NOTE — Telephone Encounter (Signed)
Name of Medication: Rosalia Name of Pharmacy: [facility] or Written Date and Quantity: 01/14/19 #90 tabs with 0 refills Last Office Visit and Type: CPE on 11/06/18 Next Office Visit and Type: 6 Mo F/u on 05/08/19 :

## 2019-04-16 NOTE — Telephone Encounter (Signed)
Mark with [facility] left v/m requesting pts home address in reference to home INR testing. I spoke with Elta Guadeloupe and gave pts home address in chart. Elta Guadeloupe said will finish paperwork and will contact [place] with update about getting the home INR testing equipment in pts home. FYI to [facility] RN and Dr Glori Bickers.

## 2019-04-24 ENCOUNTER — Ambulatory Visit: Payer: Medicare HMO

## 2019-05-01 ENCOUNTER — Ambulatory Visit (INDEPENDENT_AMBULATORY_CARE_PROVIDER_SITE_OTHER): Payer: Medicare HMO | Admitting: General Practice

## 2019-05-01 DIAGNOSIS — Z7901 Long term (current) use of anticoagulants: Secondary | ICD-10-CM

## 2019-05-01 DIAGNOSIS — I4891 Unspecified atrial fibrillation: Secondary | ICD-10-CM

## 2019-05-01 LAB — POCT INR: INR: 2 (ref 2.0–3.0)

## 2019-05-01 NOTE — Patient Instructions (Addendum)
Pre visit review using our clinic review tool, if applicable. No additional management support is needed unless otherwise documented below in the visit note.  Continue to take 3mg  daily EXCEPT for 1.5mg  on Fridays and 0mg  on Mondays.  Pt is starting home monitoring in the next week or so.  Dosing instructions given to daughter, [name].

## 2019-05-07 ENCOUNTER — Telehealth: Payer: Self-pay | Admitting: Family Medicine

## 2019-05-07 DIAGNOSIS — E039 Hypothyroidism, unspecified: Secondary | ICD-10-CM

## 2019-05-07 DIAGNOSIS — I10 Essential (primary) hypertension: Secondary | ICD-10-CM

## 2019-05-07 DIAGNOSIS — E1159 Type 2 diabetes mellitus with other circulatory complications: Secondary | ICD-10-CM

## 2019-05-07 DIAGNOSIS — E7800 Pure hypercholesterolemia, unspecified: Secondary | ICD-10-CM

## 2019-05-07 NOTE — Telephone Encounter (Signed)
-----   Message from Cloyd Stagers, RT sent at 04/29/2019  1:41 PM EDT ----- Regarding: Lab Orders for Thursday 8.20.2020 Lab orders please for  Thursday 8.20.2020, office visit on for 6 month f/u Thursday 8.20.2020 Thank you, Dyke Maes RT(R)

## 2019-05-08 ENCOUNTER — Other Ambulatory Visit: Payer: Medicare HMO

## 2019-05-14 ENCOUNTER — Other Ambulatory Visit: Payer: Self-pay | Admitting: Family Medicine

## 2019-05-15 NOTE — Telephone Encounter (Signed)
Will route to the coumadin nurse  

## 2019-05-17 ENCOUNTER — Other Ambulatory Visit: Payer: Self-pay | Admitting: Family Medicine

## 2019-05-17 DIAGNOSIS — I480 Paroxysmal atrial fibrillation: Secondary | ICD-10-CM | POA: Diagnosis not present

## 2019-05-17 DIAGNOSIS — Z7901 Long term (current) use of anticoagulants: Secondary | ICD-10-CM | POA: Diagnosis not present

## 2019-05-17 LAB — POCT INR: INR: 2.2 (ref 2.0–3.0)

## 2019-05-19 NOTE — Telephone Encounter (Signed)
Will refill once. Lugene notified pt needs face to face appt for hospital bed, she is going to schedule f/u soon

## 2019-05-20 ENCOUNTER — Ambulatory Visit (INDEPENDENT_AMBULATORY_CARE_PROVIDER_SITE_OTHER): Payer: Medicare HMO

## 2019-05-20 DIAGNOSIS — Z7901 Long term (current) use of anticoagulants: Secondary | ICD-10-CM | POA: Diagnosis not present

## 2019-05-20 DIAGNOSIS — I4892 Unspecified atrial flutter: Secondary | ICD-10-CM

## 2019-05-20 DIAGNOSIS — I4891 Unspecified atrial fibrillation: Secondary | ICD-10-CM

## 2019-05-20 NOTE — Patient Instructions (Signed)
INR today 2.2  Continue to take 3mg  daily EXCEPT for 1.5mg  on Fridays and 0mg  on Mondays.  Pt is now on home monitoring.  Dosing instructions given to daughter, [name].

## 2019-05-21 DIAGNOSIS — G509 Disorder of trigeminal nerve, unspecified: Secondary | ICD-10-CM | POA: Diagnosis not present

## 2019-05-30 ENCOUNTER — Ambulatory Visit: Payer: Medicare HMO | Admitting: Family Medicine

## 2019-06-04 ENCOUNTER — Other Ambulatory Visit: Payer: Self-pay

## 2019-06-04 ENCOUNTER — Ambulatory Visit (INDEPENDENT_AMBULATORY_CARE_PROVIDER_SITE_OTHER): Payer: Medicare HMO | Admitting: Family Medicine

## 2019-06-04 ENCOUNTER — Encounter: Payer: Self-pay | Admitting: Family Medicine

## 2019-06-04 VITALS — BP 122/74 | HR 75 | Temp 96.0°F | Ht 61.0 in | Wt 121.2 lb

## 2019-06-04 DIAGNOSIS — I5032 Chronic diastolic (congestive) heart failure: Secondary | ICD-10-CM

## 2019-06-04 DIAGNOSIS — F0390 Unspecified dementia without behavioral disturbance: Secondary | ICD-10-CM

## 2019-06-04 DIAGNOSIS — R609 Edema, unspecified: Secondary | ICD-10-CM

## 2019-06-04 DIAGNOSIS — I10 Essential (primary) hypertension: Secondary | ICD-10-CM | POA: Diagnosis not present

## 2019-06-04 DIAGNOSIS — E1159 Type 2 diabetes mellitus with other circulatory complications: Secondary | ICD-10-CM | POA: Diagnosis not present

## 2019-06-04 DIAGNOSIS — E039 Hypothyroidism, unspecified: Secondary | ICD-10-CM

## 2019-06-04 DIAGNOSIS — R296 Repeated falls: Secondary | ICD-10-CM

## 2019-06-04 DIAGNOSIS — Z23 Encounter for immunization: Secondary | ICD-10-CM

## 2019-06-04 DIAGNOSIS — E11622 Type 2 diabetes mellitus with other skin ulcer: Secondary | ICD-10-CM | POA: Diagnosis not present

## 2019-06-04 DIAGNOSIS — E7800 Pure hypercholesterolemia, unspecified: Secondary | ICD-10-CM | POA: Diagnosis not present

## 2019-06-04 DIAGNOSIS — I25118 Atherosclerotic heart disease of native coronary artery with other forms of angina pectoris: Secondary | ICD-10-CM | POA: Diagnosis not present

## 2019-06-04 DIAGNOSIS — Z9181 History of falling: Secondary | ICD-10-CM

## 2019-06-04 LAB — LIPID PANEL
Cholesterol: 256 mg/dL — ABNORMAL HIGH (ref 0–200)
HDL: 41.7 mg/dL (ref >39.00)
NonHDL: 213.95
Total CHOL/HDL Ratio: 6
Triglycerides: 299 mg/dL — ABNORMAL HIGH (ref 0.0–149.0)
VLDL: 59.8 mg/dL — ABNORMAL HIGH (ref 0.0–40.0)

## 2019-06-04 LAB — COMPREHENSIVE METABOLIC PANEL
ALT: 37 U/L — ABNORMAL HIGH (ref 0–35)
AST: 41 U/L — ABNORMAL HIGH (ref 0–37)
Albumin: 4.1 g/dL (ref 3.5–5.2)
Alkaline Phosphatase: 51 U/L (ref 39–117)
BUN: 21 mg/dL (ref 6–23)
CO2: 28 mEq/L (ref 19–32)
Calcium: 9.8 mg/dL (ref 8.4–10.5)
Chloride: 103 mEq/L (ref 96–112)
Creatinine, Ser: 1.06 mg/dL (ref 0.40–1.20)
GFR: 48.61 mL/min — ABNORMAL LOW (ref >60.00)
Glucose, Bld: 137 mg/dL — ABNORMAL HIGH (ref 70–99)
Potassium: 3.9 mEq/L (ref 3.5–5.1)
Sodium: 141 mEq/L (ref 135–145)
Total Bilirubin: 0.5 mg/dL (ref 0.2–1.2)
Total Protein: 7.4 g/dL (ref 6.0–8.3)

## 2019-06-04 LAB — HEMOGLOBIN A1C: Hgb A1c MFr Bld: 6.3 % (ref 4.6–6.5)

## 2019-06-04 LAB — LDL CHOLESTEROL, DIRECT: Direct LDL: 179 mg/dL

## 2019-06-04 LAB — TSH: TSH: 4.75 u[IU]/mL — ABNORMAL HIGH (ref 0.35–4.50)

## 2019-06-04 NOTE — Assessment & Plan Note (Signed)
TSH today  No clinical changes  

## 2019-06-04 NOTE — Assessment & Plan Note (Signed)
bp in fair control at this time  BP Readings from Last 1 Encounters:  06/04/19 122/74   No changes needed Most recent labs reviewed  Disc lifstyle change with low sodium diet and exercise  Lab today

## 2019-06-04 NOTE — Assessment & Plan Note (Signed)
Stable  Causes ankle swelling during exacerbation  Pt requires way to elevate feet when sleeping  Hospital bed ordered

## 2019-06-04 NOTE — Assessment & Plan Note (Signed)
On/off from CHF Needs leg elevation at night  hosp bed ordered Face to face visit Medically necessary

## 2019-06-04 NOTE — Assessment & Plan Note (Signed)
Stable.  Followed by cardiology.   

## 2019-06-04 NOTE — Assessment & Plan Note (Signed)
Needs bed with rails Multiple falls incl from bed  hosp bed ordered

## 2019-06-04 NOTE — Progress Notes (Signed)
Subjective:    Patient ID: Bianca Mueller, female    DOB: 1925-12-19, 85 y.o.   MRN: [id number]  HPI Here for a face to face visit to get a hospital bed  CAPS is cover  Then medicare  Wt Readings from Last 3 Encounters:  06/04/19 121 lb 3 oz (55 kg)  02/21/19 122 lb (55.3 kg)  11/06/18 122 lb 8 oz (55.6 kg)   22.90 kg/m   She is unable to re position herself when she is in bed  It will go in her bedroom  Cannot use trapeze bar   Need rails  Hx of gait disorder and falls  Has fallen out of bed/on side of bed so needs rails  Would help her not get up w/o assist in middle of the night     Also pedal edema and CHF-need to elevate feet   Is unable to sit up and lie down without some help   Small healing pressure ulcer R lateral malleolus-sleeps on this side and may end up needing to off load  Due for labs for thyroid/DM2/ cholesterol  Flu shot given today   Patient Active Problem List   Diagnosis Date Noted  . Coronary artery disease of native artery of native heart with stable angina pectoris ([place]) 06/04/2019  . Rash and nonspecific skin eruption 02/21/2019  . Aortic atherosclerosis ([place]) 11/06/2018  . Falls frequently 08/08/2018  . Left-sided chest wall pain 07/17/2018  . Hypothyroid 02/28/2018  . Dementia, senile ([place]) 01/16/2018  . Medicare annual wellness visit, subsequent 10/24/2017  . Insomnia disorder 08/15/2017  . Long term (current) use of anticoagulants 06/26/2017  . At high risk for falls 11/16/2016  . Routine general medical examination at a health care facility 08/03/2016  . Anxiety 11/08/2015  . Gait disorder 09/21/2015  . History of fall 08/24/2015  . Hearing loss 08/24/2015  . Acute on chronic diastolic heart failure ([place]) 08/07/2014  . Encounter for therapeutic drug monitoring 08/06/2014  . Chronic diastolic CHF (congestive heart failure) ([place])   . CAD (coronary artery disease)   . Atrial fibrillation ([place])   . Transaminitis 07/20/2014  .  Atrial flutter ([place]) 07/20/2014  . NSTEMI (non-ST elevated myocardial infarction) ([place]) 07/18/2014  . Encounter for Medicare annual wellness exam 08/19/2013  . Skin tear of lower leg without complication, initial encounter 03/16/2013  . Other screening mammogram 05/17/2012  . Adult failure to thrive 05/17/2011  . Diabetes mellitus ([place]) 02/14/2011  . ANEMIA, VITAMIN B12 DEFICIENCY 09/26/2010  . PEDAL EDEMA 03/16/2010  . [place] ALLERGY 09/06/2009  . Vitamin D deficiency 03/02/2009  . DIVERTICULITIS, COLON 11/12/2008  . COLONIC POLYPS, HX OF 09/02/2008  . HYPERCHOLESTEROLEMIA 12/10/2006  . Essential hypertension 12/10/2006  . Osteoporosis 03/18/2002   Past Medical History:  Diagnosis Date  . Arthritis    "hips" (08/07/2014)  . CAD (coronary artery disease)    a. 1989 s/p MI, TPA, PTCA;  b. 06/2014 NSTEMI/Cath: LM nl, LAD 30-40, D2 90, small, LCX 170m OM1 90, RCA Cs2+ 70-80p, 50-671m90d (? ruptured plaque), EF 55-60%-->Med Rx.  . Chronic diastolic CHF (congestive heart failure) ([place]   a. 07/2014 Echo: EF 60-65%, Gr 1 DD, mild to mod TR, PASP 3772m.  . Diverticulosis   . Elevated LFTs    a. 06/2014 Abd U/S: no GB pathology.  . GMarland KitchenRD (gastroesophageal reflux disease)   . Hx of adenomatous colonic polyps 2010   last colonscopy 2010,Dr EllVira Agarno need for further  colonoscopy  given age  . Hypercalcemia   . Hyperlipidemia   . Hypertension   . Myocardial infarction ([place]) 1989  . Osteoporosis   . PAF/PAFlutter    a. 06/2014->amio and coumadin started.  . Type II diabetes mellitus ([place])   . Wears dentures    partial lower  . Zoster    past   Past Surgical History:  Procedure Laterality Date  . CARDIAC CATHETERIZATION  07/22/2014  . CATARACT EXTRACTION W/PHACO Right 03/18/2018   Procedure: CATARACT EXTRACTION PHACO AND INTRAOCULAR LENS PLACEMENT ([place]) RIGHT DIABETES;  Surgeon: Eulogio Bear, MD;  Location: [facility];  Service: Ophthalmology;  Laterality:  Right;  Diabetes - oral meds  . CORONARY ANGIOPLASTY  1989   "@ [facility]"  . LEFT HEART CATHETERIZATION WITH CORONARY ANGIOGRAM N/A 07/22/2014   Procedure: LEFT HEART CATHETERIZATION WITH CORONARY ANGIOGRAM;  Surgeon: Sinclair Grooms, MD;  Location: [facility] CATH LAB;  Service: Cardiovascular;  Laterality: N/A;  . SHOULDER OPEN ROTATOR CUFF REPAIR Right 1980's  . VAGINAL HYSTERECTOMY  1970's?   ovaries intact   Social History   Tobacco Use  . Smoking status: Former Smoker    Packs/day: 0.25    Years: 30.00    Pack years: 7.50    Types: Cigarettes  . Smokeless tobacco: Never Used  . Tobacco comment: "quit smoking in the 1980's"  Substance Use Topics  . Alcohol use: No    Alcohol/week: 0.0 standard drinks  . Drug use: No   Family History  Problem Relation Age of Onset  . Heart disease Mother        CAD  . Heart disease Father        CAD  . Heart disease Brother        CAD   Allergies  Allergen Reactions  . Alendronate Sodium Other (See Comments)    unknown  . Doxycycline Other (See Comments)    Inc LFT  . Isosorbide Nitrate Other (See Comments)    Doesn't remember reaction  . Lidocaine Nausea Only    Given at Dentist office  . Metformin And Related     Decreases appetite resulting in weight loss  . Metronidazole Nausea And Vomiting    REACTION: sick/ GI  . Nifedipine Other (See Comments)    Doesn't remember reaction   Current Outpatient Medications on File Prior to Visit  Medication Sig Dispense Refill  . ACCU-CHEK FASTCLIX LANCETS MISC Use as instructed to test blood sugar once daily or as needed.  Diagnosis:  E11.9   Non insulin-dependent. 102 each 4  . ALPRAZolam (XANAX) 0.25 MG tablet Take 0.25 mg by mouth as needed.    Marland Kitchen amiodarone (PACERONE) 200 MG tablet Take 1 tablet (200 mg total) by mouth daily. 90 tablet 3  . Blood Glucose Monitoring Suppl (ACCU-CHEK AVIVA PLUS) W/DEVICE KIT Use as instructed to test blood sugar once daily or as needed.  Diagnosis:  E11.9  Non  insulin-dependent. 1 kit 0  . busPIRone (BUSPAR) 30 MG tablet TAKE 1 TABLET TWICE DAILY 180 tablet 0  . cholecalciferol (VITAMIN D) 1000 UNITS tablet Take 2,000 Units by mouth daily at 12 noon.     . clonazePAM (KLONOPIN) 0.5 MG tablet TAKE ONE TABLET BY MOUTH AT BEDTIME. 90 tablet 1  . cloNIDine (CATAPRES) 0.2 MG tablet TAKE 1 TABLET TWICE DAILY 180 tablet 1  . docusate sodium 100 MG CAPS Take 100 mg by mouth 2 (two) times daily. (Patient taking differently: Take 100 mg by mouth 3 (  three) times daily. ) 10 capsule 0  . doxazosin (CARDURA) 4 MG tablet TAKE 1 TABLET TWICE DAILY 180 tablet 0  . glipiZIDE (GLUCOTROL XL) 2.5 MG 24 hr tablet TAKE 1 TABLET DAILY WITH LUNCH. 90 tablet 1  . glucose blood (ACCU-CHEK AVIVA PLUS) test strip USE AS INSTRUCTED TO CHECK BLOOD SUGAR ONCE DAILY OR AS NEEDED.  Dx code E11.59 100 each 3  . hydrALAZINE (APRESOLINE) 100 MG tablet TAKE 1 TABLET THREE TIMES DAILY 270 tablet 1  . levothyroxine (SYNTHROID) 25 MCG tablet TAKE 1 TABLET EVERY DAY BEFORE BREAKFAST 90 tablet 0  . lisinopril (ZESTRIL) 20 MG tablet TAKE 1 TABLET EVERY DAY 90 tablet 1  . nitroGLYCERIN (NITROSTAT) 0.4 MG SL tablet Place 1 tablet (0.4 mg total) under the tongue every 5 (five) minutesas needed for chest pain. If not improved seek medical attention in ER 25 tablet 1  . omeprazole (PRILOSEC) 20 MG capsule TAKE 1 CAPSULE (20 MG TOTAL) BY MOUTH DAILY. 90 capsule 1  . ondansetron (ZOFRAN) 4 MG tablet Take 1 tablet (4 mg total) by mouth every 8 (eight) hours as needed for nausea or vomiting. 20 tablet 1  . sertraline (ZOLOFT) 50 MG tablet Take 1 tablet (50 mg total) by mouth daily. 90 tablet 1  . sitaGLIPtin (JANUVIA) 100 MG tablet TAKE 1 TABLET (100 MG TOTAL) BY MOUTH DAILY. 90 tablet 3  . warfarin (COUMADIN) 3 MG tablet TALE 1 TABLET EVERY DAY OR TAKE AS DIRECTED BY ANTI COAGULATION CLINIC. 90 tablet 1  . furosemide (LASIX) 40 MG tablet Take 1 tablet (40 mg total) by mouth daily. 90 tablet 3  .  potassium chloride (K-DUR) 10 MEQ tablet Take 1 tablet (10 mEq total) by mouth daily. 90 tablet 3   No current facility-administered medications on file prior to visit.      Review of Systems  Constitutional: Positive for fatigue. Negative for activity change, appetite change, fever and unexpected weight change.  HENT: Negative for congestion, ear pain, rhinorrhea, sinus pressure and sore throat.   Eyes: Negative for pain, redness and visual disturbance.  Respiratory: Negative for cough, shortness of breath and wheezing.   Cardiovascular: Negative for chest pain and palpitations.  Gastrointestinal: Negative for abdominal pain, blood in stool, constipation and diarrhea.  Endocrine: Negative for polydipsia and polyuria.  Genitourinary: Negative for dysuria, frequency and urgency.  Musculoskeletal: Positive for arthralgias, back pain and gait problem. Negative for myalgias.  Skin: Negative for pallor and rash.  Allergic/Immunologic: Negative for environmental allergies.  Neurological: Positive for weakness. Negative for dizziness, syncope, numbness and headaches.       Poor balance Generalized weakness  Hematological: Negative for adenopathy. Does not bruise/bleed easily.  Psychiatric/Behavioral: Positive for sleep disturbance. Negative for agitation, decreased concentration and dysphoric mood. The patient is not nervous/anxious.        Dementia with short term memory loss       Objective:   Physical Exam Constitutional:      General: She is not in acute distress.    Appearance: Normal appearance. She is normal weight.     Comments: Frail appearing elderly female in wheelchair   HENT:     Head: Normocephalic and atraumatic.     Ears:     Comments: Hearing loss baseline    Mouth/Throat:     Mouth: Mucous membranes are moist.  Eyes:     General: No scleral icterus.    Extraocular Movements: Extraocular movements intact.     Conjunctiva/sclera: Conjunctivae  normal.     Pupils:  Pupils are equal, round, and reactive to light.  Neck:     Musculoskeletal: Normal range of motion. No muscular tenderness.     Vascular: No carotid bruit.  Cardiovascular:     Rate and Rhythm: Normal rate and regular rhythm.     Heart sounds: Normal heart sounds.  Pulmonary:     Effort: Pulmonary effort is normal. No respiratory distress.     Breath sounds: No wheezing or rales.  Abdominal:     General: Abdomen is flat. Bowel sounds are normal. There is no distension.     Palpations: Abdomen is soft. There is no mass.     Tenderness: There is no abdominal tenderness.  Musculoskeletal:     Right lower leg: No edema.     Left lower leg: No edema.     Comments: No edema today   Kyphosis   Joint changes of OA  Limited rom of trunk and limbs due to generalized weakness Can rise from chair with arms with assistance  Cannot walk today without assistance and assist device    Lymphadenopathy:     Cervical: No cervical adenopathy.  Skin:    General: Skin is warm and dry.     Coloration: Skin is not pale.     Findings: No erythema or rash.     Comments: Healing pressure ulcer on R lateral malleolus  Scab present  Neurological:     Mental Status: She is alert. Mental status is at baseline.     Cranial Nerves: No cranial nerve deficit.     Motor: Weakness present.     Coordination: Coordination abnormal.     Gait: Gait abnormal.  Psychiatric:        Mood and Affect: Mood normal.     Comments: Pleasant mood Answers some questions  Dementia apparent           Assessment & Plan:   Problem List Items Addressed This Visit      Cardiovascular and Mediastinum   Essential hypertension    bp in fair control at this time  BP Readings from Last 1 Encounters:  06/04/19 122/74   No changes needed Most recent labs reviewed  Disc lifstyle change with low sodium diet and exercise  Lab today      Chronic diastolic CHF (congestive heart failure) (HCC)    Stable  Causes ankle  swelling during exacerbation  Pt requires way to elevate feet when sleeping  Hospital bed ordered       Coronary artery disease of native artery of native heart with stable angina pectoris ([place])    Stable Followed by cardiology        Endocrine   Diabetes mellitus ([place])    A1C today Small ulcer in R lateral malleolus -healing with scab  Need to monitor      Hypothyroid    TSH today  No clinical changes        Nervous and Auditory   Dementia, senile ([place])    Apt to get up at night and wander w/o assist  Hospital bed with rails will help this  High fall risk        Other   HYPERCHOLESTEROLEMIA    Labs today      PEDAL EDEMA    On/off from CHF Needs leg elevation at night  hosp bed ordered Face to face visit Medically necessary      At high risk for falls - Primary  Needs bed with rails Multiple falls incl from bed  hosp bed ordered      Falls frequently    hosp bed with rails ordered Is medically necessary       Other Visit Diagnoses    Need for influenza vaccination       Relevant Orders   Flu Vaccine QUAD High Dose(Fluad) (Completed)

## 2019-06-04 NOTE — Assessment & Plan Note (Signed)
Labs today

## 2019-06-04 NOTE — Patient Instructions (Signed)
I will finish paperwork for a hospital bed (which is medically necessary)   Labs today  Flu shot today

## 2019-06-04 NOTE — Assessment & Plan Note (Signed)
Apt to get up at night and wander w/o assist  Hospital bed with rails will help this  High fall risk

## 2019-06-04 NOTE — Assessment & Plan Note (Signed)
hosp bed with rails ordered Is medically necessary

## 2019-06-04 NOTE — Assessment & Plan Note (Addendum)
A1C today Small ulcer in R lateral malleolus -healing with scab  Need to monitor

## 2019-06-06 ENCOUNTER — Encounter: Payer: Self-pay | Admitting: Family Medicine

## 2019-06-12 ENCOUNTER — Ambulatory Visit (INDEPENDENT_AMBULATORY_CARE_PROVIDER_SITE_OTHER): Payer: Medicare HMO

## 2019-06-12 DIAGNOSIS — I4892 Unspecified atrial flutter: Secondary | ICD-10-CM

## 2019-06-12 DIAGNOSIS — Z7901 Long term (current) use of anticoagulants: Secondary | ICD-10-CM | POA: Diagnosis not present

## 2019-06-12 DIAGNOSIS — I4891 Unspecified atrial fibrillation: Secondary | ICD-10-CM

## 2019-06-12 LAB — POCT INR: INR: 1.9 — AB (ref 2.0–3.0)

## 2019-06-12 NOTE — Patient Instructions (Signed)
INR today 1.9  Take 1.5 pills (4.5mg ) today 9/24 and then resume prior dosing of 3mg  daily EXCEPT for 1.5mg  on Fridays and 0mg  on Mondays.  Pt is now on home monitoring.  Dosing instructions given to daughter, [name].  Recheck in 2 weeks.

## 2019-06-20 DIAGNOSIS — G509 Disorder of trigeminal nerve, unspecified: Secondary | ICD-10-CM | POA: Diagnosis not present

## 2019-06-26 ENCOUNTER — Telehealth: Payer: Self-pay | Admitting: *Deleted

## 2019-06-26 DIAGNOSIS — F0390 Unspecified dementia without behavioral disturbance: Secondary | ICD-10-CM

## 2019-06-26 DIAGNOSIS — I639 Cerebral infarction, unspecified: Secondary | ICD-10-CM

## 2019-06-26 DIAGNOSIS — I5032 Chronic diastolic (congestive) heart failure: Secondary | ICD-10-CM

## 2019-06-26 NOTE — Telephone Encounter (Signed)
Urgent Hospice Referral called in to [facility] spoke with [place]. Call was transferred to [facility] to give the verbal Hospice order  okay to [place]. Alyse Low will contact the oncall staff to try to get out to the home tonight.Lugene, patients daughter was called and given the information.

## 2019-06-26 NOTE — Telephone Encounter (Signed)
Urgent referral to hospice placed Will route to [facility] Please let Bianca Mueller know

## 2019-06-26 NOTE — Telephone Encounter (Addendum)
Lugene called and said that she thinks pt has had another min-stroke, pt is having slight facial dropping, and confusion and she is very weak she can't stand anymore to even go to the bathroom. Lugene said given pt's heart issues and age she doesn't want to take her to the hospital to be "poked and prod-ed". Lugene is requesting Dr. Glori Bickers put in a hospice eval asap.  CB # is [phone]  Since PCP isn't here they want hospice eval asap I will send to PCP and Dr. Damita Dunnings per Lugene's request

## 2019-06-29 LAB — POCT INR: INR: 3.5 — AB (ref 2.0–3.0)

## 2019-06-30 ENCOUNTER — Ambulatory Visit (INDEPENDENT_AMBULATORY_CARE_PROVIDER_SITE_OTHER): Payer: Medicare HMO

## 2019-06-30 DIAGNOSIS — Z7901 Long term (current) use of anticoagulants: Secondary | ICD-10-CM

## 2019-06-30 DIAGNOSIS — I4892 Unspecified atrial flutter: Secondary | ICD-10-CM

## 2019-06-30 DIAGNOSIS — I4891 Unspecified atrial fibrillation: Secondary | ICD-10-CM

## 2019-06-30 NOTE — Patient Instructions (Addendum)
NR today 3.5  *Note: patient was instructed to hold dose on 10/11 and await further instructions once clinic reopens on Monday 10/12.   Discussed patient's care.  She has suffered a CVA last week with residual right sided impairment and increased risk of falls.  Discussed with PCP and with daughter the possibility of taking patient off of warfarin at this point due to initiation of Hospice and comfort care measures.    Daughter is fully aware of risks and pros/cons of continuing coumadin therapy at this point. After discussing at length daughter has decided for [place] to continue mother on a decreased dosing at this point in order to monitor her progress through the week.  If no improvement in her mothers condition or she suffers further decline, daughter would like to move forward with discontinuation of medication therapy.   Patient had dose held yesterday and normally takes 0mg  today.  She will then begin decreased dosing of 3mg  daily EXCEPT for 0mg  on Mondays, and 1.5 on Wednesday and Friday and we will reevaluate her in 1 week.  Daughter will keep me updated on the progress with her mother and especially if her swallowing becomes an issue.

## 2019-07-08 ENCOUNTER — Telehealth: Payer: Self-pay | Admitting: Family Medicine

## 2019-07-08 NOTE — Telephone Encounter (Signed)
fmla paperwork in dr tower's in box

## 2019-07-08 NOTE — Telephone Encounter (Signed)
Question #13 -what does she want [place] to specify for frequency and duration ?   Back in IN box signed

## 2019-07-09 ENCOUNTER — Other Ambulatory Visit: Payer: Self-pay | Admitting: Family Medicine

## 2019-07-09 DIAGNOSIS — I480 Paroxysmal atrial fibrillation: Secondary | ICD-10-CM | POA: Diagnosis not present

## 2019-07-09 DIAGNOSIS — Z7901 Long term (current) use of anticoagulants: Secondary | ICD-10-CM | POA: Diagnosis not present

## 2019-07-09 NOTE — Telephone Encounter (Signed)
Left message asking lugene to call office

## 2019-07-10 ENCOUNTER — Encounter: Payer: Self-pay | Admitting: Family Medicine

## 2019-07-10 ENCOUNTER — Ambulatory Visit (INDEPENDENT_AMBULATORY_CARE_PROVIDER_SITE_OTHER): Payer: Medicare HMO | Admitting: General Practice

## 2019-07-10 DIAGNOSIS — Z7901 Long term (current) use of anticoagulants: Secondary | ICD-10-CM | POA: Diagnosis not present

## 2019-07-10 DIAGNOSIS — I4891 Unspecified atrial fibrillation: Secondary | ICD-10-CM

## 2019-07-10 LAB — POCT INR: INR: 2.2 (ref 2.0–3.0)

## 2019-07-10 NOTE — Telephone Encounter (Signed)
Paperwork faxed °

## 2019-07-10 NOTE — Patient Instructions (Signed)
Pre visit review using our clinic review tool, if applicable. No additional management support is needed unless otherwise documented below in the visit note.  Continue to take 3mg  daily EXCEPT for 0mg  on Mondays, and 1.5 on Wednesday and Friday and we will reevaluate her in 1 week.  Daughter will keep me updated on the progress with her mother and especially if her swallowing becomes an issue.

## 2019-07-14 NOTE — Telephone Encounter (Signed)
Copy for pt °Copy for scan °

## 2019-07-16 ENCOUNTER — Telehealth: Payer: Self-pay

## 2019-07-16 NOTE — Telephone Encounter (Signed)
Eliseo Squires with [facility] left v/m requesting med update, pts last virtual or in office visit with any med changes.

## 2019-07-17 NOTE — Telephone Encounter (Signed)
Left VM requesting [name] to call me back

## 2019-07-18 NOTE — Telephone Encounter (Signed)
Please send last office note and utd med list Thanks

## 2019-07-18 NOTE — Telephone Encounter (Signed)
Spoke with Trish Fountain, at this point she was just asking for last visit note. She was asking if patient had a visit since her stroke which she has not just messages/calls. Trish Fountain needs last encounter note faxed and any medication changes/list fax number [phone] Attention Trish Fountain at [facility] services.

## 2019-07-18 NOTE — Telephone Encounter (Signed)
Last OV note and Med list faxed to Trish Fountain

## 2019-07-21 DIAGNOSIS — G510 Bell's palsy: Secondary | ICD-10-CM | POA: Diagnosis not present

## 2019-07-21 DIAGNOSIS — G509 Disorder of trigeminal nerve, unspecified: Secondary | ICD-10-CM | POA: Diagnosis not present

## 2019-07-23 ENCOUNTER — Ambulatory Visit (INDEPENDENT_AMBULATORY_CARE_PROVIDER_SITE_OTHER): Payer: Medicare HMO

## 2019-07-23 DIAGNOSIS — I4891 Unspecified atrial fibrillation: Secondary | ICD-10-CM

## 2019-07-23 DIAGNOSIS — I4892 Unspecified atrial flutter: Secondary | ICD-10-CM

## 2019-07-23 DIAGNOSIS — Z7901 Long term (current) use of anticoagulants: Secondary | ICD-10-CM

## 2019-07-23 LAB — POCT INR: INR: 2.3 (ref 2.0–3.0)

## 2019-07-29 NOTE — Patient Instructions (Signed)
Notified daughter [name] to continue to take 3mg  daily EXCEPT for 0mg  on Mondays, and 1.5 on Wednesday and Friday and we will reevaluate her in 2 weeks.  Daughter will keep me updated on the progress with her mother and especially if her swallowing becomes an issue.

## 2019-08-07 ENCOUNTER — Encounter: Payer: Self-pay | Admitting: Family Medicine

## 2019-08-08 MED ORDER — CLONAZEPAM 0.5 MG PO TABS: 0.5000 mg | ORAL_TABLET | Freq: Every day | ORAL | 0 refills | Status: AC

## 2019-08-21 ENCOUNTER — Telehealth: Payer: Self-pay

## 2019-08-21 DIAGNOSIS — G509 Disorder of trigeminal nerve, unspecified: Secondary | ICD-10-CM | POA: Diagnosis not present

## 2019-08-21 NOTE — Telephone Encounter (Signed)
Please fax office note- I think that may have everything they need- if not /let me know

## 2019-08-21 NOTE — Telephone Encounter (Signed)
Trish Fountain with [facility], called and left message on triage line, asking to get a call back to discuss updated medications, updated diagnoses for the patient. She does quarterly report on the patient and needed these updates. Trish Fountain said we can also just fax this information over instead and she will also need last office note from the last 45 days. Fax number is [phone]: Trish Fountain with [employer]. Her CB is [phone].

## 2019-08-22 ENCOUNTER — Other Ambulatory Visit: Payer: Self-pay | Admitting: Family Medicine

## 2019-08-22 NOTE — Telephone Encounter (Signed)
Snapshot with last OV note attached to it was faxed

## 2019-08-28 ENCOUNTER — Ambulatory Visit (INDEPENDENT_AMBULATORY_CARE_PROVIDER_SITE_OTHER): Payer: Medicare HMO | Admitting: General Practice

## 2019-08-28 DIAGNOSIS — Z7901 Long term (current) use of anticoagulants: Secondary | ICD-10-CM | POA: Diagnosis not present

## 2019-08-28 LAB — POCT INR: INR: 3.6 — AB (ref 2.0–3.0)

## 2019-08-28 NOTE — Patient Instructions (Addendum)
Pre visit review using our clinic review tool, if applicable. No additional management support is needed unless otherwise documented below in the visit note.  Notified daughter [name] to continue to Skip dosage today (12/10) and then continue to take 3mg  daily EXCEPT for 0mg  on Mondays, and 1.5 on Wednesday and Friday and re-check in 1 week.  Daughter will keep me updated on the progress with her mother and especially if her swallowing becomes an issue.

## 2019-08-31 ENCOUNTER — Other Ambulatory Visit: Payer: Self-pay | Admitting: Family Medicine

## 2019-08-31 DIAGNOSIS — I10 Essential (primary) hypertension: Secondary | ICD-10-CM

## 2019-09-03 DIAGNOSIS — I480 Paroxysmal atrial fibrillation: Secondary | ICD-10-CM | POA: Diagnosis not present

## 2019-09-03 DIAGNOSIS — Z7901 Long term (current) use of anticoagulants: Secondary | ICD-10-CM | POA: Diagnosis not present

## 2019-09-04 ENCOUNTER — Telehealth: Payer: Self-pay | Admitting: Family Medicine

## 2019-09-04 ENCOUNTER — Ambulatory Visit (INDEPENDENT_AMBULATORY_CARE_PROVIDER_SITE_OTHER): Payer: Medicare HMO | Admitting: General Practice

## 2019-09-04 DIAGNOSIS — Z7901 Long term (current) use of anticoagulants: Secondary | ICD-10-CM | POA: Diagnosis not present

## 2019-09-04 LAB — POCT INR: INR: 2 (ref 2.0–3.0)

## 2019-09-04 NOTE — Patient Instructions (Signed)
Pre visit review using our clinic review tool, if applicable. No additional management support is needed unless otherwise documented below in the visit note.  Notified daughter [name] to continue to continue to take 3 mg daily EXCEPT for 0mg  on Mondays, and 1.5 on Wednesday and Friday and re-check on Wednesday, 12/30.  Daughter will keep me updated on the progress with her mother and especially if her swallowing becomes an issue.

## 2019-09-04 NOTE — Telephone Encounter (Signed)
Received INR report from [facility]   INR on 09/03/19 is 2.0  (in range)   Will forward to [place] for advisement

## 2019-09-05 NOTE — Telephone Encounter (Signed)
Noted, this INR reading was addressed by Villa Herb, RN coumadin clinic on 09/04/19.   Thanks.

## 2019-09-10 ENCOUNTER — Other Ambulatory Visit: Payer: Self-pay | Admitting: Family Medicine

## 2019-09-19 DIAGNOSIS — G509 Disorder of trigeminal nerve, unspecified: Secondary | ICD-10-CM | POA: Diagnosis not present

## 2019-09-21 ENCOUNTER — Other Ambulatory Visit: Payer: Self-pay | Admitting: Family Medicine

## 2019-09-23 ENCOUNTER — Encounter: Payer: Self-pay | Admitting: Family Medicine

## 2019-09-24 ENCOUNTER — Ambulatory Visit (INDEPENDENT_AMBULATORY_CARE_PROVIDER_SITE_OTHER): Payer: Medicare Other

## 2019-09-24 DIAGNOSIS — Z7901 Long term (current) use of anticoagulants: Secondary | ICD-10-CM

## 2019-09-24 DIAGNOSIS — I4891 Unspecified atrial fibrillation: Secondary | ICD-10-CM

## 2019-09-24 DIAGNOSIS — I4892 Unspecified atrial flutter: Secondary | ICD-10-CM

## 2019-09-24 LAB — POCT INR: INR: 8 — AB (ref 2.0–3.0)

## 2019-09-24 NOTE — Progress Notes (Signed)
Noted  

## 2019-09-24 NOTE — Patient Instructions (Signed)
INR : 8.0 on in home monitoring.   Notified daughter [name] to [place] immediately.  Hold until further notice and recheck patient's INR at home on Friday, 09/26/19 in 2 days before going in to weekend.    Daughter denies any changes other than mother continues with failure to thrive, not eating and is on comfort measures in the home.  She denies any signs of bleeding or bruising or other complications.  Knows to go to the hospital if anything concerning that they don't want to support in home given that she is on end of life care support.  Daughter is willing to reconsider stopping coumadin all together and this point.  Will discuss further with daughter tomorrow when she comes in to the office.  For now plans to stop Warfarin and recheck INR on Friday and discuss d/cin'g anticoag therapy all together at this point.    PCP is aware and in agreement with plan, nothing further at this point.

## 2019-09-24 NOTE — Progress Notes (Signed)
John with [facility] left v/m that pts INR was 8.0 today. From this note it appears Licensed conveyancer has already been notified and Lugene, pts daughter has also been notified. FYI to [employer].

## 2019-09-30 ENCOUNTER — Telehealth: Payer: Self-pay | Admitting: *Deleted

## 2019-10-03 ENCOUNTER — Other Ambulatory Visit: Payer: Self-pay | Admitting: Family Medicine

## 2019-10-13 ENCOUNTER — Ambulatory Visit: Payer: Self-pay

## 2019-10-20 NOTE — Telephone Encounter (Signed)
Sad day- I spoke to [place] and will check in with Lugene soon  She will be missed

## 2019-10-20 NOTE — Telephone Encounter (Signed)
Clarise Cruz nurse with [facility] left a voicemail stating that patient passed away today at 10:44 am. Clarise Cruz stated that she just wanted to let Dr. Glori Bickers know.

## 2019-10-20 DEATH — deceased
